# Patient Record
Sex: Female | Born: 1935 | Race: White | Hispanic: No | Smoking: Never smoker
Health system: Southern US, Community
[De-identification: ages and names within clinical notes are randomized; demographics above are authoritative.]

## PROBLEM LIST (undated history)

## (undated) DIAGNOSIS — H409 Unspecified glaucoma: Secondary | ICD-10-CM

## (undated) DIAGNOSIS — E119 Type 2 diabetes mellitus without complications: Secondary | ICD-10-CM

## (undated) DIAGNOSIS — C801 Malignant (primary) neoplasm, unspecified: Secondary | ICD-10-CM

## (undated) DIAGNOSIS — F419 Anxiety disorder, unspecified: Secondary | ICD-10-CM

## (undated) DIAGNOSIS — I10 Essential (primary) hypertension: Secondary | ICD-10-CM

## (undated) DIAGNOSIS — E0590 Thyrotoxicosis, unspecified without thyrotoxic crisis or storm: Secondary | ICD-10-CM

## (undated) DIAGNOSIS — E785 Hyperlipidemia, unspecified: Secondary | ICD-10-CM

## (undated) HISTORY — DX: Unspecified glaucoma: H40.9

## (undated) HISTORY — DX: Type 2 diabetes mellitus without complications: E11.9

## (undated) HISTORY — DX: Anxiety disorder, unspecified: F41.9

## (undated) HISTORY — DX: Thyrotoxicosis, unspecified without thyrotoxic crisis or storm: E05.90

## (undated) HISTORY — DX: Hyperlipidemia, unspecified: E78.5

## (undated) HISTORY — DX: Essential (primary) hypertension: I10

---

## 1974-11-18 HISTORY — PX: CHOLECYSTECTOMY: SHX55

## 1993-11-18 HISTORY — PX: ABDOMINAL HYSTERECTOMY: SHX81

## 1997-11-18 HISTORY — PX: ROTATOR CUFF REPAIR: SHX139

## 1998-03-29 ENCOUNTER — Other Ambulatory Visit: Admission: RE | Admit: 1998-03-29 | Discharge: 1998-03-29 | Payer: Self-pay | Admitting: Radiology

## 1998-04-20 ENCOUNTER — Other Ambulatory Visit: Admission: RE | Admit: 1998-04-20 | Discharge: 1998-04-20 | Payer: Self-pay | Admitting: Cardiology

## 1999-01-15 ENCOUNTER — Other Ambulatory Visit: Admission: RE | Admit: 1999-01-15 | Discharge: 1999-01-15 | Payer: Self-pay | Admitting: Obstetrics and Gynecology

## 1999-01-17 ENCOUNTER — Other Ambulatory Visit: Admission: RE | Admit: 1999-01-17 | Discharge: 1999-01-17 | Payer: Self-pay | Admitting: Obstetrics and Gynecology

## 1999-02-02 ENCOUNTER — Encounter: Payer: Self-pay | Admitting: Obstetrics and Gynecology

## 1999-02-06 ENCOUNTER — Inpatient Hospital Stay (HOSPITAL_COMMUNITY): Admission: RE | Admit: 1999-02-06 | Discharge: 1999-02-08 | Payer: Self-pay | Admitting: Obstetrics and Gynecology

## 2001-03-05 ENCOUNTER — Encounter: Payer: Self-pay | Admitting: Orthopedic Surgery

## 2001-03-12 ENCOUNTER — Inpatient Hospital Stay (HOSPITAL_COMMUNITY): Admission: RE | Admit: 2001-03-12 | Discharge: 2001-03-13 | Payer: Self-pay | Admitting: Orthopedic Surgery

## 2010-09-05 ENCOUNTER — Ambulatory Visit: Payer: Self-pay | Admitting: Cardiology

## 2010-10-18 ENCOUNTER — Encounter
Admission: RE | Admit: 2010-10-18 | Discharge: 2010-12-18 | Payer: Self-pay | Source: Home / Self Care | Attending: Cardiology | Admitting: Cardiology

## 2010-12-06 ENCOUNTER — Ambulatory Visit: Payer: Self-pay | Admitting: Cardiology

## 2010-12-13 ENCOUNTER — Ambulatory Visit
Admission: RE | Admit: 2010-12-13 | Discharge: 2010-12-13 | Payer: Self-pay | Source: Home / Self Care | Attending: Family Medicine | Admitting: Family Medicine

## 2010-12-13 ENCOUNTER — Other Ambulatory Visit: Payer: Self-pay | Admitting: Family Medicine

## 2010-12-13 DIAGNOSIS — I10 Essential (primary) hypertension: Secondary | ICD-10-CM | POA: Insufficient documentation

## 2010-12-13 DIAGNOSIS — F411 Generalized anxiety disorder: Secondary | ICD-10-CM | POA: Insufficient documentation

## 2010-12-13 DIAGNOSIS — M109 Gout, unspecified: Secondary | ICD-10-CM | POA: Insufficient documentation

## 2010-12-13 DIAGNOSIS — E039 Hypothyroidism, unspecified: Secondary | ICD-10-CM | POA: Insufficient documentation

## 2010-12-13 DIAGNOSIS — E119 Type 2 diabetes mellitus without complications: Secondary | ICD-10-CM | POA: Insufficient documentation

## 2010-12-14 LAB — BASIC METABOLIC PANEL
BUN: 28 mg/dL — ABNORMAL HIGH (ref 6–23)
CO2: 28 mEq/L (ref 19–32)
Calcium: 10.2 mg/dL (ref 8.4–10.5)
Chloride: 98 mEq/L (ref 96–112)
Creatinine, Ser: 1.4 mg/dL — ABNORMAL HIGH (ref 0.4–1.2)
GFR: 36.99 mL/min — ABNORMAL LOW (ref >60.00)
Glucose, Bld: 128 mg/dL — ABNORMAL HIGH (ref 70–99)
Potassium: 4.6 mEq/L (ref 3.5–5.1)
Sodium: 136 mEq/L (ref 135–145)

## 2010-12-14 LAB — HEMOGLOBIN A1C: Hgb A1c MFr Bld: 7.2 % — ABNORMAL HIGH (ref 4.6–6.5)

## 2010-12-14 LAB — TSH: TSH: 0.31 u[IU]/mL — ABNORMAL LOW (ref 0.35–5.50)

## 2010-12-17 ENCOUNTER — Telehealth (INDEPENDENT_AMBULATORY_CARE_PROVIDER_SITE_OTHER): Payer: Self-pay | Admitting: *Deleted

## 2010-12-17 LAB — T3, FREE: T3, Free: 2.5 pg/mL (ref 2.3–4.2)

## 2010-12-17 LAB — T4, FREE: Free T4: 1 ng/dL (ref 0.60–1.60)

## 2010-12-26 NOTE — Progress Notes (Signed)
Summary: labs  Phone Note Outgoing Call   Call placed by: Malachi Bonds CMA,  December 17, 2010 2:55 PM Call placed to: Patient Summary of Call: despite abnormal TSH, T3/T4 are normal.  no changes to meds at this time.  A1C is elevated.  should decrease Amaryl (Glimiperide) to 2 mg and add Januvia 50mg  daily to better control sugars (we have a free 30 day voucher)  labs show mild kidney problems- not uncommon w/ age.  will follow.  Follow-up for Phone Call        spoke w/ patient aware of labs but stated that she was taking 2 tablets of glimepiride so informed patient to take 1 tablet and will mail copy of labs also mail discount card for patient to have and prescription sent to pharmacy ...........Marland KitchenMalachi Bonds CMA  December 17, 2010 3:01 PM     New/Updated Medications: JANUVIA 50 MG TABS (SITAGLIPTIN PHOSPHATE) take one tablet daily Prescriptions: JANUVIA 50 MG TABS (SITAGLIPTIN PHOSPHATE) take one tablet daily  #30 x 3   Entered by:   Malachi Bonds CMA   Authorized by:   Annye Asa MD   Signed by:   Malachi Bonds CMA on 12/17/2010   Method used:   Electronically to        [facility] (retail)       [address]       [place], [place]  [address]       Ph: [phone]       Fax: [phone]   RxID:   [id number]

## 2010-12-26 NOTE — Assessment & Plan Note (Signed)
Summary: new to est//medicare//lch   Vital Signs:  Patient profile:   77 year old female Height:      66 inches Weight:      182 pounds BMI:     29.48 Pulse rate:   68 / minute BP sitting:   120 / 72  (left arm)  Vitals Entered By: Malachi Bonds CMA (December 13, 2010 3:04 PM) CC: NEW EST- problems w/ nerves    History of Present Illness: 77 yo woman here today to establish care.  previous MD- Mare Ferrari, still serves as cardiologist.  HTN- well controlled on current meds.  no CP, SOB, HAs, visual changes, edema.  DM- Dr Mare Ferrari told pt to 'watch sugars' and then nurse told pt she might need insulin.  pt has been very worried about this.  has seen a nutritionist, was given a diet.  is on Glimiperide 2 pills daily.  pt reports CBGs fluctuate considerably.  does admit to waking up dizzy and weak.  has been motivated to follow her diet, has lost 16 lbs.  Anxiety- daughter recently died, very anxious.  has been worried about her blood sugar.  not taking Alprazolam three times a day- only taking at night.  reports it makes her 'too drugged'.  would like to know if there is another option.  Hypothyroid- on levothyroxine.  Hyperlipidemia- on Atorvastatin, no abd pain/N/V/D  Preventive Screening-Counseling & Management  Caffeine-Diet-Exercise     Diet Comments: ADA diet  Current Medications (verified): 1)  Allopurinol 100 Mg Tabs (Allopurinol) .... Take One Tablet Daily 2)  Acebutolol Hcl 400 Mg Caps (Acebutolol Hcl) .... Take One Tablet Daily 3)  Amlodipine Besylate 5 Mg Tabs (Amlodipine Besylate) .... Take One Tablet Daily 4)  Atorvastatin Calcium 10 Mg Tabs (Atorvastatin Calcium) .... Take One Tablet Daily 5)  Glimepiride 4 Mg Tabs (Glimepiride) .... Take One Tablet Daily 6)  Levothyroxine Sodium 100 Mcg Tabs (Levothyroxine Sodium) .... Take One Tablet Daily 7)  Metolazone 2.5 Mg Tabs (Metolazone) .... 1/2 Every 3 Days 8)  Micardis Hct 80-12.5 Mg Tabs (Telmisartan-Hctz) .... Take  One Tablet Daily 9)  Alprazolam 0.5 Mg Tabs (Alprazolam) .... Take One Tablet Three Times A Day 10)  Klonopin 0.5 Mg Tabs (Clonazepam) .[name].. 1 Tab By Mouth Three Times A Day For Anxiety  Allergies (verified): No Known Drug Allergies  Past History:  Past Medical History: Diabetes mellitus, type II Gout Hyperlipidemia Hypertension Hyperthyroidism Anxiety  Past Surgical History: Cholecystectomy Hysterectomy R rotator cuff surgery  Family History: CAD-no HTN-no DM-no STROKE-no COLON CA-no BREAST CA-no  Social History: widowed. daughter helps pt considerably  Review of Systems      See HPI  Physical Exam  General:  Well-developed,well-nourished,in no acute distress; alert,appropriate and cooperative throughout examination Head:  Normocephalic and atraumatic without obvious abnormalities. No apparent alopecia or balding. Ears:  hard of hearing Neck:  No deformities, masses, or tenderness noted. Lungs:  Normal respiratory effort, chest expands symmetrically. Lungs are clear to auscultation, no crackles or wheezes. Heart:  distant S1/S2, regular, no murmur appreciated Abdomen:  soft, NT/ND, +BS Pulses:  +2 carotid, radial, DP Extremities:  nonpitting edema of LEs bilaterally Neurologic:  alert & oriented X3 and cranial nerves II-XII intact.   Skin:  turgor normal and color normal.   Cervical Nodes:  No lymphadenopathy noted Psych:  Cognition and judgment appear intact. Alert and cooperative with normal attention span and concentration. No apparent delusions, illusions, hallucinations   Impression & Recommendations:  Problem # 1:  DIABETES MELLITUS, TYPE II (ICD-250.00) Assessment New will need to review records from previous MD.  check labs to assess A1C.  may need to modify meds based on pt's report of fluctuating CBGs and occasional symptomatic lows.  UTD on eye exam.  on ARB.  lipid goal is <70.  pt not fasting today. Her updated medication list for this problem  includes:    Glimepiride 4 Mg Tabs (Glimepiride) .[name]... Take one tablet daily    Micardis Hct 80-12.5 Mg Tabs (Telmisartan-hctz) .[name]... Take one tablet daily  Problem # 2:  HYPERTENSION (ICD-401.9) Assessment: New well controlled on current meds.  asymptomatic at this time. Her updated medication list for this problem includes:    Acebutolol Hcl 400 Mg Caps (Acebutolol hcl) .[name]... Take one tablet daily    Amlodipine Besylate 5 Mg Tabs (Amlodipine besylate) .[name]... Take one tablet daily    Metolazone 2.5 Mg Tabs (Metolazone) .[name]... 1/2 every 3 days    Micardis Hct 80-12.5 Mg Tabs (Telmisartan-hctz) .[name]... Take one tablet daily  Problem # 3:  ANXIETY (ICD-300.00) Assessment: New since xanax makes pt feel 'too drugged' will switch to longer acting klonopin three times a day to control anxiety.  reviewed possible side effects of meds including confusion, falls- pt and daughter aware. Her updated medication list for this problem includes:    Alprazolam 0.5 Mg Tabs (Alprazolam) .[name]... Take one tablet three times a day    Klonopin 0.5 Mg Tabs (Clonazepam) .[name]... 1 tab by mouth three times a day for anxiety  Problem # 4:  HYPOTHYROIDISM (ICD-244.9) Assessment: New due for labs.  adjust meds as needed. Her updated medication list for this problem includes:    Levothyroxine Sodium 100 Mcg Tabs (Levothyroxine sodium) .[name]... Take one tablet daily  Orders: Specimen Handling (99000) TLB-TSH (Thyroid Stimulating Hormone) (84443-TSH)  Problem # 5:  HYPERLIPIDEMIA (ICD-272.4) Assessment: New will need records from previous MD.  goal is < 70 given DM.  not fasting today. Her updated medication list for this problem includes:    Atorvastatin Calcium 10 Mg Tabs (Atorvastatin calcium) .[name]... Take one tablet daily  Complete Medication List: 1)  Allopurinol 100 Mg Tabs (Allopurinol) .... Take one tablet daily 2)  Acebutolol Hcl 400 Mg Caps (Acebutolol hcl) .... Take one tablet daily 3)  Amlodipine Besylate 5 Mg  Tabs (Amlodipine besylate) .... Take one tablet daily 4)  Atorvastatin Calcium 10 Mg Tabs (Atorvastatin calcium) .... Take one tablet daily 5)  Glimepiride 4 Mg Tabs (Glimepiride) .... Take one tablet daily 6)  Levothyroxine Sodium 100 Mcg Tabs (Levothyroxine sodium) .... Take one tablet daily 7)  Metolazone 2.5 Mg Tabs (Metolazone) .... 1/2 every 3 days 8)  Micardis Hct 80-12.5 Mg Tabs (Telmisartan-hctz) .... Take one tablet daily 9)  Alprazolam 0.5 Mg Tabs (Alprazolam) .... Take one tablet three times a day 10)  Klonopin 0.5 Mg Tabs (Clonazepam) .[name].. 1 tab by mouth three times a day for anxiety  Other Orders: Venipuncture [id number]) TLB-A1C / Hgb A1C (Glycohemoglobin) (83036-A1C) TLB-BMP (Basic Metabolic Panel-BMET) (99991111)  Patient Instructions: 1)  Follow up in 3 months to recheck diabetes 2)  We'll notify you of your lab results and make any changes to meds if needed 3)  Call with any questions or concerns 4)  Start the Klonopin three times a day for anxiety 5)  Welcome!  We're glad to have you! Prescriptions: KLONOPIN 0.5 MG TABS (CLONAZEPAM) 1 tab by mouth three times a day for anxiety  #90 x 3  Entered and Authorized by:   Annye Asa MD   Signed by:   Annye Asa MD on 12/13/2010   Method used:   Print then Give to Patient   RxID:   [id number]    Orders Added: 1)  Venipuncture [id number] 2)  Specimen Handling [id number])  TLB-A1C / Hgb A1C (Glycohemoglobin) [83036-A1C] 4)  TLB-BMP (Basic Metabolic Panel-BMET) [id number] 5)  TLB-TSH (Thyroid Stimulating Hormone) [84443-TSH] 6)  New Patient Level III [id number]

## 2011-03-05 ENCOUNTER — Encounter: Payer: Self-pay | Admitting: Family Medicine

## 2011-03-14 ENCOUNTER — Encounter: Payer: Self-pay | Admitting: Family Medicine

## 2011-03-14 ENCOUNTER — Ambulatory Visit (INDEPENDENT_AMBULATORY_CARE_PROVIDER_SITE_OTHER): Payer: Medicare Other | Admitting: Family Medicine

## 2011-03-14 DIAGNOSIS — I10 Essential (primary) hypertension: Secondary | ICD-10-CM

## 2011-03-14 DIAGNOSIS — E785 Hyperlipidemia, unspecified: Secondary | ICD-10-CM

## 2011-03-14 DIAGNOSIS — E039 Hypothyroidism, unspecified: Secondary | ICD-10-CM

## 2011-03-14 DIAGNOSIS — E119 Type 2 diabetes mellitus without complications: Secondary | ICD-10-CM

## 2011-03-14 LAB — BASIC METABOLIC PANEL
BUN: 21 mg/dL (ref 6–23)
Chloride: 98 mEq/L (ref 96–112)
Creatinine, Ser: 1.4 mg/dL — ABNORMAL HIGH (ref 0.4–1.2)
GFR: 37.58 mL/min — ABNORMAL LOW (ref >60.00)
Glucose, Bld: 156 mg/dL — ABNORMAL HIGH (ref 70–99)

## 2011-03-14 LAB — TSH: TSH: 0.22 u[IU]/mL — ABNORMAL LOW (ref 0.35–5.50)

## 2011-03-14 LAB — HEPATIC FUNCTION PANEL
ALT: 17 U/L (ref 0–35)
Bilirubin, Direct: 0.1 mg/dL (ref 0.0–0.3)
Total Bilirubin: 1 mg/dL (ref 0.3–1.2)

## 2011-03-14 LAB — T3, FREE: T3, Free: 2.6 pg/mL (ref 2.3–4.2)

## 2011-03-14 LAB — LIPID PANEL
Total CHOL/HDL Ratio: 3
VLDL: 25.6 mg/dL (ref 0.0–40.0)

## 2011-03-14 LAB — T4, FREE: Free T4: 1.07 ng/dL (ref 0.60–1.60)

## 2011-03-14 MED ORDER — SITAGLIPTIN PHOSPHATE 50 MG PO TABS: 50.0000 mg | ORAL_TABLET | Freq: Every day | ORAL | Status: DC

## 2011-03-14 NOTE — Progress Notes (Signed)
  Subjective:    Patient ID: [name], female    DOB: 1919-02-18, 77 y.o.   MRN: [id number]  HPI DM- reports CBGs have 'come down, that med works good'.  Taking sugars once daily- fasting CBGs ranging 100-110s.  Fasting #s used to be 160-170s.  Following 30 carb diet from nutritionist.  Denies symptomatic lows  HTN- BP well controlled today, no CP, SOB, HAs, visual changes, edema.  Hypothyroid- TSH was abnormal previously but T3 and T4 were good.  Denies sxs.  Due for lab recheck today.   Review of Systems For ROS see HPI     Objective:   Physical Exam  Constitutional: She is oriented to person, place, and time. She appears well-developed and well-nourished. No distress.  HENT:  Head: Normocephalic and atraumatic.  Eyes: Conjunctivae and EOM are normal. Pupils are equal, round, and reactive to light.  Neck: Normal range of motion. Neck supple. No thyromegaly present.  Cardiovascular: Normal rate, regular rhythm, normal heart sounds and intact distal pulses.   No murmur heard. Pulmonary/Chest: Effort normal and breath sounds normal. No respiratory distress.  Abdominal: Soft. She exhibits no distension. There is no tenderness.  Musculoskeletal: She exhibits no edema.  Lymphadenopathy:    She has no cervical adenopathy.  Neurological: She is alert and oriented to person, place, and time.  Skin: Skin is warm and dry.  Psychiatric: She has a normal mood and affect. Her behavior is normal.          Assessment & Plan:

## 2011-03-14 NOTE — Patient Instructions (Signed)
Follow up in 4 months to recheck diabetes and blood pressure We'll notify you of your lab results Keep up the good work- you look fantastic! Call with any questions or concerns Have a great spring!

## 2011-03-18 ENCOUNTER — Other Ambulatory Visit (INDEPENDENT_AMBULATORY_CARE_PROVIDER_SITE_OTHER): Payer: Medicare Other

## 2011-03-18 DIAGNOSIS — E119 Type 2 diabetes mellitus without complications: Secondary | ICD-10-CM

## 2011-03-24 NOTE — Assessment & Plan Note (Signed)
Due for labs today.  Tolerating statin w/out difficulty.  Adjust meds prn.

## 2011-03-24 NOTE — Assessment & Plan Note (Signed)
BP well controlled.  Asymptomatic.  Doing well on current meds.  No changes.

## 2011-03-24 NOTE — Assessment & Plan Note (Signed)
Pt's TSH was abnormal at last check, but T3/T4 normal.  Due for recheck.

## 2011-03-24 NOTE — Assessment & Plan Note (Signed)
Pt is doing well since starting Januvia, reports CBGs have improved.  Due for labs today.  Applauded her attention to diet and exercise.  Will adjust meds prn.

## 2011-04-05 NOTE — H&P (Signed)
[facility]  Patient:    [name], [name]                          MRN: [id number] Adm. Date:  03/12/01 Attending:  Kipp Brood. Gladstone Lighter, M.D. Dictator:   Jenetta Loges, P.A. CC:         Joyice Faster. Mare Ferrari, M.D.   History and Physical  DATE OF HISTORY AND PHYSICAL:  March 05, 2001.  CHIEF COMPLAINT:  Right shoulder pain.  HISTORY OF PRESENT ILLNESS:  [name] is a very pleasant 77 year old female who has had chronic ongoing right shoulder pain.  She has failed conservative measures and was sent for MRI which demonstrated a full-thickness rotator cuff tear.  Patient is having significant disability to her shoulder.  She has noted weakness on exam with inability to completely abduct her arm.  With these findings, it is explained that surgical intervention is indicated for repair of this.  Risks and benefits were discussed with patient and she is in agreement and wishes to proceed.  Patient did see her primary physician, Dr. Joyice Faster. [place], for medical clearance.  PAST MEDICAL HISTORY: 1. Hypertension. 2. Hypothyroidism. 3. Gout. 4. Borderline diabetes. 5. Hypercholesterolemia.  PAST SURGICAL HISTORY: 1. Cholecystectomy. 2. Hysterectomy.  MEDICATIONS: 1. Acebutolol 400 mg q.d. 2. Zaroxolyn 25 mg q.d. 3. Levothyroxine 25 mg q.d. 4. Spironolactone 25 mg q.d. 5. Allopurinol 100 mg q.d. 6. Lipitor 20 mg q.d. 7. Premarin 0.25 mg q.d. 8. Cozaar 50 mg q.d. 9. Glucotrol XL 5 mg one-half q.d.  ALLERGIES:  No known drug allergies.  SOCIAL HISTORY:  She does not drink nor smoke.  She is left-hand dominant. Lives with her husband and her daughter lives next door that will assist her postoperatively.  FAMILY HISTORY:  Family history negative for diabetes, cancers, heart attacks.  REVIEW OF SYSTEMS:  Patient denies any recent fevers, chills, night sweats. No bleeding tendency.  CNS:  No blurred or double vision, seizures, headache or paralysis.   RESPIRATORY:  No shortness of breath, productive cough or hemoptysis.  CARDIOVASCULAR:  No chest pain, angina or orthopnea.  GI:  No nausea, vomiting, constipation, melena or bloody stools.  GU:  No dysuria or hematuria.  MUSCULOSKELETAL:  As pertinent to present illness.  PHYSICAL EXAMINATION:  GENERAL:  Well-developed, well-nourished 77 year old female.  VITAL SIGNS:  Blood pressure is 152/84, pulse 88 and regular, respirations 14.  HEENT:  Normocephalic.  Extraocular motions intact.  NECK:  Neck supple.  No lymphadenopathy.  No carotid bruits.  CHEST:  Chest clear to auscultation bilaterally.  No rales or rhonchi.  HEART:  Heart is regular rate and rhythm.  No murmurs, gallops, rubs, heaves or thrills.  ABDOMEN:  Abdomen soft and nontender.  No hepatosplenomegaly.  EXTREMITIES:  Right shoulder has a decreased active motion.  She is unable to completely abduct the arm.  Neurovascularly, she is intact.  IMPRESSION: 1. Right rotator cuff tear. 2. Hypertension. 3. Hypothyroidism. 4. Gout. 5. Hypercholesterolemia. 6. Borderline diabetes.  PLAN:  Patient will be admitted for repair of right rotator cuff repair.  On preadmission labs, her BUN and creatinine were elevated at 30 and 1.9.  I called Dr. Bobbye Riggs office and back in February, she was 24 and 1.5.  They will follow up with her on this. DD:  03/06/01 TD:  03/09/01 Job: [id number]

## 2011-04-05 NOTE — Op Note (Signed)
. [facility]  Patient:    [name], [name]                   MRN: [id number] Proc. Date: 03/12/01 Adm. Date:  [id number] Attending:  Ferrel Logan                           Operative Report  PREOPERATIVE DIAGNOSIS: 1. Partial rotator cuff tendon tear on the right. 2. Severe impingement syndrome on the right.  POSTOPERATIVE DIAGNOSIS: 1. Partial rotator cuff tendon tear on the right. 2. Severe impingement syndrome on the right.  OPERATION PERFORMED: 1. Partial acromionectomy and acromioplasty on the right. 2. Repair of a small irregularity of the rotator cuff tendon on the right    utilizing transverse sutures. 3. Subdeltoid bursectomy.  SURGEON:  Kipp Brood. Gladstone Lighter, M.D.  ASSISTANT:  Peter Congo, P.A.  ANESTHESIA:  General.  DESCRIPTION OF PROCEDURE:  Under general anesthesia, routine orthopedic prepping and draping of the right shoulder was carried out.  The patient had 1 gm of IV Ancef.  An incision was made over the anterior aspect of the right shoulder.  Bleeders were identified and cauterized.  At this time the self-retaining retractors were inserted.  I then detached the deltoid tendon by sharp dissection from the acromion.  I then split the deltoid muscle proximally, went down, identified the acromion, she had a large overhanging acromion that literally was imbedding into her rotator cuff.  I protected the cuff with a Bennett retractor, utilized the oscillating saw and did a partial acromionectomy.  I then utilized the bur to bur down the undersurface of the acromion.  We now had a nice open space for motion of the shoulder.  I thoroughly irrigated out the area.  I then put the few transverse sutures in her rotator cuff which was markedly thin and irregular at its attachment.  No anchors were necessary.  We thoroughly irrigated out the area.  I bone waxed the raw bone end of the acromion and then I inserted some Gelfoam  for hemostasis purposes up under the acromion.  I then reattached the deltoid tendon and muscle in the usual fashion.  The remaining part of the wound was closed in the usual fashion.  The skin was closed with metal staples and sterile Neosporin dressing was applied.  She was then placed in a shoulder immobilizer. DD:  03/12/01 TD:  03/12/01 Job: [id number]

## 2011-07-02 ENCOUNTER — Ambulatory Visit (INDEPENDENT_AMBULATORY_CARE_PROVIDER_SITE_OTHER): Payer: Medicare Other | Admitting: Family Medicine

## 2011-07-02 ENCOUNTER — Encounter: Payer: Self-pay | Admitting: Family Medicine

## 2011-07-02 DIAGNOSIS — E119 Type 2 diabetes mellitus without complications: Secondary | ICD-10-CM

## 2011-07-02 DIAGNOSIS — I10 Essential (primary) hypertension: Secondary | ICD-10-CM

## 2011-07-02 LAB — HEMOGLOBIN A1C: Hgb A1c MFr Bld: 5.9 % (ref 4.6–6.5)

## 2011-07-02 LAB — BASIC METABOLIC PANEL
BUN: 20 mg/dL (ref 6–23)
Creatinine, Ser: 1.3 mg/dL — ABNORMAL HIGH (ref 0.4–1.2)
GFR: 39.51 mL/min — ABNORMAL LOW (ref >60.00)
Glucose, Bld: 145 mg/dL — ABNORMAL HIGH (ref 70–99)

## 2011-07-02 NOTE — Assessment & Plan Note (Signed)
Per pt report this is excellently controlled.  Check A1C to assess.  Tolerating current meds w/out difficulty- will adjust prn.

## 2011-07-02 NOTE — Patient Instructions (Signed)
Schedule your complete physical in 4 months- do not eat before this appt We'll notify you of your lab results Keep up the good work!  You look great! Call with any questions or concerns Hang in there!!!

## 2011-07-02 NOTE — Progress Notes (Signed)
  Subjective:    Patient ID: [name], female    DOB: 02-Sep-1919, 77 y.o.   MRN: [id number]  HPI DM- chronic problem for pt, CBGs have been good- 'rarely go over 120'.  On Januvia, amaryl.  Denies symptomatic lows.  No numbness or tingling of hands or feet.  No N/V/D.  HTN- chronic problem for pt, excellent control today.  On Norvasc, Micardis.  No CP, SOB, HAs, visual changes, edema.   Review of Systems For ROS see HPI     Objective:   Physical Exam  Vitals reviewed. Constitutional: She is oriented to person, place, and time. She appears well-developed and well-nourished. No distress.  HENT:  Head: Normocephalic and atraumatic.  Eyes: Conjunctivae and EOM are normal. Pupils are equal, round, and reactive to light.  Neck: Normal range of motion. Neck supple.  Cardiovascular: Normal rate, regular rhythm, normal heart sounds and intact distal pulses.   No murmur heard. Pulmonary/Chest: Effort normal and breath sounds normal. No respiratory distress.  Abdominal: Soft. She exhibits no distension. There is no tenderness.  Musculoskeletal: She exhibits no edema.  Lymphadenopathy:    She has no cervical adenopathy.  Neurological: She is alert and oriented to person, place, and time.  Skin: Skin is warm and dry.  Psychiatric: She has a normal mood and affect. Her behavior is normal.          Assessment & Plan:

## 2011-07-02 NOTE — Assessment & Plan Note (Signed)
Excellent control- asymptomatic.  Check labs.  No changes.

## 2011-07-03 ENCOUNTER — Other Ambulatory Visit: Payer: Self-pay | Admitting: Family Medicine

## 2011-07-03 ENCOUNTER — Telehealth: Payer: Self-pay | Admitting: *Deleted

## 2011-07-03 ENCOUNTER — Telehealth: Payer: Self-pay

## 2011-07-03 DIAGNOSIS — E875 Hyperkalemia: Secondary | ICD-10-CM

## 2011-07-03 NOTE — Telephone Encounter (Signed)
Error

## 2011-07-03 NOTE — Telephone Encounter (Signed)
Pt notified and verbalized understanding. Lab order and appointment placed.

## 2011-07-03 NOTE — Telephone Encounter (Signed)
Message copied by Santiago Bumpers on Wed Jul 03, 2011  2:43 PM ------      Message from: Midge Minium      Created: Tue Jul 02, 2011  7:36 PM       A1C is excellent!  K+ is mildly elevated- should increase water intake, limit K+ in diet (bananas, citrus fruits, leafy greens) and recheck on Thurs (dx hyperkalemia).  Remainder of labs are stable

## 2011-07-04 ENCOUNTER — Telehealth: Payer: Self-pay

## 2011-07-04 ENCOUNTER — Other Ambulatory Visit (INDEPENDENT_AMBULATORY_CARE_PROVIDER_SITE_OTHER): Payer: Medicare Other

## 2011-07-04 DIAGNOSIS — E875 Hyperkalemia: Secondary | ICD-10-CM

## 2011-07-04 NOTE — Telephone Encounter (Signed)
Message copied by Santiago Bumpers on Thu Jul 04, 2011  3:53 PM ------      Message from: Midge Minium      Created: Thu Jul 04, 2011  3:10 PM       K+ is now normal

## 2011-07-04 NOTE — Telephone Encounter (Signed)
Lab mailed

## 2011-07-04 NOTE — Progress Notes (Signed)
Labs only

## 2011-07-08 ENCOUNTER — Other Ambulatory Visit: Payer: Self-pay | Admitting: Family Medicine

## 2011-07-09 NOTE — Telephone Encounter (Signed)
Last ov 07/02/11. Last filled 04/08/11

## 2011-07-10 ENCOUNTER — Other Ambulatory Visit: Payer: Self-pay | Admitting: Family Medicine

## 2011-07-10 NOTE — Telephone Encounter (Signed)
Rx verbally given, per pharmacy Rx not received

## 2011-07-10 NOTE — Telephone Encounter (Signed)
Duplicate done [id number]

## 2011-09-24 ENCOUNTER — Telehealth: Payer: Self-pay | Admitting: Family Medicine

## 2011-09-24 MED ORDER — TELMISARTAN-HCTZ 80-12.5 MG PO TABS: 1.0000 | ORAL_TABLET | Freq: Every day | ORAL | Status: DC

## 2011-09-24 NOTE — Telephone Encounter (Signed)
Printed rx for micardis for MD to sign to be mailed to pt per request.

## 2011-09-24 NOTE — Telephone Encounter (Signed)
Patient needs refill for micardis 80/12.5 mg 90 day supply - mail rx to patient she will mail to pharmacy

## 2011-09-25 ENCOUNTER — Other Ambulatory Visit: Payer: Self-pay | Admitting: *Deleted

## 2011-09-25 NOTE — Telephone Encounter (Signed)
MD signed Micardis medication and mailed 09-25-11 to home address

## 2011-09-26 ENCOUNTER — Other Ambulatory Visit: Payer: Self-pay

## 2011-10-01 ENCOUNTER — Other Ambulatory Visit: Payer: Self-pay | Admitting: Radiology

## 2011-10-01 DIAGNOSIS — C50911 Malignant neoplasm of unspecified site of right female breast: Secondary | ICD-10-CM

## 2011-10-02 ENCOUNTER — Encounter: Payer: Self-pay | Admitting: Family Medicine

## 2011-10-03 ENCOUNTER — Other Ambulatory Visit: Payer: Self-pay | Admitting: *Deleted

## 2011-10-03 ENCOUNTER — Telehealth: Payer: Self-pay | Admitting: *Deleted

## 2011-10-03 DIAGNOSIS — C50919 Malignant neoplasm of unspecified site of unspecified female breast: Secondary | ICD-10-CM

## 2011-10-03 NOTE — Telephone Encounter (Signed)
Confirmed [facility] for 10/09/11 at 1215 .  Instructions and contact information given.

## 2011-10-07 ENCOUNTER — Ambulatory Visit
Admission: RE | Admit: 2011-10-07 | Discharge: 2011-10-07 | Disposition: A | Discharge status: Home / Self Care | Payer: Medicare Other | Source: Ambulatory Visit | Attending: Radiology | Admitting: Radiology

## 2011-10-07 DIAGNOSIS — C50911 Malignant neoplasm of unspecified site of right female breast: Secondary | ICD-10-CM

## 2011-10-07 MED ORDER — GADOBENATE DIMEGLUMINE 529 MG/ML IV SOLN
14.0000 mL | Freq: Once | INTRAVENOUS | Status: AC | PRN
  Administered 2011-10-07: 14 mL via INTRAVENOUS

## 2011-10-09 ENCOUNTER — Telehealth: Payer: Self-pay | Admitting: Oncology

## 2011-10-09 ENCOUNTER — Encounter (INDEPENDENT_AMBULATORY_CARE_PROVIDER_SITE_OTHER): Payer: Self-pay | Admitting: Surgery

## 2011-10-09 ENCOUNTER — Ambulatory Visit: Payer: Medicare Other | Admitting: Physical Therapy

## 2011-10-09 ENCOUNTER — Other Ambulatory Visit (HOSPITAL_BASED_OUTPATIENT_CLINIC_OR_DEPARTMENT_OTHER): Payer: Medicare Other | Admitting: Lab

## 2011-10-09 ENCOUNTER — Ambulatory Visit (HOSPITAL_BASED_OUTPATIENT_CLINIC_OR_DEPARTMENT_OTHER): Payer: Medicare Other | Admitting: Surgery

## 2011-10-09 ENCOUNTER — Ambulatory Visit
Admission: RE | Admit: 2011-10-09 | Discharge: 2011-10-09 | Disposition: A | Discharge status: Home / Self Care | Payer: Medicare Other | Source: Ambulatory Visit | Attending: Radiation Oncology | Admitting: Radiation Oncology

## 2011-10-09 ENCOUNTER — Ambulatory Visit (HOSPITAL_BASED_OUTPATIENT_CLINIC_OR_DEPARTMENT_OTHER): Payer: Medicare Other | Admitting: Oncology

## 2011-10-09 ENCOUNTER — Ambulatory Visit: Payer: Medicare Other

## 2011-10-09 VITALS — BP 152/77 | HR 69

## 2011-10-09 VITALS — BP 152/77 | HR 69 | Temp 97.9°F | Ht 65.5 in | Wt 157.1 lb

## 2011-10-09 DIAGNOSIS — C50911 Malignant neoplasm of unspecified site of right female breast: Secondary | ICD-10-CM

## 2011-10-09 DIAGNOSIS — Z170 Estrogen receptor positive status [ER+]: Secondary | ICD-10-CM

## 2011-10-09 DIAGNOSIS — C50919 Malignant neoplasm of unspecified site of unspecified female breast: Secondary | ICD-10-CM

## 2011-10-09 DIAGNOSIS — C50119 Malignant neoplasm of central portion of unspecified female breast: Secondary | ICD-10-CM

## 2011-10-09 DIAGNOSIS — C50811 Malignant neoplasm of overlapping sites of right female breast: Secondary | ICD-10-CM | POA: Insufficient documentation

## 2011-10-09 LAB — COMPREHENSIVE METABOLIC PANEL
ALT: 14 U/L (ref 0–35)
AST: 18 U/L (ref 0–37)
Albumin: 4 g/dL (ref 3.5–5.2)
Alkaline Phosphatase: 77 U/L (ref 39–117)
Calcium: 10.5 mg/dL (ref 8.4–10.5)
Chloride: 101 mEq/L (ref 96–112)
Creatinine, Ser: 1.26 mg/dL — ABNORMAL HIGH (ref 0.50–1.10)
Potassium: 3.7 mEq/L (ref 3.5–5.3)

## 2011-10-09 LAB — CBC WITH DIFFERENTIAL/PLATELET
Eosinophils Absolute: 0.1 10*3/uL (ref 0.0–0.5)
MCV: 95.6 fL (ref 79.5–101.0)
MONO#: 0.5 10*3/uL (ref 0.1–0.9)
MONO%: 6.8 % (ref 0.0–14.0)
NEUT#: 4.7 10*3/uL (ref 1.5–6.5)
RBC: 4.08 10*6/uL (ref 3.70–5.45)
RDW: 12.9 % (ref 11.2–14.5)
WBC: 6.8 10*3/uL (ref 3.9–10.3)

## 2011-10-09 LAB — CANCER ANTIGEN 27.29: CA 27.29: 26 U/mL (ref 0–39)

## 2011-10-09 NOTE — Telephone Encounter (Signed)
Gv pt appt for [id number]

## 2011-10-09 NOTE — Progress Notes (Signed)
Patient ID: [name], female   DOB: 1919/10/22, 77 y.o.   MRN: [id number]  Chief Complaint  Patient presents with  . Other    right breast cancer    HPI [name] is a 77 y.o. female.   HPI She is referred here by Dr. Elige Radon for evaluation of a recent right breast mass which was biopsied and proven to be a malignancy. She has no complaints regarding her breast. She denies nipple discharge. She reports having had a biopsy of the right breast and maybe more than 30 years ago for benign disease. She is otherwise without complaints. Past Medical History  Diagnosis Date  . Diabetes mellitus, type 2   . Gout   . Hyperlipidemia   . Hypertension   . Hyperthyroidism   . Anxiety     Past Surgical History  Procedure Date  . Cholecystectomy   . Rotator cuff repair     right  . Abdominal hysterectomy     History reviewed. No pertinent family history.  Social History History  Substance Use Topics  . Smoking status: Never Smoker   . Smokeless tobacco: Not on file  . Alcohol Use: Not on file    No Known Allergies  Current Outpatient Prescriptions  Medication Sig Dispense Refill  . acebutolol (SECTRAL) 400 MG capsule Take 400 mg by mouth daily.        [name] allopurinol (ZYLOPRIM) 100 MG tablet Take 100 mg by mouth daily.        [name] ALPRAZolam (XANAX) 0.5 MG tablet Take 0.5 mg by mouth at bedtime.       [name] amLODipine (NORVASC) 5 MG tablet Take 5 mg by mouth daily.        [name] atorvastatin (LIPITOR) 10 MG tablet Take 10 mg by mouth daily.        . clonazePAM (KLONOPIN) 0.5 MG tablet TAKE ONE TABLET BY MOUTH THREE TIMES DAILY FOR ANXIETY  90 tablet  3  . glimepiride (AMARYL) 4 MG tablet Take 4 mg by mouth daily.        [name] levothyroxine (SYNTHROID, LEVOTHROID) 100 MCG tablet Take 100 mcg by mouth daily.        . metolazone (ZAROXOLYN) 2.5 MG tablet Take 2.5 mg by mouth as directed. 0.5 tabs po every 3 days       . sitaGLIPtan (JANUVIA) 50 MG tablet Take 1 tablet (50 mg  total) by mouth daily.  90 tablet  3  . telmisartan-hydrochlorothiazide (MICARDIS HCT) 80-12.5 MG per tablet Take 1 tablet by mouth daily.  90 tablet  1    Review of Systems Review of Systems  Constitutional: Negative for fever, chills and unexpected weight change.  HENT: Negative for hearing loss, congestion, sore throat, trouble swallowing and voice change.   Eyes: Negative for visual disturbance.  Respiratory: Negative for cough and wheezing.   Cardiovascular: Negative for chest pain, palpitations and leg swelling.  Gastrointestinal: Negative for nausea, vomiting, abdominal pain, diarrhea, constipation, blood in stool, abdominal distention and anal bleeding.  Genitourinary: Negative for hematuria, vaginal bleeding and difficulty urinating.  Musculoskeletal: Positive for back pain. Negative for arthralgias.  Skin: Negative for rash and wound.  Neurological: Negative for seizures, syncope and headaches.  Hematological: Negative for adenopathy. Does not bruise/bleed easily.  Psychiatric/Behavioral: Negative for confusion.    Blood pressure 152/77, pulse 69.  Physical Exam Physical Exam  Constitutional: She is oriented to person, place, and time. She appears well-developed and well-nourished. No distress.  HENT:  Head: Normocephalic and atraumatic.  Right Ear: External ear normal.  Left Ear: External ear normal.  Nose: Nose normal.  Mouth/Throat: Oropharynx is clear and moist. No oropharyngeal exudate.  Eyes: Conjunctivae are normal. Pupils are equal, round, and reactive to light. No scleral icterus.  Neck: Normal range of motion. Neck supple. No tracheal deviation present. No thyromegaly present.  Cardiovascular: Normal rate, regular rhythm, normal heart sounds and intact distal pulses.   No murmur heard. Pulmonary/Chest: Effort normal and breath sounds normal. No respiratory distress. She has no wheezes. She has no rales.  Lymphadenopathy:    She has no cervical adenopathy.    Neurological: She is alert and oriented to person, place, and time.  Skin: Skin is warm and dry. No erythema. No pallor.  Psychiatric: Her behavior is normal. Judgment normal.  Breasts are examined. There is no axillary adenopathy on either side. There is no supraclavicular adenopathy or cervical adenopathy on either side. She has a 3 cm mass in the lower-outer quadrant of the right breast. There is moderate ecchymosis from her breast biopsy. The mass is deep but mobile.  Data Reviewed I have copies of her MRI and mammogram and ultrasound. There is a decrease in weight or mass in the right breast. Biopsy results showed invasive mammary carcinoma. She is strongly ER and PR positive.  Assessment    This is a patient with right breast cancer. We discussed her in the breast cancer clinic. I discussed a lumpectomy versus mastectomy with her. She reports that she will keep her breast and had just a lumpectomy performed. Other recommendations are for any hormonal therapy postoperatively only. There are no plans for radiation therapy.    Plan    After long discussion, she wishes to proceed with distal lumpectomy of the right breast. I discussed the risk of the procedure which includes but is not limited to bleeding, infection, need for further surgery if margins are positive, etc. She understands and wishes to proceed. Surgery will be scheduled. Likelihood of success is good.       Diron Haddon A 10/09/2011, 1:39 PM

## 2011-10-09 NOTE — Progress Notes (Addendum)
[name]    HPI: [name] is a 77 year old [place] woman presenting to the multidisciplinary breast cancer clinic today for evaluation and treatment in the setting of newly diagnosed breast cancer.   She had routine screening mammography 09/26/2011 at [facility] showing a possible abnormality in the left mid breast. She was recalled for additional views November 12, and underwent bilateral diagnostic mammography and right ultrasonography. This showed an irregular ill-defined low-density mass in the right breast without associated microcalcifications. On ultrasound this was heterogeneous, predominantly hypoechoic, with ill-defined borders, and measured 1.9 cm. Biopsy of the mass was performed the same day, and showed ([id number]) an invasive ductal breast cancer, e-cadherin positive, grade 2, estrogen receptor 83% and progesterone receptor 94% positive, with an MIB-1-1 of 90%, but no HER-2 amplification by [facility].  With this information the patient was set up for bilateral breast MRI, performed November 19. This showed a 3 cm area of irregular confluent non-masslike enhancement in the outer central right breast. There were no other suspicious areas in either breast and no evidence of adenopathy in the axillae or internal mammary areas. The patient's case was discussed this morning at the multidisciplinary breast cancer conference and a preliminary plan was suggested namely starting with surgery, considering anti-estrogens, and likely foregoing radiation.  Past Medical History  Diagnosis Date  . Diabetes mellitus, type 2   . Gout   . Hyperlipidemia   . Hypertension   . Hyperthyroidism   . Anxiety     Past Surgical History  Procedure Date  . Cholecystectomy   . Rotator cuff repair     right  . Abdominal hysterectomy     FAMILY HISTORY: The patient's father died from a stroke at the age of 46 the patient's mother died at age 71 possibly from complications of bladder cancer. The  patient had 2 brothers and 2 sisters. There is no evidence of breast or ovarian cancer in the family.  Gynecologic history: Menarche age 72, menopause age 75. She never took hormone replacement. She is GX P2, first live birth age 74  Social History: The patient is a widow and lives by herself. Her daughter [name] lives across the street with her own family. [name] is a homemaker as is the patient's (Ms. Creger only worked outside of the home when there was a [place] during [place] II). The patient has 3 grandchildren and 3 great-grandchildren she attends [employer].  Health maintenance: She  reports that she has never smoked. She does not have any smokeless tobacco history on file. She does not drink alcoholic beverages.   Cholesterol unknown  Bone density never  Colonoscopy never  (PAP) Not since hysterectomy  Allergies: No Known Allergies      ROS she had no symptoms in particular at the time of her mammogram and was not aware that there was a mass in her breast, which indeed is not palpable. She thinks she needs a hearing aid and she has some arthritis. She has had gout on 2 occasions but very remotely, about 25 years ago she had some glucose intolerance which has been treated with diet only. Otherwise a detailed review of systems was noncontributory. Note that this patient just passed herr driving test last October and still drives herself to church in the grocery store on a regular basis.  Physical Exam:  Blood pressure 152/77, pulse 69, temperature 97.9 F (36.6 C), temperature source Oral, height 5' 5.5" (1.664 m), weight  157 lb 1.6 oz (71.26 kg).  Sclerae unicteric Oropharynx clear No peripheral adenopathy Lungs no rales or rhonchi Heart regular rate and rhythm Abd benign MSK no focal spinal tenderness, no peripheral edema Neuro: nonfocal Breasts: Right breast status post recent biopsy with some ecchymosis, but no palpable mass,  no significant skin change, no nipple retraction. Left breast unremarkable.  LABS   Results for orders placed in visit on 10/09/11 (from the past 48 hour(s))  CBC WITH DIFFERENTIAL     Status: Normal   Collection Time   10/09/11 12:33 PM      Component Value Range Comment   WBC 6.8  3.9 - 10.3 (10e3/uL)    NEUT# 4.7  1.5 - 6.5 (10e3/uL)    HGB 13.3  11.6 - 15.9 (g/dL)    HCT 39.0  34.8 - 46.6 (%)    Platelets 207  145 - 400 (10e3/uL)    MCV 95.6  79.5 - 101.0 (fL)    MCH 32.5  25.1 - 34.0 (pg)    MCHC 34.0  31.5 - 36.0 (g/dL)    RBC 4.08  3.70 - 5.45 (10e6/uL)    RDW 12.9  11.2 - 14.5 (%)    lymph# 1.5  0.9 - 3.3 (10e3/uL)    MONO# 0.5  0.1 - 0.9 (10e3/uL)    Eosinophils Absolute 0.1  0.0 - 0.5 (10e3/uL)    Basophils Absolute 0.1  0.0 - 0.1 (10e3/uL)    NEUT% 68.9  38.4 - 76.8 (%)    LYMPH% 22.3  14.0 - 49.7 (%)    MONO% 6.8  0.0 - 14.0 (%)    EOS% 1.1  0.0 - 7.0 (%)    BASO% 0.9  0.0 - 2.0 (%)   COMPREHENSIVE METABOLIC PANEL     Status: Abnormal   Collection Time   10/09/11 12:33 PM      Component Value Range Comment   Sodium 140  135 - 145 (mEq/L)    Potassium 3.7  3.5 - 5.3 (mEq/L)    Chloride 101  96 - 112 (mEq/L)    CO2 29  19 - 32 (mEq/L)    Glucose, Bld 115 (*) 70 - 99 (mg/dL)    BUN 22  6 - 23 (mg/dL)    Creatinine, Ser 1.26 (*) 0.50 - 1.10 (mg/dL)    Total Bilirubin 0.5  0.3 - 1.2 (mg/dL)    Alkaline Phosphatase 77  39 - 117 (U/L)    AST 18  0 - 37 (U/L)    ALT 14  0 - 35 (U/L)    Total Protein 7.2  6.0 - 8.3 (g/dL)    Albumin 4.0  3.5 - 5.2 (g/dL)    Calcium 10.5  8.4 - 10.5 (mg/dL)        FILMS: as already discussed     Assessment: 77 year old [place] woman status post right breast biopsy November of 2012 for what clinically is a T2 N0 (steg II) invasive ductal carcinoma, strongly estrogen and progesterone receptor positive, with a very elevated MIB-1, but no HER-2 amplification.  Plan:  Her case was discussed this morning at the multidisciplinary  conference and again this afternoon at the multidisciplinary breast cancer clinic. It is our feeling that the patient would get very little benefit from radiation after lumpectomy, and therefore she will not meet with radiation after today. She may benefit from antiestrogen therapy postoperatively, but by the benefit it may be very limited, and we will make that decision once we know exactly  what the margin status was.  Accordingly the plan is for the patient to proceed to a partial mastectomy under Dr. Ninfa Linden, then return to see me in January, at which time we will make a definitive decision regarding systemic treatment. MAGRINAT,GUSTAV C 10/09/2011, 2:58 PM

## 2011-10-09 NOTE — Patient Instructions (Signed)
[facility] °Office Phone Number [phone] ° °BREAST BIOPSY/ PARTIAL MASTECTOMY: POST OP INSTRUCTIONS ° °Always review your discharge instruction sheet given to you by the facility where your surgery was performed. ° °IF YOU HAVE DISABILITY OR FAMILY LEAVE FORMS, YOU MUST BRING THEM TO THE OFFICE FOR PROCESSING.  DO NOT GIVE THEM TO YOUR DOCTOR. ° °1. A prescription for pain medication may be given to you upon discharge.  Take your pain medication as prescribed, if needed.  If narcotic pain medicine is not needed, then you may take acetaminophen (Tylenol) or ibuprofen (Advil) as needed. °2. Take your usually prescribed medications unless otherwise directed °3. If you need a refill on your pain medication, please contact your pharmacy.  They will contact our office to request authorization.  Prescriptions will not be filled after 5pm or on week-ends. °4. You should eat very light the first 24 hours after surgery, such as soup, crackers, pudding, etc.  Resume your normal diet the day after surgery. °5. Most patients will experience some swelling and bruising in the breast.  Ice packs and a good support bra will help.  Swelling and bruising can take several days to resolve.  °6. It is common to experience some constipation if taking pain medication after surgery.  Increasing fluid intake and taking a stool softener will usually help or prevent this problem from occurring.  A mild laxative (Milk of Magnesia or Miralax) should be taken according to package directions if there are no bowel movements after 48 hours. °7. Unless discharge instructions indicate otherwise, you may remove your bandages 24-48 hours after surgery, and you may shower at that time.  You may have steri-strips (small skin tapes) in place directly over the incision.  These strips should be left on the skin for 7-10 days.  If your surgeon used skin glue on the incision, you may shower in 24 hours.  The glue will flake off over the  next 2-3 weeks.  Any sutures or staples will be removed at the office during your follow-up visit. °8. ACTIVITIES:  You may resume regular daily activities (gradually increasing) beginning the next day.  Wearing a good support bra or sports bra minimizes pain and swelling.  You may have sexual intercourse when it is comfortable. °a. You may drive when you no longer are taking prescription pain medication, you can comfortably wear a seatbelt, and you can safely maneuver your car and apply brakes. °b. RETURN TO WORK:  ______________________________________________________________________________________ °9. You should see your doctor in the office for a follow-up appointment approximately two weeks after your surgery.  Your doctor’s nurse will typically make your follow-up appointment when she calls you with your pathology report.  Expect your pathology report 2-3 business days after your surgery.  You may call to check if you do not hear from us after three days. °10. OTHER INSTRUCTIONS: _______________________________________________________________________________________________ _____________________________________________________________________________________________________________________________________ °_____________________________________________________________________________________________________________________________________ °_____________________________________________________________________________________________________________________________________ ° °WHEN TO CALL YOUR DOCTOR: °1. Fever over 101.0 °2. Nausea and/or vomiting. °3. Extreme swelling or bruising. °4. Continued bleeding from incision. °5. Increased pain, redness, or drainage from the incision. ° °The clinic staff is available to answer your questions during regular business hours.  Please don’t hesitate to call and ask to speak to one of the nurses for clinical concerns.  If you have a medical emergency, go to the nearest  emergency room or call 911.  A surgeon from [facility] is always on call at the hospital. ° °For further questions, please visit [email or web address]  °

## 2011-10-14 ENCOUNTER — Encounter: Payer: Self-pay | Admitting: Family Medicine

## 2011-10-15 ENCOUNTER — Telehealth: Payer: Self-pay | Admitting: *Deleted

## 2011-10-15 NOTE — Progress Notes (Signed)
CC:   Chauncey Cruel, M.D. Coralie Keens, M.D.  DIAGNOSIS:  Breast cancer.  NARRATIVE:  Ms. Belmarez was seen briefly in multidisciplinary breast clinic today.  Her case was discussed at breast conference this morning.  The patient is a 77 year old female with a recent diagnosis of breast cancer.  It was discussed that we felt that surgical resection was recommended for her with the consideration of possible hormonal treatment.  In the event of obtaining negative margins, as we expect to be feasible for her case, I do not anticipate the need for radiation treatment.  I briefly discussed this with the patient and all of her questions were answered.  I would be happy to see her at any time in the future if I could be of any further assistance and if there are any unexpected difficulties with her treatment in terms of obtaining local control.    ______________________________ Jodelle Gross, M.D., Ph.D. JSM/MEDQ  D:  10/13/2011  T:  10/15/2011  Job:  [id number]

## 2011-10-15 NOTE — Telephone Encounter (Signed)
Spoke with pt concerning [facility] from 10/09/11.  Pt denies questions or needs regarding dx, prognostic panel, or treatment care plan.  Encourage pt to call with concerns.  Received verbal understanding.  Contact information given.

## 2011-10-23 ENCOUNTER — Encounter: Payer: Self-pay | Admitting: Family Medicine

## 2011-10-29 ENCOUNTER — Encounter: Payer: Self-pay | Admitting: Family Medicine

## 2011-10-30 ENCOUNTER — Ambulatory Visit (INDEPENDENT_AMBULATORY_CARE_PROVIDER_SITE_OTHER): Payer: Medicare Other | Admitting: Family Medicine

## 2011-10-30 ENCOUNTER — Encounter (HOSPITAL_COMMUNITY): Payer: Self-pay | Admitting: Pharmacy Technician

## 2011-10-30 ENCOUNTER — Encounter: Payer: Self-pay | Admitting: Family Medicine

## 2011-10-30 DIAGNOSIS — R6889 Other general symptoms and signs: Secondary | ICD-10-CM

## 2011-10-30 DIAGNOSIS — E119 Type 2 diabetes mellitus without complications: Secondary | ICD-10-CM

## 2011-10-30 DIAGNOSIS — E785 Hyperlipidemia, unspecified: Secondary | ICD-10-CM

## 2011-10-30 DIAGNOSIS — I10 Essential (primary) hypertension: Secondary | ICD-10-CM

## 2011-10-30 DIAGNOSIS — R7989 Other specified abnormal findings of blood chemistry: Secondary | ICD-10-CM

## 2011-10-30 DIAGNOSIS — Z0000 Encounter for general adult medical examination without abnormal findings: Secondary | ICD-10-CM

## 2011-10-30 LAB — HEPATIC FUNCTION PANEL
ALT: 18 U/L (ref 0–35)
AST: 19 U/L (ref 0–37)
Alkaline Phosphatase: 64 U/L (ref 39–117)
Bilirubin, Direct: 0.1 mg/dL (ref 0.0–0.3)
Total Bilirubin: 0.5 mg/dL (ref 0.3–1.2)
Total Protein: 7.1 g/dL (ref 6.0–8.3)

## 2011-10-30 LAB — CBC WITH DIFFERENTIAL/PLATELET
Basophils Absolute: 0 10*3/uL (ref 0.0–0.1)
Basophils Relative: 0.3 % (ref 0.0–3.0)
Eosinophils Absolute: 0.1 10*3/uL (ref 0.0–0.7)
Hemoglobin: 12.9 g/dL (ref 12.0–15.0)
Lymphocytes Relative: 18.4 % (ref 12.0–46.0)
MCHC: 34.2 g/dL (ref 30.0–36.0)
Monocytes Relative: 6.3 % (ref 3.0–12.0)
Neutrophils Relative %: 73.7 % (ref 43.0–77.0)
RBC: 3.88 Mil/uL (ref 3.87–5.11)
RDW: 13 % (ref 11.5–14.6)

## 2011-10-30 LAB — HEMOGLOBIN A1C: Hgb A1c MFr Bld: 5.8 % (ref 4.6–6.5)

## 2011-10-30 LAB — LIPID PANEL
LDL Cholesterol: 58 mg/dL (ref 0–99)
Total CHOL/HDL Ratio: 3
VLDL: 29.2 mg/dL (ref 0.0–40.0)

## 2011-10-30 LAB — BASIC METABOLIC PANEL
CO2: 29 mEq/L (ref 19–32)
Chloride: 103 mEq/L (ref 96–112)
Potassium: 3.9 mEq/L (ref 3.5–5.1)

## 2011-10-30 NOTE — Progress Notes (Signed)
  Subjective:    Patient ID: [name], female    DOB: 1919-10-06, 77 y.o.   MRN: [id number]  HPI Here today for CPE.  Risk Factors: DM- chronic problem, CBGs ranging 80-120.  On Amaryl, Januvia.  No symptomatic lows w/ exception of day she was raking leaves.  UTD on eye exam. Hyperlipidemia- chronic problem.  On Lipitor.  Denies abd pain, N/V, myalgias HTN- chronic problem, excellent control today.  On acebutolol, norvasc, Micardis HCT, Zaroxolyn.  Denies CP, SOB, HAs, visual changes, edema. Physical Activity: walking regularly Fall Risk: low risk Depression: denies current sxs Hearing: decreased to conversational tones ADL's: independent Cognitive: normal linear thought process, memory and attention intact Home Safety: safe at home, daughter provides considerable help Height, Weight, BMI, Visual Acuity: see vitals, vision corrected to 20/20 w/ glasses Counseling: UTD on mammo (has breast cancer), no need for pap or colonoscopy Labs Ordered: See A&P Care Plan: See A&P    Review of Systems Patient reports no adenopathy, fever, weight change, persistant/recurrent hoarseness , swallowing issues, chest pain, palpitations, edema, persistant/recurrent cough, hemoptysis, dyspnea (rest/exertional/paroxysmal nocturnal), gastrointestinal bleeding (melena, rectal bleeding), abdominal pain, significant heartburn, bowel changes, GU symptoms (dysuria, hematuria, incontinence), Gyn symptoms (abnormal  bleeding, pain),  syncope, focal weakness, memory loss, numbness & tingling, skin/hair/nail changes, abnormal bruising or bleeding, anxiety, or depression.     Objective:   Physical Exam General Appearance:    Alert, cooperative, no distress, appears stated age  Head:    Normocephalic, without obvious abnormality, atraumatic  Eyes:    PERRL, conjunctiva/corneas clear, EOM's intact, fundi    benign, both eyes  Ears:    Normal TM's and external ear canals, both ears  Nose:   Nares normal,  septum midline, mucosa normal, no drainage    or sinus tenderness  Throat:   Lips, mucosa, and tongue normal; teeth and gums normal  Neck:   Supple, symmetrical, trachea midline, no adenopathy;    Thyroid: no enlargement/tenderness/nodules  Back:     Symmetric, no curvature, ROM normal, no CVA tenderness  Lungs:     Clear to auscultation bilaterally, respirations unlabored  Chest Wall:    No tenderness or deformity   Heart:    Regular rate and rhythm, S1 and S2 normal, I/VI SEM  Breast Exam:    Deferred  Abdomen:     Soft, non-tender, bowel sounds active all four quadrants,    no masses, no organomegaly  Genitalia:    Deferred  Rectal:    Extremities:   Extremities normal, atraumatic, no cyanosis or edema  Pulses:   2+ and symmetric all extremities  Skin:   Skin color, texture, turgor normal, no rashes or lesions  Lymph nodes:   Cervical, supraclavicular, and axillary nodes normal  Neurologic:   CNII-XII intact, normal strength, sensation and reflexes    throughout          Assessment & Plan:

## 2011-10-30 NOTE — Patient Instructions (Signed)
Follow up in 6 months to recheck cholesterol, blood pressure, diabetes We'll notify you of your lab results Keep up the good work- you look great! Call with any questions or concerns Good luck with surgery! Happy Holidays!

## 2011-10-31 ENCOUNTER — Encounter (HOSPITAL_COMMUNITY): Payer: Self-pay

## 2011-10-31 ENCOUNTER — Encounter (HOSPITAL_COMMUNITY)
Admission: RE | Admit: 2011-10-31 | Discharge: 2011-10-31 | Disposition: A | Discharge status: Home / Self Care | Payer: Medicare Other | Source: Ambulatory Visit | Attending: Surgery | Admitting: Surgery

## 2011-10-31 ENCOUNTER — Ambulatory Visit (HOSPITAL_COMMUNITY)
Admission: RE | Admit: 2011-10-31 | Discharge: 2011-10-31 | Disposition: A | Discharge status: Home / Self Care | Payer: Medicare Other | Source: Ambulatory Visit | Attending: Surgery | Admitting: Surgery

## 2011-10-31 DIAGNOSIS — M5380 Other specified dorsopathies, site unspecified: Secondary | ICD-10-CM | POA: Insufficient documentation

## 2011-10-31 DIAGNOSIS — Z01812 Encounter for preprocedural laboratory examination: Secondary | ICD-10-CM | POA: Insufficient documentation

## 2011-10-31 DIAGNOSIS — Z01818 Encounter for other preprocedural examination: Secondary | ICD-10-CM | POA: Insufficient documentation

## 2011-10-31 HISTORY — DX: Malignant (primary) neoplasm, unspecified: C80.1

## 2011-10-31 LAB — T4, FREE: Free T4: 1.07 ng/dL (ref 0.60–1.60)

## 2011-10-31 LAB — T3, FREE: T3, Free: 2.3 pg/mL (ref 2.3–4.2)

## 2011-10-31 LAB — SURGICAL PCR SCREEN: Staphylococcus aureus: NEGATIVE

## 2011-10-31 NOTE — Pre-Procedure Instructions (Signed)
CBC WITH DIFF< BMET AND EKG FROM 10/30/11 IN EP[facility]

## 2011-10-31 NOTE — Patient Instructions (Addendum)
[place]  10/31/2011   Your procedure is scheduled on:  11/06/11  Surgery [id number]   [facility]  Report to [place] at 1210 PM.  Call this number if you have problems the morning of surgery: [phone]     PST [id number]              NO SUGAR MEDS AM OF SURGERY  Remember:   Do not eat food:After Midnight.  Night  Tuesday NIGHT  May have clear liquids:up to 6 Hours before arrival until 600 am, then none  Clear liquids include soda, tea, black coffee, apple or grape juice, broth.  Take these medicines the morning of surgery with A SIP OF WATER: Norvasc, Synthyroid with sip water              No aspirin or antiinflammatories 5 days pre op  Do not wear jewelry, make-up or nail polish.  Do not wear lotions, powders, or perfumes. You may wear deodorant.  Do not shave 48 hours prior to surgery.  Do not bring valuables to the hospital.  Contacts, dentures or bridgework may not be worn into surgery.  Leave suitcase in the car. After surgery it may be brought to your room.  For patients admitted to the hospital, checkout time is 11:00 AM the day of discharge.   Patients discharged the day of surgery will not be allowed to drive home.  Name and phone number of your driver: daughter  Special Instructions: CHG Shower Use Special Wash: 1/2 bottle night before surgery and 1/2 bottle morning of surgery.  Regular soap face and privates   Please read over the following fact sheets that you were given: MRSA Information

## 2011-11-04 ENCOUNTER — Encounter: Payer: Self-pay | Admitting: *Deleted

## 2011-11-04 NOTE — Progress Notes (Signed)
Mailed after appt letter to pt. 

## 2011-11-05 NOTE — Assessment & Plan Note (Signed)
Chronic problem.  Well controlled on current meds.  Asymptomatic.  No changes. 

## 2011-11-05 NOTE — Assessment & Plan Note (Signed)
PE WNL.  UTD on health maintenance.  Check labs.  Anticipatory guidance provided.

## 2011-11-05 NOTE — Assessment & Plan Note (Signed)
Chronic problem.  Tolerating statin w/out difficulty.  Check labs.  Adjust meds prn  

## 2011-11-05 NOTE — Assessment & Plan Note (Signed)
Chronic problem, typically well controlled.  Asymptomatic.  UTD on eye exam.  Check labs.  Adjust meds prn

## 2011-11-06 ENCOUNTER — Encounter (HOSPITAL_COMMUNITY)
Admission: RE | Disposition: A | Discharge status: Home / Self Care | Payer: Self-pay | Source: Ambulatory Visit | Attending: Surgery

## 2011-11-06 ENCOUNTER — Encounter (HOSPITAL_COMMUNITY): Payer: Self-pay | Admitting: Anesthesiology

## 2011-11-06 ENCOUNTER — Ambulatory Visit (HOSPITAL_COMMUNITY): Payer: Medicare Other | Admitting: Anesthesiology

## 2011-11-06 ENCOUNTER — Other Ambulatory Visit (INDEPENDENT_AMBULATORY_CARE_PROVIDER_SITE_OTHER): Payer: Self-pay | Admitting: Surgery

## 2011-11-06 ENCOUNTER — Ambulatory Visit (HOSPITAL_COMMUNITY)
Admission: RE | Admit: 2011-11-06 | Discharge: 2011-11-06 | Disposition: A | Discharge status: Home / Self Care | Payer: Medicare Other | Source: Ambulatory Visit | Attending: Surgery | Admitting: Surgery

## 2011-11-06 ENCOUNTER — Encounter (HOSPITAL_COMMUNITY): Payer: Self-pay | Admitting: *Deleted

## 2011-11-06 DIAGNOSIS — M109 Gout, unspecified: Secondary | ICD-10-CM | POA: Insufficient documentation

## 2011-11-06 DIAGNOSIS — C50919 Malignant neoplasm of unspecified site of unspecified female breast: Secondary | ICD-10-CM

## 2011-11-06 DIAGNOSIS — Z79899 Other long term (current) drug therapy: Secondary | ICD-10-CM | POA: Insufficient documentation

## 2011-11-06 DIAGNOSIS — E785 Hyperlipidemia, unspecified: Secondary | ICD-10-CM | POA: Insufficient documentation

## 2011-11-06 DIAGNOSIS — C50519 Malignant neoplasm of lower-outer quadrant of unspecified female breast: Secondary | ICD-10-CM | POA: Insufficient documentation

## 2011-11-06 DIAGNOSIS — I10 Essential (primary) hypertension: Secondary | ICD-10-CM | POA: Insufficient documentation

## 2011-11-06 DIAGNOSIS — E119 Type 2 diabetes mellitus without complications: Secondary | ICD-10-CM | POA: Insufficient documentation

## 2011-11-06 HISTORY — PX: BREAST LUMPECTOMY: SHX2

## 2011-11-06 LAB — GLUCOSE, CAPILLARY: Glucose-Capillary: 108 mg/dL — ABNORMAL HIGH (ref 70–99)

## 2011-11-06 SURGERY — BREAST LUMPECTOMY
Anesthesia: General | Site: Breast | Laterality: Right | Wound class: Clean

## 2011-11-06 MED ORDER — HYDROCODONE-ACETAMINOPHEN 5-325 MG PO TABS: 1.0000 | ORAL_TABLET | Freq: Four times a day (QID) | ORAL | Status: AC | PRN

## 2011-11-06 MED ORDER — ACETAMINOPHEN 650 MG RE SUPP
650.0000 mg | RECTAL | Status: DC | PRN
  Filled 2011-11-06: qty 1

## 2011-11-06 MED ORDER — BUPIVACAINE HCL (PF) 0.5 % IJ SOLN
INTRAMUSCULAR | Status: DC | PRN
  Administered 2011-11-06: 20 mL

## 2011-11-06 MED ORDER — PROMETHAZINE HCL 25 MG/ML IJ SOLN: 12.5000 mg | Freq: Four times a day (QID) | INTRAMUSCULAR | Status: DC | PRN

## 2011-11-06 MED ORDER — MIDAZOLAM HCL 5 MG/5ML IJ SOLN
INTRAMUSCULAR | Status: DC | PRN
  Administered 2011-11-06 (×2): 1 mg via INTRAVENOUS

## 2011-11-06 MED ORDER — LIDOCAINE HCL (CARDIAC) 20 MG/ML IV SOLN
INTRAVENOUS | Status: DC | PRN
  Administered 2011-11-06: 80 mg via INTRAVENOUS

## 2011-11-06 MED ORDER — ACETAMINOPHEN 325 MG PO TABS: 650.0000 mg | ORAL_TABLET | ORAL | Status: DC | PRN

## 2011-11-06 MED ORDER — PROPOFOL 10 MG/ML IV BOLUS
INTRAVENOUS | Status: DC | PRN
  Administered 2011-11-06: 150 mg via INTRAVENOUS

## 2011-11-06 MED ORDER — FENTANYL CITRATE 0.05 MG/ML IJ SOLN
INTRAMUSCULAR | Status: DC | PRN
  Administered 2011-11-06 (×2): 50 ug via INTRAVENOUS

## 2011-11-06 MED ORDER — SODIUM CHLORIDE 0.9 % IJ SOLN: 3.0000 mL | Freq: Two times a day (BID) | INTRAMUSCULAR | Status: DC

## 2011-11-06 MED ORDER — SODIUM CHLORIDE 0.9 % IJ SOLN: 3.0000 mL | INTRAMUSCULAR | Status: DC | PRN

## 2011-11-06 MED ORDER — EPHEDRINE SULFATE 50 MG/ML IJ SOLN
INTRAMUSCULAR | Status: DC | PRN
  Administered 2011-11-06: 5 mg via INTRAVENOUS

## 2011-11-06 MED ORDER — CEFAZOLIN SODIUM 1-5 GM-% IV SOLN
1.0000 g | INTRAVENOUS | Status: AC
  Administered 2011-11-06: 1 g via INTRAVENOUS
  Filled 2011-11-06: qty 50

## 2011-11-06 MED ORDER — SODIUM CHLORIDE 0.9 % IV SOLN: 250.0000 mL | INTRAVENOUS | Status: DC | PRN

## 2011-11-06 MED ORDER — BUPIVACAINE HCL (PF) 0.5 % IJ SOLN
INTRAMUSCULAR | Status: AC
  Filled 2011-11-06: qty 30

## 2011-11-06 MED ORDER — ONDANSETRON HCL 4 MG/2ML IJ SOLN: 4.0000 mg | Freq: Four times a day (QID) | INTRAMUSCULAR | Status: DC | PRN

## 2011-11-06 MED ORDER — LACTATED RINGERS IV SOLN
INTRAVENOUS | Status: DC
  Administered 2011-11-06: 1000 mL via INTRAVENOUS

## 2011-11-06 MED ORDER — FENTANYL CITRATE 0.05 MG/ML IJ SOLN: 25.0000 ug | INTRAMUSCULAR | Status: DC | PRN

## 2011-11-06 MED ORDER — OXYCODONE HCL 5 MG PO TABS: 5.0000 mg | ORAL_TABLET | ORAL | Status: DC | PRN

## 2011-11-06 SURGICAL SUPPLY — 35 items
ADH SKN CLS APL DERMABOND .7 (GAUZE/BANDAGES/DRESSINGS) ×1
APL SKNCLS STERI-STRIP NONHPOA (GAUZE/BANDAGES/DRESSINGS) ×1
APPLIER CLIP 9.375 MED OPEN (MISCELLANEOUS)
APR CLP MED 9.3 20 MLT OPN (MISCELLANEOUS)
BENZOIN TINCTURE PRP APPL 2/3 (GAUZE/BANDAGES/DRESSINGS) ×1 IMPLANT
CANISTER SUCTION 2500CC (MISCELLANEOUS) ×2 IMPLANT
CHLORAPREP W/TINT 26ML (MISCELLANEOUS) ×2 IMPLANT
CLIP APPLIE 9.375 MED OPEN (MISCELLANEOUS) IMPLANT
CLOSURE STERI STRIP 1/2 X4 (GAUZE/BANDAGES/DRESSINGS) ×1 IMPLANT
CLOTH BEACON ORANGE TIMEOUT ST (SAFETY) ×2 IMPLANT
COVER PROBE W GEL 5X96 (DRAPES) IMPLANT
COVER SURGICAL LIGHT HANDLE (MISCELLANEOUS) ×2 IMPLANT
DECANTER SPIKE VIAL GLASS SM (MISCELLANEOUS) ×2 IMPLANT
DERMABOND ADVANCED (GAUZE/BANDAGES/DRESSINGS) ×1
DERMABOND ADVANCED .7 DNX12 (GAUZE/BANDAGES/DRESSINGS) ×1 IMPLANT
ELECT CAUTERY BLADE 6.4 (BLADE) ×2 IMPLANT
ELECT REM PT RETURN 9FT ADLT (ELECTROSURGICAL) ×2
ELECTRODE REM PT RTRN 9FT ADLT (ELECTROSURGICAL) ×1 IMPLANT
GLOVE SURG SIGNA 7.5 PF LTX (GLOVE) ×2 IMPLANT
GOWN PREVENTION PLUS XLARGE (GOWN DISPOSABLE) ×2 IMPLANT
GOWN STRL NON-REIN LRG LVL3 (GOWN DISPOSABLE) ×2 IMPLANT
KIT BASIN OR (CUSTOM PROCEDURE TRAY) ×2 IMPLANT
KIT MARKER MARGIN INK (KITS) ×1 IMPLANT
NS IRRIG 1000ML POUR BTL (IV SOLUTION) ×2 IMPLANT
PACK GENERAL/GYN (CUSTOM PROCEDURE TRAY) ×2 IMPLANT
SPONGE GAUZE 4X4 12PLY (GAUZE/BANDAGES/DRESSINGS) ×1 IMPLANT
SPONGE LAP 4X18 X RAY DECT (DISPOSABLE) ×2 IMPLANT
STAPLER VISISTAT 35W (STAPLE) ×2 IMPLANT
SUT MNCRL AB 4-0 PS2 18 (SUTURE) ×2 IMPLANT
SUT SILK 2 0 SH (SUTURE) IMPLANT
SUT VIC AB 3-0 SH 18 (SUTURE) ×2 IMPLANT
SYR CONTROL 10ML LL (SYRINGE) ×2 IMPLANT
TAPE HYPAFIX 4 X10 (GAUZE/BANDAGES/DRESSINGS) ×1 IMPLANT
TOWEL OR 17X26 10 PK STRL BLUE (TOWEL DISPOSABLE) ×2 IMPLANT
WATER STERILE IRR 1000ML POUR (IV SOLUTION) IMPLANT

## 2011-11-06 NOTE — Anesthesia Preprocedure Evaluation (Addendum)
Anesthesia Evaluation    Airway Mallampati: II TM Distance: >3 FB Neck ROM: Full    Dental No notable dental hx.    Pulmonary  clear to auscultation  Pulmonary exam normal       Cardiovascular hypertension, Pt. on medications Regular Normal    Neuro/Psych Anxiety    GI/Hepatic   Endo/Other  Diabetes mellitus-, Type 2, Oral Hypoglycemic AgentsHypothyroidism Hyperthyroidism   Renal/GU      Musculoskeletal   Abdominal   Peds  Hematology   Anesthesia Other Findings   Reproductive/Obstetrics                           Anesthesia Physical Anesthesia Plan  ASA: III  Anesthesia Plan: General   Post-op Pain Management:    Induction: Intravenous  Airway Management Planned: LMA  Additional Equipment:   Intra-op Plan:   Post-operative Plan: Extubation in OR  Informed Consent: I have reviewed the patients History and Physical, chart, labs and discussed the procedure including the risks, benefits and alternatives for the proposed anesthesia with the patient or authorized representative who has indicated his/her understanding and acceptance.   Dental advisory given  Plan Discussed with: CRNA  Anesthesia Plan Comments:         Anesthesia Quick Evaluation

## 2011-11-06 NOTE — H&P (View-Only) (Signed)
Patient ID: [name], female   DOB: [birth year], 77 y.o.   MRN: [id number]  Chief Complaint  Patient presents with  . Other    right breast cancer    HPI [name] is a 77 y.o. female.   HPI She is referred here by Dr. Elige Radon for evaluation of a recent right breast mass which was biopsied and proven to be a malignancy. She has no complaints regarding her breast. She denies nipple discharge. She reports having had a biopsy of the right breast and maybe more than 30 years ago for benign disease. She is otherwise without complaints. Past Medical History  Diagnosis Date  . Diabetes mellitus, type 2   . Gout   . Hyperlipidemia   . Hypertension   . Hyperthyroidism   . Anxiety     Past Surgical History  Procedure Date  . Cholecystectomy   . Rotator cuff repair     right  . Abdominal hysterectomy     History reviewed. No pertinent family history.  Social History History  Substance Use Topics  . Smoking status: Never Smoker   . Smokeless tobacco: Not on file  . Alcohol Use: Not on file    No Known Allergies  Current Outpatient Prescriptions  Medication Sig Dispense Refill  . acebutolol (SECTRAL) 400 MG capsule Take 400 mg by mouth daily.        [name] allopurinol (ZYLOPRIM) 100 MG tablet Take 100 mg by mouth daily.        [name] ALPRAZolam (XANAX) 0.5 MG tablet Take 0.5 mg by mouth at bedtime.       [name] amLODipine (NORVASC) 5 MG tablet Take 5 mg by mouth daily.        [name] atorvastatin (LIPITOR) 10 MG tablet Take 10 mg by mouth daily.        . clonazePAM (KLONOPIN) 0.5 MG tablet TAKE ONE TABLET BY MOUTH THREE TIMES DAILY FOR ANXIETY  90 tablet  3  . glimepiride (AMARYL) 4 MG tablet Take 4 mg by mouth daily.        [name] levothyroxine (SYNTHROID, LEVOTHROID) 100 MCG tablet Take 100 mcg by mouth daily.        . metolazone (ZAROXOLYN) 2.5 MG tablet Take 2.5 mg by mouth as directed. 0.5 tabs po every 3 days       . sitaGLIPtan (JANUVIA) 50 MG tablet Take 1 tablet (50 mg  total) by mouth daily.  90 tablet  3  . telmisartan-hydrochlorothiazide (MICARDIS HCT) 80-12.5 MG per tablet Take 1 tablet by mouth daily.  90 tablet  1    Review of Systems Review of Systems  Constitutional: Negative for fever, chills and unexpected weight change.  HENT: Negative for hearing loss, congestion, sore throat, trouble swallowing and voice change.   Eyes: Negative for visual disturbance.  Respiratory: Negative for cough and wheezing.   Cardiovascular: Negative for chest pain, palpitations and leg swelling.  Gastrointestinal: Negative for nausea, vomiting, abdominal pain, diarrhea, constipation, blood in stool, abdominal distention and anal bleeding.  Genitourinary: Negative for hematuria, vaginal bleeding and difficulty urinating.  Musculoskeletal: Positive for back pain. Negative for arthralgias.  Skin: Negative for rash and wound.  Neurological: Negative for seizures, syncope and headaches.  Hematological: Negative for adenopathy. Does not bruise/bleed easily.  Psychiatric/Behavioral: Negative for confusion.    Blood pressure 152/77, pulse 69.  Physical Exam Physical Exam  Constitutional: She is oriented to person, place, and time. She appears well-developed and well-nourished. No distress.  HENT:  Head: Normocephalic and atraumatic.  Right Ear: External ear normal.  Left Ear: External ear normal.  Nose: Nose normal.  Mouth/Throat: Oropharynx is clear and moist. No oropharyngeal exudate.  Eyes: Conjunctivae are normal. Pupils are equal, round, and reactive to light. No scleral icterus.  Neck: Normal range of motion. Neck supple. No tracheal deviation present. No thyromegaly present.  Cardiovascular: Normal rate, regular rhythm, normal heart sounds and intact distal pulses.   No murmur heard. Pulmonary/Chest: Effort normal and breath sounds normal. No respiratory distress. She has no wheezes. She has no rales.  Lymphadenopathy:    She has no cervical adenopathy.    Neurological: She is alert and oriented to person, place, and time.  Skin: Skin is warm and dry. No erythema. No pallor.  Psychiatric: Her behavior is normal. Judgment normal.  Breasts are examined. There is no axillary adenopathy on either side. There is no supraclavicular adenopathy or cervical adenopathy on either side. She has a 3 cm mass in the lower-outer quadrant of the right breast. There is moderate ecchymosis from her breast biopsy. The mass is deep but mobile.  Data Reviewed I have copies of her MRI and mammogram and ultrasound. There is a decrease in weight or mass in the right breast. Biopsy results showed invasive mammary carcinoma. She is strongly ER and PR positive.  Assessment    This is a patient with right breast cancer. We discussed her in the breast cancer clinic. I discussed a lumpectomy versus mastectomy with her. She reports that she will keep her breast and had just a lumpectomy performed. Other recommendations are for any hormonal therapy postoperatively only. There are no plans for radiation therapy.    Plan    After long discussion, she wishes to proceed with distal lumpectomy of the right breast. I discussed the risk of the procedure which includes but is not limited to bleeding, infection, need for further surgery if margins are positive, etc. She understands and wishes to proceed. Surgery will be scheduled. Likelihood of success is good.       Jordie Schreur A 10/09/2011, 1:39 PM

## 2011-11-06 NOTE — Transfer of Care (Signed)
Immediate Anesthesia Transfer of Care Note  Patient: [name]  Procedure(s) Performed:  LUMPECTOMY  Patient Location: PACU  Anesthesia Type: General  Level of Consciousness: awake and alert   Airway & Oxygen Therapy: Patient Spontanous Breathing and Patient connected to face mask oxygen  Post-op Assessment: Report given to PACU RN and Post -op Vital signs reviewed and stable  Post vital signs: Reviewed and stable  Complications: No apparent anesthesia complications

## 2011-11-06 NOTE — Op Note (Signed)
11/06/2011  1:57 PM  PATIENT:  [name]  77 y.o. female  PRE-OPERATIVE DIAGNOSIS:  Right breast cancer  POST-OPERATIVE DIAGNOSIS:  right breast cancer  PROCEDURE:  Procedure(s): RIGHT BREAST LUMPECTOMY  SURGEON:  Surgeon(s): Harl Bowie, MD  PHYSICIAN ASSISTANT:   ASSISTANTS: none   ANESTHESIA:   local and general  EBL:     BLOOD ADMINISTERED:none  DRAINS: none   LOCAL MEDICATIONS USED:  MARCAINE 20CC  SPECIMEN:  Excision  DISPOSITION OF SPECIMEN:  PATHOLOGY  COUNTS:  YES  TOURNIQUET:  * No tourniquets in log *  DICTATION: .Dragon Dictation Indications: This is a 77 year old female with breast cancer. There is a palpable mass in the lower outer quadrant. After being met at the multi-disciplinary breast clinic, the decision was made to proceed with right breast lumpectomy  Procedure: The patient was brought to the operating room and identified as the correct patient. She was placed supine on the operating room table and general anesthesia was induced. Her right breast was then prepped and draped in the usual sterile fashion. The patient had a large, palpable mass in the lower outer quadrant of the breast. I anesthetized the skin with Marcaine in them a longitudinal incision over the top of the mass. I took this down to the breast tissue with the electrocautery. I then performed a wide lumpectomy going all the way down to the chest wall around the palpable breast cancer. The specimen was then removed in its entirety. I then marked all quadrants of the mass with paint. The specimen was as a pathologic for evaluation. I irrigated the wound with saline. Hemostasis was achieved with the cautery. I then closed the subcutaneous tissue with interrupted 3-0 Vicryl sutures and closed the skin with a running 4-0 Monocryl. Steri-Strips, gauze, and tape were then applied. The patient tolerated the procedure well. All the counts were correct at the end of the procedure. The  patient was then extubated in the operating room and taken in stable condition to the recovery room. PLAN OF CARE: Discharge to home after PACU  PATIENT DISPOSITION:  PACU - hemodynamically stable.   Delay start of Pharmacological VTE agent (>24hrs) due to surgical blood loss or risk of bleeding:  {YES/NO/NOT APPLICABLE:20182

## 2011-11-06 NOTE — Interval H&P Note (Signed)
History and Physical Interval Note: no change in history or physical exam  11/06/2011 6:32 AM  [name]  has presented today for surgery, with the diagnosis of Right breast cancer  The various methods of treatment have been discussed with the patient and family. After consideration of risks, benefits and other options for treatment, the patient has consented to  Procedure(s): LUMPECTOMY as Henson surgical intervention .  The patients' history has been reviewed, patient examined, no change in status, stable for surgery.  I have reviewed the patients' chart and labs.  Questions were answered to the patient's satisfaction.     [name]

## 2011-11-06 NOTE — Anesthesia Postprocedure Evaluation (Signed)
  Anesthesia Post-op Note  Patient: [name]  Procedure(s) Performed:  LUMPECTOMY  Patient Location: PACU  Anesthesia Type: General  Level of Consciousness: awake and alert   Airway and Oxygen Therapy: Patient Spontanous Breathing  Post-op Pain: mild  Post-op Assessment: Post-op Vital signs reviewed, Patient's Cardiovascular Status Stable, Respiratory Function Stable, Patent Airway and No signs of Nausea or vomiting  Post-op Vital Signs: stable  Complications: No apparent anesthesia complications

## 2011-11-07 ENCOUNTER — Other Ambulatory Visit: Payer: Self-pay | Admitting: Family Medicine

## 2011-11-07 NOTE — Telephone Encounter (Signed)
[place] to ask for address to send efile medications, was advised by rep that the medications are not allowed to be called in or efile. Pt aware. Will fax tomorrow

## 2011-11-08 ENCOUNTER — Encounter (HOSPITAL_COMMUNITY): Payer: Self-pay | Admitting: Surgery

## 2011-11-08 MED ORDER — ALLOPURINOL 100 MG PO TABS: 100.0000 mg | ORAL_TABLET | Freq: Every day | ORAL | Status: DC

## 2011-11-08 MED ORDER — ACEBUTOLOL HCL 400 MG PO CAPS: 400.0000 mg | ORAL_CAPSULE | Freq: Every day | ORAL | Status: DC

## 2011-11-08 MED ORDER — SITAGLIPTIN PHOSPHATE 50 MG PO TABS: 50.0000 mg | ORAL_TABLET | ORAL | Status: DC

## 2011-11-08 MED ORDER — LEVOTHYROXINE SODIUM 100 MCG PO TABS: 100.0000 ug | ORAL_TABLET | ORAL | Status: DC

## 2011-11-08 MED ORDER — AMLODIPINE BESYLATE 5 MG PO TABS: 5.0000 mg | ORAL_TABLET | ORAL | Status: DC

## 2011-11-08 MED ORDER — GLIMEPIRIDE 4 MG PO TABS: 4.0000 mg | ORAL_TABLET | ORAL | Status: DC

## 2011-11-08 MED ORDER — METOLAZONE 2.5 MG PO TABS: 2.5000 mg | ORAL_TABLET | ORAL | Status: DC

## 2011-11-08 MED ORDER — ATORVASTATIN CALCIUM 10 MG PO TABS: 10.0000 mg | ORAL_TABLET | Freq: Every day | ORAL | Status: DC

## 2011-11-08 MED ORDER — TELMISARTAN-HCTZ 80-12.5 MG PO TABS: 1.0000 | ORAL_TABLET | ORAL | Status: DC

## 2011-11-08 NOTE — Telephone Encounter (Signed)
Was advised by [facility] that all medications have to be faxed and unable to call in/escribe Printed/signed and manually faxed #90 with 3 refills for following medications: Sectral,zyloprim,norvasc,lipitor,amaryl,synthroid,januiva,zaroxolyn,micardis

## 2011-11-08 NOTE — Telephone Encounter (Signed)
Addended by: Shara Blazing A on: 11/08/2011 10:59 AM   Modules accepted: Orders

## 2011-11-26 ENCOUNTER — Encounter (INDEPENDENT_AMBULATORY_CARE_PROVIDER_SITE_OTHER): Payer: Self-pay | Admitting: Surgery

## 2011-11-26 ENCOUNTER — Ambulatory Visit (INDEPENDENT_AMBULATORY_CARE_PROVIDER_SITE_OTHER): Payer: Medicare Other | Admitting: Surgery

## 2011-11-26 VITALS — BP 144/90 | HR 72 | Temp 97.8°F | Resp 18 | Ht 67.0 in | Wt 159.6 lb

## 2011-11-26 DIAGNOSIS — Z09 Encounter for follow-up examination after completed treatment for conditions other than malignant neoplasm: Secondary | ICD-10-CM

## 2011-11-26 NOTE — Progress Notes (Signed)
Subjective:     Patient ID: [name], female   DOB: 1919-06-13, 78 y.o.   MRN: [id number]  HPI She is here for her first postoperative visit status post right breast lumpectomy for invasive cancer. She is doing well and has no complaints. She is accompanied by her daughter her report she is doing very well  Review of Systems     Objective:   Physical Exam On exam, her incision is well healed. There is no evidence of infection.  The final pathology demonstrated invasive cancer as well as in situ disease. Margins were negative with the closest being anterior at 1 mm. Again, she is strongly ER and PR positive and    Assessment:     Patient status post right breast lumpectomy    Plan:     She will be seeing her medical and radiation oncologists shortly. I discussed this with Dr. Margot Chimes and we do not believe she needs further resection. I will see her back in 3 months

## 2011-12-09 ENCOUNTER — Ambulatory Visit (HOSPITAL_BASED_OUTPATIENT_CLINIC_OR_DEPARTMENT_OTHER): Payer: Medicare Other | Admitting: Oncology

## 2011-12-09 VITALS — BP 147/79 | HR 67 | Temp 97.8°F | Ht 67.0 in | Wt 160.2 lb

## 2011-12-09 DIAGNOSIS — C50119 Malignant neoplasm of central portion of unspecified female breast: Secondary | ICD-10-CM | POA: Diagnosis not present

## 2011-12-09 DIAGNOSIS — C50911 Malignant neoplasm of unspecified site of right female breast: Secondary | ICD-10-CM

## 2011-12-09 NOTE — Progress Notes (Signed)
[name]    HPI: [name] is a 78 year old [place] woman we follow for a history of early breast cancer.   She had routine screening mammography 09/26/2011 at [place] [facility] showing a possible abnormality in the right mid breast. She was recalled for additional views November 12, and underwent bilateral diagnostic mammography and right ultrasonography. This showed an irregular ill-defined low-density mass in the right breast without associated microcalcifications. On ultrasound this was heterogeneous, predominantly hypoechoic, with ill-defined borders, and measured 1.9 cm. Biopsy of the mass was performed the same day, and showed ([id number]) an invasive ductal breast cancer, e-cadherin positive, grade 2, estrogen receptor 83% and progesterone receptor 94% positive, with an MIB-1-1 of 90%, but no HER-2 amplification by [facility].  With this information the patient was set up for bilateral breast MRI, performed November 19. This showed a 3 cm area of irregular confluent non-masslike enhancement in the outer central right breast. There were no other suspicious areas in either breast and no evidence of adenopathy in the axillae or internal mammary areas. The patient's case was discussed at the multidisciplinary breast cancer conference and a preliminary plan was suggested, namely starting with surgery, considering anti-estrogens, and likely foregoing radiation. The patient then proceeded to definitive Left lumpectomy 11/06/2011 with results as discussed below  Past Medical History  Diagnosis Date  . Diabetes mellitus, type 2   . Gout   . Hyperlipidemia   . Hyperthyroidism   . Cancer     right breast  . Anxiety     loss of child 1 year ago  . Hypertension     states eccho Dr Mare Ferrari 1 year ago- no record found in [facility] or office records    Past Surgical History  Procedure Date  . Rotator cuff repair 1999    right  . Abdominal hysterectomy 1995  . Cholecystectomy 1976  . Breast  lumpectomy 11/06/2011    Procedure: LUMPECTOMY;  Surgeon: Harl Bowie, MD;  Location: [facility];  Service: General;  Laterality: Right;    FAMILY HISTORY: The patient's father died from a stroke at the age of 6 the patient's mother died at age 18 possibly from complications of bladder cancer. The patient had 2 brothers and 2 sisters. There is no evidence of breast or ovarian cancer in the family.  Gynecologic history: Menarche age 35, menopause age 43. She never took hormone replacement. She is GX P2, first live birth age 75  Social History: The patient is a widow and lives by herself. Her daughter [name] lives across the street with her own family. 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 Sopko only worked outside of the home when there was a Roy during [place]). The patient has 3 grandchildren and 3 great-grandchildren; she attends [employer].  Health maintenance: She  reports that she has never smoked. She does not have any smokeless tobacco history on file. She does not drink alcoholic beverages.   Cholesterol unknown  Bone density never  Colonoscopy never  (PAP) Not since hysterectomy  Allergies: No Known Allergies    Current outpatient prescriptions:acebutolol (SECTRAL) 400 MG capsule, Take 1 capsule (400 mg total) by mouth at bedtime., Disp: 90 capsule, Rfl: 3;  allopurinol (ZYLOPRIM) 100 MG tablet, Take 1 tablet (100 mg total) by mouth daily., Disp: 90 tablet, Rfl: 3;  amLODipine (NORVASC) 5 MG tablet, Take 1 tablet (5 mg total) by mouth every morning., Disp: 90 tablet, Rfl: 3  atorvastatin (LIPITOR) 10 MG tablet, Take 1 tablet (10 mg total) by mouth at bedtime., Disp: 90 tablet, Rfl: 3;  clonazePAM (KLONOPIN) 0.5 MG tablet, Take 0.5 mg by mouth at bedtime as needed. Anxiety , Disp: , Rfl: ;  glimepiride (AMARYL) 4 MG tablet, Take 1 tablet (4 mg total) by mouth every morning., Disp: 90 tablet, Rfl: 3 levothyroxine  (SYNTHROID, LEVOTHROID) 100 MCG tablet, Take 1 tablet (100 mcg total) by mouth every morning., Disp: 90 tablet, Rfl: 3;  metolazone (ZAROXOLYN) 2.5 MG tablet, Take 1 tablet (2.5 mg total) by mouth every morning. 0.5 TABLET EVERY 3 DAYS , Disp: 90 tablet, Rfl: 3;  sitaGLIPtin (JANUVIA) 50 MG tablet, Take 1 tablet (50 mg total) by mouth every morning., Disp: 90 tablet, Rfl: 3 telmisartan-hydrochlorothiazide (MICARDIS HCT) 80-12.5 MG per tablet, Take 1 tablet by mouth every morning., Disp: 90 tablet, Rfl: 3;  ALPRAZolam (XANAX) 0.5 MG tablet, Take 0.5 mg by mouth at bedtime as needed. Sleep , Disp: , Rfl:   Interval history: Since her last visit here she had her definitive right lumpectomy, with results as noted below. The anterior margin was 1 mm, but again that is "air". Other margins were clearly negative. HER-2 was repeated and was again not amplified. The patient's daughter is with her today.   ROS she tolerated the surgery well, with no unusual side effects and particularly no fever, swelling, dehiscence, unusual pain, or bleeding. There was no erythema. She is doing all her normal activities of daily living, and greatly enjoyed the holidays. A detailed review of systems is otherwise entirely negative.  Physical Exam:  Blood pressure 147/79, pulse 67, temperature 97.8 F (36.6 C), height 5\' 7"  (1.702 m), weight 160 lb 3.2 oz (72.666 kg).  Sclerae unicteric Oropharynx clear No peripheral adenopathy, particularly negative right axilla. Lungs no rales or rhonchi Heart regular rate and rhythm Abd benign MSK no focal spinal tenderness, no peripheral edema Neuro: nonfocal Breasts: Right breast status post lumpectomy. The incision has healed very nicely. There are no suspicious masses or skin changes. Left breast unremarkable.  LABS   No results found for this or any previous visit (from the past 48 hour(s)).  FILMS: No new result     Assessment: 78 year old [place] woman status post  right lumpectomy 11/06/2011 for a T2 NX, Stage II invasive ductal carcinoma, grade 2, strongly estrogen and progesterone receptor positive, with a very elevated MIB-1, but no HER-2 amplification.  Plan:  We discussed anti-estrogens in detail. She understands the possible toxicities side effects and complications of tamoxifen and letrozole. We are concerned about the risk of clots and particularly strokes in a very elderly patients with tamoxifen so we are staying away from that medication. We discussed letrozole in detail as well. We have never had a bone density on her, but she is not overweight and she is likely already to have at least osteopenia if not osteoporosis. At any rate I don't think there would be any significant survival advantage from her going on aromatase inhibitors, given her age. If there were a recurrence presumably we should be able to control it with anti-estrogens at that time  After this long discussion we decided we would do observation only. She is very comfortable with this decision. She will see Dr. Rush Farmer in April and see [place] again in July, and we will continue to "tacking" her in that fashion for the first 2 years, after which we will see her on an every 6 month basis. She  knows to call for any problems that may develop before the next visit.   MAGRINAT,GUSTAV C   12/09/2011, 10:12 AM

## 2012-01-06 ENCOUNTER — Other Ambulatory Visit: Payer: Self-pay | Admitting: *Deleted

## 2012-01-06 MED ORDER — GLUCOSE BLOOD VI STRP: ORAL_STRIP | Status: DC

## 2012-01-06 NOTE — Telephone Encounter (Signed)
Refill request for glucose test strips to be sent to [facility], noted company request the rx has to be printed and faxed manually no matter the rx .rx faxed to pharmacy, manually.

## 2012-01-07 ENCOUNTER — Other Ambulatory Visit: Payer: Self-pay | Admitting: *Deleted

## 2012-01-07 NOTE — Telephone Encounter (Signed)
Called [facility] to clarify the fax number as [phone], manually faxed rx for test strips

## 2012-01-14 ENCOUNTER — Telehealth: Payer: Self-pay | Admitting: Family Medicine

## 2012-01-14 MED ORDER — CLONAZEPAM 0.5 MG PO TABS: 0.5000 mg | ORAL_TABLET | Freq: Every evening | ORAL | Status: DC | PRN

## 2012-01-14 NOTE — Telephone Encounter (Signed)
Refill: Klonopin .5mg  tab. Take 1 tablet by mouth three times daily for anxiety. Qty 90. Last fill 04-08-11

## 2012-01-14 NOTE — Telephone Encounter (Signed)
.  rx faxed to pharmacy, manually.  

## 2012-01-14 NOTE — Telephone Encounter (Signed)
Last OV 10-30-11 however noted discontinued on 10-30-11 per [place]

## 2012-01-14 NOTE — Telephone Encounter (Signed)
Ok for #90, no refills 

## 2012-01-14 NOTE — Telephone Encounter (Signed)
Refill

## 2012-02-17 ENCOUNTER — Ambulatory Visit: Payer: Medicare Other | Admitting: Family Medicine

## 2012-02-25 ENCOUNTER — Encounter (INDEPENDENT_AMBULATORY_CARE_PROVIDER_SITE_OTHER): Payer: Medicare Other | Admitting: Surgery

## 2012-02-26 ENCOUNTER — Ambulatory Visit (INDEPENDENT_AMBULATORY_CARE_PROVIDER_SITE_OTHER): Payer: Medicare Other | Admitting: Surgery

## 2012-02-26 ENCOUNTER — Encounter (INDEPENDENT_AMBULATORY_CARE_PROVIDER_SITE_OTHER): Payer: Self-pay | Admitting: Surgery

## 2012-02-26 VITALS — BP 147/84 | HR 80 | Temp 98.2°F | Resp 18 | Ht 67.0 in | Wt 165.2 lb

## 2012-02-26 DIAGNOSIS — C50919 Malignant neoplasm of unspecified site of unspecified female breast: Secondary | ICD-10-CM | POA: Diagnosis not present

## 2012-02-26 NOTE — Progress Notes (Signed)
Subjective:     Patient ID: [name], female   DOB: April 26, 1919, 78 y.o.   MRN: [id number]  HPI She is here for another followup status post right breast lumpectomy for invasive breast cancer. She is doing well and has no complaints.  Review of Systems     Objective:   Physical Exam    On exam, her incision in the right breast is well-healed. There are no palpable breast masses. There is no right axillary lymphadenopathy Assessment:     Followup right breast lumpectomy for invasive cancer    Plan:     I will see her back in 6 months for recheck.  she will continue self examinations

## 2012-04-30 ENCOUNTER — Ambulatory Visit (INDEPENDENT_AMBULATORY_CARE_PROVIDER_SITE_OTHER): Payer: Medicare Other | Admitting: Family Medicine

## 2012-04-30 ENCOUNTER — Encounter: Payer: Self-pay | Admitting: Family Medicine

## 2012-04-30 VITALS — BP 137/85 | HR 65 | Temp 98.2°F | Ht 65.75 in | Wt 168.8 lb

## 2012-04-30 DIAGNOSIS — E039 Hypothyroidism, unspecified: Secondary | ICD-10-CM

## 2012-04-30 DIAGNOSIS — E119 Type 2 diabetes mellitus without complications: Secondary | ICD-10-CM | POA: Diagnosis not present

## 2012-04-30 DIAGNOSIS — I10 Essential (primary) hypertension: Secondary | ICD-10-CM | POA: Diagnosis not present

## 2012-04-30 DIAGNOSIS — E785 Hyperlipidemia, unspecified: Secondary | ICD-10-CM | POA: Diagnosis not present

## 2012-04-30 LAB — BASIC METABOLIC PANEL
BUN: 28 mg/dL — ABNORMAL HIGH (ref 6–23)
Calcium: 9.6 mg/dL (ref 8.4–10.5)
Chloride: 106 mEq/L (ref 96–112)
Creatinine, Ser: 1.3 mg/dL — ABNORMAL HIGH (ref 0.4–1.2)

## 2012-04-30 LAB — LIPID PANEL
Cholesterol: 136 mg/dL (ref 0–200)
Triglycerides: 142 mg/dL (ref 0.0–149.0)
VLDL: 28.4 mg/dL (ref 0.0–40.0)

## 2012-04-30 LAB — HEPATIC FUNCTION PANEL
ALT: 17 U/L (ref 0–35)
Total Bilirubin: 1.2 mg/dL (ref 0.3–1.2)

## 2012-04-30 LAB — TSH: TSH: 0.28 u[IU]/mL — ABNORMAL LOW (ref 0.35–5.50)

## 2012-04-30 NOTE — Progress Notes (Signed)
  Subjective:    Patient ID: [name], female    DOB: 1919/05/22, 78 y.o.   MRN: [id number]  HPI HTN- chronic problem, well controlled on Norvasc, Micardis HCT.  No CP, SOB, HAs, visual changes, edema.  Stopped the Metolazone due to urinary frequency.  Hyperlipidemia- chronic problem, on Lipitor.  Denies abd pain, N/V, myalgias  DM- chronic problem, doing well on Januvia and Amaryl.  Denies symptomatic lows.  UTD on eye exam.  Denies numbness/tingling of hands/feet.  Hypothyroid- chronic problem, on synthroid.  Last check showed low TSH but normal T3/T4.   Review of Systems For ROS see HPI     Objective:   Physical Exam  Vitals reviewed. Constitutional: She is oriented to person, place, and time. She appears well-developed and well-nourished. No distress.  HENT:  Head: Normocephalic and atraumatic.  Eyes: Conjunctivae and EOM are normal. Pupils are equal, round, and reactive to light.  Neck: Normal range of motion. Neck supple. No thyromegaly present.  Cardiovascular: Normal rate, regular rhythm, normal heart sounds and intact distal pulses.   No murmur heard. Pulmonary/Chest: Effort normal and breath sounds normal. No respiratory distress.  Abdominal: Soft. She exhibits no distension. There is no tenderness.  Musculoskeletal: She exhibits no edema.  Lymphadenopathy:    She has no cervical adenopathy.  Neurological: She is alert and oriented to person, place, and time.  Skin: Skin is warm and dry.  Psychiatric: She has a normal mood and affect. Her behavior is normal.          Assessment & Plan:

## 2012-04-30 NOTE — Patient Instructions (Addendum)
Schedule your complete physical for after 12/18 We'll notify you of your lab results and make any changes if needed You look great!!  Keep up the good work! Call with any questions or concerns Have a great summer (and birthday if i don't see you!!!)

## 2012-04-30 NOTE — Assessment & Plan Note (Signed)
Chronic problem.  Well controlled.  Asymptomatic.  No changes. 

## 2012-04-30 NOTE — Assessment & Plan Note (Signed)
Chronic problem.  Last labs showed abnormal TSH but T3/T4 were normal.  Recheck labs today.

## 2012-04-30 NOTE — Assessment & Plan Note (Signed)
Chronic problem.  Tolerating statin w/out difficulty.  Check labs.  Adjust meds prn  

## 2012-04-30 NOTE — Assessment & Plan Note (Signed)
Chronic problem.  Typically well controlled.  Tolerating meds w/out difficulty.  UTD on eye exam.  Check labs.  Adjust meds prn

## 2012-05-04 ENCOUNTER — Encounter: Payer: Self-pay | Admitting: *Deleted

## 2012-05-28 ENCOUNTER — Other Ambulatory Visit (HOSPITAL_BASED_OUTPATIENT_CLINIC_OR_DEPARTMENT_OTHER): Payer: Medicare Other | Admitting: Lab

## 2012-05-28 DIAGNOSIS — C50119 Malignant neoplasm of central portion of unspecified female breast: Secondary | ICD-10-CM | POA: Diagnosis not present

## 2012-05-28 DIAGNOSIS — C50911 Malignant neoplasm of unspecified site of right female breast: Secondary | ICD-10-CM

## 2012-05-28 DIAGNOSIS — C50919 Malignant neoplasm of unspecified site of unspecified female breast: Secondary | ICD-10-CM | POA: Diagnosis not present

## 2012-05-28 LAB — COMPREHENSIVE METABOLIC PANEL
BUN: 25 mg/dL — ABNORMAL HIGH (ref 6–23)
CO2: 23 mEq/L (ref 19–32)
Creatinine, Ser: 1.46 mg/dL — ABNORMAL HIGH (ref 0.50–1.10)
Glucose, Bld: 185 mg/dL — ABNORMAL HIGH (ref 70–99)
Total Bilirubin: 0.5 mg/dL (ref 0.3–1.2)

## 2012-05-28 LAB — CBC WITH DIFFERENTIAL/PLATELET
Eosinophils Absolute: 0.1 10*3/uL (ref 0.0–0.5)
HCT: 37 % (ref 34.8–46.6)
LYMPH%: 22.1 % (ref 14.0–49.7)
MCV: 96.4 fL (ref 79.5–101.0)
MONO#: 0.4 10*3/uL (ref 0.1–0.9)
NEUT#: 4.7 10*3/uL (ref 1.5–6.5)
NEUT%: 69.9 % (ref 38.4–76.8)
Platelets: 197 10*3/uL (ref 145–400)
WBC: 6.7 10*3/uL (ref 3.9–10.3)

## 2012-06-03 ENCOUNTER — Telehealth: Payer: Self-pay | Admitting: Oncology

## 2012-06-03 NOTE — Telephone Encounter (Signed)
lmonvm adviisng the pt of her r/s 06/04/2012 appt to the following week due to the md's schedule

## 2012-06-04 ENCOUNTER — Ambulatory Visit: Payer: Medicare Other | Admitting: Physician Assistant

## 2012-06-11 ENCOUNTER — Telehealth: Payer: Self-pay | Admitting: Oncology

## 2012-06-11 ENCOUNTER — Encounter: Payer: Self-pay | Admitting: Physician Assistant

## 2012-06-11 ENCOUNTER — Ambulatory Visit (HOSPITAL_BASED_OUTPATIENT_CLINIC_OR_DEPARTMENT_OTHER): Payer: Medicare Other | Admitting: Physician Assistant

## 2012-06-11 VITALS — BP 126/61 | HR 67 | Temp 97.9°F | Ht 65.75 in | Wt 169.8 lb

## 2012-06-11 DIAGNOSIS — C50911 Malignant neoplasm of unspecified site of right female breast: Secondary | ICD-10-CM

## 2012-06-11 DIAGNOSIS — C50919 Malignant neoplasm of unspecified site of unspecified female breast: Secondary | ICD-10-CM

## 2012-06-11 NOTE — Telephone Encounter (Signed)
gve the pt her jan 2014 appt calendar. S/w blanca from [facility] and the pt is aware that she has been put on a recall list for an appt with dr blackmon in oct. Schedules were not out yet for that month.

## 2012-06-11 NOTE — Progress Notes (Signed)
ID: [name]   DOB: Nov 17, 1967  MR#: [id number]  [id number]  HISTORY OF PRESENT ILLNESS: [name] had routine screening mammography 09/26/2011 at [facility] showing a possible abnormality in the right mid breast. She was recalled for additional views November 12, and underwent bilateral diagnostic mammography and right ultrasonography. This showed an irregular ill-defined low-density mass in the right breast without associated microcalcifications. On ultrasound this was heterogeneous, predominantly hypoechoic, with ill-defined borders, and measured 1.9 cm. Biopsy of the mass was performed the same day, and showed (SAA 12-21124.1) an invasive ductal breast cancer, e-cadherin positive, grade 2, estrogen receptor 83% and progesterone receptor 94% positive, with an MIB-1-1 of 90%, but no HER-2 amplification by CISH.   With this information the patient was set up for bilateral breast MRI, performed November 19. This showed a 3 cm area of irregular confluent non-masslike enhancement in the outer central right breast. There were no other suspicious areas in either breast and no evidence of adenopathy in the axillae or internal mammary areas. The patient's case was discussed at the multidisciplinary breast cancer conference and a preliminary plan was suggested, namely starting with surgery, considering anti-estrogens, and likely foregoing radiation. The patient then proceeded to definitive right lumpectomy 11/06/2011 with results as discussed below  INTERVAL HISTORY: [name] returns today accompanied by her daughter, [name], for routine six-month followup of her right breast carcinoma. Interval history is unremarkable and [name] tells me she "feels as good as ever".  She continues to be followed regularly through Dr. Virgil Benedict office for her diabetes and kidney function.  REVIEW OF SYSTEMS: [name] has had no recent illnesses and denies any fevers or chills. No night sweats. She has a little runny nose and  occasionally her throat is sore. She attributes this to "sitting under a fan". No significant cough and no shortness of breath. No chest pain. Nausea or change in bowel habits. No abnormal headaches or dizziness.  She has occasional back pain which is not new and has not worsened.  A detailed review of systems is otherwise noncontributory.   PAST MEDICAL HISTORY: Past Medical History  Diagnosis Date  . Diabetes mellitus, type 2   . Gout   . Hyperlipidemia   . Hyperthyroidism   . Cancer     right breast  . Anxiety     loss of child 1 year ago  . Hypertension     states eccho Dr Mare Ferrari 1 year ago- no record found in EPIC or office records    PAST SURGICAL HISTORY: Past Surgical History  Procedure Date  . Rotator cuff repair 1999    right  . Abdominal hysterectomy 1995  . Cholecystectomy 1976  . Breast lumpectomy 11/06/2011    Procedure: LUMPECTOMY;  Surgeon: Harl Bowie, MD;  Location: [facility];  Service: General;  Laterality: Right;    FAMILY HISTORY Family History  Problem Relation Age of Onset  . Cancer Neg Hx   . Diabetes Neg Hx   . Hypertension Neg Hx   . Stroke Neg Hx   The patient's father died from a stroke at the age of 72 the patient's mother died at age 84 possibly from complications of bladder cancer. The patient had 2 brothers and 2 sisters. There is no evidence of breast or ovarian cancer in the family.      GYNECOLOGIC HISTORY: Menarche age 56, menopause age 7. She never took hormone replacement. She is GX P2, first live birth age 78  SOCIAL HISTORY: The patient is a  widow and lives by herself. Her daughter [name] lives across the street with her own family. 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 The patient has 3 grandchildren and 3 great-grandchildren; she attends [employer].   ADVANCED DIRECTIVES:  HEALTH  MAINTENANCE: History  Substance Use Topics  . Smoking status: Never Smoker   . Smokeless tobacco: Never Used  . Alcohol Use: No     Colonoscopy: Never  PAP: S/P Hysterectomy  Bone density: Never  Lipid panel: Followed by Dr. Birdie Riddle  No Known Allergies  Current Outpatient Prescriptions  Medication Sig Dispense Refill  . acebutolol (SECTRAL) 400 MG capsule Take 1 capsule (400 mg total) by mouth at bedtime.  90 capsule  3  . allopurinol (ZYLOPRIM) 100 MG tablet Take 1 tablet (100 mg total) by mouth daily.  90 tablet  3  . ALPRAZolam (XANAX) 0.5 MG tablet Take 0.5 mg by mouth at bedtime as needed. Sleep       . amLODipine (NORVASC) 5 MG tablet Take 1 tablet (5 mg total) by mouth every morning.  90 tablet  3  . atorvastatin (LIPITOR) 10 MG tablet Take 1 tablet (10 mg total) by mouth at bedtime.  90 tablet  3  . clonazePAM (KLONOPIN) 0.5 MG tablet Take 1 tablet (0.5 mg total) by mouth at bedtime as needed. Anxiety  90 tablet  0  . glimepiride (AMARYL) 4 MG tablet Take 1 tablet (4 mg total) by mouth every morning.  90 tablet  3  . glucose blood test strip Use as instructed  100 each  12  . levothyroxine (SYNTHROID, LEVOTHROID) 100 MCG tablet Take 1 tablet (100 mcg total) by mouth every morning.  90 tablet  3  . metolazone (ZAROXOLYN) 2.5 MG tablet Take 1 tablet (2.5 mg total) by mouth every morning. 0.5 TABLET EVERY 3 DAYS   90 tablet  3  . sitaGLIPtin (JANUVIA) 50 MG tablet Take 1 tablet (50 mg total) by mouth every morning.  90 tablet  3  . telmisartan-hydrochlorothiazide (MICARDIS HCT) 80-12.5 MG per tablet Take 1 tablet by mouth every morning.  90 tablet  3    OBJECTIVE: Elderly white female, appearing younger than her stated age of 78, who appears comfortable and in no acute distress. Filed Vitals:   06/11/12 1143  BP: 126/61  Pulse: 67  Temp: 97.9 F (36.6 C)     Body mass index is 27.62 kg/(m^2).    ECOG FS: 1  Filed Weights   06/11/12 1143  Weight: 169 lb 12.8 oz (77.021  kg)   Physical Exam: HEENT:  Sclerae anicteric.  Oropharynx clear.  No  Nodes:  No cervical, supraclavicular, or axillary lymphadenopathy palpated.  Breast Exam:  Right breast is status post lumpectomy with well-healed incision. No suspicious nodularities or skin changes. No evidence of local recurrence. Left breast is benign no masses, skin changes, or nipple inversion.   Lungs:  Clear to auscultation bilaterally.  No crackles, rhonchi, or wheezes.   Heart:  Regular rate and rhythm.  No gallops or murmurs. Abdomen:  Soft, nontender.  Positive bowel sounds.  No organomegaly or masses palpated.   Musculoskeletal:  No focal spinal tenderness to palpation.  Extremities:  Benign.  No peripheral edema or cyanosis.   Skin:  Benign.   Neuro:  Nonfocal. Alert and oriented x3.    LAB RESULTS:  Lab Results  Component Value Date   WBC 6.7 05/28/2012   NEUTROABS 4.7 05/28/2012   HGB 12.6 05/28/2012   HCT 37.0 05/28/2012   MCV 96.4 05/28/2012   PLT 197 05/28/2012      Chemistry      Component Value Date/Time   NA 139 05/28/2012 0946   K 3.9 05/28/2012 0946   CL 103 05/28/2012 0946   CO2 23 05/28/2012 0946   BUN 25* 05/28/2012 0946   CREATININE 1.46* 05/28/2012 0946      Component Value Date/Time   CALCIUM 9.2 05/28/2012 0946   ALKPHOS 65 05/28/2012 0946   AST 18 05/28/2012 0946   ALT 16 05/28/2012 0946   BILITOT 0.5 05/28/2012 0946       Lab Results  Component Value Date   LABCA2 26 10/09/2011    STUDIES: No results found.  ASSESSMENT: 78 y.o.  [place] woman   (1)  status post right lumpectomy 11/06/2011 for a T2 NX, Stage II invasive ductal carcinoma, grade 2, strongly estrogen and progesterone receptor positive, with a very elevated MIB-1, but no HER-2 amplification.   (2) declined antiestrogen therapy, followed with observation alone.   PLAN: [name] continues to do remarkably well, and we will continue with her current regimen. Specifically, we will follow her with observation  alone. She will see Dr. Rush Farmer in October, and will return to see [place] in 6 months, January of 2014. She knows to call prior to that time, however, with any changes or problems.  Hattie Pine    06/11/2012

## 2012-07-13 DIAGNOSIS — H25099 Other age-related incipient cataract, unspecified eye: Secondary | ICD-10-CM | POA: Diagnosis not present

## 2012-07-24 DIAGNOSIS — H2510 Age-related nuclear cataract, unspecified eye: Secondary | ICD-10-CM | POA: Diagnosis not present

## 2012-08-03 DIAGNOSIS — H2510 Age-related nuclear cataract, unspecified eye: Secondary | ICD-10-CM | POA: Diagnosis not present

## 2012-08-03 DIAGNOSIS — H2589 Other age-related cataract: Secondary | ICD-10-CM | POA: Diagnosis not present

## 2012-08-14 DIAGNOSIS — H2510 Age-related nuclear cataract, unspecified eye: Secondary | ICD-10-CM | POA: Diagnosis not present

## 2012-08-17 DIAGNOSIS — H2510 Age-related nuclear cataract, unspecified eye: Secondary | ICD-10-CM | POA: Diagnosis not present

## 2012-08-18 LAB — HM DIABETES EYE EXAM

## 2012-08-25 ENCOUNTER — Ambulatory Visit (INDEPENDENT_AMBULATORY_CARE_PROVIDER_SITE_OTHER): Payer: Medicare Other | Admitting: Surgery

## 2012-08-25 ENCOUNTER — Encounter (INDEPENDENT_AMBULATORY_CARE_PROVIDER_SITE_OTHER): Payer: Self-pay | Admitting: Surgery

## 2012-08-25 VITALS — BP 141/83 | HR 76 | Temp 97.2°F | Resp 14 | Ht 67.0 in | Wt 166.0 lb

## 2012-08-25 DIAGNOSIS — Z853 Personal history of malignant neoplasm of breast: Secondary | ICD-10-CM

## 2012-08-25 NOTE — Progress Notes (Signed)
Subjective:     Patient ID: [name], female   DOB: 11-24-1918, 78 y.o.   MRN: [id number]  HPI She is here for long-term followup of her right breast cancer. She had a lumpectomy but no other therapy. She is doing well and has no complaints. She denies nipple discharge  Review of Systems     Objective:   Physical Exam On exam, her right breast incision is well healed. There are no palpable masses in the breast and no right axillary lymphadenopathy    Assessment:     Long-term followup right breast cancer    Plan:     I will see her back in a year. She will be following up with the oncologist in 6 months. She will continue her self examinations

## 2012-08-26 ENCOUNTER — Other Ambulatory Visit: Payer: Self-pay | Admitting: Physician Assistant

## 2012-08-26 ENCOUNTER — Other Ambulatory Visit: Payer: Self-pay | Admitting: Emergency Medicine

## 2012-08-26 DIAGNOSIS — Z853 Personal history of malignant neoplasm of breast: Secondary | ICD-10-CM

## 2012-08-27 ENCOUNTER — Telehealth: Payer: Self-pay | Admitting: Oncology

## 2012-08-27 NOTE — Telephone Encounter (Signed)
Mailed the pt her mammogram appt at [facility] for nov

## 2012-09-17 ENCOUNTER — Ambulatory Visit (INDEPENDENT_AMBULATORY_CARE_PROVIDER_SITE_OTHER): Payer: Medicare Other | Admitting: *Deleted

## 2012-09-17 DIAGNOSIS — Z23 Encounter for immunization: Secondary | ICD-10-CM | POA: Diagnosis not present

## 2012-09-28 DIAGNOSIS — Z853 Personal history of malignant neoplasm of breast: Secondary | ICD-10-CM | POA: Diagnosis not present

## 2012-10-02 ENCOUNTER — Encounter: Payer: Self-pay | Admitting: Cardiology

## 2012-10-22 ENCOUNTER — Telehealth: Payer: Self-pay | Admitting: *Deleted

## 2012-10-22 MED ORDER — TELMISARTAN-HCTZ 80-12.5 MG PO TABS: 1.0000 | ORAL_TABLET | ORAL | Status: DC

## 2012-10-22 MED ORDER — ATORVASTATIN CALCIUM 10 MG PO TABS: 10.0000 mg | ORAL_TABLET | Freq: Every day | ORAL | Status: DC

## 2012-10-22 MED ORDER — LEVOTHYROXINE SODIUM 100 MCG PO TABS: 100.0000 ug | ORAL_TABLET | ORAL | Status: DC

## 2012-10-22 MED ORDER — ALLOPURINOL 100 MG PO TABS: 100.0000 mg | ORAL_TABLET | Freq: Every day | ORAL | Status: DC

## 2012-10-22 MED ORDER — SITAGLIPTIN PHOSPHATE 50 MG PO TABS: 50.0000 mg | ORAL_TABLET | ORAL | Status: DC

## 2012-10-22 MED ORDER — ACEBUTOLOL HCL 400 MG PO CAPS: 400.0000 mg | ORAL_CAPSULE | Freq: Every day | ORAL | Status: DC

## 2012-10-22 MED ORDER — AMLODIPINE BESYLATE 5 MG PO TABS: 5.0000 mg | ORAL_TABLET | ORAL | Status: DC

## 2012-10-22 MED ORDER — GLIMEPIRIDE 4 MG PO TABS: 4.0000 mg | ORAL_TABLET | ORAL | Status: DC

## 2012-10-22 NOTE — Telephone Encounter (Signed)
Message copied by Harl Bowie on Thu Oct 22, 2012  2:41 PM ------      Message from: Midge Minium      Created: Wed Oct 21, 2012  3:26 PM      Contact: Daughter [name]       Please send refills for meds, 90 day supply w/ 3 refills      ----- Message -----         From: Chilton Greathouse, RN         Sent: 10/21/2012   3:22 PM           To: Midge Minium, MD                        ----- Message -----         From: [name]         Sent: 10/21/2012   3:16 PM           To: Chilton Greathouse, RN            Cb# [phone]      Will need 1-yrs worth of refills on all meds except for the "Water Pill" as she no longer takes that medication. Gets thru [place] and will be out by CPE schedule 1.29.14            Please review and advise      Thanks

## 2012-10-22 NOTE — Telephone Encounter (Signed)
Spoke with the pt's daughter [name]) and informed her that her mother's meds will be refilled, and will be ready on Mon.  Daughter asked if the Rx's would be mailed to the pt's address and [facility] will [facility] to [facility].//AB/CMA

## 2012-10-26 NOTE — Telephone Encounter (Signed)
rx faxed [phone]

## 2012-12-10 ENCOUNTER — Other Ambulatory Visit (HOSPITAL_BASED_OUTPATIENT_CLINIC_OR_DEPARTMENT_OTHER): Payer: Medicare Other | Admitting: Lab

## 2012-12-10 DIAGNOSIS — C50119 Malignant neoplasm of central portion of unspecified female breast: Secondary | ICD-10-CM | POA: Diagnosis not present

## 2012-12-10 DIAGNOSIS — C50911 Malignant neoplasm of unspecified site of right female breast: Secondary | ICD-10-CM

## 2012-12-10 DIAGNOSIS — C50919 Malignant neoplasm of unspecified site of unspecified female breast: Secondary | ICD-10-CM | POA: Diagnosis not present

## 2012-12-10 LAB — CBC WITH DIFFERENTIAL/PLATELET
BASO%: 0.6 % (ref 0.0–2.0)
LYMPH%: 20.3 % (ref 14.0–49.7)
MCHC: 35.2 g/dL (ref 31.5–36.0)
MONO#: 0.6 10*3/uL (ref 0.1–0.9)
Platelets: 196 10*3/uL (ref 145–400)
RBC: 3.95 10*6/uL (ref 3.70–5.45)
RDW: 12.6 % (ref 11.2–14.5)
WBC: 7.6 10*3/uL (ref 3.9–10.3)

## 2012-12-10 LAB — COMPREHENSIVE METABOLIC PANEL (CC13)
ALT: 13 U/L (ref 0–55)
AST: 15 U/L (ref 5–34)
Calcium: 10.2 mg/dL (ref 8.4–10.4)
Chloride: 104 mEq/L (ref 98–107)
Creatinine: 1.5 mg/dL — ABNORMAL HIGH (ref 0.6–1.1)
Sodium: 141 mEq/L (ref 136–145)
Total Bilirubin: 0.61 mg/dL (ref 0.20–1.20)

## 2012-12-10 LAB — CANCER ANTIGEN 27.29: CA 27.29: 37 U/mL (ref 0–39)

## 2012-12-16 ENCOUNTER — Ambulatory Visit (INDEPENDENT_AMBULATORY_CARE_PROVIDER_SITE_OTHER): Payer: Medicare Other | Admitting: Family Medicine

## 2012-12-16 ENCOUNTER — Encounter: Payer: Self-pay | Admitting: Family Medicine

## 2012-12-16 VITALS — BP 130/60 | HR 67 | Temp 97.7°F | Ht 66.0 in | Wt 173.4 lb

## 2012-12-16 DIAGNOSIS — E785 Hyperlipidemia, unspecified: Secondary | ICD-10-CM

## 2012-12-16 DIAGNOSIS — I10 Essential (primary) hypertension: Secondary | ICD-10-CM

## 2012-12-16 DIAGNOSIS — E039 Hypothyroidism, unspecified: Secondary | ICD-10-CM | POA: Diagnosis not present

## 2012-12-16 DIAGNOSIS — E119 Type 2 diabetes mellitus without complications: Secondary | ICD-10-CM | POA: Diagnosis not present

## 2012-12-16 DIAGNOSIS — Z0000 Encounter for general adult medical examination without abnormal findings: Secondary | ICD-10-CM

## 2012-12-16 LAB — LDL CHOLESTEROL, DIRECT: Direct LDL: 86.6 mg/dL

## 2012-12-16 LAB — BASIC METABOLIC PANEL
Calcium: 9.8 mg/dL (ref 8.4–10.5)
Creatinine, Ser: 1.5 mg/dL — ABNORMAL HIGH (ref 0.4–1.2)
GFR: 33.79 mL/min — ABNORMAL LOW (ref >60.00)

## 2012-12-16 LAB — LIPID PANEL
HDL: 44.6 mg/dL (ref >39.00)
VLDL: 41.8 mg/dL — ABNORMAL HIGH (ref 0.0–40.0)

## 2012-12-16 LAB — CBC WITH DIFFERENTIAL/PLATELET
Basophils Absolute: 0 10*3/uL (ref 0.0–0.1)
Eosinophils Absolute: 0.2 10*3/uL (ref 0.0–0.7)
HCT: 39.4 % (ref 36.0–46.0)
Hemoglobin: 13.4 g/dL (ref 12.0–15.0)
Lymphs Abs: 1.6 10*3/uL (ref 0.7–4.0)
MCHC: 33.9 g/dL (ref 30.0–36.0)
Neutro Abs: 5.1 10*3/uL (ref 1.4–7.7)
RDW: 12.8 % (ref 11.5–14.6)

## 2012-12-16 LAB — HEPATIC FUNCTION PANEL
AST: 18 U/L (ref 0–37)
Alkaline Phosphatase: 61 U/L (ref 39–117)
Total Bilirubin: 0.8 mg/dL (ref 0.3–1.2)

## 2012-12-16 MED ORDER — LEVOTHYROXINE SODIUM 88 MCG PO TABS: 88.0000 ug | ORAL_TABLET | Freq: Every day | ORAL | Status: DC

## 2012-12-16 NOTE — Assessment & Plan Note (Signed)
Chronic problem.  Asymptomatic.  UTD on eye exam.  Foot exam done today.  Check labs.  Adjust meds prn

## 2012-12-16 NOTE — Assessment & Plan Note (Signed)
Chronic problem.  Well controlled.  Asymptomatic.  No anticipated changes.

## 2012-12-16 NOTE — Assessment & Plan Note (Signed)
Chronic problem.  Asymptomatic.  Check labs.  Adjust meds prn  

## 2012-12-16 NOTE — Patient Instructions (Addendum)
Follow up in 6 months to recheck diabetes, blood pressure and cholesterol Keep up the good work!  You look great! We'll notify you of your lab results Call with any questions or concerns Be Safe!!

## 2012-12-16 NOTE — Assessment & Plan Note (Signed)
Pt's PE WNL.  UTD on mammo, declines DEXA.  No need for pap or colonoscopy.  Check labs.  Anticipatory guidance provided.

## 2012-12-16 NOTE — Progress Notes (Signed)
  Subjective:    Patient ID: [name], female    DOB: 24-Dec-1918, 79 y.o.   MRN: [id number]  HPI Here today for CPE.  Risk Factors: HTN- chronic problem, well controlled, on acebutolol, Micardis HCT, Norvasc.  No CP, SOB, HAs, visual changes, edema DM- chronic problem, on Januvia.  No symptomatic lows, no weakness/numbness. Hyperlipidemia- chronic problem, on Lipitor.  Denies abd pain, N/V, myalgias Physical Activity: walking regularly Fall Risk: low risk, steady on feet Depression: no current sxs Hearing: normal to conversational tones, mildly decreased to whispered voice despite use of hearing aides ADL's: independent Cognitive: normal linear thought process, memory and attention intact Home Safety: safe at home, lives alone, daughter lives across the street Height, Weight, BMI, Visual Acuity: see vitals, vision corrected to 20/20 w/ glasses and cataract surgery Counseling: no need for colonoscopy, pap.  UTD on mammo.  Not interested in DEXA Labs Ordered: See A&P Care Plan: See A&P    Review of Systems Patient reports no vision/ hearing changes, adenopathy,fever, weight change,  persistant/recurrent hoarseness , swallowing issues, chest pain, palpitations, edema, persistant/recurrent cough, hemoptysis, dyspnea (rest/exertional/paroxysmal nocturnal), gastrointestinal bleeding (melena, rectal bleeding), abdominal pain, significant heartburn, bowel changes, GU symptoms (dysuria, hematuria, incontinence), Gyn symptoms (abnormal  bleeding, pain),  syncope, focal weakness, memory loss, numbness & tingling, skin/hair/nail changes, abnormal bruising or bleeding, anxiety, or depression.     Objective:   Physical Exam General Appearance:    Alert, cooperative, no distress, appears younger than stated age  Head:    Normocephalic, without obvious abnormality, atraumatic  Eyes:    PERRL, conjunctiva/corneas clear, EOM's intact, fundi    benign, both eyes  Ears:    Normal TM's and external  ear canals, both ears  Nose:   Nares normal, septum midline, mucosa normal, no drainage    or sinus tenderness  Throat:   Lips, mucosa, and tongue normal; teeth and gums normal  Neck:   Supple, symmetrical, trachea midline, no adenopathy;    Thyroid: no enlargement/tenderness/nodules  Back:     Symmetric, no curvature, ROM normal, no CVA tenderness  Lungs:     Clear to auscultation bilaterally, respirations unlabored  Chest Wall:    No tenderness or deformity   Heart:    Regular rate and rhythm, S1 and S2 normal, no murmur, rub   or gallop  Breast Exam:    Deferred   Abdomen:     Soft, non-tender, bowel sounds active all four quadrants,    no masses, no organomegaly  Genitalia:    Deferred  Rectal:    Extremities:   Extremities normal, atraumatic, no cyanosis or edema  Pulses:   2+ and symmetric all extremities  Skin:   Skin color, texture, turgor normal, no rashes or lesions  Lymph nodes:   Cervical, supraclavicular, and axillary nodes normal  Neurologic:   CNII-XII intact, normal strength, sensation and reflexes    throughout          Assessment & Plan:

## 2012-12-16 NOTE — Assessment & Plan Note (Signed)
Chronic problem, tolerating meds w/out difficulty.  Check labs.  Adjust meds prn

## 2012-12-17 ENCOUNTER — Ambulatory Visit (HOSPITAL_BASED_OUTPATIENT_CLINIC_OR_DEPARTMENT_OTHER): Payer: Medicare Other | Admitting: Oncology

## 2012-12-17 ENCOUNTER — Telehealth: Payer: Self-pay | Admitting: Oncology

## 2012-12-17 VITALS — BP 135/74 | HR 70 | Temp 97.5°F | Resp 20 | Ht 66.0 in | Wt 172.8 lb

## 2012-12-17 DIAGNOSIS — C50119 Malignant neoplasm of central portion of unspecified female breast: Secondary | ICD-10-CM

## 2012-12-17 DIAGNOSIS — Z170 Estrogen receptor positive status [ER+]: Secondary | ICD-10-CM

## 2012-12-17 DIAGNOSIS — C50911 Malignant neoplasm of unspecified site of right female breast: Secondary | ICD-10-CM

## 2012-12-17 NOTE — Telephone Encounter (Signed)
appts made and printed for pt aom °

## 2012-12-17 NOTE — Progress Notes (Signed)
ID: [name]   DOB: Nov 03, 1919  MR#: [id number]  [id number]  PCP: Annye Asa, MD GYN: SUCoralie Keens OTHER MD: Darlin Coco, Ron Gioffrey  HISTORY OF PRESENT ILLNESS: [name] had routine screening mammography 09/26/2011 at [facility] showing a possible abnormality in the right mid breast. She was recalled for additional views November 12, and underwent bilateral diagnostic mammography and right ultrasonography. This showed an irregular ill-defined low-density mass in the right breast without associated microcalcifications. On ultrasound this was heterogeneous, predominantly hypoechoic, with ill-defined borders, and measured 1.9 cm. Biopsy of the mass was performed the same day, and showed (SAA 12-21124.1) an invasive ductal breast cancer, e-cadherin positive, grade 2, estrogen receptor 83% and progesterone receptor 94% positive, with an MIB-1-1 of 90%, but no HER-2 amplification by CISH.   With this information the patient was set up for bilateral breast MRI, performed November 19. This showed a 3 cm area of irregular confluent non-masslike enhancement in the outer central right breast. There were no other suspicious areas in either breast and no evidence of adenopathy in the axillae or internal mammary areas. The patient's case was discussed at the multidisciplinary breast cancer conference and a preliminary plan was suggested, namely starting with surgery, considering anti-estrogens, and likely foregoing radiation. The patient then proceeded to definitive right lumpectomy 11/06/2011 with results as discussed below  INTERVAL HISTORY: [name] returns today accompanied by her daughter, [name], for followup of her right breast carcinoma. Interval history is unremarkable and [name] enjoyed the holidays. Since her last visit here she had bilateral cataract surgery, and now she can see a bit better when she drives to the church or grocery store.  REVIEW OF SYSTEMS: [name] she has a little  bit of back pain when she is standing for a long time or doing a lot of kitchen activities. She sits down and that goes away and then she goes on. She's had no unusual headaches, nausea, vomiting, cough, phlegm production, or change in bowel or bladder habits. She has a good sense of balance, she tells me and there have been no falls. A detailed review of systems today was otherwise noncontributory.  PAST MEDICAL HISTORY: Past Medical History  Diagnosis Date  . Diabetes mellitus, type 2   . Gout   . Hyperlipidemia   . Hyperthyroidism   . Cancer     right breast  . Anxiety     loss of child 1 year ago  . Hypertension     states eccho Dr Mare Ferrari 1 year ago- no record found in EPIC or office records    PAST SURGICAL HISTORY: Past Surgical History  Procedure Date  . Rotator cuff repair 1999    right  . Abdominal hysterectomy 1995  . Cholecystectomy 1976  . Breast lumpectomy 11/06/2011    Procedure: LUMPECTOMY;  Surgeon: Harl Bowie, MD;  Location: [facility];  Service: General;  Laterality: Right;    FAMILY HISTORY Family History  Problem Relation Age of Onset  . Cancer Neg Hx   . Diabetes Neg Hx   . Hypertension Neg Hx   . Stroke Neg Hx   The patient's father died from a stroke at the age of 41 the patient's mother died at age 82 possibly from complications of bladder cancer. The patient had 2 brothers and 2 sisters. There is no evidence of breast or ovarian cancer in the family.   GYNECOLOGIC HISTORY: Menarche age 68, menopause age 62. She never took hormone replacement. She is GX P2,  first live birth age 48  SOCIAL HISTORY: The patient is a widow and lives by herself. Her daughter [name] lives across the street with her own family. 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 The patient has 3 grandchildren and 3 great-grandchildren; she attends [employer].   ADVANCED DIRECTIVES:  HEALTH MAINTENANCE: History  Substance Use Topics  . Smoking status: Never Smoker   . Smokeless tobacco: Never Used  . Alcohol Use: No     Colonoscopy: Never  PAP: S/P Hysterectomy  Bone density: Never  Lipid panel: Followed by Dr. Birdie Riddle  No Known Allergies  Current Outpatient Prescriptions  Medication Sig Dispense Refill  . acebutolol (SECTRAL) 400 MG capsule Take 1 capsule (400 mg total) by mouth at bedtime.  90 capsule  3  . allopurinol (ZYLOPRIM) 100 MG tablet Take 1 tablet (100 mg total) by mouth daily.  90 tablet  3  . amLODipine (NORVASC) 5 MG tablet Take 1 tablet (5 mg total) by mouth every morning.  90 tablet  3  . atorvastatin (LIPITOR) 10 MG tablet Take 1 tablet (10 mg total) by mouth at bedtime.  90 tablet  3  . glimepiride (AMARYL) 4 MG tablet Take 1 tablet (4 mg total) by mouth every morning.  90 tablet  3  . glucose blood test strip Use as instructed  100 each  12  . levothyroxine (SYNTHROID, LEVOTHROID) 88 MCG tablet Take 1 tablet (88 mcg total) by mouth daily.  90 tablet  3  . sitaGLIPtin (JANUVIA) 50 MG tablet Take 1 tablet (50 mg total) by mouth every morning.  90 tablet  3  . telmisartan-hydrochlorothiazide (MICARDIS HCT) 80-12.5 MG per tablet Take 1 tablet by mouth every morning.  90 tablet  3    OBJECTIVE: Elderly white woman in no acute distress Filed Vitals:   12/17/12 1122  BP: 135/74  Pulse: 70  Temp: 97.5 F (36.4 C)  Resp: 20     Body mass index is 27.89 kg/(m^2).    ECOG FS: 1  Filed Weights   12/17/12 1122  Weight: 172 lb 12.8 oz (78.382 kg)   Sclerae unicteric Oropharynx clear No cervical or supraclavicular adenopathy Lungs no rales or rhonchi Heart regular rate and rhythm Abd benign MSK no focal spinal tenderness, no peripheral edema Neuro: nonfocal Breasts: The right breast is status post lumpectomy. There is no evidence of local recurrence. The right axilla is clear. The left breast is  unremarkable.   LAB RESULTS: Lab Results  Component Value Date   WBC 7.4 12/16/2012   NEUTROABS 5.1 12/16/2012   HGB 13.4 12/16/2012   HCT 39.4 12/16/2012   MCV 97.1 12/16/2012   PLT 236.0 12/16/2012      Chemistry      Component Value Date/Time   NA 141 12/16/2012 1108   NA 141 12/10/2012 1145   K 5.1 12/16/2012 1108   K 4.3 12/10/2012 1145   CL 106 12/16/2012 1108   CL 104 12/10/2012 1145   CO2 26 12/16/2012 1108   CO2 28 12/10/2012 1145   BUN 30* 12/16/2012 1108   BUN 27.0* 12/10/2012 1145   CREATININE 1.5* 12/16/2012 1108   CREATININE 1.5* 12/10/2012 1145      Component Value Date/Time   CALCIUM 9.8 12/16/2012 1108   CALCIUM 10.2 12/10/2012 1145   ALKPHOS 61 12/16/2012 1108  ALKPHOS 73 12/10/2012 1145   AST 18 12/16/2012 1108   AST 15 12/10/2012 1145   ALT 16 12/16/2012 1108   ALT 13 12/10/2012 1145   BILITOT 0.8 12/16/2012 1108   BILITOT 0.61 12/10/2012 1145       Lab Results  Component Value Date   LABCA2 37 12/10/2012    STUDIES: No results found.   ASSESSMENT: 79 y.o.  [place] woman   (1)  status post right lumpectomy 11/06/2011 for a T2 NX, Stage II invasive ductal carcinoma, grade 2, strongly estrogen and progesterone receptor positive, with a very elevated MIB-1, but no HER-2 amplification.   (2) declined antiestrogen therapy, followed with observation alone.  PLAN: [name] shows no signs or symptoms of recurrent or metastatic breast cancer. The plan is for continued observation. She will see her primary care doctor and Dr. Rush Farmer in 4-8 months. She'll see [place] again in one year. We will continue to follow her yearly until she completes her 5 years from her original surgery.   Today we did discuss of some of the things that should make her 1 to call the doctor, including problems with diarrhea, cough, phlegm production, or fever, of or balance issues or falls. She knows to call for any problems that may develop before next visit.  Ibraheem Voris C    12/17/2012

## 2012-12-25 ENCOUNTER — Telehealth: Payer: Self-pay | Admitting: Family Medicine

## 2012-12-25 NOTE — Telephone Encounter (Signed)
Patient needs new rx for tru track test strips sent to [facility].

## 2012-12-28 ENCOUNTER — Telehealth: Payer: Self-pay | Admitting: *Deleted

## 2012-12-28 NOTE — Telephone Encounter (Signed)
Patients needs script for Tru Track Test Strips to be faxed to [facility] mail order pharmacy. Last script sent 01/06/12 #100 with 12 RF. Fax number [phone]

## 2012-12-28 NOTE — Telephone Encounter (Signed)
Tyronza for refill x1 yr

## 2012-12-29 ENCOUNTER — Other Ambulatory Visit: Payer: Self-pay | Admitting: *Deleted

## 2012-12-29 DIAGNOSIS — E119 Type 2 diabetes mellitus without complications: Secondary | ICD-10-CM

## 2012-12-29 MED ORDER — GLUCOSE BLOOD VI STRP: ORAL_STRIP | Status: DC

## 2012-12-29 NOTE — Telephone Encounter (Signed)
Rx for test strips faxed to [place]

## 2013-03-17 ENCOUNTER — Other Ambulatory Visit (INDEPENDENT_AMBULATORY_CARE_PROVIDER_SITE_OTHER): Payer: Medicare Other

## 2013-03-17 DIAGNOSIS — R946 Abnormal results of thyroid function studies: Secondary | ICD-10-CM

## 2013-03-17 DIAGNOSIS — R7989 Other specified abnormal findings of blood chemistry: Secondary | ICD-10-CM

## 2013-03-17 LAB — TSH: TSH: 0.2 u[IU]/mL — ABNORMAL LOW (ref 0.35–5.50)

## 2013-03-19 ENCOUNTER — Telehealth: Payer: Self-pay

## 2013-03-19 NOTE — Telephone Encounter (Signed)
See lab encounter,patient with 100 mcg tab from a previous rx. Patient will take 1/2 by mouth daily to equal out to 50 mcg

## 2013-03-19 NOTE — Telephone Encounter (Signed)
Message copied by Logan Bores on Fri Mar 19, 2013 11:03 AM ------      Message from: Midge Minium      Created: Wed Mar 17, 2013  4:24 PM       TSH is again low- needs to decrease Synthroid to 13mcg daily.  Will repeat at [facility] in 3 months ------

## 2013-06-10 ENCOUNTER — Ambulatory Visit (INDEPENDENT_AMBULATORY_CARE_PROVIDER_SITE_OTHER): Payer: Medicare Other | Admitting: Family Medicine

## 2013-06-10 ENCOUNTER — Encounter: Payer: Self-pay | Admitting: Family Medicine

## 2013-06-10 VITALS — BP 140/70 | HR 67 | Temp 98.1°F | Ht 66.0 in | Wt 182.4 lb

## 2013-06-10 DIAGNOSIS — L989 Disorder of the skin and subcutaneous tissue, unspecified: Secondary | ICD-10-CM | POA: Diagnosis not present

## 2013-06-10 DIAGNOSIS — I10 Essential (primary) hypertension: Secondary | ICD-10-CM

## 2013-06-10 DIAGNOSIS — E785 Hyperlipidemia, unspecified: Secondary | ICD-10-CM

## 2013-06-10 DIAGNOSIS — E119 Type 2 diabetes mellitus without complications: Secondary | ICD-10-CM | POA: Diagnosis not present

## 2013-06-10 LAB — LIPID PANEL
HDL: 46.1 mg/dL (ref >39.00)
Triglycerides: 226 mg/dL — ABNORMAL HIGH (ref 0.0–149.0)
VLDL: 45.2 mg/dL — ABNORMAL HIGH (ref 0.0–40.0)

## 2013-06-10 LAB — BASIC METABOLIC PANEL
CO2: 27 mEq/L (ref 19–32)
Calcium: 9.8 mg/dL (ref 8.4–10.5)
Creatinine, Ser: 1.5 mg/dL — ABNORMAL HIGH (ref 0.4–1.2)

## 2013-06-10 LAB — HEPATIC FUNCTION PANEL
Alkaline Phosphatase: 56 U/L (ref 39–117)
Bilirubin, Direct: 0.1 mg/dL (ref 0.0–0.3)
Total Bilirubin: 0.7 mg/dL (ref 0.3–1.2)

## 2013-06-10 LAB — HM DIABETES FOOT EXAM

## 2013-06-10 LAB — HEMOGLOBIN A1C: Hgb A1c MFr Bld: 6.5 % (ref 4.6–6.5)

## 2013-06-10 NOTE — Assessment & Plan Note (Signed)
Chronic problem, typically well controlled.  UTD on eye exam.  Foot exam performed today.  Asymptomatic.  Check labs.  Adjust meds prn

## 2013-06-10 NOTE — Progress Notes (Signed)
  Subjective:    Patient ID: [name], female    DOB: 1919/03/05, 79 y.o.   MRN: [id number]  HPI HTN- chronic problem, well controlled on norvasc, Micardis HCT, acebutolol.  No CP, SOB, HAs, visual changes, edema.  Hyperlipidemia- chronic problem, on Lipitor.  Denies abd pain, N/V, myalgias  DM- chronic problem, on Januvia, Amaryl.  CBG 110 this AM.  UTD on eye exam (had cataract surgery Oct 2013).  No weakness/numbness hands/feet.  Skin lesion- R forehead, pt reports this used to have a crust/scab on it but this has come off and now the area is raw, red, and will occasionally ooze  Review of Systems For ROS see HPI     Objective:   Physical Exam  Vitals reviewed. Constitutional: She is oriented to person, place, and time. She appears well-developed and well-nourished. No distress.  HENT:  Head: Normocephalic and atraumatic.  Eyes: Conjunctivae and EOM are normal. Pupils are equal, round, and reactive to light.  Neck: Normal range of motion. Neck supple. No thyromegaly present.  Cardiovascular: Normal rate, regular rhythm, normal heart sounds and intact distal pulses.   No murmur heard. Pulmonary/Chest: Effort normal and breath sounds normal. No respiratory distress.  Abdominal: Soft. She exhibits no distension. There is no tenderness.  Musculoskeletal: She exhibits no edema.  Lymphadenopathy:    She has no cervical adenopathy.  Neurological: She is alert and oriented to person, place, and time.  Skin: Skin is warm and dry.  2 cm circular lesion on R forehead  Psychiatric: She has a normal mood and affect. Her behavior is normal.          Assessment & Plan:

## 2013-06-10 NOTE — Assessment & Plan Note (Signed)
New.  Refer to derm as this is concerning for non-melanoma skin cancer

## 2013-06-10 NOTE — Patient Instructions (Addendum)
Schedule your complete physical in 6 months We'll notify you of your lab results and make any changes if needed We'll call you with Derm appt Keep up the good work!  You look great!! Happy Early Rudene Anda!!!

## 2013-06-10 NOTE — Assessment & Plan Note (Signed)
Chronic problem.  Tolerating statin w/out difficulty.  Check labs.  Adjust meds prn  

## 2013-06-10 NOTE — Assessment & Plan Note (Signed)
Chronic problem, well controlled.  Asymptomatic.  Check labs.  No anticipated med changes. °

## 2013-06-24 ENCOUNTER — Telehealth: Payer: Self-pay | Admitting: *Deleted

## 2013-06-24 MED ORDER — TELMISARTAN 80 MG PO TABS: 80.0000 mg | ORAL_TABLET | Freq: Every day | ORAL | Status: DC

## 2013-06-24 NOTE — Telephone Encounter (Signed)
Spoke with the pt's daughter(Susan) and informed her of the pt's recent lab results and note.  Daughter understood and agreed.  New rx sent to the pharmacy by e-script.//AB/CMA

## 2013-06-24 NOTE — Telephone Encounter (Signed)
Message copied by Harl Bowie on Thu Jun 24, 2013  4:38 PM ------      Message from: Midge Minium      Created: Fri Jun 11, 2013  8:08 AM       Labs look good w/ exception of kidney function.  Please make sure she is drinking plenty of water.  Also should switch from Micardis HCT to plain Micardis to improve kidney fxn ------

## 2013-06-28 ENCOUNTER — Telehealth: Payer: Self-pay | Admitting: Family Medicine

## 2013-06-28 NOTE — Telephone Encounter (Signed)
[name] called stating they need Micardis sent to [facility].

## 2013-07-08 ENCOUNTER — Other Ambulatory Visit: Payer: Self-pay | Admitting: Dermatology

## 2013-07-08 DIAGNOSIS — C44319 Basal cell carcinoma of skin of other parts of face: Secondary | ICD-10-CM | POA: Diagnosis not present

## 2013-07-20 ENCOUNTER — Other Ambulatory Visit: Payer: Self-pay

## 2013-07-20 DIAGNOSIS — E119 Type 2 diabetes mellitus without complications: Secondary | ICD-10-CM

## 2013-07-21 ENCOUNTER — Other Ambulatory Visit: Payer: Self-pay

## 2013-07-21 ENCOUNTER — Telehealth: Payer: Self-pay

## 2013-07-21 DIAGNOSIS — I10 Essential (primary) hypertension: Secondary | ICD-10-CM

## 2013-07-21 DIAGNOSIS — E119 Type 2 diabetes mellitus without complications: Secondary | ICD-10-CM

## 2013-07-21 MED ORDER — TELMISARTAN 80 MG PO TABS: 80.0000 mg | ORAL_TABLET | Freq: Every day | ORAL | Status: DC

## 2013-07-21 MED ORDER — GLUCOSE BLOOD VI STRP: ORAL_STRIP | Status: DC

## 2013-07-21 NOTE — Telephone Encounter (Signed)
[name] presented to the lobby for medications to be sent to [facility]. Needed new Micardis and Test Strips sent in. Escribed both to pharmacy.  Advised daughter and to let us know if she did not get a timely response.

## 2013-08-17 DIAGNOSIS — Z23 Encounter for immunization: Secondary | ICD-10-CM | POA: Diagnosis not present

## 2013-08-18 LAB — HM DIABETES EYE EXAM

## 2013-08-19 ENCOUNTER — Other Ambulatory Visit: Payer: Self-pay | Admitting: Dermatology

## 2013-08-19 DIAGNOSIS — C44319 Basal cell carcinoma of skin of other parts of face: Secondary | ICD-10-CM | POA: Diagnosis not present

## 2013-08-19 DIAGNOSIS — Z85828 Personal history of other malignant neoplasm of skin: Secondary | ICD-10-CM | POA: Diagnosis not present

## 2013-08-19 DIAGNOSIS — L570 Actinic keratosis: Secondary | ICD-10-CM | POA: Diagnosis not present

## 2013-08-25 ENCOUNTER — Encounter (INDEPENDENT_AMBULATORY_CARE_PROVIDER_SITE_OTHER): Payer: Self-pay | Admitting: Surgery

## 2013-08-25 ENCOUNTER — Ambulatory Visit (INDEPENDENT_AMBULATORY_CARE_PROVIDER_SITE_OTHER): Payer: Medicare Other | Admitting: Surgery

## 2013-08-25 VITALS — BP 128/74 | HR 72 | Temp 97.2°F | Resp 14 | Ht 66.0 in | Wt 184.4 lb

## 2013-08-25 DIAGNOSIS — Z853 Personal history of malignant neoplasm of breast: Secondary | ICD-10-CM

## 2013-08-25 NOTE — Progress Notes (Signed)
Subjective:     Patient ID: [name], female   DOB: [birth year], 79 y.o.   MRN: [id number]  HPI She is here for a long-term history follow up of right breast cancer. She is approximately 2 years since surgery. She was a stage II cancer and is on no antiestrogen therapy. She has no complaints and is doing well  Review of Systems     Objective:   Physical Exam On exam, her breast is soft and there are no palpable masses. The incision is well-healed the right. There is no axillary adenopathy    Assessment:     Stable with a long-term history of right breast cancer     Plan:     She is due for mammogram 6 months. I will see her back in a year unless there are problems on the mammogram. She will continue to be followed by the cancer center as well

## 2013-09-29 ENCOUNTER — Other Ambulatory Visit: Payer: Self-pay | Admitting: *Deleted

## 2013-09-29 DIAGNOSIS — E119 Type 2 diabetes mellitus without complications: Secondary | ICD-10-CM

## 2013-09-29 MED ORDER — GLUCOSE BLOOD VI STRP: ORAL_STRIP | Status: DC

## 2013-09-29 MED ORDER — ATORVASTATIN CALCIUM 10 MG PO TABS: 10.0000 mg | ORAL_TABLET | Freq: Every day | ORAL | Status: DC

## 2013-09-29 MED ORDER — ALLOPURINOL 100 MG PO TABS: 100.0000 mg | ORAL_TABLET | Freq: Every day | ORAL | Status: DC

## 2013-09-29 MED ORDER — AMLODIPINE BESYLATE 5 MG PO TABS: 5.0000 mg | ORAL_TABLET | ORAL | Status: DC

## 2013-09-29 MED ORDER — LEVOTHYROXINE SODIUM 100 MCG PO TABS: 100.0000 ug | ORAL_TABLET | Freq: Every day | ORAL | Status: DC

## 2013-09-29 MED ORDER — GLIMEPIRIDE 4 MG PO TABS: 4.0000 mg | ORAL_TABLET | ORAL | Status: DC

## 2013-09-29 MED ORDER — ACEBUTOLOL HCL 400 MG PO CAPS: 400.0000 mg | ORAL_CAPSULE | Freq: Every day | ORAL | Status: DC

## 2013-09-29 MED ORDER — SITAGLIPTIN PHOSPHATE 50 MG PO TABS: 50.0000 mg | ORAL_TABLET | ORAL | Status: DC

## 2013-09-30 DIAGNOSIS — L259 Unspecified contact dermatitis, unspecified cause: Secondary | ICD-10-CM | POA: Diagnosis not present

## 2013-09-30 DIAGNOSIS — Z85828 Personal history of other malignant neoplasm of skin: Secondary | ICD-10-CM | POA: Diagnosis not present

## 2013-10-01 DIAGNOSIS — Z1231 Encounter for screening mammogram for malignant neoplasm of breast: Secondary | ICD-10-CM | POA: Diagnosis not present

## 2013-10-07 ENCOUNTER — Encounter: Payer: Self-pay | Admitting: General Practice

## 2013-11-09 ENCOUNTER — Telehealth: Payer: Self-pay | Admitting: Oncology

## 2013-11-09 ENCOUNTER — Other Ambulatory Visit: Payer: Self-pay | Admitting: Physician Assistant

## 2013-11-09 NOTE — Telephone Encounter (Signed)
S/w the pt and she is aware of her r/s appts from 12/20/2013 to 12/22/2013 due to ab on pal.

## 2013-12-02 DIAGNOSIS — L570 Actinic keratosis: Secondary | ICD-10-CM | POA: Diagnosis not present

## 2013-12-14 ENCOUNTER — Telehealth: Payer: Self-pay

## 2013-12-14 NOTE — Telephone Encounter (Addendum)
Medication and allergies:  Reviewed and updated  90 day supply/mail order: Meds by [facility] pharmacy:  [facility], [place]   Immunizations due:  Tdap and Pneumonia upon appt.     A/P: No changes to personal, family history or past surgical hx PAP-Hysterectomy [facility]-No need for colonoscopy per records [facility]- 10/01/13- [facility]-normal BD-Pt. not interested  Flu- 07/2013 Tdap-Due PNA-Due Shingles- 11/2011 per pt.   To Discuss with Provider: Received documents from [facility] requesting signatures from a doctor stating patient is still ok to drive.

## 2013-12-16 ENCOUNTER — Encounter: Payer: Self-pay | Admitting: Family Medicine

## 2013-12-16 ENCOUNTER — Ambulatory Visit (INDEPENDENT_AMBULATORY_CARE_PROVIDER_SITE_OTHER): Payer: Medicare Other | Admitting: Family Medicine

## 2013-12-16 VITALS — BP 128/72 | HR 68 | Temp 98.0°F | Resp 16 | Ht 66.0 in | Wt 184.0 lb

## 2013-12-16 DIAGNOSIS — I10 Essential (primary) hypertension: Secondary | ICD-10-CM

## 2013-12-16 DIAGNOSIS — Z23 Encounter for immunization: Secondary | ICD-10-CM

## 2013-12-16 DIAGNOSIS — E039 Hypothyroidism, unspecified: Secondary | ICD-10-CM | POA: Diagnosis not present

## 2013-12-16 DIAGNOSIS — Z0000 Encounter for general adult medical examination without abnormal findings: Secondary | ICD-10-CM

## 2013-12-16 DIAGNOSIS — E785 Hyperlipidemia, unspecified: Secondary | ICD-10-CM | POA: Diagnosis not present

## 2013-12-16 DIAGNOSIS — E119 Type 2 diabetes mellitus without complications: Secondary | ICD-10-CM | POA: Diagnosis not present

## 2013-12-16 LAB — HEPATIC FUNCTION PANEL
ALK PHOS: 68 U/L (ref 39–117)
ALT: 16 U/L (ref 0–35)
AST: 18 U/L (ref 0–37)
Albumin: 4.1 g/dL (ref 3.5–5.2)
Bilirubin, Direct: 0.1 mg/dL (ref 0.0–0.3)
TOTAL PROTEIN: 7.5 g/dL (ref 6.0–8.3)
Total Bilirubin: 1.1 mg/dL (ref 0.3–1.2)

## 2013-12-16 LAB — BASIC METABOLIC PANEL
BUN: 17 mg/dL (ref 6–23)
CO2: 26 meq/L (ref 19–32)
CREATININE: 1.3 mg/dL — AB (ref 0.4–1.2)
Calcium: 9.7 mg/dL (ref 8.4–10.5)
Chloride: 103 mEq/L (ref 96–112)
GFR: 39.63 mL/min — ABNORMAL LOW (ref >60.00)
Glucose, Bld: 122 mg/dL — ABNORMAL HIGH (ref 70–99)
Potassium: 3.9 mEq/L (ref 3.5–5.1)
Sodium: 138 mEq/L (ref 135–145)

## 2013-12-16 LAB — CBC WITH DIFFERENTIAL/PLATELET
BASOS PCT: 0.4 % (ref 0.0–3.0)
Basophils Absolute: 0 10*3/uL (ref 0.0–0.1)
Eosinophils Absolute: 0.1 10*3/uL (ref 0.0–0.7)
Eosinophils Relative: 1.2 % (ref 0.0–5.0)
HCT: 42.3 % (ref 36.0–46.0)
HEMOGLOBIN: 14 g/dL (ref 12.0–15.0)
LYMPHS PCT: 21.3 % (ref 12.0–46.0)
Lymphs Abs: 2 10*3/uL (ref 0.7–4.0)
MCHC: 33.2 g/dL (ref 30.0–36.0)
MCV: 99.8 fl (ref 78.0–100.0)
MONOS PCT: 6 % (ref 3.0–12.0)
Monocytes Absolute: 0.6 10*3/uL (ref 0.1–1.0)
NEUTROS ABS: 6.6 10*3/uL (ref 1.4–7.7)
Neutrophils Relative %: 71.1 % (ref 43.0–77.0)
Platelets: 232 10*3/uL (ref 150.0–400.0)
RBC: 4.24 Mil/uL (ref 3.87–5.11)
RDW: 12.9 % (ref 11.5–14.6)
WBC: 9.3 10*3/uL (ref 4.5–10.5)

## 2013-12-16 LAB — LIPID PANEL
CHOL/HDL RATIO: 4
Cholesterol: 173 mg/dL (ref 0–200)
HDL: 45.4 mg/dL (ref >39.00)
LDL Cholesterol: 92 mg/dL (ref 0–99)
Triglycerides: 177 mg/dL — ABNORMAL HIGH (ref 0.0–149.0)
VLDL: 35.4 mg/dL (ref 0.0–40.0)

## 2013-12-16 LAB — TSH: TSH: 2.32 u[IU]/mL (ref 0.35–5.50)

## 2013-12-16 LAB — HEMOGLOBIN A1C: Hgb A1c MFr Bld: 6.5 % (ref 4.6–6.5)

## 2013-12-16 NOTE — Patient Instructions (Signed)
Follow up in 6 months to recheck diabetes and cholesterol We'll notify you of your lab results and make any changes if needed Keep up the good work!  You look great! Call with any questions or concerns Happy New Year!!!

## 2013-12-16 NOTE — Progress Notes (Signed)
   Subjective:    Patient ID: [name], female    DOB: Jul 16, 1919, 80 y.o.   MRN: [id number]  HPI Here today for CPE.  Risk Factors: DM- chronic problem, on Januvia, Amaryl.  On ARB.  UTD on eye exam.  CBG this AM 117.  No weakness/numbness hands/feet.  No symptomatic lows HTN- chronic problem, on acebutolol, amlodipine, Micardis HCT.  Well controlled.  No CP, SOB, HAs, visual changes, edema. Hyperlipidemia- chronic problem, on Lipitor Physical Activity: walking regularly Fall Risk: low risk Depression: no current sxs Hearing: wearing hearing aides bilaterally ADL's: independent Cognitive: normal linear thought process, memory and attention intact Home Safety: lives alone, across the street from daughter Height, Weight, BMI, Visual Acuity: see vitals, vision corrected to 20/20 w/ glasses Counseling: no need for colonoscopy, pap.  UTD on mammo.  Declines DEXA Labs Ordered: See A&P Care Plan: See A&P   Review of Systems Patient reports no vision/ hearing changes, adenopathy,fever, weight change,  persistant/recurrent hoarseness , swallowing issues, chest pain, palpitations, edema, persistant/recurrent cough, hemoptysis, dyspnea (rest/exertional/paroxysmal nocturnal), gastrointestinal bleeding (melena, rectal bleeding), abdominal pain, significant heartburn, bowel changes, GU symptoms (dysuria, hematuria, incontinence), Gyn symptoms (abnormal  bleeding, pain),  syncope, focal weakness, memory loss, numbness & tingling, skin/hair/nail changes, abnormal bruising or bleeding, anxiety, or depression.     Objective:   Physical Exam General Appearance:    Alert, cooperative, no distress, appears stated age  Head:    Normocephalic, without obvious abnormality, atraumatic  Eyes:    PERRL, conjunctiva/corneas clear, EOM's intact, fundi    benign, both eyes  Ears:    Normal TM's and external ear canals, both ears  Nose:   Nares normal, septum midline, mucosa normal, no drainage    or sinus  tenderness  Throat:   Lips, mucosa, and tongue normal; teeth and gums normal  Neck:   Supple, symmetrical, trachea midline, no adenopathy;    Thyroid: no enlargement/tenderness/nodules  Back:     Symmetric, no curvature, ROM normal, no CVA tenderness  Lungs:     Clear to auscultation bilaterally, respirations unlabored  Chest Wall:    No tenderness or deformity   Heart:    Regular rate and rhythm, S1 and S2 normal, no murmur, rub   or gallop  Breast Exam:    Deferred to mammo  Abdomen:     Soft, non-tender, bowel sounds active all four quadrants,    no masses, no organomegaly  Genitalia:    Deferred  Rectal:    Extremities:   Extremities normal, atraumatic, no cyanosis or edema  Pulses:   2+ and symmetric all extremities  Skin:   Skin color, texture, turgor normal, no rashes or lesions  Lymph nodes:   Cervical, supraclavicular, and axillary nodes normal  Neurologic:   CNII-XII intact, normal strength, sensation and reflexes    throughout          Assessment & Plan:

## 2013-12-16 NOTE — Progress Notes (Signed)
Pre-visit discussion using our clinic review tool, as applicable. No additional management support is needed unless otherwise documented below in the visit note.  

## 2013-12-17 ENCOUNTER — Encounter: Payer: Self-pay | Admitting: General Practice

## 2013-12-17 ENCOUNTER — Telehealth: Payer: Self-pay | Admitting: Family Medicine

## 2013-12-17 NOTE — Telephone Encounter (Signed)
Relevant patient education mailed to patient.  

## 2013-12-19 NOTE — Assessment & Plan Note (Signed)
Chronic problem.  Adequate control.  Asymptomatic.  Check labs.  No anticipated med changes 

## 2013-12-19 NOTE — Assessment & Plan Note (Signed)
Pt's PE WNL.  UTD on health maintenance.  Check labs.  Anticipatory guidance provided.  

## 2013-12-19 NOTE — Assessment & Plan Note (Signed)
Chronic problem.  Typically well controlled.  Currently asymptomatic.  Check labs.  Adjust meds prn

## 2013-12-19 NOTE — Assessment & Plan Note (Signed)
Chronic problem.  Currently asymptomatic.  Check labs.  Adjust med prn.

## 2013-12-19 NOTE — Assessment & Plan Note (Signed)
Chronic problem.  Tolerating statin w/o difficulty.  Check labs.  Adjust meds prn  

## 2013-12-20 ENCOUNTER — Ambulatory Visit: Payer: Medicare Other | Admitting: Physician Assistant

## 2013-12-20 ENCOUNTER — Other Ambulatory Visit: Payer: Medicare Other

## 2013-12-21 ENCOUNTER — Other Ambulatory Visit: Payer: Self-pay | Admitting: Physician Assistant

## 2013-12-21 DIAGNOSIS — C50911 Malignant neoplasm of unspecified site of right female breast: Secondary | ICD-10-CM

## 2013-12-22 ENCOUNTER — Encounter: Payer: Self-pay | Admitting: Physician Assistant

## 2013-12-22 ENCOUNTER — Other Ambulatory Visit (HOSPITAL_BASED_OUTPATIENT_CLINIC_OR_DEPARTMENT_OTHER): Payer: Medicare Other

## 2013-12-22 ENCOUNTER — Telehealth: Payer: Self-pay | Admitting: Oncology

## 2013-12-22 ENCOUNTER — Ambulatory Visit (HOSPITAL_BASED_OUTPATIENT_CLINIC_OR_DEPARTMENT_OTHER): Payer: Medicare Other | Admitting: Physician Assistant

## 2013-12-22 VITALS — BP 158/72 | HR 69 | Temp 98.3°F | Resp 18 | Ht 66.0 in | Wt 184.5 lb

## 2013-12-22 DIAGNOSIS — Z170 Estrogen receptor positive status [ER+]: Secondary | ICD-10-CM

## 2013-12-22 DIAGNOSIS — C50911 Malignant neoplasm of unspecified site of right female breast: Secondary | ICD-10-CM

## 2013-12-22 DIAGNOSIS — E119 Type 2 diabetes mellitus without complications: Secondary | ICD-10-CM

## 2013-12-22 DIAGNOSIS — C50119 Malignant neoplasm of central portion of unspecified female breast: Secondary | ICD-10-CM

## 2013-12-22 DIAGNOSIS — Z853 Personal history of malignant neoplasm of breast: Secondary | ICD-10-CM

## 2013-12-22 DIAGNOSIS — L989 Disorder of the skin and subcutaneous tissue, unspecified: Secondary | ICD-10-CM

## 2013-12-22 LAB — COMPREHENSIVE METABOLIC PANEL (CC13)
ALBUMIN: 3.8 g/dL (ref 3.5–5.0)
ALT: 15 U/L (ref 0–55)
AST: 15 U/L (ref 5–34)
Alkaline Phosphatase: 78 U/L (ref 40–150)
Anion Gap: 9 mEq/L (ref 3–11)
BUN: 19.5 mg/dL (ref 7.0–26.0)
CALCIUM: 9.9 mg/dL (ref 8.4–10.4)
CHLORIDE: 103 meq/L (ref 98–109)
CO2: 26 meq/L (ref 22–29)
Creatinine: 1.4 mg/dL — ABNORMAL HIGH (ref 0.6–1.1)
Glucose: 172 mg/dl — ABNORMAL HIGH (ref 70–140)
POTASSIUM: 4.2 meq/L (ref 3.5–5.1)
SODIUM: 138 meq/L (ref 136–145)
TOTAL PROTEIN: 7 g/dL (ref 6.4–8.3)
Total Bilirubin: 0.64 mg/dL (ref 0.20–1.20)

## 2013-12-22 LAB — CBC WITH DIFFERENTIAL/PLATELET
BASO%: 0.8 % (ref 0.0–2.0)
BASOS ABS: 0.1 10*3/uL (ref 0.0–0.1)
EOS ABS: 0.1 10*3/uL (ref 0.0–0.5)
EOS%: 1.6 % (ref 0.0–7.0)
HCT: 39.8 % (ref 34.8–46.6)
HGB: 13.5 g/dL (ref 11.6–15.9)
LYMPH%: 20.9 % (ref 14.0–49.7)
MCH: 33 pg (ref 25.1–34.0)
MCHC: 33.8 g/dL (ref 31.5–36.0)
MCV: 97.4 fL (ref 79.5–101.0)
MONO#: 0.5 10*3/uL (ref 0.1–0.9)
MONO%: 6.8 % (ref 0.0–14.0)
NEUT%: 69.9 % (ref 38.4–76.8)
NEUTROS ABS: 5.4 10*3/uL (ref 1.5–6.5)
Platelets: 215 10*3/uL (ref 145–400)
RBC: 4.09 10*6/uL (ref 3.70–5.45)
RDW: 12.8 % (ref 11.2–14.5)
WBC: 7.8 10*3/uL (ref 3.9–10.3)
lymph#: 1.6 10*3/uL (ref 0.9–3.3)

## 2013-12-22 NOTE — Progress Notes (Signed)
ID: [name]   DOB: 1919-01-08  MR#: [id number]  KDX#:[id number]  PCP: Annye Asa, MD GYN: SU: Coralie Keens OTHER MD: Darlin Coco, Ron Branson West, Nena Polio  HISTORY OF PRESENT ILLNESS: [name] had routine screening mammography 09/26/2011 at [facility] showing a possible abnormality in the right mid breast. She was recalled for additional views November 12, and underwent bilateral diagnostic mammography and right ultrasonography. This showed an irregular ill-defined low-density mass in the right breast without associated microcalcifications. On ultrasound this was heterogeneous, predominantly hypoechoic, with ill-defined borders, and measured 1.9 cm. Biopsy of the mass was performed the same day, and showed ([id number]) an invasive ductal breast cancer, e-cadherin positive, grade 2, estrogen receptor 83% and progesterone receptor 94% positive, with an MIB-1-1 of 90%, but no HER-2 amplification by CISH.   With this information the patient was set up for bilateral breast MRI, performed November 19. This showed a 3 cm area of irregular confluent non-masslike enhancement in the outer central right breast. There were no other suspicious areas in either breast and no evidence of adenopathy in the axillae or internal mammary areas. The patient's case was discussed at the multidisciplinary breast cancer conference and a preliminary plan was suggested, namely starting with surgery, considering anti-estrogens, and likely foregoing radiation. The patient then proceeded to definitive right lumpectomy 11/06/2011 with results as discussed below  INTERVAL HISTORY: [name] returns today accompanied by her daughter, [name], for followup of her right breast carcinoma. [name] is doing very well, and had no new complaints today. She did have some cancerous lesions on her face which are being treated by Dr. Allyson Sabal.  Otherwise, interval history is unremarkable.  [name] still lives alone, although her daughter  [name] lives across the Street. [name] is able to take care of all of her day-to-day needs and still does her own housework.  She is followed closely by Dr. Birdie Riddle for comorbidities including diabetes, hyperlipidemia, and hypertension.    REVIEW OF SYSTEMS: [name] denies any recent illnesses and has had no fevers, chills, or night sweats. Her energy level is fair, and she denies any problems sleeping. She's had no abnormal bruising or bleeding. Her appetite is good and she denies any nausea or recent change in bowel or bladder habits. She's had no increased cough, shortness of breath, chest pain, or palpitations. She denies any unusual myalgias, arthralgias, bony pain, or peripheral swelling. She also denies any abnormal headaches or dizziness. She is very careful when she walks to avoid falling.  A detailed review of systems is otherwise stable and noncontributory.   PAST MEDICAL HISTORY: Past Medical History  Diagnosis Date  . Diabetes mellitus, type 2   . Gout   . Hyperlipidemia   . Hyperthyroidism   . Cancer     right breast  . Anxiety     loss of child 1 year ago  . Hypertension     states eccho Dr Mare Ferrari 1 year ago- no record found in EPIC or office records    PAST SURGICAL HISTORY: Past Surgical History  Procedure Laterality Date  . Rotator cuff repair  1999    right  . Abdominal hysterectomy  1995  . Cholecystectomy  1976  . Breast lumpectomy  11/06/2011    Procedure: LUMPECTOMY;  Surgeon: Harl Bowie, MD;  Location: [facility];  Service: General;  Laterality: Right;    FAMILY HISTORY Family History  Problem Relation Age of Onset  . Cancer Neg Hx   . Diabetes Neg Hx   .  Hypertension Neg Hx   . Stroke Neg Hx   The patient's father died from a stroke at the age of 92 the patient's mother died at age 56 possibly from complications of bladder cancer. The patient had 2 brothers and 2 sisters. There is no evidence of breast or ovarian cancer in the family.    GYNECOLOGIC HISTORY: Menarche age 63, menopause age 56. She never took hormone replacement. She is GX P2, first live birth age 45  SOCIAL HISTORY:  (Updated February 2015) The patient is a widow and lives by herself. Her daughter [name] lives across the street with her own family. 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 Luepke only worked outside of the home when there was a [employer] here in [place] during [place]). The patient has 3 grandchildren and 3 great-grandchildren; she attends [employer].   ADVANCED DIRECTIVES:  HEALTH MAINTENANCE: (Updated February 2015) History  Substance Use Topics  . Smoking status: Never Smoker   . Smokeless tobacco: Never Used  . Alcohol Use: No     Colonoscopy: Never  PAP: S/P Hysterectomy  Bone density: Never  Lipid panel: January 2015, Dr. Birdie Riddle   No Known Allergies  Current Outpatient Prescriptions  Medication Sig Dispense Refill  . acebutolol (SECTRAL) 400 MG capsule Take 1 capsule (400 mg total) by mouth at bedtime.  90 capsule  3  . allopurinol (ZYLOPRIM) 100 MG tablet Take 1 tablet (100 mg total) by mouth daily.  90 tablet  3  . amLODipine (NORVASC) 5 MG tablet Take 1 tablet (5 mg total) by mouth every morning.  90 tablet  3  . atorvastatin (LIPITOR) 10 MG tablet Take 1 tablet (10 mg total) by mouth at bedtime.  90 tablet  3  . glimepiride (AMARYL) 4 MG tablet Take 1 tablet (4 mg total) by mouth every morning.  90 tablet  3  . glucose blood test strip Use as instructed  100 each  12  . levothyroxine (SYNTHROID, LEVOTHROID) 100 MCG tablet Take 1 tablet (100 mcg total) by mouth daily before breakfast.  90 tablet  3  . sitaGLIPtin (JANUVIA) 50 MG tablet Take 1 tablet (50 mg total) by mouth every morning.  90 tablet  3  . telmisartan-hydrochlorothiazide (MICARDIS HCT) 80-12.5 MG per tablet Take 1 tablet by mouth every morning.  90 tablet  3   No current facility-administered medications for this visit.     OBJECTIVE: Elderly white woman who appears comfortable and as in no acute distress Filed Vitals:   12/22/13 1208  BP: 158/72  Pulse: 69  Temp: 98.3 F (36.8 C)  Resp: 18     Body mass index is 29.79 kg/(m^2).    ECOG FS: 1 Filed Weights   12/22/13 1208  Weight: 184 lb 8 oz (83.689 kg)   Physical Exam: HEENT:  Sclerae anicteric.  Oropharynx clear and moist. Trachea midline, neck supple.  NODES:  No cervical or supraclavicular lymphadenopathy palpated.  BREAST EXAM:  Right breast is status post lumpectomy. No suspicious nodularity or skin changes, and no evidence of local recurrence. Left breast is unremarkable. Axillae are benign, no palpable lymphadenopathy. LUNGS:  Clear to auscultation bilaterally with good excursion.  No wheezes or rhonchi HEART:  Regular rate and rhythm. No murmur or rubs. ABDOMEN:  Soft, nontender. No organomegaly palpated. Positive bowel sounds.  MSK:  No focal spinal tenderness to palpation. Good range of motion bilaterally in the upper extremities. EXTREMITIES:  No  peripheral edema.   SKIN:  Multiple dry and hyperpigmented lesions on the face, undergoing treatment by Dr. Allyson Sabal. Otherwise skin is unremarkable. No pallor. NEURO:  Nonfocal. Well oriented.  Appropriate affect.    LAB RESULTS: Lab Results  Component Value Date   WBC 7.8 12/22/2013   NEUTROABS 5.4 12/22/2013   HGB 13.5 12/22/2013   HCT 39.8 12/22/2013   MCV 97.4 12/22/2013   PLT 215 12/22/2013      Chemistry      Component Value Date/Time   NA 138 12/22/2013 1111   NA 138 12/16/2013 1043   K 4.2 12/22/2013 1111   K 3.9 12/16/2013 1043   CL 103 12/16/2013 1043   CL 104 12/10/2012 1145   CO2 26 12/22/2013 1111   CO2 26 12/16/2013 1043   BUN 19.5 12/22/2013 1111   BUN 17 12/16/2013 1043   CREATININE 1.4* 12/22/2013 1111   CREATININE 1.3* 12/16/2013 1043      Component Value Date/Time   CALCIUM 9.9 12/22/2013 1111   CALCIUM 9.7 12/16/2013 1043   ALKPHOS 78 12/22/2013 1111   ALKPHOS 68 12/16/2013 1043    AST 15 12/22/2013 1111   AST 18 12/16/2013 1043   ALT 15 12/22/2013 1111   ALT 16 12/16/2013 1043   BILITOT 0.64 12/22/2013 1111   BILITOT 1.1 12/16/2013 1043       Lab Results  Component Value Date   LABCA2 37 12/10/2012    STUDIES:  Most recent bilateral mammogram at [facility] on 10/01/2013 was unremarkable.   ASSESSMENT: 80 y.o.  [place] [facility] woman   (1)  status post right lumpectomy 11/06/2011 for a T2 NX, Stage II invasive ductal carcinoma, grade 2, strongly estrogen and progesterone receptor positive, with a very elevated MIB-1, but no HER-2 amplification.   (2) declined antiestrogen therapy, followed with observation alone.  PLAN: [name] seems to be doing quite well with regards to her breast cancer history, with no clinical evidence of disease recurrence. Our plan is to continue seeing her on an annual basis until she completes 5 years of followup.   [name] will have her annual mammogram at [facility] in November, and will see [place] again next January. She'll continue be followed very closely by her primary care physician, Dr.  Birdie Riddle, in the meanwhile, but knows to call us with any changes or problems. [name] and [name] both voice their understanding and agreement with the above.   Shakyla Nolley PA-C    12/22/2013

## 2013-12-22 NOTE — Telephone Encounter (Signed)
, °

## 2013-12-24 DIAGNOSIS — Z0279 Encounter for issue of other medical certificate: Secondary | ICD-10-CM

## 2013-12-28 ENCOUNTER — Telehealth: Payer: Self-pay

## 2013-12-28 NOTE — Telephone Encounter (Signed)
Make copies for my folder, billing, batch and patient. Called patient and she requested that forms be faxed to [facility] and a copy mailed to her. This was completed. Billing copy given to [facility]. JG//CMA

## 2013-12-28 NOTE — Telephone Encounter (Signed)
Forms completed, sent for review and signature by PCP

## 2013-12-28 NOTE — Telephone Encounter (Signed)
Forms signed and placed in folder for [name] Maria

## 2014-01-12 ENCOUNTER — Other Ambulatory Visit: Payer: Self-pay | Admitting: *Deleted

## 2014-01-12 MED ORDER — TELMISARTAN-HCTZ 80-12.5 MG PO TABS: 1.0000 | ORAL_TABLET | ORAL | Status: DC

## 2014-01-27 ENCOUNTER — Telehealth: Payer: Self-pay | Admitting: Family Medicine

## 2014-01-27 DIAGNOSIS — L570 Actinic keratosis: Secondary | ICD-10-CM | POA: Diagnosis not present

## 2014-01-27 NOTE — Telephone Encounter (Signed)
Spike with pt daughter and advised that we could either change back to straight micardis or remain on micardis-HCT. Since lab levels are still good, she would prefer to stay on micardis-hct.

## 2014-01-27 NOTE — Telephone Encounter (Signed)
Pt was changed from Micardis HCT to Micardis in August due to elevated Cr of 1.5.  She was mistakenly switched back to the Micardis HCT in December when meds were reordered.  We can certainly switch back to the plain medication but please let them know that her labs have remained stable despite the change.  Please send new script to [facility] for Micardis 80mg , #90, 3 refills

## 2014-01-27 NOTE — Telephone Encounter (Signed)
[name] called stating the patient has been switched to Micardis 80 mg without the hctz. The last rx we sent in was for Micardis- hctz 80-12.5. She states she thought she was supposed to take the one without the hctz, so is asking for [place] to change the rx and re-send it. If we changed her back to micardis-hctz on purpose then she would like to know why. [place] mail order

## 2014-01-27 NOTE — Telephone Encounter (Signed)
I do not see a note of this from last OV. Please advise.

## 2014-06-14 ENCOUNTER — Ambulatory Visit (INDEPENDENT_AMBULATORY_CARE_PROVIDER_SITE_OTHER): Payer: Medicare Other | Admitting: Family Medicine

## 2014-06-14 ENCOUNTER — Encounter: Payer: Self-pay | Admitting: Family Medicine

## 2014-06-14 VITALS — BP 140/78 | HR 64 | Temp 97.9°F | Resp 17 | Wt 179.0 lb

## 2014-06-14 DIAGNOSIS — E1149 Type 2 diabetes mellitus with other diabetic neurological complication: Secondary | ICD-10-CM

## 2014-06-14 DIAGNOSIS — E785 Hyperlipidemia, unspecified: Secondary | ICD-10-CM

## 2014-06-14 DIAGNOSIS — E119 Type 2 diabetes mellitus without complications: Secondary | ICD-10-CM

## 2014-06-14 DIAGNOSIS — I10 Essential (primary) hypertension: Secondary | ICD-10-CM | POA: Diagnosis not present

## 2014-06-14 DIAGNOSIS — E1142 Type 2 diabetes mellitus with diabetic polyneuropathy: Secondary | ICD-10-CM

## 2014-06-14 DIAGNOSIS — E1140 Type 2 diabetes mellitus with diabetic neuropathy, unspecified: Secondary | ICD-10-CM

## 2014-06-14 LAB — CBC WITH DIFFERENTIAL/PLATELET
BASOS PCT: 0.3 % (ref 0.0–3.0)
Basophils Absolute: 0 10*3/uL (ref 0.0–0.1)
EOS PCT: 1.2 % (ref 0.0–5.0)
Eosinophils Absolute: 0.1 10*3/uL (ref 0.0–0.7)
HCT: 39.5 % (ref 36.0–46.0)
HEMOGLOBIN: 13.3 g/dL (ref 12.0–15.0)
LYMPHS PCT: 20.3 % (ref 12.0–46.0)
Lymphs Abs: 2 10*3/uL (ref 0.7–4.0)
MCHC: 33.6 g/dL (ref 30.0–36.0)
MCV: 97.5 fl (ref 78.0–100.0)
Monocytes Absolute: 0.6 10*3/uL (ref 0.1–1.0)
Monocytes Relative: 6.5 % (ref 3.0–12.0)
NEUTROS ABS: 7.2 10*3/uL (ref 1.4–7.7)
NEUTROS PCT: 71.7 % (ref 43.0–77.0)
Platelets: 265 10*3/uL (ref 150.0–400.0)
RBC: 4.05 Mil/uL (ref 3.87–5.11)
RDW: 12.5 % (ref 11.5–15.5)
WBC: 10 10*3/uL (ref 4.0–10.5)

## 2014-06-14 LAB — HEPATIC FUNCTION PANEL
ALT: 20 U/L (ref 0–35)
AST: 20 U/L (ref 0–37)
Albumin: 4 g/dL (ref 3.5–5.2)
Alkaline Phosphatase: 61 U/L (ref 39–117)
Bilirubin, Direct: 0.1 mg/dL (ref 0.0–0.3)
TOTAL PROTEIN: 7.3 g/dL (ref 6.0–8.3)
Total Bilirubin: 0.9 mg/dL (ref 0.2–1.2)

## 2014-06-14 LAB — BASIC METABOLIC PANEL
BUN: 20 mg/dL (ref 6–23)
CO2: 29 meq/L (ref 19–32)
CREATININE: 1.3 mg/dL — AB (ref 0.4–1.2)
Calcium: 9.6 mg/dL (ref 8.4–10.5)
Chloride: 97 mEq/L (ref 96–112)
GFR: 39.59 mL/min — AB (ref >60.00)
GLUCOSE: 123 mg/dL — AB (ref 70–99)
Potassium: 4.3 mEq/L (ref 3.5–5.1)
Sodium: 132 mEq/L — ABNORMAL LOW (ref 135–145)

## 2014-06-14 LAB — LDL CHOLESTEROL, DIRECT: LDL DIRECT: 78 mg/dL

## 2014-06-14 LAB — HEMOGLOBIN A1C: Hgb A1c MFr Bld: 6.5 % (ref 4.6–6.5)

## 2014-06-14 LAB — LIPID PANEL
Cholesterol: 155 mg/dL (ref 0–200)
HDL: 40.5 mg/dL (ref >39.00)
NONHDL: 114.5
TRIGLYCERIDES: 279 mg/dL — AB (ref 0.0–149.0)
Total CHOL/HDL Ratio: 4
VLDL: 55.8 mg/dL — AB (ref 0.0–40.0)

## 2014-06-14 MED ORDER — ATORVASTATIN CALCIUM 10 MG PO TABS: 10.0000 mg | ORAL_TABLET | Freq: Every day | ORAL | Status: DC

## 2014-06-14 MED ORDER — GLIMEPIRIDE 4 MG PO TABS: 4.0000 mg | ORAL_TABLET | ORAL | Status: DC

## 2014-06-14 MED ORDER — LEVOTHYROXINE SODIUM 100 MCG PO TABS: 100.0000 ug | ORAL_TABLET | Freq: Every day | ORAL | Status: DC

## 2014-06-14 MED ORDER — SITAGLIPTIN PHOSPHATE 50 MG PO TABS: 50.0000 mg | ORAL_TABLET | ORAL | Status: DC

## 2014-06-14 MED ORDER — ACEBUTOLOL HCL 400 MG PO CAPS: 400.0000 mg | ORAL_CAPSULE | Freq: Every day | ORAL | Status: DC

## 2014-06-14 MED ORDER — ALLOPURINOL 100 MG PO TABS: 100.0000 mg | ORAL_TABLET | Freq: Every day | ORAL | Status: DC

## 2014-06-14 MED ORDER — AMLODIPINE BESYLATE 5 MG PO TABS: 5.0000 mg | ORAL_TABLET | ORAL | Status: DC

## 2014-06-14 MED ORDER — TELMISARTAN-HCTZ 80-12.5 MG PO TABS: 1.0000 | ORAL_TABLET | ORAL | Status: DC

## 2014-06-14 MED ORDER — GLUCOSE BLOOD VI STRP: ORAL_STRIP | Status: DC

## 2014-06-14 NOTE — Assessment & Plan Note (Addendum)
Chronic problem.  Tolerating meds w/o difficulty.  On ARB for renal protection.  Asymptomatic.  Due for eye exam- encouraged her to schedule.  Check labs.  Adjust meds prn

## 2014-06-14 NOTE — Progress Notes (Signed)
Pre visit review using our clinic review tool, if applicable. No additional management support is needed unless otherwise documented below in the visit note. 

## 2014-06-14 NOTE — Progress Notes (Signed)
   Subjective:    Patient ID: [name], female    DOB: 1919/06/24, 80 y.o.   MRN: [id number]  HPI HTN- chronic problem, on Micardis HCT, amlodipine, acebutolol.  Adequate control.  No CP, SOB, HAs, visual changes, edema.  DM- chronic problem, on Januvia, Amaryl.  Checking CBGs regularly, this AM 118, yesterday 112.  Due for eye exam- plans to schedule at Dr Digby's office (daughter goes there).  Some pain in bottom of feet but no numbness.  Hyperlipidemia- chronic problem, on Atorvastatin.  No abd pain, N/V, myalgias.   Review of Systems For ROS see HPI     Objective:   Physical Exam  Vitals reviewed. Constitutional: She is oriented to person, place, and time. She appears well-developed and well-nourished. No distress.  HENT:  Head: Normocephalic and atraumatic.  Eyes: Conjunctivae and EOM are normal. Pupils are equal, round, and reactive to light.  Neck: Normal range of motion. Neck supple. No thyromegaly present.  Cardiovascular: Normal rate, regular rhythm, normal heart sounds and intact distal pulses.   No murmur heard. Pulmonary/Chest: Effort normal and breath sounds normal. No respiratory distress.  Abdominal: Soft. She exhibits no distension. There is no tenderness.  Musculoskeletal: She exhibits no edema.  Lymphadenopathy:    She has no cervical adenopathy.  Neurological: She is alert and oriented to person, place, and time.  Skin: Skin is warm and dry.  Psychiatric: She has a normal mood and affect. Her behavior is normal.          Assessment & Plan:

## 2014-06-14 NOTE — Assessment & Plan Note (Signed)
Chronic problem.  Adequate control.  Asymptomatic.  Check labs.  No anticipated med changes 

## 2014-06-14 NOTE — Patient Instructions (Signed)
Schedule your complete physical in 6 months We'll notify you of your lab results and make any changes if needed All meds have been sent to [facility] Call with any questions or concerns Enjoy the rest of your summer!

## 2014-06-14 NOTE — Assessment & Plan Note (Signed)
Chronic problem.  Tolerating statin w/o difficulty.  Check labs.  Adjust meds prn  

## 2014-06-15 ENCOUNTER — Encounter: Payer: Self-pay | Admitting: General Practice

## 2014-08-12 LAB — HM DIABETES EYE EXAM

## 2014-08-24 DIAGNOSIS — Z23 Encounter for immunization: Secondary | ICD-10-CM | POA: Diagnosis not present

## 2014-09-01 DIAGNOSIS — C50919 Malignant neoplasm of unspecified site of unspecified female breast: Secondary | ICD-10-CM | POA: Diagnosis not present

## 2014-10-05 DIAGNOSIS — Z853 Personal history of malignant neoplasm of breast: Secondary | ICD-10-CM | POA: Diagnosis not present

## 2014-10-05 DIAGNOSIS — N6489 Other specified disorders of breast: Secondary | ICD-10-CM | POA: Diagnosis not present

## 2014-10-05 DIAGNOSIS — N63 Unspecified lump in breast: Secondary | ICD-10-CM | POA: Diagnosis not present

## 2014-10-05 LAB — HM MAMMOGRAPHY

## 2014-10-10 ENCOUNTER — Telehealth: Payer: Self-pay | Admitting: Family Medicine

## 2014-10-10 ENCOUNTER — Other Ambulatory Visit: Payer: Self-pay | Admitting: Radiology

## 2014-10-10 DIAGNOSIS — D242 Benign neoplasm of left breast: Secondary | ICD-10-CM | POA: Diagnosis not present

## 2014-10-10 DIAGNOSIS — Z853 Personal history of malignant neoplasm of breast: Secondary | ICD-10-CM | POA: Diagnosis not present

## 2014-10-10 MED ORDER — ACEBUTOLOL HCL 400 MG PO CAPS: 400.0000 mg | ORAL_CAPSULE | Freq: Every day | ORAL | Status: DC

## 2014-10-10 NOTE — Telephone Encounter (Signed)
Medication had been filled for 1 year in July. Pt advised to contact pharmacy. Refilled again today due to daughter stating that no refills were available.

## 2014-10-10 NOTE — Telephone Encounter (Signed)
Caller name:  susan Relation to pt: daughter Call back number: [phone] Pharmacy: [facility]   Reason for call:   Patient daughter needs refill of ace buterol

## 2014-10-11 ENCOUNTER — Encounter: Payer: Self-pay | Admitting: General Practice

## 2014-10-31 ENCOUNTER — Encounter: Payer: Self-pay | Admitting: Oncology

## 2014-11-14 ENCOUNTER — Telehealth: Payer: Self-pay | Admitting: Oncology

## 2014-11-14 NOTE — Telephone Encounter (Signed)
S/w pt's daughter advised appt chg from 1/25 to 2/11 @ 10.15am with HF. Pt's daughter verbalized understanding.

## 2014-12-12 ENCOUNTER — Other Ambulatory Visit: Payer: Medicare Other

## 2014-12-12 ENCOUNTER — Ambulatory Visit: Payer: Medicare Other | Admitting: Oncology

## 2014-12-19 ENCOUNTER — Encounter: Payer: Self-pay | Admitting: *Deleted

## 2014-12-19 ENCOUNTER — Telehealth: Payer: Self-pay | Admitting: *Deleted

## 2014-12-19 NOTE — Telephone Encounter (Signed)
Pre-Visit Call:   Reviewed allergies, medications, health history, and health maintenance with patient's daughter (will be with her at appointment). 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  She would like to know if it might be residual congestion from the cold.   She also states that she has pain at the bottoms of her feet during ambulation.  She has a walker available if it get bad enough, and uses insoles in her shoes.

## 2014-12-20 ENCOUNTER — Encounter: Payer: Self-pay | Admitting: Family Medicine

## 2014-12-20 ENCOUNTER — Ambulatory Visit (INDEPENDENT_AMBULATORY_CARE_PROVIDER_SITE_OTHER): Payer: Medicare Other | Admitting: Family Medicine

## 2014-12-20 VITALS — BP 136/64 | HR 65 | Temp 97.9°F | Resp 16 | Ht 66.0 in | Wt 175.4 lb

## 2014-12-20 DIAGNOSIS — C50911 Malignant neoplasm of unspecified site of right female breast: Secondary | ICD-10-CM

## 2014-12-20 DIAGNOSIS — E119 Type 2 diabetes mellitus without complications: Secondary | ICD-10-CM

## 2014-12-20 DIAGNOSIS — Z0000 Encounter for general adult medical examination without abnormal findings: Secondary | ICD-10-CM | POA: Diagnosis not present

## 2014-12-20 DIAGNOSIS — I10 Essential (primary) hypertension: Secondary | ICD-10-CM

## 2014-12-20 DIAGNOSIS — Z23 Encounter for immunization: Secondary | ICD-10-CM | POA: Diagnosis not present

## 2014-12-20 DIAGNOSIS — E038 Other specified hypothyroidism: Secondary | ICD-10-CM | POA: Diagnosis not present

## 2014-12-20 DIAGNOSIS — E1169 Type 2 diabetes mellitus with other specified complication: Secondary | ICD-10-CM | POA: Diagnosis not present

## 2014-12-20 DIAGNOSIS — E785 Hyperlipidemia, unspecified: Secondary | ICD-10-CM | POA: Diagnosis not present

## 2014-12-20 LAB — CBC WITH DIFFERENTIAL/PLATELET
Basophils Absolute: 0 10*3/uL (ref 0.0–0.1)
Basophils Relative: 0.5 % (ref 0.0–3.0)
EOS ABS: 0.2 10*3/uL (ref 0.0–0.7)
Eosinophils Relative: 2 % (ref 0.0–5.0)
HEMATOCRIT: 38.9 % (ref 36.0–46.0)
Hemoglobin: 13.3 g/dL (ref 12.0–15.0)
LYMPHS ABS: 1.8 10*3/uL (ref 0.7–4.0)
Lymphocytes Relative: 20.7 % (ref 12.0–46.0)
MCHC: 34.2 g/dL (ref 30.0–36.0)
MCV: 95.8 fl (ref 78.0–100.0)
Monocytes Absolute: 0.5 10*3/uL (ref 0.1–1.0)
Monocytes Relative: 5.7 % (ref 3.0–12.0)
NEUTROS ABS: 6.3 10*3/uL (ref 1.4–7.7)
NEUTROS PCT: 71.1 % (ref 43.0–77.0)
Platelets: 215 10*3/uL (ref 150.0–400.0)
RBC: 4.06 Mil/uL (ref 3.87–5.11)
RDW: 12.8 % (ref 11.5–15.5)
WBC: 8.8 10*3/uL (ref 4.0–10.5)

## 2014-12-20 LAB — TSH: TSH: 0.4 u[IU]/mL (ref 0.35–4.50)

## 2014-12-20 LAB — LIPID PANEL
Cholesterol: 167 mg/dL (ref 0–200)
HDL: 45.2 mg/dL (ref >39.00)
NonHDL: 121.8
Total CHOL/HDL Ratio: 4
Triglycerides: 233 mg/dL — ABNORMAL HIGH (ref 0.0–149.0)
VLDL: 46.6 mg/dL — ABNORMAL HIGH (ref 0.0–40.0)

## 2014-12-20 LAB — BASIC METABOLIC PANEL
BUN: 20 mg/dL (ref 6–23)
CO2: 29 mEq/L (ref 19–32)
CREATININE: 1.2 mg/dL (ref 0.40–1.20)
Calcium: 9.9 mg/dL (ref 8.4–10.5)
Chloride: 100 mEq/L (ref 96–112)
GFR: 44.53 mL/min — AB (ref >60.00)
Glucose, Bld: 133 mg/dL — ABNORMAL HIGH (ref 70–99)
Potassium: 4 mEq/L (ref 3.5–5.1)
Sodium: 135 mEq/L (ref 135–145)

## 2014-12-20 LAB — HEPATIC FUNCTION PANEL
ALK PHOS: 59 U/L (ref 39–117)
ALT: 15 U/L (ref 0–35)
AST: 16 U/L (ref 0–37)
Albumin: 4.2 g/dL (ref 3.5–5.2)
BILIRUBIN DIRECT: 0.1 mg/dL (ref 0.0–0.3)
Total Bilirubin: 0.8 mg/dL (ref 0.2–1.2)
Total Protein: 7.1 g/dL (ref 6.0–8.3)

## 2014-12-20 LAB — LDL CHOLESTEROL, DIRECT: Direct LDL: 85 mg/dL

## 2014-12-20 LAB — HEMOGLOBIN A1C: Hgb A1c MFr Bld: 6.6 % — ABNORMAL HIGH (ref 4.6–6.5)

## 2014-12-20 NOTE — Progress Notes (Signed)
   Subjective:    Patient ID: [name], female    DOB: 07-Sep-1919, 81 y.o.   MRN: [id number]  HPI Here today for CPE.  Risk Factors: HTN- chronic problem, on Telmisartan HCTZ, Amlodipine, Acebutolol.  Well controlled.  Good med compliance.  No CP, SOB, HAs, visual changes, edema. Hyperlipidemia- chronic problem, on Lipitor.  Good med compliance.  Denies abd pain, N/V, myalgias. DM- chronic problem, on Januvia, Amaryl, ARB for renal protection.  Due for eye exam but pt refuses.  CBG this AM 116, yesterday 130.  Denies numbness/tingling Hypothyroid- chronic problem, on Levothyroxine.  Currently asymptomatic. Physical Activity: walking regularly Fall Risk: low Depression: denies current sxs Hearing: normal to conversational tones w/ hearing aides, decreased to whispered voice ADL's: independent Cognitive: normal linear thought process, memory and attention intact Home Safety: safe at home, lives w/ daughter Height, Weight, BMI, Visual Acuity: see vitals, vision corrected to 20/20 w/ glasses Counseling: UTD on mammo, due for repeat in 6 months.  No need for colonoscopy, pt declines DEXA. Labs Ordered: See A&P Care Plan: See A&P    Review of Systems Patient reports no vision/ hearing changes, adenopathy,fever, weight change,  persistant/recurrent hoarseness , swallowing issues, chest pain, palpitations, edema, persistant/recurrent cough, hemoptysis, dyspnea (rest/exertional/paroxysmal nocturnal), gastrointestinal bleeding (melena, rectal bleeding), abdominal pain, significant heartburn, bowel changes, GU symptoms (dysuria, hematuria, incontinence), Gyn symptoms (abnormal  bleeding, pain),  syncope, focal weakness, memory loss, numbness & tingling, skin/hair/nail changes, abnormal bruising or bleeding, anxiety, or depression.   Reviewed meds, allergies, problem list, and PMH in chart     Objective:   Physical Exam General Appearance:    Alert, cooperative, no distress, appears stated  age  Head:    Normocephalic, without obvious abnormality, atraumatic  Eyes:    PERRL, conjunctiva/corneas clear, EOM's intact, fundi    benign, both eyes  Ears:    Hearing aides in place bilaterally  Nose:   Nares normal, septum midline, mucosa normal, no drainage    or sinus tenderness  Throat:   Lips, mucosa, and tongue normal; teeth and gums normal  Neck:   Supple, symmetrical, trachea midline, no adenopathy;    Thyroid: no enlargement/tenderness/nodules  Back:     Symmetric, no curvature, ROM normal, no CVA tenderness  Lungs:     Clear to auscultation bilaterally, respirations unlabored  Chest Wall:    No tenderness or deformity   Heart:    Regular rate and rhythm, S1 and S2 normal, no murmur, rub   or gallop  Breast Exam:    Deferred to mammo  Abdomen:     Soft, non-tender, bowel sounds active all four quadrants,    no masses, no organomegaly  Genitalia:    Deferred  Rectal:    Extremities:   Extremities normal, atraumatic, no cyanosis or edema  Pulses:   2+ and symmetric all extremities  Skin:   Skin color, texture, turgor normal, no rashes or lesions  Lymph nodes:   Cervical, supraclavicular, and axillary nodes normal  Neurologic:   CNII-XII intact, normal strength, sensation and reflexes    throughout          Assessment & Plan:

## 2014-12-20 NOTE — Patient Instructions (Signed)
Follow up in 6 months to recheck BP, cholesterol, diabetes and thyroid We'll notify you of your lab results and make any changes if needed Keep up the good work!  You look great! Call with any questions or concerns Happy New Year!!!

## 2014-12-20 NOTE — Assessment & Plan Note (Signed)
Pt due for repeat Mammo in 6 months.  Would like to have this done at [facility] rather than [place].  Order entered.

## 2014-12-20 NOTE — Progress Notes (Signed)
Pre visit review using our clinic review tool, if applicable. No additional management support is needed unless otherwise documented below in the visit note. 

## 2014-12-20 NOTE — Assessment & Plan Note (Signed)
Chronic problem.  Well controlled.  Asymptomatic.  Check labs.  No anticipated med changes. 

## 2014-12-20 NOTE — Assessment & Plan Note (Signed)
Chronic problem.  Asymptomatic.  Check labs.  Adjust meds prn  

## 2014-12-20 NOTE — Assessment & Plan Note (Signed)
Chronic problem.  Tolerating statin w/o difficulty.  Check labs.  Adjust meds prn  

## 2014-12-20 NOTE — Assessment & Plan Note (Signed)
Pt's PE WNL for age.  She is doing remarkably well.  UTD on mammo.  No need for pap or colonoscopy due to age.  Pt not interested in DEXA or eye exam.  Will give prevnar today.  Written screening schedule updated and given to pt.  Check labs.  Anticipatory guidance provided.

## 2014-12-20 NOTE — Assessment & Plan Note (Signed)
Chronic problem.  Tolerating meds w/o difficulty.  No symptomatic lows.  On ARB for renal protection.  Due for eye exam but pt refusing, 'i'm too old'.  Check labs.  Adjust meds prn.

## 2014-12-21 ENCOUNTER — Encounter: Payer: Self-pay | Admitting: General Practice

## 2014-12-22 ENCOUNTER — Telehealth: Payer: Self-pay | Admitting: Family Medicine

## 2014-12-22 DIAGNOSIS — E1140 Type 2 diabetes mellitus with diabetic neuropathy, unspecified: Secondary | ICD-10-CM

## 2014-12-22 MED ORDER — ALLOPURINOL 100 MG PO TABS: 100.0000 mg | ORAL_TABLET | Freq: Every day | ORAL | Status: DC

## 2014-12-22 MED ORDER — ATORVASTATIN CALCIUM 10 MG PO TABS: 10.0000 mg | ORAL_TABLET | Freq: Every day | ORAL | Status: DC

## 2014-12-22 MED ORDER — AMLODIPINE BESYLATE 5 MG PO TABS: 5.0000 mg | ORAL_TABLET | ORAL | Status: DC

## 2014-12-22 MED ORDER — GLIMEPIRIDE 4 MG PO TABS: 4.0000 mg | ORAL_TABLET | ORAL | Status: DC

## 2014-12-22 MED ORDER — ACEBUTOLOL HCL 400 MG PO CAPS: 400.0000 mg | ORAL_CAPSULE | Freq: Every day | ORAL | Status: DC

## 2014-12-22 MED ORDER — SITAGLIPTIN PHOSPHATE 50 MG PO TABS: 50.0000 mg | ORAL_TABLET | ORAL | Status: DC

## 2014-12-22 MED ORDER — LEVOTHYROXINE SODIUM 100 MCG PO TABS: 100.0000 ug | ORAL_TABLET | Freq: Every day | ORAL | Status: DC

## 2014-12-22 MED ORDER — GLUCOSE BLOOD VI STRP: ORAL_STRIP | Status: DC

## 2014-12-22 MED ORDER — TELMISARTAN-HCTZ 80-12.5 MG PO TABS: 1.0000 | ORAL_TABLET | ORAL | Status: DC

## 2014-12-22 NOTE — Telephone Encounter (Signed)
Caller name: [name] Relation to pt: daughter Call back number: [phone] Pharmacy: [facility]?  Reason for call:   Patient daughter requesting all medications to be refilled. Requesting year refill.

## 2014-12-22 NOTE — Telephone Encounter (Signed)
meds filled to [facility] for 1 year.

## 2014-12-28 ENCOUNTER — Other Ambulatory Visit: Payer: Self-pay | Admitting: *Deleted

## 2014-12-28 DIAGNOSIS — C50911 Malignant neoplasm of unspecified site of right female breast: Secondary | ICD-10-CM

## 2014-12-29 ENCOUNTER — Encounter: Payer: Self-pay | Admitting: Nurse Practitioner

## 2014-12-29 ENCOUNTER — Other Ambulatory Visit (HOSPITAL_BASED_OUTPATIENT_CLINIC_OR_DEPARTMENT_OTHER): Payer: Medicare Other

## 2014-12-29 ENCOUNTER — Ambulatory Visit (HOSPITAL_BASED_OUTPATIENT_CLINIC_OR_DEPARTMENT_OTHER): Payer: Medicare Other | Admitting: Nurse Practitioner

## 2014-12-29 ENCOUNTER — Telehealth: Payer: Self-pay | Admitting: Nurse Practitioner

## 2014-12-29 VITALS — BP 142/91 | HR 66 | Temp 97.6°F | Resp 18 | Ht 66.0 in | Wt 179.3 lb

## 2014-12-29 DIAGNOSIS — Z853 Personal history of malignant neoplasm of breast: Secondary | ICD-10-CM | POA: Diagnosis not present

## 2014-12-29 DIAGNOSIS — C50911 Malignant neoplasm of unspecified site of right female breast: Secondary | ICD-10-CM

## 2014-12-29 LAB — COMPREHENSIVE METABOLIC PANEL (CC13)
ALT: 13 U/L (ref 0–55)
AST: 17 U/L (ref 5–34)
Albumin: 3.9 g/dL (ref 3.5–5.0)
Alkaline Phosphatase: 70 U/L (ref 40–150)
Anion Gap: 12 mEq/L — ABNORMAL HIGH (ref 3–11)
BUN: 22.7 mg/dL (ref 7.0–26.0)
CALCIUM: 10.2 mg/dL (ref 8.4–10.4)
CO2: 25 meq/L (ref 22–29)
Chloride: 99 mEq/L (ref 98–109)
Creatinine: 1.4 mg/dL — ABNORMAL HIGH (ref 0.6–1.1)
EGFR: 34 mL/min/{1.73_m2} — ABNORMAL LOW (ref >90)
Glucose: 211 mg/dl — ABNORMAL HIGH (ref 70–140)
POTASSIUM: 4.5 meq/L (ref 3.5–5.1)
SODIUM: 137 meq/L (ref 136–145)
TOTAL PROTEIN: 7.1 g/dL (ref 6.4–8.3)
Total Bilirubin: 0.7 mg/dL (ref 0.20–1.20)

## 2014-12-29 LAB — CBC WITH DIFFERENTIAL/PLATELET
BASO%: 0.6 % (ref 0.0–2.0)
Basophils Absolute: 0.1 10*3/uL (ref 0.0–0.1)
EOS%: 1.3 % (ref 0.0–7.0)
Eosinophils Absolute: 0.1 10*3/uL (ref 0.0–0.5)
HEMATOCRIT: 40 % (ref 34.8–46.6)
HGB: 13.2 g/dL (ref 11.6–15.9)
LYMPH%: 19.9 % (ref 14.0–49.7)
MCH: 32 pg (ref 25.1–34.0)
MCHC: 32.9 g/dL (ref 31.5–36.0)
MCV: 97.1 fL (ref 79.5–101.0)
MONO#: 0.5 10*3/uL (ref 0.1–0.9)
MONO%: 6.5 % (ref 0.0–14.0)
NEUT#: 5.9 10*3/uL (ref 1.5–6.5)
NEUT%: 71.7 % (ref 38.4–76.8)
PLATELETS: 242 10*3/uL (ref 145–400)
RBC: 4.12 10*6/uL (ref 3.70–5.45)
RDW: 13 % (ref 11.2–14.5)
WBC: 8.2 10*3/uL (ref 3.9–10.3)
lymph#: 1.6 10*3/uL (ref 0.9–3.3)

## 2014-12-29 NOTE — Telephone Encounter (Signed)
per pof to sch pt appt-cld & left message & mailed copy of sch

## 2014-12-29 NOTE — Addendum Note (Signed)
Addended by: Marcelino Duster on: 12/29/2014 05:01 PM   Modules accepted: Orders

## 2014-12-29 NOTE — Progress Notes (Signed)
ID: [name]   DOB: 02/20/1919  MR#: [id number]  RTM#:[id number]  PCP: Annye Asa, MD GYN: SU: Coralie Keens OTHER MD: Darlin Coco, Ron Matilde Haymaker, Nena Polio  CHIEF COMPLAINT: right breast cancer CURRENT TREATMENT: Observation  BREAST CANCER HISTORY: [name] had routine screening mammography 09/26/2011 at [facility] showing a possible abnormality in the right mid breast. She was recalled for additional views November 12, and underwent bilateral diagnostic mammography and right ultrasonography. This showed an irregular ill-defined low-density mass in the right breast without associated microcalcifications. On ultrasound this was heterogeneous, predominantly hypoechoic, with ill-defined borders, and measured 1.9 cm. Biopsy of the mass was performed the same day, and showed ([id number]) an invasive ductal breast cancer, e-cadherin positive, grade 2, estrogen receptor 83% and progesterone receptor 94% positive, with an MIB-1-1 of 90%, but no HER-2 amplification by C[facility].   With this information the patient was set up for bilateral breast MRI, performed November 19. This showed a 3 cm area of irregular confluent non-masslike enhancement in the outer central right breast. There were no other suspicious areas in either breast and no evidence of adenopathy in the axillae or internal mammary areas. The patient's case was discussed at the multidisciplinary breast cancer conference and a preliminary plan was suggested, namely starting with surgery, considering anti-estrogens, and likely foregoing radiation. The patient then proceeded to definitive right lumpectomy 11/06/2011 with results as discussed below  INTERVAL HISTORY: [name] returns today accompanied by her daughter, [name], for follow up of her right breast carcinoma. [name] continues to live alone, but [name] lives across the street and checks on her mother regularly. She just visited her PCP last week and no changes have been made to any of  her medications. Her blood pressure, cholesterol, and diabetes are under control. In November a small oval mass appeared on her mammogram and ultrasound. The biopsy performed was negative for malignancy. She is to follow up with a repeat mammogram in 6 months. The interval history is otherwise unremarkable.   REVIEW OF SYSTEMS: [name] denies fevers, chills, nausea, vomiting, or changes in bowel or bladder habits. She has some pain to bilateral feet when she wears shoes, but otherwise has none there or anywhere else. She denies shortness of breath, chest pain, cough, palpitations, or fatigue. She is eating, drinking, and sleeping well. She denies unexplained weight loss, night sweats, bruising, bleeding, headaches, or dizziness. A detailed review of systems is otherwise negative.   PAST MEDICAL HISTORY: Past Medical History  Diagnosis Date  . Diabetes mellitus, type 2   . Gout   . Hyperlipidemia   . Hyperthyroidism   . Cancer     right breast  . Anxiety     loss of child 1 year ago  . Hypertension     states eccho Dr Mare Ferrari 1 year ago- no record found in EPIC or office records    PAST SURGICAL HISTORY: Past Surgical History  Procedure Laterality Date  . Rotator cuff repair  1999    right  . Abdominal hysterectomy  1995  . Cholecystectomy  1976  . Breast lumpectomy  11/06/2011    Procedure: LUMPECTOMY;  Surgeon: Harl Bowie, MD;  Location: [facility];  Service: General;  Laterality: Right;    FAMILY HISTORY Family History  Problem Relation Age of Onset  . Diabetes Neg Hx   . Hypertension Neg Hx   . Cancer Mother     bladder  . Stroke Brother   The patient's father died from a stroke  at the age of 53 the patient's mother died at age 59 possibly from complications of bladder cancer. The patient had 2 brothers and 2 sisters. There is no evidence of breast or ovarian cancer in the family.   GYNECOLOGIC HISTORY: Menarche age 51, menopause age 89. She never took hormone  replacement. She is GX P2, first live birth age 67  SOCIAL HISTORY:  (Updated February 2015) The patient is a widow and lives by herself. Her daughter [name] lives across the street with her own family. 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 Brickey only worked outside of the home when there was a [employer] here in [place] during [place]). The patient has 3 grandchildren and 3 great-grandchildren; she attends [employer].   ADVANCED DIRECTIVES:  HEALTH MAINTENANCE: (Updated February 2015) History  Substance Use Topics  . Smoking status: Never Smoker   . Smokeless tobacco: Never Used  . Alcohol Use: No     Colonoscopy: Never  PAP: S/P Hysterectomy  Bone density: Never  Lipid panel: January 2015, Dr. Birdie Riddle   No Known Allergies  Current Outpatient Prescriptions  Medication Sig Dispense Refill  . acebutolol (SECTRAL) 400 MG capsule Take 1 capsule (400 mg total) by mouth at bedtime. 90 capsule 3  . allopurinol (ZYLOPRIM) 100 MG tablet Take 1 tablet (100 mg total) by mouth daily. 90 tablet 3  . amLODipine (NORVASC) 5 MG tablet Take 1 tablet (5 mg total) by mouth every morning. 90 tablet 3  . atorvastatin (LIPITOR) 10 MG tablet Take 1 tablet (10 mg total) by mouth at bedtime. 90 tablet 3  . glimepiride (AMARYL) 4 MG tablet Take 1 tablet (4 mg total) by mouth every morning. 90 tablet 3  . glucose blood test strip Use as instructed to test glucose levels once daily. Dx. E11.9 100 each 12  . levothyroxine (SYNTHROID, LEVOTHROID) 100 MCG tablet Take 1 tablet (100 mcg total) by mouth daily before breakfast. 90 tablet 3  . sitaGLIPtin (JANUVIA) 50 MG tablet Take 1 tablet (50 mg total) by mouth every morning. 90 tablet 3  . telmisartan-hydrochlorothiazide (MICARDIS HCT) 80-12.5 MG per tablet Take 1 tablet by mouth every morning. 90 tablet 1   No current facility-administered medications for this visit.    OBJECTIVE: Elderly white woman who appears  comfortable and as in no acute distress Filed Vitals:   12/29/14 1037  BP: 142/91  Pulse: 66  Temp: 97.6 F (36.4 C)  Resp: 18     Body mass index is 28.95 kg/(m^2).    ECOG FS: 1 Filed Weights   12/29/14 1037  Weight: 179 lb 4.8 oz (81.33 kg)   Physical Exam:  Skin: warm, dry  HEENT: sclerae anicteric, conjunctivae pink, oropharynx clear. No thrush or mucositis.  Lymph Nodes: No cervical or supraclavicular lymphadenopathy  Lungs: clear to auscultation bilaterally, no rales, wheezes, or rhonci  Heart: regular rate and rhythm  Abdomen: round, soft, non tender, positive bowel sounds  Musculoskeletal: No focal spinal tenderness, no peripheral edema  Neuro: non focal, well oriented, positive affect  Breasts: right breast status post lumpectomy. No evidence of recurrent disease. Right axilla benign. Left breast unremarkable.  LAB RESULTS: Lab Results  Component Value Date   WBC 8.2 12/29/2014   NEUTROABS 5.9 12/29/2014   HGB 13.2 12/29/2014   HCT 40.0 12/29/2014   MCV 97.1 12/29/2014   PLT 242 12/29/2014      Chemistry      Component Value  Date/Time   NA 137 12/29/2014 1009   NA 135 12/20/2014 1051   K 4.5 12/29/2014 1009   K 4.0 12/20/2014 1051   CL 100 12/20/2014 1051   CL 104 12/10/2012 1145   CO2 25 12/29/2014 1009   CO2 29 12/20/2014 1051   BUN 22.7 12/29/2014 1009   BUN 20 12/20/2014 1051   CREATININE 1.4* 12/29/2014 1009   CREATININE 1.20 12/20/2014 1051      Component Value Date/Time   CALCIUM 10.2 12/29/2014 1009   CALCIUM 9.9 12/20/2014 1051   ALKPHOS 70 12/29/2014 1009   ALKPHOS 59 12/20/2014 1051   AST 17 12/29/2014 1009   AST 16 12/20/2014 1051   ALT 13 12/29/2014 1009   ALT 15 12/20/2014 1051   BILITOT 0.70 12/29/2014 1009   BILITOT 0.8 12/20/2014 1051       Lab Results  Component Value Date   LABCA2 37 12/10/2012    STUDIES:  Most recent bilateral mammogram at [facility] on 10/05/2014 showed 46m oval mass. Biopsy was negative for  malignancy. Mammogram to be repeated in 6 months.  ASSESSMENT: 81y.o.  [place] woman   (1)  status post right lumpectomy 11/06/2011 for a T2 NX, Stage II invasive ductal carcinoma, grade 2, strongly estrogen and progesterone receptor positive, with a very elevated MIB-1, but no HER-2 amplification.   (2) declined antiestrogen therapy, followed with observation alone.  PLAN: EMargaretha Sheffieldlooks and feels well today. The labs were reviewed in detail and were stable. She is doing well for her breast cancer, now 3 years out from her definitive surgery. [facility]   EMargaretha Sheffieldwill return for labs and a follow up visit in 1 year. She understands and agrees with this plan. She has been encouraged to call with any issues that might arise before her next visit here.  FMarcelino Duster NP  12/29/2014

## 2015-04-14 DIAGNOSIS — Z853 Personal history of malignant neoplasm of breast: Secondary | ICD-10-CM | POA: Diagnosis not present

## 2015-04-14 DIAGNOSIS — N63 Unspecified lump in breast: Secondary | ICD-10-CM | POA: Diagnosis not present

## 2015-04-14 LAB — HM MAMMOGRAPHY

## 2015-04-19 ENCOUNTER — Encounter: Payer: Self-pay | Admitting: General Practice

## 2015-06-22 ENCOUNTER — Encounter: Payer: Self-pay | Admitting: General Practice

## 2015-06-22 ENCOUNTER — Ambulatory Visit (INDEPENDENT_AMBULATORY_CARE_PROVIDER_SITE_OTHER): Payer: Medicare Other | Admitting: Family Medicine

## 2015-06-22 ENCOUNTER — Encounter: Payer: Self-pay | Admitting: Family Medicine

## 2015-06-22 VITALS — BP 136/84 | HR 90 | Temp 97.9°F | Resp 16 | Wt 176.5 lb

## 2015-06-22 DIAGNOSIS — E038 Other specified hypothyroidism: Secondary | ICD-10-CM | POA: Diagnosis not present

## 2015-06-22 DIAGNOSIS — E1169 Type 2 diabetes mellitus with other specified complication: Secondary | ICD-10-CM | POA: Diagnosis not present

## 2015-06-22 DIAGNOSIS — E785 Hyperlipidemia, unspecified: Secondary | ICD-10-CM

## 2015-06-22 DIAGNOSIS — I10 Essential (primary) hypertension: Secondary | ICD-10-CM

## 2015-06-22 DIAGNOSIS — M1000 Idiopathic gout, unspecified site: Secondary | ICD-10-CM

## 2015-06-22 DIAGNOSIS — M25551 Pain in right hip: Secondary | ICD-10-CM

## 2015-06-22 DIAGNOSIS — M25559 Pain in unspecified hip: Secondary | ICD-10-CM | POA: Insufficient documentation

## 2015-06-22 DIAGNOSIS — E119 Type 2 diabetes mellitus without complications: Secondary | ICD-10-CM

## 2015-06-22 LAB — LIPID PANEL
CHOL/HDL RATIO: 5
CHOLESTEROL: 233 mg/dL — AB (ref 0–200)
HDL: 45.9 mg/dL (ref >39.00)
NONHDL: 187.23
Triglycerides: 297 mg/dL — ABNORMAL HIGH (ref 0.0–149.0)
VLDL: 59.4 mg/dL — AB (ref 0.0–40.0)

## 2015-06-22 LAB — BASIC METABOLIC PANEL
BUN: 25 mg/dL — AB (ref 6–23)
CHLORIDE: 99 meq/L (ref 96–112)
CO2: 28 mEq/L (ref 19–32)
Calcium: 10 mg/dL (ref 8.4–10.5)
Creatinine, Ser: 1.31 mg/dL — ABNORMAL HIGH (ref 0.40–1.20)
GFR: 40.2 mL/min — AB (ref >60.00)
GLUCOSE: 120 mg/dL — AB (ref 70–99)
POTASSIUM: 3.9 meq/L (ref 3.5–5.1)
Sodium: 133 mEq/L — ABNORMAL LOW (ref 135–145)

## 2015-06-22 LAB — CBC WITH DIFFERENTIAL/PLATELET
Basophils Absolute: 0 10*3/uL (ref 0.0–0.1)
Basophils Relative: 0.4 % (ref 0.0–3.0)
Eosinophils Absolute: 0.1 10*3/uL (ref 0.0–0.7)
Eosinophils Relative: 1.4 % (ref 0.0–5.0)
HCT: 38.1 % (ref 36.0–46.0)
Hemoglobin: 13 g/dL (ref 12.0–15.0)
Lymphocytes Relative: 19.8 % (ref 12.0–46.0)
Lymphs Abs: 1.8 10*3/uL (ref 0.7–4.0)
MCHC: 34 g/dL (ref 30.0–36.0)
MCV: 97.2 fl (ref 78.0–100.0)
MONO ABS: 0.6 10*3/uL (ref 0.1–1.0)
Monocytes Relative: 6.4 % (ref 3.0–12.0)
NEUTROS PCT: 72 % (ref 43.0–77.0)
Neutro Abs: 6.7 10*3/uL (ref 1.4–7.7)
Platelets: 292 10*3/uL (ref 150.0–400.0)
RBC: 3.92 Mil/uL (ref 3.87–5.11)
RDW: 12.8 % (ref 11.5–15.5)
WBC: 9.3 10*3/uL (ref 4.0–10.5)

## 2015-06-22 LAB — HEPATIC FUNCTION PANEL
ALT: 14 U/L (ref 0–35)
AST: 16 U/L (ref 0–37)
Albumin: 4.1 g/dL (ref 3.5–5.2)
Alkaline Phosphatase: 66 U/L (ref 39–117)
BILIRUBIN TOTAL: 0.6 mg/dL (ref 0.2–1.2)
Bilirubin, Direct: 0.1 mg/dL (ref 0.0–0.3)
Total Protein: 7.4 g/dL (ref 6.0–8.3)

## 2015-06-22 LAB — HEMOGLOBIN A1C: Hgb A1c MFr Bld: 6.2 % (ref 4.6–6.5)

## 2015-06-22 LAB — TSH: TSH: 0.92 u[IU]/mL (ref 0.35–4.50)

## 2015-06-22 LAB — LDL CHOLESTEROL, DIRECT: Direct LDL: 140 mg/dL

## 2015-06-22 MED ORDER — TELMISARTAN-HCTZ 80-12.5 MG PO TABS: 1.0000 | ORAL_TABLET | ORAL | Status: DC

## 2015-06-22 MED ORDER — DICLOFENAC SODIUM 1 % TD GEL: 4.0000 g | Freq: Four times a day (QID) | TRANSDERMAL | Status: DC

## 2015-06-22 NOTE — Assessment & Plan Note (Signed)
Chronic problem.  Pt has hx of this '30 yrs ago'.  Wants to stop Allopurinol.  Cautioned her that this could precipitate gout.  Will attempt to stop meds.

## 2015-06-22 NOTE — Progress Notes (Signed)
Pre visit review using our clinic review tool, if applicable. No additional management support is needed unless otherwise documented below in the visit note. 

## 2015-06-22 NOTE — Patient Instructions (Signed)
Follow up in 3-4 months to recheck diabetes We'll notify you of your lab results and make any changes if needed STOP the allopurinol Use the Voltaren gel on the achy joints Keep up the good work!  You look great! Call with any questions or concerns Enjoy the rest of your summer!!

## 2015-06-22 NOTE — Assessment & Plan Note (Signed)
Chronic problem.  Adequate control.  Asymptomatic.  Check labs.  No anticipated med changes 

## 2015-06-22 NOTE — Assessment & Plan Note (Signed)
Chronic problem.  Reported CBGs are excellent.  UTD on eye exam, foot exam.  On ARB for renal protection.  Currently asymptomatic.  Check labs.  Adjust meds prn

## 2015-06-22 NOTE — Assessment & Plan Note (Signed)
Chronic problem, tolerating statin w/o difficulty.  Check labs.  Adjust meds prn  

## 2015-06-22 NOTE — Assessment & Plan Note (Signed)
Chronic problem.  Adjust meds prn.

## 2015-06-22 NOTE — Progress Notes (Signed)
   Subjective:    Patient ID: [name], female    DOB: 02/20/1919, 81 y.o.   MRN: [id number]  HPI DM- chronic problem, on Amaryl, Januvia.  UTD on foot exam, on ARB for renal protection, UTD on eye exam (had cataract surgery last year 07/2014).  Denies symptomatic lows.  CBGs ranging 110-130s.  HTN- chronic problem, on Micardis HCT, amlodipine, acebutolol.  Denies CP, SOB, HAs, visual changes, edema.  Hyperlipidemia- chronic problem, on Lipitor daily.  No abd pain, N/V, myalgias.  Hypothyroid- chronic problem, on levothyroxine daily.  Denies fatigue or changes to skin/hair/nails.  Gout- pt wants to stop Allopurinol, 'that was 30 yrs ago'.  Hip pain- pt reports pain in R hip and lower back when walking and improves w/ rest.  'give me some muscle relaxers'.  No weakness/numbness, no bowel or bladder incontinence.   Review of Systems For ROS see HPI     Objective:   Physical Exam  Constitutional: She is oriented to person, place, and time. She appears well-developed and well-nourished. No distress.  HENT:  Head: Normocephalic and atraumatic.  Eyes: Conjunctivae and EOM are normal. Pupils are equal, round, and reactive to light.  Neck: Normal range of motion. Neck supple. No thyromegaly present.  Cardiovascular: Normal rate, regular rhythm, normal heart sounds and intact distal pulses.   No murmur heard. Pulmonary/Chest: Effort normal and breath sounds normal. No respiratory distress.  Abdominal: Soft. She exhibits no distension. There is no tenderness.  Musculoskeletal: She exhibits no edema.  Lymphadenopathy:    She has no cervical adenopathy.  Neurological: She is alert and oriented to person, place, and time.  Skin: Skin is warm and dry.  Psychiatric: She has a normal mood and affect. Her behavior is normal.  Vitals reviewed.         Assessment & Plan:

## 2015-06-22 NOTE — Assessment & Plan Note (Signed)
New.  Suspect arthritis based on pt's report that sxs are worse w/ movement and improvement w/ rest.  Reviewed risk of muscle relaxers and pain meds in the elderly w/ pt and family.  Will start topical NSAIDs to avoid GI side effects.  Reviewed supportive care and red flags that should prompt return.  Pt expressed understanding and is in agreement w/ plan.

## 2015-06-23 ENCOUNTER — Telehealth: Payer: Self-pay | Admitting: Family Medicine

## 2015-06-23 NOTE — Telephone Encounter (Signed)
Spoke with pt daughter who advised that when she told her mom her cholesterol was elevated her mom advised she had not been taking her medications. Pt had complained previously that it caused leg cramps. I advised pt daughter that if cramping started again to call the office we may need to change her medications.

## 2015-06-23 NOTE — Telephone Encounter (Signed)
Please call pt and see what daughter needs

## 2015-06-23 NOTE — Telephone Encounter (Signed)
Caller name: [name]  Relation to pt: daughter  Call back number: [phone]    Reason for call:  Daughter stated patient will start taking Lipitor, patient stopped taking lipitor and that's why her cholesterol level was off. Daughter would like to speak with you

## 2015-06-23 NOTE — Telephone Encounter (Signed)
FYI Dr. Birdie Riddle (follow-up from lab results).

## 2015-08-30 DIAGNOSIS — Z23 Encounter for immunization: Secondary | ICD-10-CM | POA: Diagnosis not present

## 2015-09-18 DIAGNOSIS — C50919 Malignant neoplasm of unspecified site of unspecified female breast: Secondary | ICD-10-CM | POA: Diagnosis not present

## 2015-10-11 DIAGNOSIS — R928 Other abnormal and inconclusive findings on diagnostic imaging of breast: Secondary | ICD-10-CM | POA: Diagnosis not present

## 2015-10-11 LAB — HM MAMMOGRAPHY

## 2015-10-18 ENCOUNTER — Encounter: Payer: Self-pay | Admitting: General Practice

## 2015-10-23 ENCOUNTER — Ambulatory Visit: Payer: Medicare Other | Admitting: Family Medicine

## 2015-10-23 ENCOUNTER — Ambulatory Visit (INDEPENDENT_AMBULATORY_CARE_PROVIDER_SITE_OTHER): Payer: Medicare Other | Admitting: Family

## 2015-10-23 ENCOUNTER — Encounter: Payer: Self-pay | Admitting: Family

## 2015-10-23 VITALS — BP 151/59 | HR 62 | Temp 98.2°F | Resp 18 | Ht 66.0 in | Wt 177.4 lb

## 2015-10-23 DIAGNOSIS — E1122 Type 2 diabetes mellitus with diabetic chronic kidney disease: Secondary | ICD-10-CM | POA: Diagnosis not present

## 2015-10-23 DIAGNOSIS — I10 Essential (primary) hypertension: Secondary | ICD-10-CM | POA: Diagnosis not present

## 2015-10-23 DIAGNOSIS — E119 Type 2 diabetes mellitus without complications: Secondary | ICD-10-CM

## 2015-10-23 DIAGNOSIS — E039 Hypothyroidism, unspecified: Secondary | ICD-10-CM | POA: Diagnosis not present

## 2015-10-23 LAB — HEMOGLOBIN A1C: HEMOGLOBIN A1C: 6.5 % (ref 4.6–6.5)

## 2015-10-23 LAB — BASIC METABOLIC PANEL
BUN: 24 mg/dL — ABNORMAL HIGH (ref 6–23)
CALCIUM: 10 mg/dL (ref 8.4–10.5)
CO2: 28 mEq/L (ref 19–32)
CREATININE: 1.43 mg/dL — AB (ref 0.40–1.20)
Chloride: 100 mEq/L (ref 96–112)
GFR: 36.3 mL/min — AB (ref >60.00)
Glucose, Bld: 141 mg/dL — ABNORMAL HIGH (ref 70–99)
Potassium: 4.1 mEq/L (ref 3.5–5.1)
SODIUM: 137 meq/L (ref 135–145)

## 2015-10-23 LAB — TSH: TSH: 0.5 u[IU]/mL (ref 0.35–4.50)

## 2015-10-23 NOTE — Progress Notes (Signed)
Pre visit review using our clinic review tool, if applicable. No additional management support is needed unless otherwise documented below in the visit note. 

## 2015-10-23 NOTE — Patient Instructions (Signed)
Please complete lab work prior to leaving.   

## 2015-10-23 NOTE — Progress Notes (Signed)
Subjective:    Patient ID: [name], female    DOB: Aug 02, 1919, 81 y.o.   MRN: [id number]  HPI  [name] is a 81 yr old female who presents today to establish care.  1) HTN- currently maintained on amlodipine, and micardis hct.   BP Readings from Last 3 Encounters:  10/23/15 151/59  06/22/15 136/84  12/29/14 142/91   2) DM2-  Continues januvia and amaryl.  Reports sugars never below 100. Lab Results  Component Value Date   HGBA1C 6.2 06/22/2015   HGBA1C 6.6* 12/20/2014   HGBA1C 6.5 06/14/2014   Lab Results  Component Value Date   LDLCALC 92 12/16/2013   CREATININE 1.31* 06/22/2015   3) Hypothyroid- continues synthroid, feels good on her current dose.   Lab Results  Component Value Date   TSH 0.92 06/22/2015    Review of Systems  Respiratory: Negative for shortness of breath.   Cardiovascular: Negative for chest pain and leg swelling.   Past Medical History  Diagnosis Date  . Diabetes mellitus, type 2 ([place])   . Gout   . Hyperlipidemia   . Hyperthyroidism   . Cancer [facility])     right breast  . Anxiety     loss of child 1 year ago  . Hypertension     states eccho Dr Mare Ferrari 1 year ago- no record found in EPIC or office records    Social History   Social History  . Marital Status: Widowed    Spouse Name: N/A  . Number of Children: N/A  . Years of Education: N/A   Occupational History  . Not on file.   Social History Main Topics  . Smoking status: Never Smoker   . Smokeless tobacco: Never Used  . Alcohol Use: No  . Drug Use: No  . Sexual Activity: Not on file   Other Topics Concern  . Not on file   Social History Narrative   Daughter helps with pt considerably- she lives across the street from her daughter   Homemaker   Completed high school   2 children, both daughters, one died at age 71   Enjoys spending time outside, yard work.   No pets       Past Surgical History  Procedure Laterality Date  . Rotator cuff repair  1999   right  . Abdominal hysterectomy  1995  . Cholecystectomy  1976  . Breast lumpectomy  11/06/2011    Procedure: LUMPECTOMY;  Surgeon: Harl Bowie, MD;  Location: [facility];  Service: General;  Laterality: Right;    Family History  Problem Relation Age of Onset  . Diabetes Neg Hx   . Hypertension Neg Hx   . Cancer Mother     bladder  . Stroke Brother     No Known Allergies  Current Outpatient Prescriptions on File Prior to Visit  Medication Sig Dispense Refill  . acebutolol (SECTRAL) 400 MG capsule Take 1 capsule (400 mg total) by mouth at bedtime. 90 capsule 3  . amLODipine (NORVASC) 5 MG tablet Take 1 tablet (5 mg total) by mouth every morning. 90 tablet 3  . atorvastatin (LIPITOR) 10 MG tablet Take 1 tablet (10 mg total) by mouth at bedtime. 90 tablet 3  . diclofenac sodium (VOLTAREN) 1 % GEL Apply 4 g topically 4 (four) times daily. 100 g 6  . glucose blood test strip Use as instructed to test glucose levels once daily. Dx. E11.9 100 each 12  . levothyroxine (SYNTHROID,  LEVOTHROID) 100 MCG tablet Take 1 tablet (100 mcg total) by mouth daily before breakfast. 90 tablet 3  . sitaGLIPtin (JANUVIA) 50 MG tablet Take 1 tablet (50 mg total) by mouth every morning. 90 tablet 3  . telmisartan-hydrochlorothiazide (MICARDIS HCT) 80-12.5 MG per tablet Take 1 tablet by mouth every morning. 90 tablet 1   No current facility-administered medications on file prior to visit.    BP 151/59 mmHg  Pulse 62  Temp(Src) 98.2 F (36.8 C) (Oral)  Resp 18  Ht 5\' 6"  (1.676 m)  Wt 177 lb 6.4 oz (80.468 kg)  BMI 28.65 kg/m2  SpO2 99%       Objective:   Physical Exam  Constitutional: She is oriented to person, place, and time. She appears well-developed and well-nourished.  HENT:  Head: Normocephalic and atraumatic.  Eyes: No scleral icterus.  Cardiovascular: Normal rate, regular rhythm and normal heart sounds.   No murmur heard. Pulmonary/Chest: Effort normal and breath sounds normal. No  respiratory distress. She has no wheezes.  Musculoskeletal: She exhibits no edema.  Neurological: She is alert and oriented to person, place, and time.  Skin: Skin is warm and dry.  Psychiatric: She has a normal mood and affect. Her behavior is normal. Judgment and thought content normal.          Assessment & Plan:

## 2015-10-24 ENCOUNTER — Telehealth: Payer: Self-pay | Admitting: Family

## 2015-10-24 ENCOUNTER — Encounter: Payer: Self-pay | Admitting: Family

## 2015-10-24 MED ORDER — GLIMEPIRIDE 4 MG PO TABS: 2.0000 mg | ORAL_TABLET | ORAL | Status: DC

## 2015-10-24 NOTE — Telephone Encounter (Signed)
Please let pt know diabetes looks very well controlled. I would like her to cut down her amaryl 4mg  to 1/2 tab (2mg ) once daily. I don't want her to go too low.  Kidney function is stable. Thyroid looks ok.

## 2015-10-24 NOTE — Telephone Encounter (Signed)
Notified pt's daughter, [name].

## 2015-10-26 NOTE — Assessment & Plan Note (Addendum)
To avoid hypoglycemia, I advised pt to cut amaryl dose in half.  Given advanced age ok to run A1C a bit higher. Obtain follow up A1C

## 2015-10-26 NOTE — Assessment & Plan Note (Signed)
BP acceptable for her age, continue current meds.

## 2015-10-26 NOTE — Assessment & Plan Note (Signed)
Obtain A1C. 

## 2015-12-19 ENCOUNTER — Telehealth: Payer: Self-pay | Admitting: Family

## 2015-12-19 MED ORDER — ATORVASTATIN CALCIUM 10 MG PO TABS: 10.0000 mg | ORAL_TABLET | Freq: Every day | ORAL | Status: DC

## 2015-12-19 MED ORDER — GLUCOSE BLOOD VI STRP: ORAL_STRIP | Status: DC

## 2015-12-19 MED ORDER — TELMISARTAN-HCTZ 80-12.5 MG PO TABS: 1.0000 | ORAL_TABLET | ORAL | Status: DC

## 2015-12-19 MED ORDER — SITAGLIPTIN PHOSPHATE 50 MG PO TABS: 50.0000 mg | ORAL_TABLET | ORAL | Status: DC

## 2015-12-19 MED ORDER — DICLOFENAC SODIUM 1 % TD GEL: 4.0000 g | Freq: Four times a day (QID) | TRANSDERMAL | Status: DC

## 2015-12-19 MED ORDER — LEVOTHYROXINE SODIUM 100 MCG PO TABS: 100.0000 ug | ORAL_TABLET | Freq: Every day | ORAL | Status: DC

## 2015-12-19 MED ORDER — AMLODIPINE BESYLATE 5 MG PO TABS: 5.0000 mg | ORAL_TABLET | ORAL | Status: DC

## 2015-12-19 MED ORDER — ACEBUTOLOL HCL 400 MG PO CAPS: 400.0000 mg | ORAL_CAPSULE | Freq: Every day | ORAL | Status: DC

## 2015-12-19 NOTE — Telephone Encounter (Signed)
Refills sent to mail order 

## 2015-12-19 NOTE — Telephone Encounter (Signed)
Caller name:Hill,Susan Relation to [id number]  Call back number:[phone] Pharmacy: [facility], [place] [phone] (Phone) [phone] (Fax)         Reason for call:  Daughter requesting a refill of all patient meds daughter did state if any changes in prescription please notify her before.

## 2015-12-19 NOTE — Telephone Encounter (Signed)
Pt saw PCP 10/23/15 and has follow up 01/2016. I have refilled all meds below except Amaryl as it was sent in December.  Also pended Sectral and Voltaren gel as I was unsure about these? Please advise.

## 2015-12-20 NOTE — Telephone Encounter (Signed)
Left message to return my call.  

## 2015-12-25 NOTE — Telephone Encounter (Signed)
Spoke with pt's daughter. She states that pt increased amaryl to 1 tablet daily because blood sugars were "running higher" per pt on the 1/2 tablet daily. She states they will discuss with PCP at f/u on 01/23/16.

## 2015-12-25 NOTE — Telephone Encounter (Signed)
Daughter returning your call best # [phone] home

## 2016-01-04 ENCOUNTER — Other Ambulatory Visit (HOSPITAL_BASED_OUTPATIENT_CLINIC_OR_DEPARTMENT_OTHER): Payer: Medicare Other

## 2016-01-04 ENCOUNTER — Ambulatory Visit (HOSPITAL_BASED_OUTPATIENT_CLINIC_OR_DEPARTMENT_OTHER): Payer: Medicare Other | Admitting: Oncology

## 2016-01-04 VITALS — BP 150/57 | HR 66 | Temp 97.5°F | Resp 18 | Ht 66.0 in | Wt 175.7 lb

## 2016-01-04 DIAGNOSIS — C50811 Malignant neoplasm of overlapping sites of right female breast: Secondary | ICD-10-CM

## 2016-01-04 DIAGNOSIS — C50911 Malignant neoplasm of unspecified site of right female breast: Secondary | ICD-10-CM

## 2016-01-04 DIAGNOSIS — E119 Type 2 diabetes mellitus without complications: Secondary | ICD-10-CM

## 2016-01-04 LAB — CBC WITH DIFFERENTIAL/PLATELET
BASO%: 0.9 % (ref 0.0–2.0)
Basophils Absolute: 0.1 10*3/uL (ref 0.0–0.1)
EOS%: 1.9 % (ref 0.0–7.0)
Eosinophils Absolute: 0.2 10*3/uL (ref 0.0–0.5)
HCT: 39.5 % (ref 34.8–46.6)
HGB: 13.3 g/dL (ref 11.6–15.9)
LYMPH%: 23.3 % (ref 14.0–49.7)
MCH: 32.2 pg (ref 25.1–34.0)
MCHC: 33.8 g/dL (ref 31.5–36.0)
MCV: 95.3 fL (ref 79.5–101.0)
MONO#: 0.7 10*3/uL (ref 0.1–0.9)
MONO%: 8 % (ref 0.0–14.0)
NEUT%: 65.9 % (ref 38.4–76.8)
NEUTROS ABS: 5.8 10*3/uL (ref 1.5–6.5)
PLATELETS: 238 10*3/uL (ref 145–400)
RBC: 4.14 10*6/uL (ref 3.70–5.45)
RDW: 12.2 % (ref 11.2–14.5)
WBC: 8.8 10*3/uL (ref 3.9–10.3)
lymph#: 2 10*3/uL (ref 0.9–3.3)

## 2016-01-04 LAB — COMPREHENSIVE METABOLIC PANEL
ALT: 16 U/L (ref 0–55)
ANION GAP: 9 meq/L (ref 3–11)
AST: 17 U/L (ref 5–34)
Albumin: 3.8 g/dL (ref 3.5–5.0)
Alkaline Phosphatase: 67 U/L (ref 40–150)
BILIRUBIN TOTAL: 0.69 mg/dL (ref 0.20–1.20)
BUN: 30.5 mg/dL — ABNORMAL HIGH (ref 7.0–26.0)
CO2: 28 meq/L (ref 22–29)
CREATININE: 1.5 mg/dL — AB (ref 0.6–1.1)
Calcium: 9.8 mg/dL (ref 8.4–10.4)
Chloride: 102 mEq/L (ref 98–109)
EGFR: 29 mL/min/{1.73_m2} — ABNORMAL LOW (ref >90)
GLUCOSE: 148 mg/dL — AB (ref 70–140)
Potassium: 4 mEq/L (ref 3.5–5.1)
SODIUM: 139 meq/L (ref 136–145)
TOTAL PROTEIN: 7.4 g/dL (ref 6.4–8.3)

## 2016-01-04 NOTE — Progress Notes (Signed)
ID: AMANY RANDO   DOB: 11-04-1919  MR#: [id number]  BHA#:[id number]  PCP: [name]., NP GYN: SUCoralie Henson OTHER MD: [name], [name], [name]  CHIEF COMPLAINT: right breast cancer  CURRENT TREATMENT: Observation  BREAST CANCER HISTORY: From the original intake note:  [name] had routine screening mammography 09/26/2011 at [facility] showing a possible abnormality in the right mid breast. She was recalled for additional views November 12, and underwent bilateral diagnostic mammography and right ultrasonography. This showed an irregular ill-defined low-density mass in the right breast without associated microcalcifications. On ultrasound this was heterogeneous, predominantly hypoechoic, with ill-defined borders, and measured 1.9 cm. Biopsy of the mass was performed the same day, and showed ([id number]) an invasive ductal breast cancer, e-cadherin positive, grade 2, estrogen receptor 83% and progesterone receptor 94% positive, with an MIB-1-1 of 90%, but no HER-2 amplification by C[facility].   With this information the patient was set up for bilateral breast MRI, performed November 19. This showed a 3 cm area of irregular confluent non-masslike enhancement in the outer central right breast. There were no other suspicious areas in either breast and no evidence of adenopathy in the axillae or internal mammary areas. The patient's case was discussed at the multidisciplinary breast cancer conference and a preliminary plan was suggested, namely starting with surgery, considering anti-estrogens, and likely foregoing radiation. The patient then proceeded to definitive right lumpectomy 11/06/2011.  Her subsequent history is as detailed below  INTERVAL HISTORY: [name] returns today for follow-up of her estrogen receptor positive breast cancer accompanied by her daughter, [name]. [name] tells me she is doing "fine", but she is a little bit [place]. She states almost all the  time. "Nothing is going on".   REVIEW OF SYSTEMS: When asked what are worse problem is [name] says it is her feet. They burn at night. They make her walking unsteady. She doesn't want to use a cane but she does hold on to things that she walks. There have been no falls, she tells me. She has significant hearing loss and does not get that much help from her hearing aids. He says her diabetes is well-controlled. A detailed review of systems today was otherwise stable   PAST MEDICAL HISTORY: Past Medical History  Diagnosis Date  . Diabetes mellitus, type 2 ([place])   . Gout   . Hyperlipidemia   . Hyperthyroidism   . Cancer [facility])     right breast  . Anxiety     loss of child 1 year ago  . Hypertension     states eccho Dr Mare Ferrari 1 year ago- no record found in EPIC or office records    PAST SURGICAL HISTORY: Past Surgical History  Procedure Laterality Date  . Rotator cuff repair  1999    right  . Abdominal hysterectomy  1995  . Cholecystectomy  1976  . Breast lumpectomy  11/06/2011    Procedure: LUMPECTOMY;  Surgeon: Harl Bowie, MD;  Location: [facility];  Service: General;  Laterality: Right;    FAMILY HISTORY Family History  Problem Relation Age of Onset  . Diabetes Neg Hx   . Hypertension Neg Hx   . Cancer Mother     bladder  . Stroke Brother   The patient's father died from a stroke at the age of 56 the patient's mother died at age 5 possibly from complications of bladder cancer. The patient had 2 brothers and 2 sisters. There is no evidence of breast or ovarian cancer in  the family.   GYNECOLOGIC HISTORY: Menarche age 17, menopause age 39. She never took hormone replacement. She is GX P2, first live birth age 82  SOCIAL HISTORY:  (Updated February 2015) The patient is a widow and lives by herself. Her daughter [name] lives across the street with her own family. 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 Bultman only worked outside of the home when there was a  [employer] here in [place] during [place]). The patient has 3 grandchildren and 3 great-grandchildren; she attends [employer].   HEALTH MAINTENANCE: (Updated February 2015) Social History  Substance Use Topics  . Smoking status: Never Smoker   . Smokeless tobacco: Never Used  . Alcohol Use: No     Colonoscopy: Never  PAP: S/P Hysterectomy  Bone density: Never  Lipid panel: January 2015, Dr. Birdie Riddle   No Known Allergies  Current Outpatient Prescriptions  Medication Sig Dispense Refill  . acebutolol (SECTRAL) 400 MG capsule Take 1 capsule (400 mg total) by mouth at bedtime. 90 capsule 1  . amLODipine (NORVASC) 5 MG tablet Take 1 tablet (5 mg total) by mouth every morning. 90 tablet 1  . atorvastatin (LIPITOR) 10 MG tablet Take 1 tablet (10 mg total) by mouth at bedtime. 90 tablet 1  . diclofenac sodium (VOLTAREN) 1 % GEL Apply 4 g topically 4 (four) times daily. 100 g 1  . glimepiride (AMARYL) 4 MG tablet Take 0.5 tablets (2 mg total) by mouth every morning. 90 tablet 3  . glucose blood test strip Use as instructed to test glucose levels once daily. Dx. E11.9 100 each 1  . levothyroxine (SYNTHROID, LEVOTHROID) 100 MCG tablet Take 1 tablet (100 mcg total) by mouth daily before breakfast. 90 tablet 1  . sitaGLIPtin (JANUVIA) 50 MG tablet Take 1 tablet (50 mg total) by mouth every morning. 90 tablet 1  . telmisartan-hydrochlorothiazide (MICARDIS HCT) 80-12.5 MG tablet Take 1 tablet by mouth every morning. 90 tablet 1   No current facility-administered medications for this visit.    OBJECTIVE: Older white woman in no acute distress Filed Vitals:   01/04/16 1231  BP: 150/57  Pulse: 66  Temp: 97.5 F (36.4 C)  Resp: 18     Body mass index is 28.37 kg/(m^2).    ECOG FS: 1 Filed Weights   01/04/16 1231  Weight: 175 lb 11.2 oz (79.697 kg)   Sclerae unicteric, EOMs intact Oropharynx clear, slightly dry No cervical or supraclavicular adenopathy Lungs  no rales or rhonchi Heart regular rate and rhythm Abd soft, nontender, positive bowel sounds MSK no focal spinal tenderness, no upper extremity lymphedema Neuro: nonfocal, well oriented, appropriate affect Breasts: The right breast is status post lumpectomy. There is no evidence of local recurrence. The right axilla is benign. The left breast is unremarkable   LAB RESULTS: Lab Results  Component Value Date   WBC 8.8 01/04/2016   NEUTROABS 5.8 01/04/2016   HGB 13.3 01/04/2016   HCT 39.5 01/04/2016   MCV 95.3 01/04/2016   PLT 238 01/04/2016      Chemistry      Component Value Date/Time   NA 137 10/23/2015 1210   NA 137 12/29/2014 1009   K 4.1 10/23/2015 1210   K 4.5 12/29/2014 1009   CL 100 10/23/2015 1210   CL 104 12/10/2012 1145   CO2 28 10/23/2015 1210   CO2 25 12/29/2014 1009   BUN 24* 10/23/2015 1210   BUN 22.7 12/29/2014 1009  CREATININE 1.43* 10/23/2015 1210   CREATININE 1.4* 12/29/2014 1009      Component Value Date/Time   CALCIUM 10.0 10/23/2015 1210   CALCIUM 10.2 12/29/2014 1009   ALKPHOS 66 06/22/2015 1031   ALKPHOS 70 12/29/2014 1009   AST 16 06/22/2015 1031   AST 17 12/29/2014 1009   ALT 14 06/22/2015 1031   ALT 13 12/29/2014 1009   BILITOT 0.6 06/22/2015 1031   BILITOT 0.70 12/29/2014 1009       Lab Results  Component Value Date   LABCA2 37 12/10/2012    STUDIES: Repeat mammography will be due in November 2017  ASSESSMENT: 82 y.o.  [place] woman   (1)  status post right lumpectomy 11/06/2011 for a T2 NX, Stage II invasive ductal carcinoma, grade 2, strongly estrogen and progesterone receptor positive, with a very elevated MIB-1, but no HER-2 amplification.   (2) declined antiestrogen therapy, followed with observation alone.  PLAN: [name] is now a little more than 4 years out from her definitive surgery, with no evidence of disease recurrence. This is very favorable.  At this point I am very comfortable releasing her to her primary  care physician. All she needs from breast cancer point of view is her yearly mammogram and a yearly physician breast exam.  Of course I will be glad to see [name] again at any point if the need arises, but as of now we're making no further routine appointments for her here. Chauncey Cruel, MD  01/04/2016

## 2016-01-23 ENCOUNTER — Ambulatory Visit (INDEPENDENT_AMBULATORY_CARE_PROVIDER_SITE_OTHER): Payer: Medicare Other | Admitting: Family

## 2016-01-23 ENCOUNTER — Encounter: Payer: Self-pay | Admitting: Family

## 2016-01-23 VITALS — BP 132/78 | HR 69 | Temp 97.8°F | Ht 66.0 in | Wt 176.2 lb

## 2016-01-23 DIAGNOSIS — I10 Essential (primary) hypertension: Secondary | ICD-10-CM | POA: Diagnosis not present

## 2016-01-23 DIAGNOSIS — E039 Hypothyroidism, unspecified: Secondary | ICD-10-CM | POA: Diagnosis not present

## 2016-01-23 DIAGNOSIS — E118 Type 2 diabetes mellitus with unspecified complications: Secondary | ICD-10-CM

## 2016-01-23 LAB — TSH: TSH: 0.56 u[IU]/mL (ref 0.35–4.50)

## 2016-01-23 LAB — HEMOGLOBIN A1C: Hgb A1c MFr Bld: 6.6 % — ABNORMAL HIGH (ref 4.6–6.5)

## 2016-01-23 MED ORDER — GABAPENTIN 100 MG PO CAPS: 100.0000 mg | ORAL_CAPSULE | Freq: Three times a day (TID) | ORAL | Status: DC

## 2016-01-23 NOTE — Progress Notes (Signed)
Subjective:    Patient ID: [name], female    DOB: 01-07-1919, 82 y.o.   MRN: [id number]  HPI  [name] is a 82 yr old female who presents today for follow up.  1) DM2- Reports sugars 111-141.  Did not cut her amaryl in half as previously recommended.  Continues [place].Denies hx of hypoglycemia. Does have some peripheral neuropathy. Uses camomile cream on her legs/feet and notes that this really helps her neuropathy pain.   Lab Results  Component Value Date   HGBA1C 6.6* 01/23/2016   HGBA1C 6.5 10/23/2015   HGBA1C 6.2 06/22/2015   Lab Results  Component Value Date   LDLCALC 92 12/16/2013   CREATININE 1.5* 01/04/2016    2) HTN-  Reports bp's have been stable. She is maintained on amlodipine and micardis HCT.  BP Readings from Last 3 Encounters:  01/23/16 132/78  01/04/16 150/57  10/23/15 151/59   3) Hypothyroid- maintained on Synthroid.   Review of Systems  Respiratory: Negative for shortness of breath.   Cardiovascular: Negative for chest pain and palpitations.   Past Medical History  Diagnosis Date  . Diabetes mellitus, type 2 ([place])   . Gout   . Hyperlipidemia   . Hyperthyroidism   . Cancer [facility])     right breast  . Anxiety     loss of child 1 year ago  . Hypertension     states eccho Dr Mare Ferrari 1 year ago- no record found in EPIC or office records    Social History   Social History  . Marital Status: Widowed    Spouse Name: N/A  . Number of Children: N/A  . Years of Education: N/A   Occupational History  . Not on file.   Social History Main Topics  . Smoking status: Never Smoker   . Smokeless tobacco: Never Used  . Alcohol Use: No  . Drug Use: No  . Sexual Activity: Not on file   Other Topics Concern  . Not on file   Social History Narrative   Daughter helps with pt considerably- she lives across the street from her daughter   Homemaker   Completed high school   2 children, both daughters, one died at age 21   Enjoys spending time  outside, yard work.   No pets       Past Surgical History  Procedure Laterality Date  . Rotator cuff repair  1999    right  . Abdominal hysterectomy  1995  . Cholecystectomy  1976  . Breast lumpectomy  11/06/2011    Procedure: LUMPECTOMY;  Surgeon: Harl Bowie, MD;  Location[facility];  Service: General;  Laterality: Right;    Family History  Problem Relation Age of Onset  . Diabetes Neg Hx   . Hypertension Neg Hx   . Cancer Mother     bladder  . Stroke Brother     No Known Allergies  Current Outpatient Prescriptions on File Prior to Visit  Medication Sig Dispense Refill  . acebutolol (SECTRAL) 400 MG capsule Take 1 capsule (400 mg total) by mouth at bedtime. 90 capsule 1  . amLODipine (NORVASC) 5 MG tablet Take 1 tablet (5 mg total) by mouth every morning. 90 tablet 1  . atorvastatin (LIPITOR) 10 MG tablet Take 1 tablet (10 mg total) by mouth at bedtime. 90 tablet 1  . diclofenac sodium (VOLTAREN) 1 % GEL Apply 4 g topically 4 (four) times daily. 100 g 1  . glimepiride (AMARYL)  4 MG tablet Take 0.5 tablets (2 mg total) by mouth every morning. 90 tablet 3  . levothyroxine (SYNTHROID, LEVOTHROID) 100 MCG tablet Take 1 tablet (100 mcg total) by mouth daily before breakfast. 90 tablet 1  . sitaGLIPtin (JANUVIA) 50 MG tablet Take 1 tablet (50 mg total) by mouth every morning. 90 tablet 1  . telmisartan-hydrochlorothiazide (MICARDIS HCT) 80-12.5 MG tablet Take 1 tablet by mouth every morning. 90 tablet 1  . glucose blood test strip Use as instructed to test glucose levels once daily. Dx. E11.9 (Patient not taking: Reported on 01/23/2016) 100 each 1   No current facility-administered medications on file prior to visit.    BP 132/78 mmHg  Pulse 69  Temp(Src) 97.8 F (36.6 C) (Oral)  Ht 5\' 6"  (1.676 m)  Wt 176 lb 4 oz (79.946 kg)  BMI 28.46 kg/m2  SpO2 96%       Objective:   Physical Exam  Constitutional: She is oriented to person, place, and time. She appears  well-developed and well-nourished.  Cardiovascular: Normal rate, regular rhythm and normal heart sounds.   No murmur heard. Pulmonary/Chest: Effort normal and breath sounds normal. No respiratory distress. She has no wheezes.  Musculoskeletal: She exhibits no edema.  Neurological: She is alert and oriented to person, place, and time.  Psychiatric: She has a normal mood and affect. Her behavior is normal. Judgment and thought content normal.          Assessment & Plan:

## 2016-01-23 NOTE — Patient Instructions (Signed)
Please cut amaryl in half.  Complete lab work prior to leaving.   Start gabapentin for your neuropathy.

## 2016-01-23 NOTE — Progress Notes (Signed)
Pre visit review using our clinic review tool, if applicable. No additional management support is needed unless otherwise documented below in the visit note. 

## 2016-01-26 ENCOUNTER — Telehealth: Payer: Self-pay | Admitting: Family

## 2016-01-26 DIAGNOSIS — E039 Hypothyroidism, unspecified: Secondary | ICD-10-CM

## 2016-01-26 DIAGNOSIS — E785 Hyperlipidemia, unspecified: Secondary | ICD-10-CM

## 2016-01-26 DIAGNOSIS — E1169 Type 2 diabetes mellitus with other specified complication: Secondary | ICD-10-CM

## 2016-01-26 MED ORDER — LEVOTHYROXINE SODIUM 88 MCG PO TABS: 88.0000 ug | ORAL_TABLET | Freq: Every day | ORAL | Status: DC

## 2016-01-26 NOTE — Assessment & Plan Note (Signed)
Slightly overtreated, decrease synthroid from 160mcg to 27mcg- see phone noted. Lab Results  Component Value Date   TSH 0.56 01/23/2016

## 2016-01-26 NOTE — Telephone Encounter (Signed)
A1C looks good. TSH also looks ok, but I think we should decrease her synthroid slightly from 149mcg to 88 mcg.  I sent rx to mail order. She should repeat TSH in [redacted] weeks along with a lipid panel hyperlipidemia please.

## 2016-01-26 NOTE — Assessment & Plan Note (Signed)
I remain concerned about possibility of hypoglycemia and advised pt to cut amaryl dose in half.  Give her age, we certainly have room to let her sugars rise a touch given her tight glycemic control.  Lab Results  Component Value Date   HGBA1C 6.6* 01/23/2016

## 2016-01-26 NOTE — Assessment & Plan Note (Signed)
BP stable on current meds. Continue same.  

## 2016-01-30 NOTE — Telephone Encounter (Signed)
Left message at home # for pt to return my call.

## 2016-01-30 NOTE — Telephone Encounter (Signed)
Notified pt's daughter and she voices understanding. Lab appt scheduled for 03/12/16 at 9:30am. Future orders entered.

## 2016-03-12 ENCOUNTER — Other Ambulatory Visit (INDEPENDENT_AMBULATORY_CARE_PROVIDER_SITE_OTHER): Payer: Medicare Other

## 2016-03-12 DIAGNOSIS — E039 Hypothyroidism, unspecified: Secondary | ICD-10-CM | POA: Diagnosis not present

## 2016-03-12 DIAGNOSIS — E785 Hyperlipidemia, unspecified: Secondary | ICD-10-CM | POA: Diagnosis not present

## 2016-03-12 DIAGNOSIS — E1169 Type 2 diabetes mellitus with other specified complication: Secondary | ICD-10-CM

## 2016-03-12 LAB — LIPID PANEL
CHOL/HDL RATIO: 4
CHOLESTEROL: 181 mg/dL (ref 0–200)
HDL: 48.7 mg/dL (ref >39.00)
LDL CALC: 93 mg/dL (ref 0–99)
NonHDL: 132.26
TRIGLYCERIDES: 196 mg/dL — AB (ref 0.0–149.0)
VLDL: 39.2 mg/dL (ref 0.0–40.0)

## 2016-03-12 LAB — TSH: TSH: 2.52 u[IU]/mL (ref 0.35–4.50)

## 2016-03-13 ENCOUNTER — Encounter: Payer: Self-pay | Admitting: Family

## 2016-03-25 ENCOUNTER — Telehealth: Payer: Self-pay | Admitting: Family

## 2016-03-25 NOTE — Telephone Encounter (Signed)
Pt's daughter [name]) dropped off paper work to be filled out ([place] [employer] (physical sciences))

## 2016-03-25 NOTE — Telephone Encounter (Signed)
Forwarded to [employer]. JG//CMA

## 2016-03-28 NOTE — Telephone Encounter (Signed)
Called and informed pt's daughter that forms are ready for pick up at our front desk. Copy sent for scanning. JG//CMA

## 2016-04-23 ENCOUNTER — Ambulatory Visit (INDEPENDENT_AMBULATORY_CARE_PROVIDER_SITE_OTHER): Payer: Medicare Other | Admitting: Family

## 2016-04-23 ENCOUNTER — Encounter: Payer: Self-pay | Admitting: Family

## 2016-04-23 VITALS — BP 162/80 | HR 60 | Temp 98.0°F | Resp 20 | Ht 66.0 in | Wt 184.6 lb

## 2016-04-23 DIAGNOSIS — I10 Essential (primary) hypertension: Secondary | ICD-10-CM | POA: Diagnosis not present

## 2016-04-23 DIAGNOSIS — E039 Hypothyroidism, unspecified: Secondary | ICD-10-CM

## 2016-04-23 DIAGNOSIS — G6289 Other specified polyneuropathies: Secondary | ICD-10-CM

## 2016-04-23 DIAGNOSIS — G629 Polyneuropathy, unspecified: Secondary | ICD-10-CM | POA: Insufficient documentation

## 2016-04-23 DIAGNOSIS — E1142 Type 2 diabetes mellitus with diabetic polyneuropathy: Secondary | ICD-10-CM

## 2016-04-23 DIAGNOSIS — M545 Low back pain: Secondary | ICD-10-CM

## 2016-04-23 DIAGNOSIS — E1122 Type 2 diabetes mellitus with diabetic chronic kidney disease: Secondary | ICD-10-CM

## 2016-04-23 DIAGNOSIS — M1990 Unspecified osteoarthritis, unspecified site: Secondary | ICD-10-CM

## 2016-04-23 LAB — BASIC METABOLIC PANEL
BUN: 32 mg/dL — ABNORMAL HIGH (ref 6–23)
CALCIUM: 9.9 mg/dL (ref 8.4–10.5)
CO2: 26 mEq/L (ref 19–32)
Chloride: 102 mEq/L (ref 96–112)
Creatinine, Ser: 1.33 mg/dL — ABNORMAL HIGH (ref 0.40–1.20)
GFR: 39.43 mL/min — AB (ref >60.00)
GLUCOSE: 118 mg/dL — AB (ref 70–99)
POTASSIUM: 4 meq/L (ref 3.5–5.1)
SODIUM: 137 meq/L (ref 135–145)

## 2016-04-23 LAB — MICROALBUMIN / CREATININE URINE RATIO
Creatinine,U: 51.3 mg/dL
MICROALB UR: 9.9 mg/dL — AB (ref 0.0–1.9)
MICROALB/CREAT RATIO: 19.3 mg/g (ref 0.0–30.0)

## 2016-04-23 LAB — HEMOGLOBIN A1C: HEMOGLOBIN A1C: 6.6 % — AB (ref 4.6–6.5)

## 2016-04-23 MED ORDER — LEVOTHYROXINE SODIUM 88 MCG PO TABS: 88.0000 ug | ORAL_TABLET | Freq: Every day | ORAL | Status: DC

## 2016-04-23 MED ORDER — DICLOFENAC SODIUM 1 % TD GEL: 4.0000 g | Freq: Four times a day (QID) | TRANSDERMAL | Status: DC

## 2016-04-23 NOTE — Progress Notes (Signed)
Subjective:    Patient ID: [name], female    DOB: [birth year], 82 y.o.   MRN: [id number]  HPI  [name] is a 82 yr old female who presents today for follow up.  1) DM2- maintained on amaryl, januvia. Last visit her amaryl dose was cut in half to decrease risk of hypoglycemia.  Reports sugars remain in the 130-140's fasting.    Lab Results  Component Value Date   HGBA1C 6.6* 01/23/2016   HGBA1C 6.5 10/23/2015   HGBA1C 6.2 06/22/2015   Lab Results  Component Value Date   LDLCALC 93 03/12/2016   CREATININE 1.5* 01/04/2016   2) HTN- maintained on amlodipine 5mg , micardis HCT. Pt took meds this AM.   BP Readings from Last 3 Encounters:  04/23/16 170/70  01/23/16 132/78  01/04/16 150/57   3) Hypothyroid- maintained on levothyroxine 62mcg.  Lab Results  Component Value Date   TSH 2.52 03/12/2016   Has some low back stiffness in the AM.  Better after  She has been up.  Gabapentin seems to have helped her foot pain.    Review of Systems  Cardiovascular: Negative for chest pain and leg swelling.   Past Medical History  Diagnosis Date  . Diabetes mellitus, type 2 ([place])   . Gout   . Hyperlipidemia   . Hyperthyroidism   . Cancer [facility])     right breast  . Anxiety     loss of child 1 year ago  . Hypertension     states eccho Dr Mare Ferrari 1 year ago- no record found in EPIC or office records     Social History   Social History  . Marital Status: Widowed    Spouse Name: N/A  . Number of Children: N/A  . Years of Education: N/A   Occupational History  . Not on file.   Social History Main Topics  . Smoking status: Never Smoker   . Smokeless tobacco: Never Used  . Alcohol Use: No  . Drug Use: No  . Sexual Activity: Not on file   Other Topics Concern  . Not on file   Social History Narrative   Daughter helps with pt considerably- she lives across the street from her daughter   Homemaker   Completed high school   2 children, both daughters, one died at  age 27   Enjoys spending time outside, yard work.   No pets       Past Surgical History  Procedure Laterality Date  . Rotator cuff repair  1999    right  . Abdominal hysterectomy  1995  . Cholecystectomy  1976  . Breast lumpectomy  11/06/2011    Procedure: LUMPECTOMY;  Surgeon: Harl Bowie, MD;  Location: [facility];  Service: General;  Laterality: Right;    Family History  Problem Relation Age of Onset  . Diabetes Neg Hx   . Hypertension Neg Hx   . Cancer Mother     bladder  . Stroke Brother     No Known Allergies  Current Outpatient Prescriptions on File Prior to Visit  Medication Sig Dispense Refill  . acebutolol (SECTRAL) 400 MG capsule Take 1 capsule (400 mg total) by mouth at bedtime. 90 capsule 1  . amLODipine (NORVASC) 5 MG tablet Take 1 tablet (5 mg total) by mouth every morning. 90 tablet 1  . atorvastatin (LIPITOR) 10 MG tablet Take 1 tablet (10 mg total) by mouth at bedtime. 90 tablet 1  . diclofenac sodium (  VOLTAREN) 1 % GEL Apply 4 g topically 4 (four) times daily. 100 g 1  . gabapentin (NEURONTIN) 100 MG capsule Take 1 capsule (100 mg total) by mouth 3 (three) times daily. 270 capsule 1  . glimepiride (AMARYL) 4 MG tablet Take 0.5 tablets (2 mg total) by mouth every morning. 90 tablet 3  . glucose blood test strip Use as instructed to test glucose levels once daily. Dx. E11.9 100 each 1  . levothyroxine (SYNTHROID, LEVOTHROID) 88 MCG tablet Take 1 tablet (88 mcg total) by mouth daily. 90 tablet 0  . sitaGLIPtin (JANUVIA) 50 MG tablet Take 1 tablet (50 mg total) by mouth every morning. 90 tablet 1  . telmisartan-hydrochlorothiazide (MICARDIS HCT) 80-12.5 MG tablet Take 1 tablet by mouth every morning. 90 tablet 1   No current facility-administered medications on file prior to visit.    BP 170/70 mmHg  Pulse 60  Temp(Src) 98 F (36.7 C) (Oral)  Resp 20  Ht 5\' 6"  (1.676 m)  Wt 184 lb 9.6 oz (83.734 kg)  BMI 29.81 kg/m2  SpO2 99%       Objective:     Physical Exam  Constitutional: She is oriented to person, place, and time. She appears well-developed and well-nourished.  HENT:  Head: Normocephalic and atraumatic.  Cardiovascular: Normal rate, regular rhythm and normal heart sounds.   No murmur heard. Pulmonary/Chest: Effort normal and breath sounds normal. No respiratory distress. She has no wheezes.  Musculoskeletal: She exhibits no edema.  Neurological: She is alert and oriented to person, place, and time.  Psychiatric: She has a normal mood and affect. Her behavior is normal. Judgment and thought content normal.          Assessment & Plan:

## 2016-04-23 NOTE — Assessment & Plan Note (Signed)
BP is up today. She has a home meter. Advised pt and daughter to check bp once daily and send me readings via mychart in 1 week.  Continue current medications for now.

## 2016-04-23 NOTE — Patient Instructions (Addendum)
Complete lab work prior to leaving.  Please check blood pressure once daily at home for 1 week, then send me your readings in mychart. You will be contacted about your referral to home health for physical therapy. Please try taking tylenol 500mg  tabs- two tabs at bedtime and 2 tabs in AM.

## 2016-04-23 NOTE — Assessment & Plan Note (Signed)
Stable. Continue current meds. Obtain A1c.

## 2016-04-23 NOTE — Assessment & Plan Note (Signed)
Improved on gabapentin, continue same.

## 2016-04-23 NOTE — Assessment & Plan Note (Signed)
Advised pt to try adding tylenol 1000mg  bid.  For back pain- refer for PT. This will also help with her gait intability.

## 2016-04-23 NOTE — Progress Notes (Signed)
Pre visit review using our clinic review tool, if applicable. No additional management support is needed unless otherwise documented below in the visit note. 

## 2016-04-23 NOTE — Assessment & Plan Note (Signed)
Stable on synthroid, continue same.   ?

## 2016-04-26 NOTE — Telephone Encounter (Signed)
Pt dropped off page 2 (pt medical history) of [place] forms below with letter from [facility] that page 2 was not previously completed. Page completed and mailed to [facility] in preaddressed envelope (Medical Review Unit. [address] [place], [place] [address]) via direct mail.

## 2016-05-01 ENCOUNTER — Telehealth: Payer: Self-pay | Admitting: Family

## 2016-05-01 NOTE — Telephone Encounter (Signed)
Patient is 89, BPs range from 117 to 159. Recommend no change for now, continue checking BPs 3 or 4 times a week and call  PCP in 2 weeks. Call if BPs are consistently more than 160.

## 2016-05-01 NOTE — Telephone Encounter (Signed)
Pt's daughter [name] called in to provide the pt's BP readings for a week to PCP:   Starting on 04/23/16 151/76 , 159/75 , 158/79 , 132/66 ,158/76 ,140/78 ,143/72 , 114/53 ,114/53, 117/62   CB: [id number]

## 2016-05-01 NOTE — Telephone Encounter (Signed)
Please advise in Melissa's absence?

## 2016-05-02 NOTE — Telephone Encounter (Signed)
Notified pt's daughter and she voices understanding. 

## 2016-05-10 NOTE — Telephone Encounter (Signed)
Fax twice confirmation received

## 2016-05-10 NOTE — Telephone Encounter (Addendum)
[name] (daughter) stating DMV never received page 2 faxed to [facility] Medical Review [phone]

## 2016-05-15 ENCOUNTER — Telehealth: Payer: Self-pay | Admitting: *Deleted

## 2016-05-15 NOTE — Telephone Encounter (Signed)
Home Health Referral and face to face form was brought in to the office by Babs Bertin stating form needs MD signature. Form forwarded to Dr Charlett Blake and will need to be faxed back to [phone] attn: Raquel Sarna once it has been signed.

## 2016-05-16 ENCOUNTER — Telehealth: Payer: Self-pay | Admitting: Family

## 2016-05-16 ENCOUNTER — Ambulatory Visit (INDEPENDENT_AMBULATORY_CARE_PROVIDER_SITE_OTHER): Payer: Medicare Other | Admitting: *Deleted

## 2016-05-16 DIAGNOSIS — I10 Essential (primary) hypertension: Secondary | ICD-10-CM

## 2016-05-16 NOTE — Telephone Encounter (Signed)
Form signed by MD and faxed to below #.

## 2016-05-16 NOTE — Telephone Encounter (Signed)
BP looks good. Please continue current medications.

## 2016-05-16 NOTE — Telephone Encounter (Signed)
Caller name: [name] Relationship to patient: Daughter Can be reached: [phone]   Reason for call: Daughter called with BP readings   6/15: 149/67 6/17: 130/69 6/18: 133/62 6/19: 133/55 6/21: 125/63 6/23: 148/63 6/24: 132/62 6/26: 149/60

## 2016-05-17 NOTE — Telephone Encounter (Signed)
Pt's daughter notified of below and verbalized understanding. No further questions or concerns at time of call.

## 2016-05-17 NOTE — Progress Notes (Signed)
Enrollment verified w/ pt's daughter. Pt's daughter notified of BP recommendations (see phone note).  Attempted to call pt at number on file. No answer and was unable to leave voicemail.  Time spent: 2 minutes

## 2016-06-07 ENCOUNTER — Ambulatory Visit (INDEPENDENT_AMBULATORY_CARE_PROVIDER_SITE_OTHER): Payer: Medicare Other

## 2016-06-07 ENCOUNTER — Ambulatory Visit (INDEPENDENT_AMBULATORY_CARE_PROVIDER_SITE_OTHER): Payer: Medicare Other | Admitting: Podiatry

## 2016-06-07 DIAGNOSIS — M79671 Pain in right foot: Secondary | ICD-10-CM | POA: Diagnosis not present

## 2016-06-07 DIAGNOSIS — B351 Tinea unguium: Secondary | ICD-10-CM

## 2016-06-07 DIAGNOSIS — M79672 Pain in left foot: Secondary | ICD-10-CM

## 2016-06-07 DIAGNOSIS — E1151 Type 2 diabetes mellitus with diabetic peripheral angiopathy without gangrene: Secondary | ICD-10-CM | POA: Diagnosis not present

## 2016-06-07 NOTE — Progress Notes (Signed)
   Subjective:    Patient ID: [name], female    DOB: 09-03-1919, 82 y.o.   MRN: [id number]  HPI  I need him to trim my big toe nails.  The bottom of my feet hurt only when I stand up I think my arches have fallen or maybe I have neuropathy    Review of Systems     Objective:   Physical Exam        Assessment & Plan:

## 2016-06-12 NOTE — Progress Notes (Signed)
Subjective:     Patient ID: [name], female   DOB: 1919/07/25, 82 y.o.   MRN: [id number]  HPI patient presents stating I have nail disease 1-5 both feet that I cannot cut and also I'm getting pain in my feet and I'm concerned might be neuropathy or another condition   Review of Systems  All other systems reviewed and are negative.      Objective:   Physical Exam  Constitutional: She is oriented to person, place, and time.  Cardiovascular: Intact distal pulses.   Musculoskeletal: Normal range of motion.  Neurological: She is oriented to person, place, and time.  Skin: Skin is warm and dry.  Nursing note and vitals reviewed.  neurovascular status found to be diminished but intact with diminished range of motion subtalar midtarsal joint and diminished range of motion of the first MPJ bilateral. Patient's found to have severe thickness of nailbeds 1-5 both feet that are painful and incurvated in the corners with brittle debris and is also noted to have mild forefoot pain bilateral that's localized with no diminishment currently of sharp dull and mild diminishment of vibratory and DTR reflexes. Patient has good digital perfusion and is well oriented 3     Assessment:     Mild to moderate inflammatory condition with mycotic nail infection 1-5 both feet with pain    Plan:     H&P and conditions all reviewed with patient along with x-rays. At this point I recommended thicker-type shoes and not going barefoot and today I debrided painful nailbeds 1-5 both feet with no iatrogenic bleeding and recommended routine care due to patient's inability to take care of herself. She'll be seen back 3 months or earlier if needed  Lytle Michaels report was negative for signs of stress fracture arthritis with mild osteoporosis

## 2016-06-18 ENCOUNTER — Telehealth: Payer: Self-pay | Admitting: Family

## 2016-06-18 NOTE — Telephone Encounter (Signed)
Pt's daughter called in to request a refill request for all of pt medications.    [name] - [phone]   Pharmacy: [facility], [place]

## 2016-07-05 ENCOUNTER — Ambulatory Visit (HOSPITAL_BASED_OUTPATIENT_CLINIC_OR_DEPARTMENT_OTHER)
Admission: RE | Admit: 2016-07-05 | Discharge: 2016-07-05 | Disposition: A | Discharge status: Home / Self Care | Payer: Medicare Other | Source: Ambulatory Visit | Attending: Family | Admitting: Family

## 2016-07-05 ENCOUNTER — Ambulatory Visit (INDEPENDENT_AMBULATORY_CARE_PROVIDER_SITE_OTHER): Payer: Medicare Other | Admitting: Family

## 2016-07-05 ENCOUNTER — Encounter: Payer: Self-pay | Admitting: Family

## 2016-07-05 VITALS — BP 142/70 | HR 64 | Temp 97.8°F | Resp 18 | Ht 66.0 in | Wt 184.0 lb

## 2016-07-05 DIAGNOSIS — M25551 Pain in right hip: Secondary | ICD-10-CM | POA: Insufficient documentation

## 2016-07-05 DIAGNOSIS — M5136 Other intervertebral disc degeneration, lumbar region: Secondary | ICD-10-CM | POA: Diagnosis not present

## 2016-07-05 DIAGNOSIS — I700 Atherosclerosis of aorta: Secondary | ICD-10-CM | POA: Insufficient documentation

## 2016-07-05 MED ORDER — ATORVASTATIN CALCIUM 10 MG PO TABS: 10.0000 mg | ORAL_TABLET | Freq: Every day | ORAL | 1 refills | Status: DC

## 2016-07-05 MED ORDER — LEVOTHYROXINE SODIUM 88 MCG PO TABS: 88.0000 ug | ORAL_TABLET | Freq: Every day | ORAL | 1 refills | Status: DC

## 2016-07-05 MED ORDER — AMLODIPINE BESYLATE 5 MG PO TABS: 5.0000 mg | ORAL_TABLET | ORAL | 1 refills | Status: DC

## 2016-07-05 MED ORDER — GABAPENTIN 100 MG PO CAPS: 100.0000 mg | ORAL_CAPSULE | Freq: Three times a day (TID) | ORAL | 1 refills | Status: DC

## 2016-07-05 MED ORDER — GLIMEPIRIDE 4 MG PO TABS: 2.0000 mg | ORAL_TABLET | ORAL | 3 refills | Status: DC

## 2016-07-05 MED ORDER — ACEBUTOLOL HCL 400 MG PO CAPS: 400.0000 mg | ORAL_CAPSULE | Freq: Every day | ORAL | 1 refills | Status: DC

## 2016-07-05 MED ORDER — SITAGLIPTIN PHOSPHATE 50 MG PO TABS
50.0000 mg | ORAL_TABLET | ORAL | 1 refills | Status: DC
Start: 2016-07-05 — End: 2017-02-05

## 2016-07-05 MED ORDER — TELMISARTAN-HCTZ 80-12.5 MG PO TABS: 1.0000 | ORAL_TABLET | ORAL | 1 refills | Status: DC

## 2016-07-05 MED ORDER — DICLOFENAC SODIUM 1 % TD GEL
4.0000 g | Freq: Four times a day (QID) | TRANSDERMAL | 1 refills | Status: DC
Start: 2016-07-05 — End: 2018-04-14

## 2016-07-05 MED ORDER — TRAMADOL HCL 50 MG PO TABS: 50.0000 mg | ORAL_TABLET | Freq: Two times a day (BID) | ORAL | 0 refills | Status: DC | PRN

## 2016-07-05 NOTE — Progress Notes (Signed)
Subjective:    Patient ID: [name], female    DOB: 1919/05/05, 82 y.o.   MRN: [id number]  HPI   [name] is a 82 yr old female who presents today to discuss R Hip pain- right hip. Describes pain as a grabbing pain. Pain is worst in the AM.  She continues tylenol bid.  Pain improves as the day wears on.     Review of Systems See HPI  Past Medical History:  Diagnosis Date  . Anxiety    loss of child 1 year ago  . Cancer ([place])    right breast  . Diabetes mellitus, type 2 ([place])   . Gout   . Hyperlipidemia   . Hypertension    states eccho Dr Mare Ferrari 1 year ago- no record found in [facility] or office records  . Hyperthyroidism      Social History   Social History  . Marital status: Widowed    Spouse name: N/A  . Number of children: N/A  . Years of education: N/A   Occupational History  . Not on file.   Social History Main Topics  . Smoking status: Never Smoker  . Smokeless tobacco: Never Used  . Alcohol use No  . Drug use: No  . Sexual activity: Not on file   Other Topics Concern  . Not on file   Social History Narrative   Daughter helps with pt considerably- she lives across the street from her daughter   Homemaker   Completed high school   2 children, both daughters, one died at age 6   Enjoys spending time outside, yard work.   No pets       Past Surgical History:  Procedure Laterality Date  . ABDOMINAL HYSTERECTOMY  1995  . BREAST LUMPECTOMY  11/06/2011   Procedure: LUMPECTOMY;  Surgeon: Harl Bowie, MD;  Location: [facility];  Service: General;  Laterality: Right;  . CHOLECYSTECTOMY  1976  . ROTATOR CUFF REPAIR  1999   right    Family History  Problem Relation Age of Onset  . Diabetes Neg Hx   . Hypertension Neg Hx   . Cancer Mother     bladder  . Stroke Brother     No Known Allergies  Current Outpatient Prescriptions on File Prior to Visit  Medication Sig Dispense Refill  . acebutolol (SECTRAL) 400 MG capsule Take 1 capsule (400  mg total) by mouth at bedtime. 90 capsule 1  . amLODipine (NORVASC) 5 MG tablet Take 1 tablet (5 mg total) by mouth every morning. 90 tablet 1  . atorvastatin (LIPITOR) 10 MG tablet Take 1 tablet (10 mg total) by mouth at bedtime. 90 tablet 1  . diclofenac sodium (VOLTAREN) 1 % GEL Apply 4 g topically 4 (four) times daily. 100 g 1  . gabapentin (NEURONTIN) 100 MG capsule Take 1 capsule (100 mg total) by mouth 3 (three) times daily. 270 capsule 1  . glimepiride (AMARYL) 4 MG tablet Take 0.5 tablets (2 mg total) by mouth every morning. 90 tablet 3  . glucose blood test strip Use as instructed to test glucose levels once daily. Dx. E11.9 100 each 1  . levothyroxine (SYNTHROID, LEVOTHROID) 88 MCG tablet Take 1 tablet (88 mcg total) by mouth daily. 90 tablet 1  . sitaGLIPtin (JANUVIA) 50 MG tablet Take 1 tablet (50 mg total) by mouth every morning. 90 tablet 1  . telmisartan-hydrochlorothiazide (MICARDIS HCT) 80-12.5 MG tablet Take 1 tablet by mouth every morning. [place]  tablet 1   No current facility-administered medications on file prior to visit.     BP (!) 142/70   Pulse 64   Temp 97.8 F (36.6 C) (Oral)   Resp 18   Ht 5\' 6"  (1.676 m)   Wt 184 lb (83.5 kg)   SpO2 98% Comment: room air  BMI 29.70 kg/m       Objective:   Physical Exam  Constitutional: She is oriented to person, place, and time. She appears well-developed and well-nourished.  Cardiovascular: Normal rate, regular rhythm and normal heart sounds.   No murmur heard. Pulmonary/Chest: Effort normal and breath sounds normal. No respiratory distress. She has no wheezes.  Musculoskeletal: She exhibits no edema.       Thoracic back: She exhibits no tenderness.       Lumbar back: She exhibits no tenderness.  Neurological: She is alert and oriented to person, place, and time.  Psychiatric: She has a normal mood and affect. Her behavior is normal. Judgment and thought content normal.          Assessment & Plan:  R hip pain-  will obtain x ray of the right hip and lumbar spine, suspect that pain is referred from the lumbar spine. Trial of tramadol prn. Advised her to start with a 1/2 tab as needed. If she decides to continue long term will need to have her sign a controlled substance contract at the front desk.

## 2016-07-05 NOTE — Progress Notes (Signed)
Pre visit review using our clinic review tool, if applicable. No additional management support is needed unless otherwise documented below in the visit note. 

## 2016-07-05 NOTE — Patient Instructions (Signed)
Continue tylenol for back pain. If pain is severe, you may try tramadol. Please complete x rays on the first floor.

## 2016-07-08 ENCOUNTER — Encounter: Payer: Self-pay | Admitting: Family

## 2016-07-08 ENCOUNTER — Telehealth: Payer: Self-pay | Admitting: Family

## 2016-07-08 NOTE — Telephone Encounter (Signed)
Pt's daughter [name] is returning CMA's call.   CB: [phone]

## 2016-07-09 NOTE — Telephone Encounter (Signed)
Notes Recorded by Debbrah Alar, NP on 07/05/2016 at 12:45 PM EDT Please let pt know that she has some degenerative disc changes in her lumbar spine. Hip look good. OK to try tramadol as needed (rx given today).  Pt's daughter notified of results and verbalized understanding. She also asked if pt's med refills could be sent to mail order. Informed her that refills were sent 07/05/16.

## 2016-07-09 NOTE — Telephone Encounter (Signed)
Pt's daughter returned call to speak with CMA.   Cheryl SchwartzNQ:[id number]

## 2016-07-09 NOTE — Telephone Encounter (Signed)
see Carolyn's note .      KP

## 2016-07-23 ENCOUNTER — Ambulatory Visit: Payer: Self-pay | Admitting: Family

## 2016-08-28 DIAGNOSIS — Z23 Encounter for immunization: Secondary | ICD-10-CM | POA: Diagnosis not present

## 2016-09-04 ENCOUNTER — Ambulatory Visit (INDEPENDENT_AMBULATORY_CARE_PROVIDER_SITE_OTHER): Payer: Medicare Other | Admitting: Family

## 2016-09-04 ENCOUNTER — Encounter: Payer: Self-pay | Admitting: Family

## 2016-09-04 VITALS — BP 147/66 | HR 62 | Temp 97.7°F | Resp 16 | Ht 66.0 in | Wt 182.6 lb

## 2016-09-04 DIAGNOSIS — E119 Type 2 diabetes mellitus without complications: Secondary | ICD-10-CM

## 2016-09-04 DIAGNOSIS — E118 Type 2 diabetes mellitus with unspecified complications: Secondary | ICD-10-CM

## 2016-09-04 DIAGNOSIS — E785 Hyperlipidemia, unspecified: Secondary | ICD-10-CM

## 2016-09-04 DIAGNOSIS — E039 Hypothyroidism, unspecified: Secondary | ICD-10-CM | POA: Diagnosis not present

## 2016-09-04 DIAGNOSIS — E1142 Type 2 diabetes mellitus with diabetic polyneuropathy: Secondary | ICD-10-CM | POA: Diagnosis not present

## 2016-09-04 DIAGNOSIS — E1169 Type 2 diabetes mellitus with other specified complication: Secondary | ICD-10-CM | POA: Diagnosis not present

## 2016-09-04 LAB — TSH: TSH: 1.21 u[IU]/mL (ref 0.35–4.50)

## 2016-09-04 LAB — BASIC METABOLIC PANEL
BUN: 28 mg/dL — ABNORMAL HIGH (ref 6–23)
CALCIUM: 9.9 mg/dL (ref 8.4–10.5)
CHLORIDE: 103 meq/L (ref 96–112)
CO2: 28 meq/L (ref 19–32)
Creatinine, Ser: 1.41 mg/dL — ABNORMAL HIGH (ref 0.40–1.20)
GFR: 36.83 mL/min — ABNORMAL LOW (ref >60.00)
GLUCOSE: 133 mg/dL — AB (ref 70–99)
Potassium: 4.3 mEq/L (ref 3.5–5.1)
SODIUM: 139 meq/L (ref 135–145)

## 2016-09-04 LAB — HEMOGLOBIN A1C: Hgb A1c MFr Bld: 6.4 % (ref 4.6–6.5)

## 2016-09-04 MED ORDER — GLUCOSE BLOOD VI STRP
ORAL_STRIP | 1 refills | Status: DC
Start: 2016-09-04 — End: 2017-02-04

## 2016-09-04 NOTE — Assessment & Plan Note (Signed)
Clinically stable on synthroid, continue same. Obtain follow up tsh.  

## 2016-09-04 NOTE — Progress Notes (Signed)
Pre visit review using our clinic review tool, if applicable. No additional management support is needed unless otherwise documented below in the visit note. 

## 2016-09-04 NOTE — Patient Instructions (Addendum)
Please complete lab work prior to leaving.   

## 2016-09-04 NOTE — Progress Notes (Signed)
Subjective:    Patient ID: [name], female    DOB: Jul 24, 1919, 82 y.o.   MRN: [id number]  HPI  [name] is a 82 yr old female who presents today for follow up.  1) HTN- patient is maintained on amlodipine. And micardis hct. BP Readings from Last 3 Encounters:  09/04/16 (!) 147/66  07/05/16 (!) 142/70  04/23/16 (!) 162/80   2) DM2- maintained on [place] and amaryl.  Denies hypoglycemia. Usual sugars 130-140.  She does report some burning in her feet. She continues gabapentin and reports that she rubs a cosmetic cream on her feet and it really helps.   Lab Results  Component Value Date   HGBA1C 6.6 (H) 04/23/2016   HGBA1C 6.6 (H) 01/23/2016   HGBA1C 6.5 10/23/2015   Lab Results  Component Value Date   MICROALBUR 9.9 (H) 04/23/2016   LDLCALC 93 03/12/2016   CREATININE 1.33 (H) 04/23/2016   3) Hypothyroid- continues synthroid. Feels good on current dose.  Lab Results  Component Value Date   TSH 2.52 03/12/2016        Review of Systems  Respiratory: Negative for shortness of breath.   Cardiovascular: Negative for chest pain and leg swelling.   Past Medical History:  Diagnosis Date  . Anxiety    loss of child 1 year ago  . Cancer ([place])    right breast  . Diabetes mellitus, type 2 ([place])   . Gout   . Hyperlipidemia   . Hypertension    states eccho Dr Mare Ferrari 1 year ago- no record found in [facility] or office records  . Hyperthyroidism      Social History   Social History  . Marital status: Widowed    Spouse name: N/A  . Number of children: N/A  . Years of education: N/A   Occupational History  . Not on file.   Social History Main Topics  . Smoking status: Never Smoker  . Smokeless tobacco: Never Used  . Alcohol use No  . Drug use: No  . Sexual activity: Not on file   Other Topics Concern  . Not on file   Social History Narrative   Daughter helps with pt considerably- she lives across the street from her daughter   Homemaker   Completed high  school   2 children, both daughters, one died at age 23   Enjoys spending time outside, yard work.   No pets       Past Surgical History:  Procedure Laterality Date  . ABDOMINAL HYSTERECTOMY  1995  . BREAST LUMPECTOMY  11/06/2011   Procedure: LUMPECTOMY;  Surgeon: Harl Bowie, MD;  [facility]: [facility];  Service: General;  Laterality: Right;  . CHOLECYSTECTOMY  1976  . ROTATOR CUFF REPAIR  1999   right    Family History  Problem Relation Age of Onset  . Diabetes Neg Hx   . Hypertension Neg Hx   . Cancer Mother     bladder  . Stroke Brother     No Known Allergies  Current Outpatient Prescriptions on File Prior to Visit  Medication Sig Dispense Refill  . acebutolol (SECTRAL) 400 MG capsule Take 1 capsule (400 mg total) by mouth at bedtime. 90 capsule 1  . amLODipine (NORVASC) 5 MG tablet Take 1 tablet (5 mg total) by mouth every morning. 90 tablet 1  . atorvastatin (LIPITOR) 10 MG tablet Take 1 tablet (10 mg total) by mouth at bedtime. 90 tablet 1  . diclofenac sodium (  VOLTAREN) 1 % GEL Apply 4 g topically 4 (four) times daily. 100 g 1  . gabapentin (NEURONTIN) 100 MG capsule Take 1 capsule (100 mg total) by mouth 3 (three) times daily. 270 capsule 1  . glimepiride (AMARYL) 4 MG tablet Take 0.5 tablets (2 mg total) by mouth every morning. 90 tablet 3  . levothyroxine (SYNTHROID, LEVOTHROID) 88 MCG tablet Take 1 tablet (88 mcg total) by mouth daily. 90 tablet 1  . sitaGLIPtin (JANUVIA) 50 MG tablet Take 1 tablet (50 mg total) by mouth every morning. 90 tablet 1  . telmisartan-hydrochlorothiazide (MICARDIS HCT) 80-12.5 MG tablet Take 1 tablet by mouth every morning. 90 tablet 1  . traMADol (ULTRAM) 50 MG tablet Take 1 tablet (50 mg total) by mouth 2 (two) times daily as needed. (Patient not taking: Reported on 09/04/2016) 30 tablet 0   No current facility-administered medications on file prior to visit.     BP (!) 147/66 (BP Location: Right Arm, Patient Position: Sitting,  Cuff Size: Normal)   Pulse 62   Temp 97.7 F (36.5 C) (Oral)   Resp 16   Ht 5\' 6"  (1.676 m)   Wt 182 lb 9.6 oz (82.8 kg)   SpO2 98% Comment: room air  BMI 29.47 kg/m       Objective:   Physical Exam  Constitutional: She is oriented to person, place, and time. She appears well-developed and well-nourished.  Cardiovascular: Normal rate, regular rhythm and normal heart sounds.   No murmur heard. Pulmonary/Chest: Effort normal and breath sounds normal. No respiratory distress. She has no wheezes.  Musculoskeletal: She exhibits no edema.  Neurological: She is alert and oriented to person, place, and time.  Skin: Skin is warm and dry.  Psychiatric: She has a normal mood and affect. Her behavior is normal. Judgment and thought content normal.          Assessment & Plan:

## 2016-09-04 NOTE — Assessment & Plan Note (Signed)
Stable, continue current meds. Declines eye exam. Had flu shot.

## 2016-09-04 NOTE — Assessment & Plan Note (Signed)
LDL at goal 

## 2016-09-04 NOTE — Assessment & Plan Note (Signed)
Fair control, continue gabapentin.

## 2016-10-15 ENCOUNTER — Encounter: Payer: Self-pay | Admitting: Family

## 2016-10-15 DIAGNOSIS — Z1231 Encounter for screening mammogram for malignant neoplasm of breast: Secondary | ICD-10-CM | POA: Diagnosis not present

## 2016-10-22 ENCOUNTER — Telehealth: Payer: Self-pay | Admitting: *Deleted

## 2016-10-22 NOTE — Telephone Encounter (Signed)
Daughter declined for pt.

## 2016-12-03 ENCOUNTER — Ambulatory Visit (INDEPENDENT_AMBULATORY_CARE_PROVIDER_SITE_OTHER): Payer: Medicare Other | Admitting: Family

## 2016-12-03 ENCOUNTER — Encounter: Payer: Self-pay | Admitting: Family

## 2016-12-03 VITALS — BP 151/78 | HR 66 | Temp 97.6°F | Resp 18 | Ht 66.0 in | Wt 181.0 lb

## 2016-12-03 DIAGNOSIS — E785 Hyperlipidemia, unspecified: Secondary | ICD-10-CM | POA: Diagnosis not present

## 2016-12-03 DIAGNOSIS — E039 Hypothyroidism, unspecified: Secondary | ICD-10-CM | POA: Diagnosis not present

## 2016-12-03 DIAGNOSIS — E1140 Type 2 diabetes mellitus with diabetic neuropathy, unspecified: Secondary | ICD-10-CM | POA: Diagnosis not present

## 2016-12-03 DIAGNOSIS — M25561 Pain in right knee: Secondary | ICD-10-CM | POA: Diagnosis not present

## 2016-12-03 MED ORDER — GLIMEPIRIDE 4 MG PO TABS: 2.0000 mg | ORAL_TABLET | ORAL | 1 refills | Status: DC

## 2016-12-03 NOTE — Progress Notes (Signed)
Subjective:    Patient ID: [name], female    DOB: March 03, 1919, 83 y.o.   MRN: [id number]  HPI   [name] is a 83 yr old female who presents today for follow up.  1) DM2- She is maintained on januvia.  Reports sugars at home are 110-140.   Lab Results  Component Value Date   HGBA1C 6.4 09/04/2016   HGBA1C 6.6 (H) 04/23/2016   HGBA1C 6.6 (H) 01/23/2016   Lab Results  Component Value Date   MICROALBUR 9.9 (H) 04/23/2016   LDLCALC 93 03/12/2016   CREATININE 1.41 (H) 09/04/2016   2) HTN- continues medications. BP Readings from Last 3 Encounters:  12/03/16 (!) 151/78  09/04/16 (!) 147/66  07/05/16 (!) 142/70   3) Hypothyroid- continues synthroid.   Lab Results  Component Value Date   TSH 1.21 09/04/2016   4) Knee pain- notes pain behind her right knee x 2 months with walking.  No improvement with bid tylenol.     Review of Systems See HPI  Past Medical History:  Diagnosis Date  . Anxiety    loss of child 1 year ago  . Cancer ([place])    right breast  . Diabetes mellitus, type 2 ([place])   . Gout   . Hyperlipidemia   . Hypertension    states eccho Dr Mare Ferrari 1 year ago- no record found in [facility] or office records  . Hyperthyroidism      Social History   Social History  . Marital status: Widowed    Spouse name: N/A  . Number of children: N/A  . Years of education: N/A   Occupational History  . Not on file.   Social History Main Topics  . Smoking status: Never Smoker  . Smokeless tobacco: Never Used  . Alcohol use No  . Drug use: No  . Sexual activity: Not on file   Other Topics Concern  . Not on file   Social History Narrative   Daughter helps with pt considerably- she lives across the street from her daughter   Homemaker   Completed high school   2 children, both daughters, one died at age 33   Enjoys spending time outside, yard work.   No pets       Past Surgical History:  Procedure Laterality Date  . ABDOMINAL HYSTERECTOMY  1995  .  BREAST LUMPECTOMY  11/06/2011   Procedure: LUMPECTOMY;  Surgeon: Harl Bowie, MD;  Location: [facility];  Service: General;  Laterality: Right;  . CHOLECYSTECTOMY  1976  . ROTATOR CUFF REPAIR  1999   right    Family History  Problem Relation Age of Onset  . Diabetes Neg Hx   . Hypertension Neg Hx   . Cancer Mother     bladder  . Stroke Brother     No Known Allergies  Current Outpatient Prescriptions on File Prior to Visit  Medication Sig Dispense Refill  . acebutolol (SECTRAL) 400 MG capsule Take 1 capsule (400 mg total) by mouth at bedtime. 90 capsule 1  . amLODipine (NORVASC) 5 MG tablet Take 1 tablet (5 mg total) by mouth every morning. 90 tablet 1  . atorvastatin (LIPITOR) 10 MG tablet Take 1 tablet (10 mg total) by mouth at bedtime. 90 tablet 1  . diclofenac sodium (VOLTAREN) 1 % GEL Apply 4 g topically 4 (four) times daily. 100 g 1  . gabapentin (NEURONTIN) 100 MG capsule Take 1 capsule (100 mg total) by mouth 3 (three)  times daily. 270 capsule 1  . glucose blood test strip Use as instructed to test glucose levels once daily. Dx. E11.9 100 each 1  . levothyroxine (SYNTHROID, LEVOTHROID) 88 MCG tablet Take 1 tablet (88 mcg total) by mouth daily. 90 tablet 1  . sitaGLIPtin (JANUVIA) 50 MG tablet Take 1 tablet (50 mg total) by mouth every morning. 90 tablet 1  . telmisartan-hydrochlorothiazide (MICARDIS HCT) 80-12.5 MG tablet Take 1 tablet by mouth every morning. 90 tablet 1  . traMADol (ULTRAM) 50 MG tablet Take 1 tablet (50 mg total) by mouth 2 (two) times daily as needed. 30 tablet 0   No current facility-administered medications on file prior to visit.     BP (!) 151/78 (BP Location: Right Arm, Cuff Size: Normal)   Pulse 66   Temp 97.6 F (36.4 C) (Oral)   Resp 18   Ht 5\' 6"  (1.676 m)   Wt 181 lb (82.1 kg)   SpO2 99% Comment: room air  BMI 29.21 kg/m       Objective:   Physical Exam  Constitutional: She is oriented to person, place, and time. She appears  well-developed and well-nourished.  Cardiovascular: Normal rate, regular rhythm and normal heart sounds.   No murmur heard. Pulmonary/Chest: Effort normal and breath sounds normal. No respiratory distress. She has no wheezes.  Musculoskeletal:  Mild tenderness to palpation behind the right knee.  No swelling  Neurological: She is alert and oriented to person, place, and time.  Psychiatric: She has a normal mood and affect. Her behavior is normal. Judgment and thought content normal.          Assessment & Plan:  Right knee pain- suspect baker's cyst. Will refer to sports med for further evaluation

## 2016-12-03 NOTE — Assessment & Plan Note (Signed)
Clinically stable on current meds, continue same. She will return after 11/25/16 for lab work.

## 2016-12-03 NOTE — Assessment & Plan Note (Signed)
Clinically stable, continue synthroid.

## 2016-12-03 NOTE — Patient Instructions (Signed)
You will be contacted about your referral to sports medicine.  Please schedule a lab visit at your convenience after 12/06/15.

## 2016-12-03 NOTE — Progress Notes (Signed)
Pre visit review using our clinic review tool, if applicable. No additional management support is needed unless otherwise documented below in the visit note. 

## 2016-12-09 ENCOUNTER — Other Ambulatory Visit (INDEPENDENT_AMBULATORY_CARE_PROVIDER_SITE_OTHER): Payer: Medicare Other

## 2016-12-09 ENCOUNTER — Ambulatory Visit (INDEPENDENT_AMBULATORY_CARE_PROVIDER_SITE_OTHER): Payer: Medicare Other | Admitting: Family Medicine

## 2016-12-09 ENCOUNTER — Encounter: Payer: Self-pay | Admitting: Family Medicine

## 2016-12-09 DIAGNOSIS — E785 Hyperlipidemia, unspecified: Secondary | ICD-10-CM | POA: Diagnosis not present

## 2016-12-09 DIAGNOSIS — E1140 Type 2 diabetes mellitus with diabetic neuropathy, unspecified: Secondary | ICD-10-CM

## 2016-12-09 DIAGNOSIS — M25561 Pain in right knee: Secondary | ICD-10-CM | POA: Diagnosis not present

## 2016-12-09 LAB — LDL CHOLESTEROL, DIRECT: Direct LDL: 72 mg/dL

## 2016-12-09 LAB — LIPID PANEL
CHOL/HDL RATIO: 4
CHOLESTEROL: 154 mg/dL (ref 0–200)
HDL: 42.1 mg/dL (ref >39.00)
NONHDL: 111.52
Triglycerides: 215 mg/dL — ABNORMAL HIGH (ref 0.0–149.0)
VLDL: 43 mg/dL — AB (ref 0.0–40.0)

## 2016-12-09 LAB — BASIC METABOLIC PANEL
BUN: 27 mg/dL — AB (ref 6–23)
CHLORIDE: 102 meq/L (ref 96–112)
CO2: 27 mEq/L (ref 19–32)
Calcium: 9.4 mg/dL (ref 8.4–10.5)
Creatinine, Ser: 1.45 mg/dL — ABNORMAL HIGH (ref 0.40–1.20)
GFR: 35.64 mL/min — AB (ref >60.00)
Glucose, Bld: 141 mg/dL — ABNORMAL HIGH (ref 70–99)
POTASSIUM: 4.2 meq/L (ref 3.5–5.1)
SODIUM: 137 meq/L (ref 135–145)

## 2016-12-09 LAB — HEMOGLOBIN A1C: HEMOGLOBIN A1C: 6.7 % — AB (ref 4.6–6.5)

## 2016-12-09 NOTE — Patient Instructions (Signed)
You have a distal hamstring strain. Do knee curls, knee swings 3 sets of 10 once a day (start with just 1 set of 10 once a day and work your way up). Add very light ankle weight if these become too easy. Tylenol 500mg  1-2 tabs up to three times a day for pain. biofreeze up to 4 times a day topically also. Knee sleeve during the day when up and walking around. Follow up with me in 1 month. As we discussed you do have evidence of fluid in the joint from arthritis - this can cause discomfort behind the knee as well - draining and injecting the knee, physical therapy are options if you're not improving as expected.

## 2016-12-10 DIAGNOSIS — M25561 Pain in right knee: Secondary | ICD-10-CM | POA: Insufficient documentation

## 2016-12-10 NOTE — Progress Notes (Signed)
PCP: Nance Pear., NP  Subjective:   HPI: Patient is a 83 y.o. female here for right knee pain.  Patient reports about 2 months ago she started to get pain in back of right knee after raking leaves. No acute injury or trauma. Not noticed any swelling or bruising. Sometimes bothers her when lying down but mostly a problem with walking. Pain currently 0/10 - typically dull, nagging when up and around. Using biofreeze. No skin changes, numbness.  Past Medical History:  Diagnosis Date  . Anxiety    loss of child 1 year ago  . Cancer ([place])    right breast  . Diabetes mellitus, type 2 ([place])   . Gout   . Hyperlipidemia   . Hypertension    states eccho Dr Mare Ferrari 1 year ago- no record found in [facility] or office records  . Hyperthyroidism     Current Outpatient Prescriptions on File Prior to Visit  Medication Sig Dispense Refill  . acebutolol (SECTRAL) 400 MG capsule Take 1 capsule (400 mg total) by mouth at bedtime. 90 capsule 1  . amLODipine (NORVASC) 5 MG tablet Take 1 tablet (5 mg total) by mouth every morning. 90 tablet 1  . atorvastatin (LIPITOR) 10 MG tablet Take 1 tablet (10 mg total) by mouth at bedtime. 90 tablet 1  . diclofenac sodium (VOLTAREN) 1 % GEL Apply 4 g topically 4 (four) times daily. 100 g 1  . gabapentin (NEURONTIN) 100 MG capsule Take 1 capsule (100 mg total) by mouth 3 (three) times daily. 270 capsule 1  . glimepiride (AMARYL) 4 MG tablet Take 0.5 tablets (2 mg total) by mouth every morning. 90 tablet 1  . glucose blood test strip Use as instructed to test glucose levels once daily. Dx. E11.9 100 each 1  . levothyroxine (SYNTHROID, LEVOTHROID) 88 MCG tablet Take 1 tablet (88 mcg total) by mouth daily. 90 tablet 1  . sitaGLIPtin (JANUVIA) 50 MG tablet Take 1 tablet (50 mg total) by mouth every morning. 90 tablet 1  . telmisartan-hydrochlorothiazide (MICARDIS HCT) 80-12.5 MG tablet Take 1 tablet by mouth every morning. 90 tablet 1  . traMADol (ULTRAM) 50  MG tablet Take 1 tablet (50 mg total) by mouth 2 (two) times daily as needed. 30 tablet 0   No current facility-administered medications on file prior to visit.     Past Surgical History:  Procedure Laterality Date  . ABDOMINAL HYSTERECTOMY  1995  . BREAST LUMPECTOMY  11/06/2011   Procedure: LUMPECTOMY;  Surgeon: Harl Bowie, MD;  Location: [facility];  Service: General;  Laterality: Right;  . CHOLECYSTECTOMY  1976  . ROTATOR CUFF REPAIR  1999   right    No Known Allergies  Social History   Social History  . Marital status: Widowed    Spouse name: N/A  . Number of children: N/A  . Years of education: N/A   Occupational History  . Not on file.   Social History Main Topics  . Smoking status: Never Smoker  . Smokeless tobacco: Never Used  . Alcohol use No  . Drug use: No  . Sexual activity: Not on file   Other Topics Concern  . Not on file   Social History Narrative   Daughter helps with pt considerably- she lives across the street from her daughter   Homemaker   Completed high school   2 children, both daughters, one died at age 15   Enjoys spending time outside, yard work.   No pets  Family History  Problem Relation Age of Onset  . Diabetes Neg Hx   . Hypertension Neg Hx   . Cancer Mother     bladder  . Stroke Brother     BP 135/74   Pulse 64   Ht 5\' 7"  (1.702 m)   Wt 180 lb (81.6 kg)   BMI 28.19 kg/m   Review of Systems: See HPI above.     Objective:  Physical Exam:  Gen: NAD, comfortable in exam room  Right knee: Mod effusion.  No other gross deformity, ecchymoses. TTP medial hamstring, less medial joint line.  No other tenderness. FROM with 5/5 strength. Negative ant/post drawers. Negative valgus/varus testing. Negative lachmanns. Negative mcmurrays, apleys, patellar apprehension. NV intact distally.  Left knee: FROM without pain.   Assessment & Plan:  1. Right knee pain - current history, exam suggests overuse hamstring  strain as primary issue.  She does have evidence of an effusion, medial joint line tenderness to suggest arthritis as well.  Shown home exercises to do daily.  Tylenol, biofreeze.  Knee sleeve provided for support and compression also.  F/u in 1 month.  Can consider aspiration/injection, PT in future depending on how she's doing.

## 2016-12-10 NOTE — Assessment & Plan Note (Signed)
current history, exam suggests overuse hamstring strain as primary issue.  She does have evidence of an effusion, medial joint line tenderness to suggest arthritis as well.  Shown home exercises to do daily.  Tylenol, biofreeze.  Knee sleeve provided for support and compression also.  F/u in 1 month.  Can consider aspiration/injection, PT in future depending on how she's doing.

## 2017-01-09 ENCOUNTER — Ambulatory Visit: Payer: Self-pay | Admitting: Family Medicine

## 2017-01-13 ENCOUNTER — Encounter: Payer: Self-pay | Admitting: Family

## 2017-01-14 MED ORDER — GLIMEPIRIDE 2 MG PO TABS
2.0000 mg | ORAL_TABLET | Freq: Every day | ORAL | 1 refills | Status: DC
Start: 2017-01-14 — End: 2017-03-19

## 2017-01-15 ENCOUNTER — Telehealth: Payer: Self-pay | Admitting: Family

## 2017-01-15 NOTE — Telephone Encounter (Addendum)
12/20/14 PR PPPS, SUBSEQ VISIT [[id number]] Patient scheduled AWV with Angel for 04/08/17 at 10am and follow up with PCP at 10:30am

## 2017-02-04 ENCOUNTER — Encounter: Payer: Self-pay | Admitting: Family

## 2017-02-04 MED ORDER — GLUCOSE BLOOD VI STRP: ORAL_STRIP | 1 refills | Status: DC

## 2017-02-04 MED ORDER — AMLODIPINE BESYLATE 5 MG PO TABS: 5.0000 mg | ORAL_TABLET | ORAL | 1 refills | Status: DC

## 2017-02-04 MED ORDER — ACEBUTOLOL HCL 400 MG PO CAPS: 400.0000 mg | ORAL_CAPSULE | Freq: Every day | ORAL | 1 refills | Status: DC

## 2017-02-04 MED ORDER — TELMISARTAN-HCTZ 80-12.5 MG PO TABS: 1.0000 | ORAL_TABLET | ORAL | 1 refills | Status: DC

## 2017-02-05 ENCOUNTER — Encounter: Payer: Self-pay | Admitting: Family

## 2017-02-05 MED ORDER — LEVOTHYROXINE SODIUM 88 MCG PO TABS: 88.0000 ug | ORAL_TABLET | Freq: Every day | ORAL | 1 refills | Status: DC

## 2017-02-05 MED ORDER — SITAGLIPTIN PHOSPHATE 50 MG PO TABS: 50.0000 mg | ORAL_TABLET | ORAL | 1 refills | Status: DC

## 2017-02-05 MED ORDER — TRUEPLUS LANCETS 33G MISC: 1 refills | Status: DC

## 2017-02-05 MED ORDER — ATORVASTATIN CALCIUM 10 MG PO TABS: 10.0000 mg | ORAL_TABLET | Freq: Every day | ORAL | 1 refills | Status: DC

## 2017-02-14 ENCOUNTER — Encounter: Payer: Self-pay | Admitting: Family

## 2017-02-17 ENCOUNTER — Other Ambulatory Visit: Payer: Self-pay

## 2017-02-17 MED ORDER — ACEBUTOLOL HCL 400 MG PO CAPS
400.0000 mg | ORAL_CAPSULE | Freq: Every day | ORAL | 0 refills | Status: DC
Start: 2017-02-17 — End: 2017-07-08

## 2017-02-17 MED ORDER — AMLODIPINE BESYLATE 5 MG PO TABS: 5.0000 mg | ORAL_TABLET | ORAL | 0 refills | Status: DC

## 2017-02-17 MED ORDER — ACEBUTOLOL HCL 400 MG PO CAPS: 400.0000 mg | ORAL_CAPSULE | Freq: Every day | ORAL | 0 refills | Status: DC

## 2017-02-17 NOTE — Telephone Encounter (Signed)
Pt out of meds,  Per [facility] the orders were called in and should have shipped but they have not received via mail order. They are wondering if they need to get a few days from retail pharmacy or if pt can go without for a few days. Please call.  [name], daughter Ph# [phone]

## 2017-03-18 ENCOUNTER — Encounter: Payer: Self-pay | Admitting: Family

## 2017-03-19 MED ORDER — GLIMEPIRIDE 2 MG PO TABS: 2.0000 mg | ORAL_TABLET | Freq: Every day | ORAL | 1 refills | Status: DC

## 2017-03-19 NOTE — Telephone Encounter (Signed)
Please contact patient and let her know that she should only take amaryl 2mg  (one tab) daily. I am afraid that 2 tabs will be too high for her.  As long as her sugars are <250, I am not concerned. If her sugars start running higher than that please let me know. thanks

## 2017-03-21 ENCOUNTER — Encounter: Payer: Self-pay | Admitting: Family

## 2017-03-25 MED ORDER — GLIMEPIRIDE 2 MG PO TABS: 2.0000 mg | ORAL_TABLET | Freq: Every day | ORAL | 0 refills | Status: DC

## 2017-03-25 NOTE — Telephone Encounter (Signed)
Spoke with pt's daughter. States pt is hard of hearing over the phone and she will relay below information to her. Also requests 7 day supply of glimepiride go to [facility] to last pt until she receives mail order.

## 2017-04-07 NOTE — Progress Notes (Deleted)
Subjective:   [name] is a 83 y.o. female who presents for Medicare Annual (Subsequent) preventive examination.  Review of Systems:  ROS-No ROS.  Medicare Wellness Visit.    Sleep patterns:    Home Safety/Smoke Alarms:   Living environment; residence and Firearm Safety:  Seat Belt Safety/Bike Helmet: Wears seat belt.   Counseling:   Eye Exam-  Dental-  Female:   Pap-  No longer due to age 26-  Last 10/15/16: BI-RADS Category 2:Benign    Dexa scan- declines       CCS-n/a due to age     Objective:     Vitals: There were no vitals taken for this visit.  There is no height or weight on file to calculate BMI.   Tobacco History  Smoking Status  . Never Smoker  Smokeless Tobacco  . Never Used     Counseling given: Not Answered   Past Medical History:  Diagnosis Date  . Anxiety    loss of child 1 year ago  . Cancer ([place])    right breast  . Diabetes mellitus, type 2 ([place])   . Gout   . Hyperlipidemia   . Hypertension    states eccho Dr Mare Ferrari 1 year ago- no record found in [facility] or office records  . Hyperthyroidism    Past Surgical History:  Procedure Laterality Date  . ABDOMINAL HYSTERECTOMY  1995  . BREAST LUMPECTOMY  11/06/2011   Procedure: LUMPECTOMY;  Surgeon: Harl Bowie, MD;  Location: [facility];  Service: General;  Laterality: Right;  . CHOLECYSTECTOMY  1976  . ROTATOR CUFF REPAIR  1999   right   Family History  Problem Relation Age of Onset  . Diabetes Neg Hx   . Hypertension Neg Hx   . Cancer Mother        bladder  . Stroke Brother    History  Sexual Activity  . Sexual activity: Not on file    Outpatient Encounter Prescriptions as of 04/08/2017  Medication Sig  . acebutolol (SECTRAL) 400 MG capsule Take 1 capsule (400 mg total) by mouth at bedtime.  [name] amLODipine (NORVASC) 5 MG tablet Take 1 tablet (5 mg total) by mouth every morning.  [name] atorvastatin (LIPITOR) 10 MG tablet Take 1 tablet (10 mg total) by mouth at bedtime.    . diclofenac sodium (VOLTAREN) 1 % GEL Apply 4 g topically 4 (four) times daily.  [name] gabapentin (NEURONTIN) 100 MG capsule Take 1 capsule (100 mg total) by mouth 3 (three) times daily.  [name] glimepiride (AMARYL) 2 MG tablet Take 1 tablet (2 mg total) by mouth daily before breakfast.  . glucose blood (TRUETRACK TEST) test strip Use as instructed to check blood sugar once a day.  Dx E11.9  . levothyroxine (SYNTHROID, LEVOTHROID) 88 MCG tablet Take 1 tablet (88 mcg total) by mouth daily.  . sitaGLIPtin (JANUVIA) 50 MG tablet Take 1 tablet (50 mg total) by mouth every morning.  [name] telmisartan-hydrochlorothiazide (MICARDIS HCT) 80-12.5 MG tablet Take 1 tablet by mouth every morning.  . traMADol (ULTRAM) 50 MG tablet Take 1 tablet (50 mg total) by mouth 2 (two) times daily as needed.  . TRUEPLUS LANCETS 33G MISC Use to check blood sugar once a day. Dx E11.9   No facility-administered encounter medications on file as of 04/08/2017.     Activities of Daily Living No flowsheet data found.  Patient Care Team: Debbrah Alar, NP as PCP - General (Internal Medicine)    Assessment:  Physical assessment deferred to PCP.  Exercise Activities and Dietary recommendations   Diet (meal preparation, eat out, water intake, caffeinated beverages, dairy products, fruits and vegetables): {Desc; diets:16563} Breakfast: Lunch:  Dinner:      Goals    None     Fall Risk Fall Risk  01/23/2016 12/20/2014 12/16/2013 12/16/2012  Falls in the past year? No No No No   Depression Screen PHQ 2/9 Scores 01/23/2016 12/20/2014 12/16/2013 12/16/2012  PHQ - 2 Score 0 0 0 0     Cognitive Function        Immunization History  Administered Date(s) Administered  . Influenza Split 09/17/2012  . Influenza Whole 07/19/2013  . Influenza, High Dose Seasonal PF 08/30/2015  . Influenza,inj,Quad PF,36+ Mos 08/24/2014  . Influenza-Unspecified 08/28/2016  . Pneumococcal Conjugate-13 12/20/2014  . Pneumococcal  Polysaccharide-23 12/16/2013  . Zoster 11/19/2011   Screening Tests Health Maintenance  Topic Date Due  . TETANUS/TDAP  08/21/1940  . OPHTHALMOLOGY EXAM  08/13/2015  . DEXA SCAN  09/04/2017 (Originally 08/21/1986)  . HEMOGLOBIN A1C  06/08/2017  . INFLUENZA VACCINE  06/18/2017  . FOOT EXAM  09/04/2017  . MAMMOGRAM  10/15/2017  . PNA vac Low Risk Adult  Completed      Plan:   ***   I have personally reviewed and noted the following in the patient's chart:   . Medical and social history . Use of alcohol, tobacco or illicit drugs  . Current medications and supplements . Functional ability and status . Nutritional status . Physical activity . Advanced directives . List of other physicians . Hospitalizations, surgeries, and ER visits in previous 12 months . Vitals . Screenings to include cognitive, depression, and falls . Referrals and appointments  In addition, I have reviewed and discussed with patient certain preventive protocols, quality metrics, and best practice recommendations. A written personalized care plan for preventive services as well as general preventive health recommendations were provided to patient.     Shela Nevin, [place]  04/07/2017

## 2017-04-08 ENCOUNTER — Ambulatory Visit (INDEPENDENT_AMBULATORY_CARE_PROVIDER_SITE_OTHER): Payer: Medicare Other | Admitting: Family

## 2017-04-08 ENCOUNTER — Encounter: Payer: Self-pay | Admitting: Family

## 2017-04-08 VITALS — BP 144/65 | HR 60 | Temp 98.0°F | Resp 16 | Ht 67.0 in | Wt 185.6 lb

## 2017-04-08 DIAGNOSIS — E039 Hypothyroidism, unspecified: Secondary | ICD-10-CM | POA: Diagnosis not present

## 2017-04-08 DIAGNOSIS — I10 Essential (primary) hypertension: Secondary | ICD-10-CM | POA: Diagnosis not present

## 2017-04-08 DIAGNOSIS — E1140 Type 2 diabetes mellitus with diabetic neuropathy, unspecified: Secondary | ICD-10-CM

## 2017-04-08 DIAGNOSIS — G6289 Other specified polyneuropathies: Secondary | ICD-10-CM

## 2017-04-08 DIAGNOSIS — E785 Hyperlipidemia, unspecified: Secondary | ICD-10-CM | POA: Diagnosis not present

## 2017-04-08 LAB — BASIC METABOLIC PANEL
BUN: 36 mg/dL — ABNORMAL HIGH (ref 6–23)
CHLORIDE: 105 meq/L (ref 96–112)
CO2: 26 mEq/L (ref 19–32)
CREATININE: 1.4 mg/dL — AB (ref 0.40–1.20)
Calcium: 9.7 mg/dL (ref 8.4–10.5)
GFR: 37.09 mL/min — ABNORMAL LOW (ref >60.00)
Glucose, Bld: 134 mg/dL — ABNORMAL HIGH (ref 70–99)
Potassium: 4.9 mEq/L (ref 3.5–5.1)
Sodium: 140 mEq/L (ref 135–145)

## 2017-04-08 LAB — LIPID PANEL
Cholesterol: 168 mg/dL (ref 0–200)
HDL: 39.7 mg/dL (ref >39.00)
NonHDL: 128.62
Total CHOL/HDL Ratio: 4
Triglycerides: 328 mg/dL — ABNORMAL HIGH (ref 0.0–149.0)
VLDL: 65.6 mg/dL — AB (ref 0.0–40.0)

## 2017-04-08 LAB — HEMOGLOBIN A1C: HEMOGLOBIN A1C: 6.9 % — AB (ref 4.6–6.5)

## 2017-04-08 LAB — LDL CHOLESTEROL, DIRECT: LDL DIRECT: 76 mg/dL

## 2017-04-08 LAB — TSH: TSH: 1.41 u[IU]/mL (ref 0.35–4.50)

## 2017-04-08 MED ORDER — GABAPENTIN 100 MG PO CAPS: 200.0000 mg | ORAL_CAPSULE | Freq: Three times a day (TID) | ORAL | 5 refills | Status: DC

## 2017-04-08 NOTE — Progress Notes (Signed)
Subjective:    Patient ID: [name], female    DOB: 1919/08/06, 83 y.o.   MRN: [id number]  HPI  [name] is a 83 yr old female who presents today for follow up.  1) DM2- maintained on januvia 50mg  and amaryl 2mg  once daily.  Denies hypoglycemia. Notes fasting sugars 150-170. Lab Results  Component Value Date   HGBA1C 6.7 (H) 12/09/2016   HGBA1C 6.4 09/04/2016   HGBA1C 6.6 (H) 04/23/2016   Lab Results  Component Value Date   MICROALBUR 9.9 (H) 04/23/2016   LDLCALC 93 03/12/2016   CREATININE 1.45 (H) 12/09/2016   2) HTN-  Maintained on amlodipine and micardis hct.   BP Readings from Last 3 Encounters:  04/08/17 (!) 144/65  12/09/16 135/74  12/03/16 (!) 151/78   3) Hypothyroid- continues synthroid.  Feels well on this dose.  Lab Results  Component Value Date   TSH 1.21 09/04/2016   4) peripheral neuropathy- notes bilateral LE pain with walking.  She takes tylenol bid without.    5) hyperlipidemia- maintained on atorvastatin.  Lab Results  Component Value Date   CHOL 154 12/09/2016   HDL 42.10 12/09/2016   LDLCALC 93 03/12/2016   LDLDIRECT 72.0 12/09/2016   TRIG 215.0 (H) 12/09/2016   CHOLHDL 4 12/09/2016   Review of Systems    see HPI  Past Medical History:  Diagnosis Date  . Anxiety    loss of child 1 year ago  . Cancer ([place])    right breast  . Diabetes mellitus, type 2 ([place])   . Gout   . Hyperlipidemia   . Hypertension    states eccho Dr Mare Ferrari 1 year ago- no record found in [facility] or office records  . Hyperthyroidism      Social History   Social History  . Marital status: Widowed    Spouse name: N/A  . Number of children: N/A  . Years of education: N/A   Occupational History  . Not on file.   Social History Main Topics  . Smoking status: Never Smoker  . Smokeless tobacco: Never Used  . Alcohol use No  . Drug use: No  . Sexual activity: Not on file   Other Topics Concern  . Not on file   Social History Narrative   Daughter  helps with pt considerably- she lives across the street from her daughter   Homemaker   Completed high school   2 children, both daughters, one died at age 57   Enjoys spending time outside, yard work.   No pets       Past Surgical History:  Procedure Laterality Date  . ABDOMINAL HYSTERECTOMY  1995  . BREAST LUMPECTOMY  11/06/2011   Procedure: LUMPECTOMY;  Surgeon: Harl Bowie, MD;  Location: [facility];  Service: General;  Laterality: Right;  . CHOLECYSTECTOMY  1976  . ROTATOR CUFF REPAIR  1999   right    Family History  Problem Relation Age of Onset  . Diabetes Neg Hx   . Hypertension Neg Hx   . Cancer Mother        bladder  . Stroke Brother     No Known Allergies  Current Outpatient Prescriptions on File Prior to Visit  Medication Sig Dispense Refill  . acebutolol (SECTRAL) 400 MG capsule Take 1 capsule (400 mg total) by mouth at bedtime. 15 capsule 0  . amLODipine (NORVASC) 5 MG tablet Take 1 tablet (5 mg total) by mouth every morning. 15 tablet  0  . atorvastatin (LIPITOR) 10 MG tablet Take 1 tablet (10 mg total) by mouth at bedtime. 90 tablet 1  . diclofenac sodium (VOLTAREN) 1 % GEL Apply 4 g topically 4 (four) times daily. 100 g 1  . glimepiride (AMARYL) 2 MG tablet Take 1 tablet (2 mg total) by mouth daily before breakfast. 7 tablet 0  . glucose blood (TRUETRACK TEST) test strip Use as instructed to check blood sugar once a day.  Dx E11.9 100 each 1  . levothyroxine (SYNTHROID, LEVOTHROID) 88 MCG tablet Take 1 tablet (88 mcg total) by mouth daily. 90 tablet 1  . sitaGLIPtin (JANUVIA) 50 MG tablet Take 1 tablet (50 mg total) by mouth every morning. 90 tablet 1  . telmisartan-hydrochlorothiazide (MICARDIS HCT) 80-12.5 MG tablet Take 1 tablet by mouth every morning. 90 tablet 1  . TRUEPLUS LANCETS 33G MISC Use to check blood sugar once a day. Dx E11.9 100 each 1   No current facility-administered medications on file prior to visit.     BP (!) 144/65 (BP Location:  Left Arm, Cuff Size: Normal)   Pulse 60   Temp 98 F (36.7 C) (Oral)   Resp 16   Ht 5\' 7"  (1.702 m)   Wt 185 lb 9.6 oz (84.2 kg)   SpO2 99%   BMI 29.07 kg/m    Objective:   Physical Exam  Constitutional: She is oriented to person, place, and time. She appears well-developed and well-nourished.  Cardiovascular: Normal rate, regular rhythm and normal heart sounds.   No murmur heard. Pulmonary/Chest: Effort normal and breath sounds normal. No respiratory distress. She has no wheezes.  Musculoskeletal:  1+ bilateral LE edema  Neurological: She is alert and oriented to person, place, and time.  Psychiatric: She has a normal mood and affect. Her behavior is normal. Judgment and thought content normal.          Assessment & Plan:

## 2017-04-08 NOTE — Assessment & Plan Note (Signed)
Stable on statin.  Continue same, obtain follow up lipid panel.

## 2017-04-08 NOTE — Assessment & Plan Note (Signed)
Clinically table. Obtain TSH. Continue synthroid.

## 2017-04-08 NOTE — Assessment & Plan Note (Signed)
Stable on current meds.  Continue same. 

## 2017-04-08 NOTE — Assessment & Plan Note (Signed)
Last A1C at goal. Continue current meds. Obtain follow up A1C.

## 2017-04-08 NOTE — Patient Instructions (Addendum)
Please complete lab work prior to leaving. Increase gabapentin to 200mg  (2 caps) three times daily for your neuropathy.

## 2017-04-08 NOTE — Assessment & Plan Note (Signed)
Uncontrolled. Will try increasing gabapentin from 100mg  tid to 200mg  tid.

## 2017-04-10 ENCOUNTER — Encounter: Payer: Self-pay | Admitting: Family

## 2017-07-08 ENCOUNTER — Ambulatory Visit (INDEPENDENT_AMBULATORY_CARE_PROVIDER_SITE_OTHER): Payer: Medicare Other | Admitting: Family

## 2017-07-08 ENCOUNTER — Encounter: Payer: Self-pay | Admitting: Family

## 2017-07-08 VITALS — BP 143/64 | HR 64 | Temp 98.1°F | Ht 67.0 in | Wt 190.0 lb

## 2017-07-08 DIAGNOSIS — E039 Hypothyroidism, unspecified: Secondary | ICD-10-CM | POA: Diagnosis not present

## 2017-07-08 DIAGNOSIS — E785 Hyperlipidemia, unspecified: Secondary | ICD-10-CM | POA: Diagnosis not present

## 2017-07-08 DIAGNOSIS — I10 Essential (primary) hypertension: Secondary | ICD-10-CM

## 2017-07-08 DIAGNOSIS — E118 Type 2 diabetes mellitus with unspecified complications: Secondary | ICD-10-CM | POA: Diagnosis not present

## 2017-07-08 MED ORDER — ATORVASTATIN CALCIUM 10 MG PO TABS: 10.0000 mg | ORAL_TABLET | Freq: Every day | ORAL | 1 refills | Status: DC

## 2017-07-08 MED ORDER — TELMISARTAN-HCTZ 80-12.5 MG PO TABS: 1.0000 | ORAL_TABLET | ORAL | 1 refills | Status: DC

## 2017-07-08 MED ORDER — LEVOTHYROXINE SODIUM 88 MCG PO TABS: 88.0000 ug | ORAL_TABLET | Freq: Every day | ORAL | 1 refills | Status: DC

## 2017-07-08 MED ORDER — GLIMEPIRIDE 2 MG PO TABS: 2.0000 mg | ORAL_TABLET | Freq: Every day | ORAL | 1 refills | Status: DC

## 2017-07-08 MED ORDER — AMLODIPINE BESYLATE 5 MG PO TABS: 5.0000 mg | ORAL_TABLET | ORAL | 1 refills | Status: DC

## 2017-07-08 MED ORDER — SITAGLIPTIN PHOSPHATE 50 MG PO TABS
50.0000 mg | ORAL_TABLET | ORAL | 1 refills | Status: DC
Start: 2017-07-08 — End: 2018-01-07

## 2017-07-08 MED ORDER — ACEBUTOLOL HCL 400 MG PO CAPS: 400.0000 mg | ORAL_CAPSULE | Freq: Every day | ORAL | 1 refills | Status: DC

## 2017-07-08 NOTE — Patient Instructions (Signed)
Please complete lab work prior to leaving.   

## 2017-07-08 NOTE — Progress Notes (Signed)
Subjective:    Patient ID: [name], female    DOB: 1919-05-26, 83 y.o.   MRN: [id number]  HPI  [name] is a 83 yr old female who presents today for follow up.  1) Hyperlipidemia- maintained on lipitor 10mg   Once daily. Denies difficulty with lipitor. Denies myalgia.   Lab Results  Component Value Date   CHOL 168 04/08/2017   HDL 39.70 04/08/2017   LDLCALC 93 03/12/2016   LDLDIRECT 76.0 04/08/2017   TRIG 328.0 (H) 04/08/2017   CHOLHDL 4 04/08/2017   2) DM2- reports sugar today was 150.  Usually runs 140-150.  On amaryl and [place].  She denies any hypoglycemia.   Lab Results  Component Value Date   HGBA1C 6.9 (H) 04/08/2017   HGBA1C 6.7 (H) 12/09/2016   HGBA1C 6.4 09/04/2016   Lab Results  Component Value Date   MICROALBUR 9.9 (H) 04/23/2016   LDLCALC 93 03/12/2016   CREATININE 1.40 (H) 04/08/2017   3) Hypothyroid- maintained on synthroid.   Lab Results  Component Value Date   TSH 1.41 04/08/2017      Review of Systems See HPI  Past Medical History:  Diagnosis Date  . Anxiety    loss of child 1 year ago  . Cancer ([place])    right breast  . Diabetes mellitus, type 2 ([place])   . Gout   . Hyperlipidemia   . Hypertension    states eccho Dr Mare Ferrari 1 year ago- no record found in [facility] or office records  . Hyperthyroidism      Social History   Social History  . Marital status: Widowed    Spouse name: N/A  . Number of children: N/A  . Years of education: N/A   Occupational History  . Not on file.   Social History Main Topics  . Smoking status: Never Smoker  . Smokeless tobacco: Never Used  . Alcohol use No  . Drug use: No  . Sexual activity: Not on file   Other Topics Concern  . Not on file   Social History Narrative   Daughter helps with pt considerably- she lives across the street from her daughter   Homemaker   Completed high school   2 children, both daughters, one died at age 64   Enjoys spending time outside, yard work.   No pets         Past Surgical History:  Procedure Laterality Date  . ABDOMINAL HYSTERECTOMY  1995  . BREAST LUMPECTOMY  11/06/2011   Procedure: LUMPECTOMY;  Surgeon: Harl Bowie, MD;  Location: [facility];  Service: General;  Laterality: Right;  . CHOLECYSTECTOMY  1976  . ROTATOR CUFF REPAIR  1999   right    Family History  Problem Relation Age of Onset  . Diabetes Neg Hx   . Hypertension Neg Hx   . Cancer Mother        bladder  . Stroke Brother     No Known Allergies  Current Outpatient Prescriptions on File Prior to Visit  Medication Sig Dispense Refill  . acebutolol (SECTRAL) 400 MG capsule Take 1 capsule (400 mg total) by mouth at bedtime. 15 capsule 0  . amLODipine (NORVASC) 5 MG tablet Take 1 tablet (5 mg total) by mouth every morning. 15 tablet 0  . atorvastatin (LIPITOR) 10 MG tablet Take 1 tablet (10 mg total) by mouth at bedtime. 90 tablet 1  . diclofenac sodium (VOLTAREN) 1 % GEL Apply 4 g topically 4 (four)  times daily. 100 g 1  . gabapentin (NEURONTIN) 100 MG capsule Take 2 capsules (200 mg total) by mouth 3 (three) times daily. 180 capsule 5  . glimepiride (AMARYL) 2 MG tablet Take 1 tablet (2 mg total) by mouth daily before breakfast. 7 tablet 0  . glucose blood (TRUETRACK TEST) test strip Use as instructed to check blood sugar once a day.  Dx E11.9 100 each 1  . levothyroxine (SYNTHROID, LEVOTHROID) 88 MCG tablet Take 1 tablet (88 mcg total) by mouth daily. 90 tablet 1  . sitaGLIPtin (JANUVIA) 50 MG tablet Take 1 tablet (50 mg total) by mouth every morning. 90 tablet 1  . telmisartan-hydrochlorothiazide (MICARDIS HCT) 80-12.5 MG tablet Take 1 tablet by mouth every morning. 90 tablet 1  . TRUEPLUS LANCETS 33G MISC Use to check blood sugar once a day. Dx E11.9 100 each 1   No current facility-administered medications on file prior to visit.     BP (!) 143/64   Pulse 64   Temp 98.1 F (36.7 C) (Oral)   Ht 5\' 7"  (1.702 m)   Wt 190 lb (86.2 kg)   BMI 29.76 kg/m        Objective:   Physical Exam  Constitutional: She appears well-developed and well-nourished.  Cardiovascular: Normal rate, regular rhythm and normal heart sounds.   No murmur heard. Pulmonary/Chest: Effort normal and breath sounds normal. No respiratory distress. She has no wheezes.  Musculoskeletal:  1+ bilateral LE edema  Psychiatric: She has a normal mood and affect. Her behavior is normal. Judgment and thought content normal.          Assessment & Plan:  Hyperlipidemia-tolerating statin. Lipid stable continue same.  Hypertension-blood pressure is stable continue current meds. Obtain follow-up basic metabolic panel  Diabetes type 2-blood sugars are stable. Will obtain follow-up A1c.  Hypothyroid-clinically stable on current dose of Synthroid. Obtain follow-up TSH continue current dose.

## 2017-07-09 LAB — BASIC METABOLIC PANEL
BUN: 32 mg/dL — AB (ref 6–23)
CHLORIDE: 102 meq/L (ref 96–112)
CO2: 29 meq/L (ref 19–32)
Calcium: 10 mg/dL (ref 8.4–10.5)
Creatinine, Ser: 1.64 mg/dL — ABNORMAL HIGH (ref 0.40–1.20)
GFR: 30.88 mL/min — ABNORMAL LOW (ref >60.00)
Glucose, Bld: 119 mg/dL — ABNORMAL HIGH (ref 70–99)
POTASSIUM: 4.9 meq/L (ref 3.5–5.1)
Sodium: 139 mEq/L (ref 135–145)

## 2017-07-09 LAB — TSH: TSH: 1.34 u[IU]/mL (ref 0.35–4.50)

## 2017-07-09 LAB — HEMOGLOBIN A1C
HEMOGLOBIN A1C: 6.5 % — AB (ref <5.7)
Mean Plasma Glucose: 140 mg/dL

## 2017-07-10 ENCOUNTER — Encounter: Payer: Self-pay | Admitting: Family

## 2017-07-19 ENCOUNTER — Encounter: Payer: Self-pay | Admitting: Family

## 2017-07-22 MED ORDER — GLUCOSE BLOOD VI STRP: ORAL_STRIP | 1 refills | Status: DC

## 2017-07-22 MED ORDER — TRUEPLUS LANCETS 33G MISC: 1 refills | Status: DC

## 2017-08-20 DIAGNOSIS — Z23 Encounter for immunization: Secondary | ICD-10-CM | POA: Diagnosis not present

## 2017-09-11 ENCOUNTER — Ambulatory Visit (INDEPENDENT_AMBULATORY_CARE_PROVIDER_SITE_OTHER): Payer: Medicare Other | Admitting: Podiatry

## 2017-09-11 ENCOUNTER — Encounter: Payer: Self-pay | Admitting: Podiatry

## 2017-09-11 DIAGNOSIS — B351 Tinea unguium: Secondary | ICD-10-CM | POA: Diagnosis not present

## 2017-09-11 DIAGNOSIS — M79675 Pain in left toe(s): Secondary | ICD-10-CM

## 2017-09-11 DIAGNOSIS — M79674 Pain in right toe(s): Secondary | ICD-10-CM | POA: Diagnosis not present

## 2017-09-11 NOTE — Progress Notes (Signed)
Subjective:    Patient ID: [name], female   DOB: 83 y.o.   MRN: [id number]   HPI patient presents with elongated nailbeds 1-5 both feet that are bothersome    ROS      Objective:  Physical Exam neurovascular status intact with thick yellow brittle nailbeds 1-5 both feet that are painful and hard for her to cut     Assessment:    Mycotic nail infection 1-5 both feet     Plan:    Debride painful nailbeds 1-5 both feet with no iatrogenic bleeding noted

## 2017-10-07 ENCOUNTER — Encounter: Payer: Self-pay | Admitting: Family

## 2017-10-07 ENCOUNTER — Ambulatory Visit (INDEPENDENT_AMBULATORY_CARE_PROVIDER_SITE_OTHER): Payer: Medicare Other | Admitting: Family

## 2017-10-07 VITALS — BP 142/50 | HR 64 | Temp 98.0°F | Resp 16 | Ht 67.0 in | Wt 186.0 lb

## 2017-10-07 DIAGNOSIS — E785 Hyperlipidemia, unspecified: Secondary | ICD-10-CM

## 2017-10-07 DIAGNOSIS — E1142 Type 2 diabetes mellitus with diabetic polyneuropathy: Secondary | ICD-10-CM

## 2017-10-07 DIAGNOSIS — E039 Hypothyroidism, unspecified: Secondary | ICD-10-CM

## 2017-10-07 DIAGNOSIS — I10 Essential (primary) hypertension: Secondary | ICD-10-CM | POA: Diagnosis not present

## 2017-10-07 LAB — LIPID PANEL
CHOLESTEROL: 158 mg/dL (ref 0–200)
HDL: 38.5 mg/dL — AB (ref >39.00)
NONHDL: 119.14
TRIGLYCERIDES: 283 mg/dL — AB (ref 0.0–149.0)
Total CHOL/HDL Ratio: 4
VLDL: 56.6 mg/dL — ABNORMAL HIGH (ref 0.0–40.0)

## 2017-10-07 LAB — BASIC METABOLIC PANEL
BUN: 34 mg/dL — AB (ref 6–23)
CO2: 28 mEq/L (ref 19–32)
CREATININE: 1.46 mg/dL — AB (ref 0.40–1.20)
Calcium: 10.2 mg/dL (ref 8.4–10.5)
Chloride: 102 mEq/L (ref 96–112)
GFR: 35.3 mL/min — AB (ref >60.00)
Glucose, Bld: 147 mg/dL — ABNORMAL HIGH (ref 70–99)
Potassium: 4.9 mEq/L (ref 3.5–5.1)
Sodium: 137 mEq/L (ref 135–145)

## 2017-10-07 LAB — HEMOGLOBIN A1C: Hgb A1c MFr Bld: 7 % — ABNORMAL HIGH (ref 4.6–6.5)

## 2017-10-07 LAB — LDL CHOLESTEROL, DIRECT: Direct LDL: 75 mg/dL

## 2017-10-07 MED ORDER — TRUEPLUS LANCETS 33G MISC: 1 refills | Status: DC

## 2017-10-07 NOTE — Patient Instructions (Signed)
Please complete lab work prior to leaving.   

## 2017-10-07 NOTE — Progress Notes (Signed)
Subjective:    Patient ID: [name], female    DOB: 1919/01/13, 83 y.o.   MRN: [id number]  HPI   DM2- sugar 170 this AM. Denies hypoglycemia. She is maintained on [place].   Lab Results  Component Value Date   HGBA1C 6.5 (H) 07/08/2017   HGBA1C 6.9 (H) 04/08/2017   HGBA1C 6.7 (H) 12/09/2016   Lab Results  Component Value Date   MICROALBUR 9.9 (H) 04/23/2016   LDLCALC 93 03/12/2016   CREATININE 1.64 (H) 07/08/2017   HTN- BP meds include amlodipine, micardis hct BP Readings from Last 3 Encounters:  10/07/17 (!) 142/50  07/08/17 (!) 143/64  04/08/17 (!) 144/65   Hypothyroid- mainatined on synthroid 88 mcg.  Lab Results  Component Value Date   TSH 1.34 07/08/2017   Hyperlipidemia- maintained on lipitor,  Lab Results  Component Value Date   CHOL 168 04/08/2017   HDL 39.70 04/08/2017   LDLCALC 93 03/12/2016   LDLDIRECT 76.0 04/08/2017   TRIG 328.0 (H) 04/08/2017   CHOLHDL 4 04/08/2017        Review of Systems  Respiratory: Negative for shortness of breath.   Cardiovascular: Negative for chest pain and leg swelling.   Past Medical History:  Diagnosis Date  . Anxiety    loss of child 1 year ago  . Cancer ([place])    right breast  . Diabetes mellitus, type 2 ([place])   . Gout   . Hyperlipidemia   . Hypertension    states eccho Dr Mare Ferrari 1 year ago- no record found in [facility] or office records  . Hyperthyroidism      Social History   Socioeconomic History  . Marital status: Widowed    Spouse name: Not on file  . Number of children: Not on file  . Years of education: Not on file  . Highest education level: Not on file  Social Needs  . Financial resource strain: Not on file  . Food insecurity - worry: Not on file  . Food insecurity - inability: Not on file  . Transportation needs - medical: Not on file  . Transportation needs - non-medical: Not on file  Occupational History  . Not on file  Tobacco Use  . Smoking status: Never Smoker  .  Smokeless tobacco: Never Used  Substance and Sexual Activity  . Alcohol use: No  . Drug use: No  . Sexual activity: Not on file  Other Topics Concern  . Not on file  Social History Narrative   Daughter helps with pt considerably- she lives across the street from her daughter   Homemaker   Completed high school   2 children, both daughters, one died at age 38   Enjoys spending time outside, yard work.   No pets    Past Surgical History:  Procedure Laterality Date  . ABDOMINAL HYSTERECTOMY  1995  . BREAST LUMPECTOMY Right 11/06/2011   Performed by Harl Bowie, MD at [facility] ORS  . CHOLECYSTECTOMY  1976  . ROTATOR CUFF REPAIR  1999   right    Family History  Problem Relation Age of Onset  . Cancer Mother        bladder  . Stroke Brother   . Diabetes Neg Hx   . Hypertension Neg Hx     No Known Allergies  Current Outpatient Medications on File Prior to Visit  Medication Sig Dispense Refill  . acebutolol (SECTRAL) 400 MG capsule Take 1 capsule (400 mg total) by  mouth at bedtime. 90 capsule 1  . amLODipine (NORVASC) 5 MG tablet Take 1 tablet (5 mg total) by mouth every morning. 90 tablet 1  . atorvastatin (LIPITOR) 10 MG tablet Take 1 tablet (10 mg total) by mouth at bedtime. 90 tablet 1  . diclofenac sodium (VOLTAREN) 1 % GEL Apply 4 g topically 4 (four) times daily. 100 g 1  . gabapentin (NEURONTIN) 100 MG capsule Take 2 capsules (200 mg total) by mouth 3 (three) times daily. 180 capsule 5  . glimepiride (AMARYL) 2 MG tablet Take 1 tablet (2 mg total) by mouth daily before breakfast. 90 tablet 1  . glucose blood (TRUETRACK TEST) test strip Use as instructed to check blood sugar once a day.  Dx E11.9 100 each 1  . levothyroxine (SYNTHROID, LEVOTHROID) 88 MCG tablet Take 1 tablet (88 mcg total) by mouth daily. 90 tablet 1  . sitaGLIPtin (JANUVIA) 50 MG tablet Take 1 tablet (50 mg total) by mouth every morning. 90 tablet 1  . telmisartan-hydrochlorothiazide (MICARDIS HCT)  80-12.5 MG tablet Take 1 tablet by mouth every morning. 90 tablet 1  . TRUEPLUS LANCETS 33G MISC Use to check blood sugar once a day. Dx E11.9 100 each 1   No current facility-administered medications on file prior to visit.     BP (!) 142/50 (BP Location: Right Arm, Patient Position: Sitting, Cuff Size: Small)   Pulse 64   Temp 98 F (36.7 C) (Oral)   Resp 16   Ht 5\' 7"  (1.702 m)   Wt 186 lb (84.4 kg)   SpO2 97%   BMI 29.13 kg/m       Objective:   Physical Exam  Constitutional: She is oriented to person, place, and time. She appears well-developed and well-nourished.  Cardiovascular: Normal rate, regular rhythm and normal heart sounds.  No murmur heard. Pulmonary/Chest: Effort normal and breath sounds normal. No respiratory distress. She has no wheezes.  Musculoskeletal:  1+ bilateral LE edema.  Neurological: She is alert and oriented to person, place, and time.  Psychiatric: She has a normal mood and affect. Her behavior is normal. Judgment and thought content normal.          Assessment & Plan:

## 2017-10-08 NOTE — Assessment & Plan Note (Signed)
Lab Results  Component Value Date   CHOL 158 10/07/2017   HDL 38.50 (L) 10/07/2017   LDLCALC 93 03/12/2016   LDLDIRECT 75.0 10/07/2017   TRIG 283.0 (H) 10/07/2017   CHOLHDL 4 10/07/2017   Trigs improved on follow up. Continue lipitor.

## 2017-10-08 NOTE — Assessment & Plan Note (Signed)
BP stable, continue current meds. 

## 2017-10-08 NOTE — Assessment & Plan Note (Signed)
Clinically stable on synthroid, continue same.  

## 2017-10-08 NOTE — Assessment & Plan Note (Signed)
Clinically stable on current meds. Continue same.

## 2017-12-18 DIAGNOSIS — H353111 Nonexudative age-related macular degeneration, right eye, early dry stage: Secondary | ICD-10-CM | POA: Diagnosis not present

## 2017-12-18 DIAGNOSIS — H353122 Nonexudative age-related macular degeneration, left eye, intermediate dry stage: Secondary | ICD-10-CM | POA: Diagnosis not present

## 2017-12-18 DIAGNOSIS — H1851 Endothelial corneal dystrophy: Secondary | ICD-10-CM | POA: Diagnosis not present

## 2017-12-18 DIAGNOSIS — Z9849 Cataract extraction status, unspecified eye: Secondary | ICD-10-CM | POA: Diagnosis not present

## 2017-12-18 DIAGNOSIS — Z961 Presence of intraocular lens: Secondary | ICD-10-CM | POA: Diagnosis not present

## 2017-12-18 DIAGNOSIS — H35433 Paving stone degeneration of retina, bilateral: Secondary | ICD-10-CM | POA: Diagnosis not present

## 2017-12-18 DIAGNOSIS — E119 Type 2 diabetes mellitus without complications: Secondary | ICD-10-CM | POA: Diagnosis not present

## 2018-01-07 ENCOUNTER — Ambulatory Visit (INDEPENDENT_AMBULATORY_CARE_PROVIDER_SITE_OTHER): Payer: Medicare Other | Admitting: Family

## 2018-01-07 ENCOUNTER — Encounter: Payer: Self-pay | Admitting: Family

## 2018-01-07 VITALS — BP 151/60 | HR 65 | Temp 97.7°F | Resp 18 | Ht 67.0 in | Wt 189.8 lb

## 2018-01-07 DIAGNOSIS — E039 Hypothyroidism, unspecified: Secondary | ICD-10-CM

## 2018-01-07 DIAGNOSIS — M1990 Unspecified osteoarthritis, unspecified site: Secondary | ICD-10-CM | POA: Diagnosis not present

## 2018-01-07 DIAGNOSIS — E785 Hyperlipidemia, unspecified: Secondary | ICD-10-CM

## 2018-01-07 DIAGNOSIS — I10 Essential (primary) hypertension: Secondary | ICD-10-CM

## 2018-01-07 DIAGNOSIS — E118 Type 2 diabetes mellitus with unspecified complications: Secondary | ICD-10-CM | POA: Diagnosis not present

## 2018-01-07 DIAGNOSIS — R739 Hyperglycemia, unspecified: Secondary | ICD-10-CM | POA: Diagnosis not present

## 2018-01-07 LAB — BASIC METABOLIC PANEL
BUN: 33 mg/dL — ABNORMAL HIGH (ref 6–23)
CALCIUM: 9.9 mg/dL (ref 8.4–10.5)
CO2: 30 mEq/L (ref 19–32)
Chloride: 102 mEq/L (ref 96–112)
Creatinine, Ser: 1.52 mg/dL — ABNORMAL HIGH (ref 0.40–1.20)
GFR: 33.68 mL/min — AB (ref >60.00)
Glucose, Bld: 111 mg/dL — ABNORMAL HIGH (ref 70–99)
Potassium: 5 mEq/L (ref 3.5–5.1)
SODIUM: 138 meq/L (ref 135–145)

## 2018-01-07 LAB — HEMOGLOBIN A1C: HEMOGLOBIN A1C: 6.9 % — AB (ref 4.6–6.5)

## 2018-01-07 LAB — TSH: TSH: 1.57 u[IU]/mL (ref 0.35–4.50)

## 2018-01-07 MED ORDER — GLIMEPIRIDE 2 MG PO TABS: 2.0000 mg | ORAL_TABLET | Freq: Every day | ORAL | 1 refills | Status: DC

## 2018-01-07 MED ORDER — ATORVASTATIN CALCIUM 10 MG PO TABS: 10.0000 mg | ORAL_TABLET | Freq: Every day | ORAL | 1 refills | Status: DC

## 2018-01-07 MED ORDER — ACEBUTOLOL HCL 400 MG PO CAPS: 400.0000 mg | ORAL_CAPSULE | Freq: Every day | ORAL | 1 refills | Status: DC

## 2018-01-07 MED ORDER — AMLODIPINE BESYLATE 5 MG PO TABS: 5.0000 mg | ORAL_TABLET | ORAL | 1 refills | Status: DC

## 2018-01-07 MED ORDER — TELMISARTAN-HCTZ 80-12.5 MG PO TABS: 1.0000 | ORAL_TABLET | ORAL | 1 refills | Status: DC

## 2018-01-07 MED ORDER — GABAPENTIN 100 MG PO CAPS: 200.0000 mg | ORAL_CAPSULE | Freq: Three times a day (TID) | ORAL | 5 refills | Status: DC

## 2018-01-07 MED ORDER — SITAGLIPTIN PHOSPHATE 50 MG PO TABS: 50.0000 mg | ORAL_TABLET | ORAL | 1 refills | Status: DC

## 2018-01-07 MED ORDER — LEVOTHYROXINE SODIUM 88 MCG PO TABS: 88.0000 ug | ORAL_TABLET | Freq: Every day | ORAL | 1 refills | Status: DC

## 2018-01-07 NOTE — Patient Instructions (Signed)
Please complete lab work prior to leaving. Take tylenol 1000mg  twice daily for arthritis pain.

## 2018-01-07 NOTE — Progress Notes (Signed)
Subjective:    Patient ID: [name], female    DOB: 09-May-1919, 84 y.o.   MRN: [id number]  HPI  Patient is a 84 yr old female who presents today for follow up.  DM2- reports sugar 147 this AM.  She is maintained on Amaryl 2 mg daily, Januvia 50 mg daily. Lab Results  Component Value Date   HGBA1C 7.0 (H) 10/07/2017   HGBA1C 6.5 (H) 07/08/2017   HGBA1C 6.9 (H) 04/08/2017   Lab Results  Component Value Date   MICROALBUR 9.9 (H) 04/23/2016   LDLCALC 93 03/12/2016   CREATININE 1.46 (H) 10/07/2017   Lab Results  Component Value Date   TSH 1.34 07/08/2017    HTN-current blood pressure medications and treated include Sectral, and Micardis HCT. BP Readings from Last 3 Encounters:  01/07/18 (!) 151/60  10/07/17 (!) 142/50  07/08/17 (!) 143/64   Hyperlipidemia-she is maintained on atorvastatin 10 mg once daily. Lab Results  Component Value Date   CHOL 158 10/07/2017   HDL 38.50 (L) 10/07/2017   LDLCALC 93 03/12/2016   LDLDIRECT 75.0 10/07/2017   TRIG 283.0 (H) 10/07/2017   CHOLHDL 4 10/07/2017    Hypothyroid-continue Synthroid. Lab Results  Component Value Date   TSH 1.57 01/07/2018   Her only complaint today is some lower extremity joint pain worse in the mornings.  She uses Tylenol for this.  She is requesting something stronger.  Review of Systems See HPI  Past Medical History:  Diagnosis Date  . Anxiety    loss of child 1 year ago  . Cancer ([place])    right breast  . Diabetes mellitus, type 2 ([place])   . Gout   . Hyperlipidemia   . Hypertension    states eccho Dr Mare Ferrari 1 year ago- no record found in [facility] or office records  . Hyperthyroidism      Social History   Socioeconomic History  . Marital status: Widowed    Spouse name: Not on file  . Number of children: Not on file  . Years of education: Not on file  . Highest education level: Not on file  Social Needs  . Financial resource strain: Not on file  . Food insecurity - worry: Not on file    . Food insecurity - inability: Not on file  . Transportation needs - medical: Not on file  . Transportation needs - non-medical: Not on file  Occupational History  . Not on file  Tobacco Use  . Smoking status: Never Smoker  . Smokeless tobacco: Never Used  Substance and Sexual Activity  . Alcohol use: No  . Drug use: No  . Sexual activity: Not on file  Other Topics Concern  . Not on file  Social History Narrative   Daughter helps with pt considerably- she lives across the street from her daughter   Homemaker   Completed high school   2 children, both daughters, one died at age 41   Enjoys spending time outside, yard work.   No pets    Past Surgical History:  Procedure Laterality Date  . ABDOMINAL HYSTERECTOMY  1995  . BREAST LUMPECTOMY  11/06/2011   Procedure: LUMPECTOMY;  Surgeon: Harl Bowie, MD;  Location: [facility];  Service: General;  Laterality: Right;  . CHOLECYSTECTOMY  1976  . ROTATOR CUFF REPAIR  1999   right    Family History  Problem Relation Age of Onset  . Cancer Mother        bladder  .  Stroke Brother   . Diabetes Neg Hx   . Hypertension Neg Hx     No Known Allergies  Current Outpatient Medications on File Prior to Visit  Medication Sig Dispense Refill  . acebutolol (SECTRAL) 400 MG capsule Take 1 capsule (400 mg total) by mouth at bedtime. 90 capsule 1  . amLODipine (NORVASC) 5 MG tablet Take 1 tablet (5 mg total) by mouth every morning. 90 tablet 1  . atorvastatin (LIPITOR) 10 MG tablet Take 1 tablet (10 mg total) by mouth at bedtime. 90 tablet 1  . diclofenac sodium (VOLTAREN) 1 % GEL Apply 4 g topically 4 (four) times daily. 100 g 1  . gabapentin (NEURONTIN) 100 MG capsule Take 2 capsules (200 mg total) by mouth 3 (three) times daily. 180 capsule 5  . glimepiride (AMARYL) 2 MG tablet Take 1 tablet (2 mg total) by mouth daily before breakfast. 90 tablet 1  . glucose blood (TRUETRACK TEST) test strip Use as instructed to check blood sugar  once a day.  Dx E11.9 100 each 1  . levothyroxine (SYNTHROID, LEVOTHROID) 88 MCG tablet Take 1 tablet (88 mcg total) by mouth daily. 90 tablet 1  . sitaGLIPtin (JANUVIA) 50 MG tablet Take 1 tablet (50 mg total) by mouth every morning. 90 tablet 1  . telmisartan-hydrochlorothiazide (MICARDIS HCT) 80-12.5 MG tablet Take 1 tablet by mouth every morning. 90 tablet 1  . TRUEPLUS LANCETS 33G MISC Use to check blood sugar once a day. Dx E11.9 100 each 1   No current facility-administered medications on file prior to visit.     BP (!) 151/60 (BP Location: Right Arm, Patient Position: Sitting, Cuff Size: Large)   Pulse 65   Temp 97.7 F (36.5 C) (Oral)   Resp 18   Ht 5\' 7"  (1.702 m)   Wt 189 lb 12.8 oz (86.1 kg)   SpO2 97%   BMI 29.73 kg/m       Objective:   Physical Exam  Constitutional: She is oriented to person, place, and time. She appears well-developed and well-nourished.  HENT:  Head: Normocephalic and atraumatic.  Cardiovascular: Normal rate and regular rhythm.  Murmur heard. Pulmonary/Chest: Effort normal and breath sounds normal. No respiratory distress. She has no wheezes.  Musculoskeletal:  1+ bilateral lower extremity edema  Neurological: She is alert and oriented to person, place, and time.  Psychiatric: She has a normal mood and affect. Her behavior is normal. Judgment and thought content normal.          Assessment & Plan:  Osteoarthritis-advised patient that she would not be a good candidate for NSAIDs due to her renal insufficiency.  Due to her advanced age narcotic type medication would increase her risk of falls.  I advised her to continue Tylenol 1000 mg twice daily.  Diabetes type 2-obtain follow-up A1c.  Clinically stable per home report.  A1c at goal for her age.  Hyperlipidemia-she has some mild hypertriglyceridemia which I am not overly worried about given her age.  Continue statin therapy.  Hypothyroid- clinically stable on Synthroid obtain follow-up  TSH.  Hypertension-blood pressure is acceptable for her age.  Tolerating current medications.  Continue same.

## 2018-01-22 ENCOUNTER — Encounter: Payer: Self-pay | Admitting: Family

## 2018-01-23 MED ORDER — GLUCOSE BLOOD VI STRP: ORAL_STRIP | 1 refills | Status: DC

## 2018-02-05 ENCOUNTER — Telehealth: Payer: Self-pay | Admitting: Family

## 2018-02-05 NOTE — Telephone Encounter (Signed)
Copied from [place] [id number]. Topic: Quick Communication - See Telephone Encounter >> Feb 05, 2018  4:08 PM Genella Rife H wrote: CRM for notification. See Telephone encounter for: 02/05/18.  Left message that we need to change her 04/07/18 appt per pcp. Pt has appt with wellness coach and might want to change both appts. Pt's daughter will call us back to reschedule.

## 2018-03-04 ENCOUNTER — Ambulatory Visit (INDEPENDENT_AMBULATORY_CARE_PROVIDER_SITE_OTHER): Payer: Medicare Other | Admitting: Podiatry

## 2018-03-04 DIAGNOSIS — M79675 Pain in left toe(s): Secondary | ICD-10-CM | POA: Diagnosis not present

## 2018-03-04 DIAGNOSIS — M79674 Pain in right toe(s): Secondary | ICD-10-CM

## 2018-03-04 DIAGNOSIS — B351 Tinea unguium: Secondary | ICD-10-CM | POA: Diagnosis not present

## 2018-03-04 DIAGNOSIS — E0843 Diabetes mellitus due to underlying condition with diabetic autonomic (poly)neuropathy: Secondary | ICD-10-CM

## 2018-03-10 NOTE — Progress Notes (Signed)
   SUBJECTIVE Patient with a history of diabetes mellitus presents to office today complaining of elongated, thickened nails that cause pain while ambulating in shoes. She is unable to trim her own nails. Patient is here for further evaluation and treatment.   Past Medical History:  Diagnosis Date  . Anxiety    loss of child 1 year ago  . Cancer ([place])    right breast  . Diabetes mellitus, type 2 ([place])   . Gout   . Hyperlipidemia   . Hypertension    states eccho Dr Mare Ferrari 1 year ago- no record found in [facility] or office records  . Hyperthyroidism     OBJECTIVE General Patient is awake, alert, and oriented x 3 and in no acute distress. Derm Skin is dry and supple bilateral. Negative open lesions or macerations. Remaining integument unremarkable. Nails are tender, long, thickened and dystrophic with subungual debris, consistent with onychomycosis, 1-5 bilateral. No signs of infection noted. Vasc  DP and PT pedal pulses palpable bilaterally. Temperature gradient within normal limits.  Neuro Epicritic and protective threshold sensation diminished bilaterally.  Musculoskeletal Exam No symptomatic pedal deformities noted bilateral. Muscular strength within normal limits.  ASSESSMENT 1. Diabetes Mellitus w/ peripheral neuropathy 2. Onychomycosis of nail due to dermatophyte bilateral 3. Pain in foot bilateral  PLAN OF CARE 1. Patient evaluated today. 2. Instructed to maintain good pedal hygiene and foot care. Stressed importance of controlling blood sugar.  3. Mechanical debridement of nails 1-5 bilaterally performed using a nail nipper. Filed with dremel without incident.  4. Return to clinic in 3 mos.     Edrick Kins, DPM [facility]  Dr. Edrick Kins, [address]                                        [place], [place] [address]                Office [phone]  Fax [phone]

## 2018-03-27 ENCOUNTER — Telehealth: Payer: Self-pay | Admitting: Family

## 2018-03-27 ENCOUNTER — Encounter: Payer: Self-pay | Admitting: Family Medicine

## 2018-03-27 ENCOUNTER — Ambulatory Visit (INDEPENDENT_AMBULATORY_CARE_PROVIDER_SITE_OTHER): Payer: Medicare Other | Admitting: Family Medicine

## 2018-03-27 VITALS — BP 132/62 | HR 70 | Temp 98.0°F | Ht 67.0 in | Wt 186.5 lb

## 2018-03-27 DIAGNOSIS — N3001 Acute cystitis with hematuria: Secondary | ICD-10-CM

## 2018-03-27 LAB — POC URINALSYSI DIPSTICK (AUTOMATED)
Bilirubin, UA: NEGATIVE
Glucose, UA: NEGATIVE
Ketones, UA: NEGATIVE
NITRITE UA: NEGATIVE
SPEC GRAV UA: 1.015 (ref 1.010–1.025)
UROBILINOGEN UA: 0.2 U/dL
pH, UA: 6 (ref 5.0–8.0)

## 2018-03-27 MED ORDER — CEPHALEXIN 500 MG PO CAPS: 500.0000 mg | ORAL_CAPSULE | Freq: Two times a day (BID) | ORAL | 0 refills | Status: AC

## 2018-03-27 NOTE — Progress Notes (Signed)
Pre visit review using our clinic review tool, if applicable. No additional management support is needed unless otherwise documented below in the visit note. 

## 2018-03-27 NOTE — Telephone Encounter (Signed)
Notified pt's daughter and scheduled appt for today at 3:15pm with Dr Nani Ravens.

## 2018-03-27 NOTE — Patient Instructions (Addendum)
Stay hydrated.  If you start having nausea/vomiting, fevers, or new/worsening symptoms, seek care.  Since we are unable to get a urine culture, if we are not improved by next week, I would like you to return to the clinic.   Let us know if you need anything.

## 2018-03-27 NOTE — Telephone Encounter (Signed)
Given her age we really should schedule her for a full visit please.

## 2018-03-27 NOTE — Telephone Encounter (Signed)
Copied from [place] [id number]. Topic: Quick Communication - See Telephone Encounter >> Mar 27, 2018  9:13 AM Percell Belt A wrote: CRM for notification. See Telephone encounter for: 03/27/18.  Pt daughter called in and would like to know if they could bring pt by the office to give a urine sample.  They think she has a uti?    Pharmacy - [facility] in [place] number [phone]

## 2018-03-27 NOTE — Progress Notes (Signed)
Chief Complaint  Patient presents with  . Dysuria    [name] is a 84 y.o. female here for possible UTI.  Duration: 3 days. Symptoms: urinary frequency and dysuria Denies: hematuria, urinary retention, N/V, fever, vaginal discharge Hx of recurrent UTI? No  ROS:  Constitutional: denies fever GU: As noted in HPI  Past Medical History:  Diagnosis Date  . Anxiety    loss of child 1 year ago  . Cancer ([place])    right breast  . Diabetes mellitus, type 2 ([place])   . Gout   . Hyperlipidemia   . Hypertension    states eccho Dr Mare Ferrari 1 year ago- no record found in [facility] or office records  . Hyperthyroidism     BP 132/62 (BP Location: Left Arm, Patient Position: Sitting, Cuff Size: Normal)   Pulse 70   Temp 98 F (36.7 C) (Oral)   Ht 5\' 7"  (1.702 m)   Wt 186 lb 8 oz (84.6 kg)   SpO2 96%   BMI 29.21 kg/m  General: Awake, alert, appears stated age HEENT: MMM Heart: RRR, no murmurs Lungs: CTAB, normal respiratory effort, no accessory muscle usage Abd: BS+, soft, NT, ND, no masses or organomegaly MSK: No CVA tenderness, neg Lloyd's sign Psych: Age appropriate judgment and insight  Acute cystitis with hematuria - Plan: POCT Urinalysis Dipstick (Automated), cephALEXin (KEFLEX) 500 MG capsule  Orders as above. UA suggestive of infection. Stay hydrated. Seek immediate care if pt starts to develop fevers, new/worsening symptoms, uncontrollable N/V. F/u prn. The patient voiced understanding and agreement to the plan.  [place], DO 03/27/18 4:11 PM

## 2018-04-01 NOTE — Progress Notes (Signed)
Subjective:   [name] is a 84 y.o. female who presents for Medicare Annual (Subsequent) preventive examination.  Review of Systems: No ROS.  Medicare Wellness Visit. Additional risk factors are reflected in the social history.    Sleep patterns: sleeps well per pt about 10 hrs Home Safety/Smoke Alarms: Feels safe in home. Smoke alarms in place.  Living environment; residence and Firearm Safety: 1 story house. Lives alone. Dtr lives across street.   Female:     Mammo- No longer doing routine screening due to age.      Dexa scan-   No longer doing routine screening due to age.     Objective:     Vitals: BP (!) 147/65 (BP Location: Right Arm, Patient Position: Sitting, Cuff Size: Normal) Comment: All vitals taken by R.Sierra CMA @1036   Pulse 62   Ht 5\' 7"  (1.702 m)   Wt 186 lb (84.4 kg)   SpO2 97%   BMI 29.13 kg/m   Body mass index is 29.13 kg/m.  Advanced Directives 04/07/2018 12/29/2014 10/31/2011  Does Patient Have a Medical Advance Directive? Yes Yes Patient has advance directive, copy not in chart  Type of Advance Directive [place];Living will [place];Living will Living will  Does patient want to make changes to medical advance directive? No - Patient declined No - Patient declined -  Copy of [place] in Chart? No - copy requested No - copy requested Copy requested from family    Tobacco Social History   Tobacco Use  Smoking Status Never Smoker  Smokeless Tobacco Never Used     Counseling given: Not Answered   Clinical Intake:     Pain : No/denies pain                 Past Medical History:  Diagnosis Date  . Anxiety    loss of child 1 year ago  . Cancer ([place])    right breast  . Diabetes mellitus, type 2 ([place])   . Gout   . Hyperlipidemia   . Hypertension    states eccho Dr Mare Ferrari 1 year ago- no record found in [facility] or office records  . Hyperthyroidism    Past Surgical  History:  Procedure Laterality Date  . ABDOMINAL HYSTERECTOMY  1995  . BREAST LUMPECTOMY  11/06/2011   Procedure: LUMPECTOMY;  Surgeon: Harl Bowie, MD;  Location: [facility];  Service: General;  Laterality: Right;  . CHOLECYSTECTOMY  1976  . ROTATOR CUFF REPAIR  1999   right   Family History  Problem Relation Age of Onset  . Cancer Mother        bladder  . Stroke Brother   . Diabetes Neg Hx   . Hypertension Neg Hx    Social History   Socioeconomic History  . Marital status: Widowed    Spouse name: Not on file  . Number of children: Not on file  . Years of education: Not on file  . Highest education level: Not on file  Occupational History  . Not on file  Social Needs  . Financial resource strain: Not on file  . Food insecurity:    Worry: Not on file    Inability: Not on file  . Transportation needs:    Medical: Not on file    Non-medical: Not on file  Tobacco Use  . Smoking status: Never Smoker  . Smokeless tobacco: Never Used  Substance and Sexual Activity  .  Alcohol use: No  . Drug use: No  . Sexual activity: Not on file  Lifestyle  . Physical activity:    Days per week: Not on file    Minutes per session: Not on file  . Stress: Not on file  Relationships  . Social connections:    Talks on phone: Not on file    Gets together: Not on file    Attends religious service: Not on file    Active member of club or organization: Not on file    Attends meetings of clubs or organizations: Not on file    Relationship status: Not on file  Other Topics Concern  . Not on file  Social History Narrative   Daughter helps with pt considerably- she lives across the street from her daughter   Homemaker   Completed high school   2 children, both daughters, one died at age 53   Enjoys spending time outside, yard work.   No pets    Outpatient Encounter Medications as of 04/07/2018  Medication Sig  . acebutolol (SECTRAL) 400 MG capsule Take 1 capsule (400 mg total) by  mouth at bedtime.  [name] amLODipine (NORVASC) 5 MG tablet Take 1 tablet (5 mg total) by mouth every morning.  [name] atorvastatin (LIPITOR) 10 MG tablet Take 1 tablet (10 mg total) by mouth at bedtime.  . [EXPIRED] cephALEXin (KEFLEX) 500 MG capsule Take 1 capsule (500 mg total) by mouth 2 (two) times daily for 7 days.  . diclofenac sodium (VOLTAREN) 1 % GEL Apply 4 g topically 4 (four) times daily.  [name] gabapentin (NEURONTIN) 100 MG capsule Take 2 capsules (200 mg total) by mouth 3 (three) times daily.  [name] glimepiride (AMARYL) 2 MG tablet Take 1 tablet (2 mg total) by mouth daily before breakfast.  . glucose blood (TRUETRACK TEST) test strip Use as instructed to check blood sugar once a day.  Dx E11.9  . levothyroxine (SYNTHROID, LEVOTHROID) 88 MCG tablet Take 1 tablet (88 mcg total) by mouth daily.  . sitaGLIPtin (JANUVIA) 50 MG tablet Take 1 tablet (50 mg total) by mouth every morning.  [name] telmisartan-hydrochlorothiazide (MICARDIS HCT) 80-12.5 MG tablet Take 1 tablet by mouth every morning.  . TRUEPLUS LANCETS 33G MISC Use to check blood sugar once a day. Dx E11.9   No facility-administered encounter medications on file as of 04/07/2018.     Activities of Daily Living In your present state of health, do you have any difficulty performing the following activities: 04/07/2018 04/08/2017  Hearing? Tempie Donning  Comment wears hearing aids wears hearing aids in both ears and still has some difficulty hearing  Vision? N N  Comment wears reading glasses. Uses reading glasses  Difficulty concentrating or making decisions? N N  Walking or climbing stairs? Y N  Comment uses cane. Currently having pain in both legs. Uses rails for stairs and takes her time  Dressing or bathing? N N  Doing errands, shopping? N Y  Comment - drives to [place] on Sunday in very light traffic. Has family for other transportation.  Preparing Food and eating ? N -  Using the Toilet? N -  In the past six months, have you accidently leaked  urine? N -  Do you have problems with loss of bowel control? N -  Managing your Medications? N -  Managing your Finances? N -  Housekeeping or managing your Housekeeping? N -  Some recent data might be hidden    Patient Care Team: Debbrah Alar,  NP as PCP - General (Internal Medicine)    Assessment:   This is a routine wellness examination for [name]. Physical assessment deferred to PCP.  Exercise Activities and Dietary recommendations Current Exercise Habits: The patient does not participate in regular exercise at present Diet (meal preparation, eat out, water intake, caffeinated beverages, dairy products, fruits and vegetables): in general, a "healthy" diet  , well balanced Breakfast: 1 cup coffee. Special k  Lunch: pimento chs sandwich. lemonade  Dinner:   Burger, potatoes, fruit, beans. Sugar free lemonade   Goals    . Patient Stated     Maintain independence       Fall Risk Fall Risk  04/07/2018 04/08/2017 01/23/2016 12/20/2014 12/16/2013  Falls in the past year? No No No No No   Depression Screen PHQ 2/9 Scores 04/07/2018 04/08/2017 01/23/2016 12/20/2014  PHQ - 2 Score 0 0 0 0  PHQ- 9 Score - 0 - -     Cognitive Function MMSE - Mini Mental State Exam 04/07/2018  Not completed: Refused        Immunization History  Administered Date(s) Administered  . Influenza Split 09/17/2012  . Influenza Whole 07/19/2013  . Influenza, High Dose Seasonal PF 08/30/2015  . Influenza,inj,Quad PF,6+ Mos 08/24/2014  . Influenza-Unspecified 08/28/2016, 08/20/2017  . Pneumococcal Conjugate-13 12/20/2014  . Pneumococcal Polysaccharide-23 12/16/2013  . Zoster 11/19/2011    Screening Tests Health Maintenance  Topic Date Due  . TETANUS/TDAP  08/21/1940  . DEXA SCAN  08/21/1986  . FOOT EXAM  09/04/2017  . MAMMOGRAM  10/15/2017  . OPHTHALMOLOGY EXAM  04/08/2018 (Originally 08/13/2015)  . INFLUENZA VACCINE  06/18/2018  . HEMOGLOBIN A1C  07/07/2018  . PNA vac Low Risk Adult  Completed         Plan:   Follow up with PCP as directed  Please schedule your next medicare wellness visit with me in 1 yr.  Continue to eat heart healthy diet (full of fruits, vegetables, whole grains, lean protein, water--limit salt, fat, and sugar intake) and increase physical activity as tolerated.  Continue doing brain stimulating activities (puzzles, reading, adult coloring books, staying active) to keep memory sharp.     I have personally reviewed and noted the following in the patient's chart:   . Medical and social history . Use of alcohol, tobacco or illicit drugs  . Current medications and supplements . Functional ability and status . Nutritional status . Physical activity . Advanced directives . List of other physicians . Hospitalizations, surgeries, and ER visits in previous 12 months . Vitals . Screenings to include cognitive, depression, and falls . Referrals and appointments  In addition, I have reviewed and discussed with patient certain preventive protocols, quality metrics, and best practice recommendations. A written personalized care plan for preventive services as well as general preventive health recommendations were provided to patient.     Shela Nevin, [place]  04/07/2018

## 2018-04-06 ENCOUNTER — Telehealth: Payer: Self-pay | Admitting: *Deleted

## 2018-04-06 NOTE — Telephone Encounter (Signed)
OK with me.

## 2018-04-06 NOTE — Telephone Encounter (Signed)
Notified pt's daughter and told her when they are ready to schedule an appointment to establish care with Dr Alfonso Ramus they may call their office to set that up.  Copied from [place] [id number]. Topic: Quick Communication - See Telephone Encounter >> Apr 06, 2018 11:15 AM Antonieta Iba C wrote: CRM for notification. See Telephone encounter for: 04/06/18.  Pt's daughter would like to know if pt is able to switch providers. She said that pt is 31 and it is more convenient for her to be seen at [facility] location to see Dr. Alfonso Ramus, please advise is this switch okay.   ALSO, Lenna Sciara pt will be at her apt tomorrow with you.

## 2018-04-07 ENCOUNTER — Ambulatory Visit (INDEPENDENT_AMBULATORY_CARE_PROVIDER_SITE_OTHER): Payer: Medicare Other | Admitting: Family

## 2018-04-07 ENCOUNTER — Encounter: Payer: Self-pay | Admitting: *Deleted

## 2018-04-07 ENCOUNTER — Ambulatory Visit: Payer: Self-pay | Admitting: Family

## 2018-04-07 ENCOUNTER — Encounter: Payer: Self-pay | Admitting: Family

## 2018-04-07 ENCOUNTER — Ambulatory Visit (INDEPENDENT_AMBULATORY_CARE_PROVIDER_SITE_OTHER): Payer: Medicare Other | Admitting: *Deleted

## 2018-04-07 VITALS — BP 147/65 | HR 62 | Ht 67.0 in | Wt 186.0 lb

## 2018-04-07 VITALS — BP 147/65 | HR 62 | Temp 98.0°F | Resp 16 | Ht 67.0 in | Wt 186.6 lb

## 2018-04-07 DIAGNOSIS — E118 Type 2 diabetes mellitus with unspecified complications: Secondary | ICD-10-CM | POA: Diagnosis not present

## 2018-04-07 DIAGNOSIS — L989 Disorder of the skin and subcutaneous tissue, unspecified: Secondary | ICD-10-CM | POA: Diagnosis not present

## 2018-04-07 DIAGNOSIS — E785 Hyperlipidemia, unspecified: Secondary | ICD-10-CM | POA: Diagnosis not present

## 2018-04-07 DIAGNOSIS — I10 Essential (primary) hypertension: Secondary | ICD-10-CM

## 2018-04-07 DIAGNOSIS — E039 Hypothyroidism, unspecified: Secondary | ICD-10-CM | POA: Diagnosis not present

## 2018-04-07 DIAGNOSIS — Z0000 Encounter for general adult medical examination without abnormal findings: Secondary | ICD-10-CM | POA: Diagnosis not present

## 2018-04-07 LAB — BASIC METABOLIC PANEL
BUN: 36 mg/dL — ABNORMAL HIGH (ref 6–23)
CHLORIDE: 101 meq/L (ref 96–112)
CO2: 28 meq/L (ref 19–32)
Calcium: 10 mg/dL (ref 8.4–10.5)
Creatinine, Ser: 1.47 mg/dL — ABNORMAL HIGH (ref 0.40–1.20)
GFR: 34.98 mL/min — ABNORMAL LOW (ref >60.00)
Glucose, Bld: 113 mg/dL — ABNORMAL HIGH (ref 70–99)
Potassium: 5 mEq/L (ref 3.5–5.1)
SODIUM: 138 meq/L (ref 135–145)

## 2018-04-07 LAB — LIPID PANEL
Cholesterol: 166 mg/dL (ref 0–200)
HDL: 42.5 mg/dL (ref >39.00)
NONHDL: 123.43
TRIGLYCERIDES: 368 mg/dL — AB (ref 0.0–149.0)
Total CHOL/HDL Ratio: 4
VLDL: 73.6 mg/dL — ABNORMAL HIGH (ref 0.0–40.0)

## 2018-04-07 LAB — LDL CHOLESTEROL, DIRECT: LDL DIRECT: 76 mg/dL

## 2018-04-07 LAB — HEMOGLOBIN A1C: Hgb A1c MFr Bld: 7.1 % — ABNORMAL HIGH (ref 4.6–6.5)

## 2018-04-07 NOTE — Progress Notes (Signed)
Subjective:    Patient ID: [name], female    DOB: 1919/01/19, 84 y.o.   MRN: [id number]  HPI  Pt is a 83 yr old female who presents today for follow up.  DM2- she is currently maintained on amaryl 2mg , januvia. Reports sugars generally in the 150-160's. Denies hypoglycemia.  Lab Results  Component Value Date   HGBA1C 6.9 (H) 01/07/2018   HGBA1C 7.0 (H) 10/07/2017   HGBA1C 6.5 (H) 07/08/2017   Lab Results  Component Value Date   MICROALBUR 9.9 (H) 04/23/2016   LDLCALC 93 03/12/2016   CREATININE 1.52 (H) 01/07/2018   HTN- maintained on sectral, amlodipine, micardis HCT BP Readings from Last 3 Encounters:  04/07/18 (!) 147/65  03/27/18 132/62  01/07/18 (!) 151/60   Hyperlipidemia- maintained on atorvastatin.   Lab Results  Component Value Date   CHOL 158 10/07/2017   HDL 38.50 (L) 10/07/2017   LDLCALC 93 03/12/2016   LDLDIRECT 75.0 10/07/2017   TRIG 283.0 (H) 10/07/2017   CHOLHDL 4 10/07/2017   Hypothyroid- maintained on synthroid. Reports feeling well on current dose of synthroid.  Lab Results  Component Value Date   TSH 1.57 01/07/2018      Review of Systems    see HPI  Past Medical History:  Diagnosis Date  . Anxiety    loss of child 1 year ago  . Cancer ([place])    right breast  . Diabetes mellitus, type 2 ([place])   . Gout   . Hyperlipidemia   . Hypertension    states eccho Dr Mare Ferrari 1 year ago- no record found in [facility] or office records  . Hyperthyroidism      Social History   Socioeconomic History  . Marital status: Widowed    Spouse name: Not on file  . Number of children: Not on file  . Years of education: Not on file  . Highest education level: Not on file  Occupational History  . Not on file  Social Needs  . Financial resource strain: Not on file  . Food insecurity:    Worry: Not on file    Inability: Not on file  . Transportation needs:    Medical: Not on file    Non-medical: Not on file  Tobacco Use  . Smoking status:  Never Smoker  . Smokeless tobacco: Never Used  Substance and Sexual Activity  . Alcohol use: No  . Drug use: No  . Sexual activity: Not on file  Lifestyle  . Physical activity:    Days per week: Not on file    Minutes per session: Not on file  . Stress: Not on file  Relationships  . Social connections:    Talks on phone: Not on file    Gets together: Not on file    Attends religious service: Not on file    Active member of club or organization: Not on file    Attends meetings of clubs or organizations: Not on file    Relationship status: Not on file  . Intimate partner violence:    Fear of current or ex partner: Not on file    Emotionally abused: Not on file    Physically abused: Not on file    Forced sexual activity: Not on file  Other Topics Concern  . Not on file  Social History Narrative   Daughter helps with pt considerably- she lives across the street from her daughter   Homemaker   Completed high school  2 children, both daughters, one died at age 59   Enjoys spending time outside, yard work.   No pets    Past Surgical History:  Procedure Laterality Date  . ABDOMINAL HYSTERECTOMY  1995  . BREAST LUMPECTOMY  11/06/2011   Procedure: LUMPECTOMY;  Surgeon: Harl Bowie, MD;  Location: [facility];  Service: General;  Laterality: Right;  . CHOLECYSTECTOMY  1976  . ROTATOR CUFF REPAIR  1999   right    Family History  Problem Relation Age of Onset  . Cancer Mother        bladder  . Stroke Brother   . Diabetes Neg Hx   . Hypertension Neg Hx     No Known Allergies  Current Outpatient Medications on File Prior to Visit  Medication Sig Dispense Refill  . acebutolol (SECTRAL) 400 MG capsule Take 1 capsule (400 mg total) by mouth at bedtime. 90 capsule 1  . amLODipine (NORVASC) 5 MG tablet Take 1 tablet (5 mg total) by mouth every morning. 90 tablet 1  . atorvastatin (LIPITOR) 10 MG tablet Take 1 tablet (10 mg total) by mouth at bedtime. 90 tablet 1  .  diclofenac sodium (VOLTAREN) 1 % GEL Apply 4 g topically 4 (four) times daily. 100 g 1  . gabapentin (NEURONTIN) 100 MG capsule Take 2 capsules (200 mg total) by mouth 3 (three) times daily. 180 capsule 5  . glimepiride (AMARYL) 2 MG tablet Take 1 tablet (2 mg total) by mouth daily before breakfast. 90 tablet 1  . glucose blood (TRUETRACK TEST) test strip Use as instructed to check blood sugar once a day.  Dx E11.9 100 each 1  . levothyroxine (SYNTHROID, LEVOTHROID) 88 MCG tablet Take 1 tablet (88 mcg total) by mouth daily. 90 tablet 1  . sitaGLIPtin (JANUVIA) 50 MG tablet Take 1 tablet (50 mg total) by mouth every morning. 90 tablet 1  . telmisartan-hydrochlorothiazide (MICARDIS HCT) 80-12.5 MG tablet Take 1 tablet by mouth every morning. 90 tablet 1  . TRUEPLUS LANCETS 33G MISC Use to check blood sugar once a day. Dx E11.9 100 each 1   No current facility-administered medications on file prior to visit.     BP (!) 147/65 (BP Location: Right Arm, Patient Position: Sitting, Cuff Size: Small)   Pulse 62   Temp 98 F (36.7 C) (Oral)   Resp 16   Ht 5\' 7"  (1.702 m)   Wt 186 lb 9.6 oz (84.6 kg)   SpO2 97%   BMI 29.23 kg/m    Objective:   Physical Exam  Constitutional: She is oriented to person, place, and time. She appears well-developed and well-nourished.  Cardiovascular: Normal rate, regular rhythm and normal heart sounds.  No murmur heard. Pulmonary/Chest: Effort normal and breath sounds normal. No respiratory distress. She has no wheezes.  Musculoskeletal: She exhibits no edema.  Neurological: She is alert and oriented to person, place, and time.  Skin: Skin is warm and dry.  Pink flat tender skin lesion on left cheek.   Psychiatric: She has a normal mood and affect. Her behavior is normal. Judgment and thought content normal.          Assessment & Plan:  Skin lesion- offered to arrange apt with dermatology, she declines states she has a dermatologist and will  schedule.  HTN- bp is acceptable for her age.  Continue current meds.  DM2- clinically stable, obtain a1c.  Continue current meds.   Hyperlipidemia- repeat lipid panel, continue statin.   Hypothyroid-  clinically stable, continue current dose of synthroid.

## 2018-04-07 NOTE — Patient Instructions (Signed)
Follow up with PCP as directed  Please schedule your next medicare wellness visit with me in 1 yr.  Continue to eat heart healthy diet (full of fruits, vegetables, whole grains, lean protein, water--limit salt, fat, and sugar intake) and increase physical activity as tolerated.  Continue doing brain stimulating activities (puzzles, reading, adult coloring books, staying active) to keep memory sharp.    [name] , Thank you for taking time to come for your Medicare Wellness Visit. I appreciate your ongoing commitment to your health goals. Please review the following plan we discussed and let me know if I can assist you in the future.   These are the goals we discussed: Goals    . Patient Stated     Maintain independence       This is a list of the screening recommended for you and due dates:  Health Maintenance  Topic Date Due  . Tetanus Vaccine  08/21/1940  . DEXA scan (bone density measurement)  08/21/1986  . Complete foot exam   09/04/2017  . Mammogram  10/15/2017  . Eye exam for diabetics  04/08/2018*  . Flu Shot  06/18/2018  . Hemoglobin A1C  07/07/2018  . Pneumonia vaccines  Completed  *Topic was postponed. The date shown is not the original due date.    Health Maintenance for Postmenopausal Women Menopause is a normal process in which your reproductive ability comes to an end. This process happens gradually over a span of months to years, usually between the ages of 60 and 64. Menopause is complete when you have missed 12 consecutive menstrual periods. It is important to talk with your health care provider about some of the most common conditions that affect postmenopausal women, such as heart disease, cancer, and bone loss (osteoporosis). Adopting a healthy lifestyle and getting preventive care can help to promote your health and wellness. Those actions can also lower your chances of developing some of these common conditions. What should I know about menopause? During  menopause, you may experience a number of symptoms, such as:  Moderate-to-severe hot flashes.  Night sweats.  Decrease in sex drive.  Mood swings.  Headaches.  Tiredness.  Irritability.  Memory problems.  Insomnia.  Choosing to treat or not to treat menopausal changes is an individual decision that you make with your health care provider. What should I know about hormone replacement therapy and supplements? Hormone therapy products are effective for treating symptoms that are associated with menopause, such as hot flashes and night sweats. Hormone replacement carries certain risks, especially as you become older. If you are thinking about using estrogen or estrogen with progestin treatments, discuss the benefits and risks with your health care provider. What should I know about heart disease and stroke? Heart disease, heart attack, and stroke become more likely as you age. This may be due, in part, to the hormonal changes that your body experiences during menopause. These can affect how your body processes dietary fats, triglycerides, and cholesterol. Heart attack and stroke are both medical emergencies. There are many things that you can do to help prevent heart disease and stroke:  Have your blood pressure checked at least every 1-2 years. High blood pressure causes heart disease and increases the risk of stroke.  If you are 63-59 years old, ask your health care provider if you should take aspirin to prevent a heart attack or a stroke.  Do not use any tobacco products, including cigarettes, chewing tobacco, or electronic cigarettes. If you need  help quitting, ask your health care provider.  It is important to eat a healthy diet and maintain a healthy weight. ? Be sure to include plenty of vegetables, fruits, low-fat dairy products, and lean protein. ? Avoid eating foods that are high in solid fats, added sugars, or salt (sodium).  Get regular exercise. This is one of the most  important things that you can do for your health. ? Try to exercise for at least 150 minutes each week. The type of exercise that you do should increase your heart rate and make you sweat. This is known as moderate-intensity exercise. ? Try to do strengthening exercises at least twice each week. Do these in addition to the moderate-intensity exercise.  Know your numbers.Ask your health care provider to check your cholesterol and your blood glucose. Continue to have your blood tested as directed by your health care provider.  What should I know about cancer screening? There are several types of cancer. Take the following steps to reduce your risk and to catch any cancer development as early as possible. Breast Cancer  Practice breast self-awareness. ? This means understanding how your breasts normally appear and feel. ? It also means doing regular breast self-exams. Let your health care provider know about any changes, Henson matter how small.  If you are 68 or older, have a clinician do a breast exam (clinical breast exam or CBE) every year. Depending on your age, family history, and medical history, it may be recommended that you also have a yearly breast X-ray (mammogram).  If you have a family history of breast cancer, talk with your health care provider about genetic screening.  If you are at high risk for breast cancer, talk with your health care provider about having an MRI and a mammogram every year.  Breast cancer (BRCA) gene test is recommended for women who have family members with BRCA-related cancers. Results of the assessment will determine the need for genetic counseling and BRCA1 and for BRCA2 testing. BRCA-related cancers include these types: ? Breast. This occurs in males or females. ? Ovarian. ? Tubal. This may also be called fallopian tube cancer. ? Cancer of the abdominal or pelvic lining (peritoneal cancer). ? Prostate. ? Pancreatic.  Cervical, Uterine, and Ovarian  Cancer Your health care provider may recommend that you be screened regularly for cancer of the pelvic organs. These include your ovaries, uterus, and vagina. This screening involves a pelvic exam, which includes checking for microscopic changes to the surface of your cervix (Pap test).  For women ages 21-65, health care providers may recommend a pelvic exam and a Pap test every three years. For women ages 8-65, they may recommend the Pap test and pelvic exam, combined with testing for human papilloma virus (HPV), every five years. Some types of HPV increase your risk of cervical cancer. Testing for HPV may also be done on women of any age who have unclear Pap test results.  Other health care providers may not recommend any screening for nonpregnant women who are considered low risk for pelvic cancer and have Henson symptoms. Ask your health care provider if a screening pelvic exam is right for you.  If you have had past treatment for cervical cancer or a condition that could lead to cancer, you need Pap tests and screening for cancer for at least 20 years after your treatment. If Pap tests have been discontinued for you, your risk factors (such as having a new sexual partner) need to be reassessed  to determine if you should start having screenings again. Some women have medical problems that increase the chance of getting cervical cancer. In these cases, your health care provider may recommend that you have screening and Pap tests more often.  If you have a family history of uterine cancer or ovarian cancer, talk with your health care provider about genetic screening.  If you have vaginal bleeding after reaching menopause, tell your health care provider.  There are currently Henson reliable tests available to screen for ovarian cancer.  Lung Cancer Lung cancer screening is recommended for adults 35-85 years old who are at high risk for lung cancer because of a history of smoking. A yearly low-dose CT scan  of the lungs is recommended if you:  Currently smoke.  Have a history of at least 30 pack-years of smoking and you currently smoke or have quit within the past 15 years. A pack-year is smoking an average of one pack of cigarettes per day for one year.  Yearly screening should:  Continue until it has been 15 years since you quit.  Stop if you develop a health problem that would prevent you from having lung cancer treatment.  Colorectal Cancer  This type of cancer can be detected and can often be prevented.  Routine colorectal cancer screening usually begins at age 18 and continues through age 10.  If you have risk factors for colon cancer, your health care provider may recommend that you be screened at an earlier age.  If you have a family history of colorectal cancer, talk with your health care provider about genetic screening.  Your health care provider may also recommend using home test kits to check for hidden blood in your stool.  A small camera at the end of a tube can be used to examine your colon directly (sigmoidoscopy or colonoscopy). This is done to check for the earliest forms of colorectal cancer.  Direct examination of the colon should be repeated every 5-10 years until age 64. However, if early forms of precancerous polyps or small growths are found or if you have a family history or genetic risk for colorectal cancer, you may need to be screened more often.  Skin Cancer  Check your skin from head to toe regularly.  Monitor any moles. Be sure to tell your health care provider: ? About any new moles or changes in moles, especially if there is a change in a mole's shape or color. ? If you have a mole that is larger than the size of a pencil eraser.  If any of your family members has a history of skin cancer, especially at a young age, talk with your health care provider about genetic screening.  Always use sunscreen. Apply sunscreen liberally and repeatedly  throughout the day.  Whenever you are outside, protect yourself by wearing long sleeves, pants, a wide-brimmed hat, and sunglasses.  What should I know about osteoporosis? Osteoporosis is a condition in which bone destruction happens more quickly than new bone creation. After menopause, you may be at an increased risk for osteoporosis. To help prevent osteoporosis or the bone fractures that can happen because of osteoporosis, the following is recommended:  If you are 84-10 years old, get at least 1,000 mg of calcium and at least 600 mg of vitamin D per day.  If you are older than age 11 but younger than age 10, get at least 1,200 mg of calcium and at least 600 mg of vitamin D per day.  If you are older than age 76, get at least 1,200 mg of calcium and at least 800 mg of vitamin D per day.  Smoking and excessive alcohol intake increase the risk of osteoporosis. Eat foods that are rich in calcium and vitamin D, and do weight-bearing exercises several times each week as directed by your health care provider. What should I know about how menopause affects my mental health? Depression may occur at any age, but it is more common as you become older. Common symptoms of depression include:  Low or sad mood.  Changes in sleep patterns.  Changes in appetite or eating patterns.  Feeling an overall lack of motivation or enjoyment of activities that you previously enjoyed.  Frequent crying spells.  Talk with your health care provider if you think that you are experiencing depression. What should I know about immunizations? It is important that you get and maintain your immunizations. These include:  Tetanus, diphtheria, and pertussis (Tdap) booster vaccine.  Influenza every year before the flu season begins.  Pneumonia vaccine.  Shingles vaccine.  Your health care provider may also recommend other immunizations. This information is not intended to replace advice given to you by your health  care provider. Make sure you discuss any questions you have with your health care provider. Document Released: 12/27/2005 Document Revised: 05/24/2016 Document Reviewed: 08/08/2015 Elsevier Interactive Patient Education  2018 [employer].

## 2018-04-07 NOTE — Progress Notes (Signed)
Reviewed RN note and agree.

## 2018-04-07 NOTE — Patient Instructions (Signed)
Please complete lab work prior to leaving.   

## 2018-04-08 ENCOUNTER — Ambulatory Visit: Payer: Self-pay | Admitting: Family

## 2018-04-09 ENCOUNTER — Ambulatory Visit: Payer: Self-pay | Admitting: *Deleted

## 2018-04-11 ENCOUNTER — Encounter: Payer: Self-pay | Admitting: Family

## 2018-04-14 ENCOUNTER — Ambulatory Visit (INDEPENDENT_AMBULATORY_CARE_PROVIDER_SITE_OTHER): Payer: Medicare Other | Admitting: Family Medicine

## 2018-04-14 ENCOUNTER — Other Ambulatory Visit: Payer: Self-pay

## 2018-04-14 ENCOUNTER — Encounter: Payer: Self-pay | Admitting: Family Medicine

## 2018-04-14 VITALS — BP 132/76 | HR 73 | Temp 97.8°F | Ht 67.0 in | Wt 185.5 lb

## 2018-04-14 DIAGNOSIS — R350 Frequency of micturition: Secondary | ICD-10-CM | POA: Diagnosis not present

## 2018-04-14 MED ORDER — NITROFURANTOIN MONOHYD MACRO 100 MG PO CAPS: 100.0000 mg | ORAL_CAPSULE | Freq: Two times a day (BID) | ORAL | 0 refills | Status: AC

## 2018-04-14 NOTE — Addendum Note (Signed)
Addended by: Lynnea Ferrier on: 04/14/2018 03:41 PM   Modules accepted: Orders

## 2018-04-14 NOTE — Addendum Note (Signed)
Addended by: Lynnea Ferrier on: 04/14/2018 02:52 PM   Modules accepted: Orders

## 2018-04-14 NOTE — Progress Notes (Addendum)
Subjective:  Patient ID: [name], female    DOB: 30-Mar-1919  Age: 84 y.o. MRN: [id number]  CC: Cystitis   HPI Jilene Spohr Seel presents for evaluation of a 2-day history of urinary frequency that has been intermittent.  It bothers her at sometimes but then not at others.  She denies frank dysuria or urgency.  There is been no vaginal discharge or itching.  There is been no fevers chills nausea vomiting or abdominal pain.  As far she knows her diabetes is well controlled.  Hemoglobin A1c 7 days ago was 7.1.  She had a similar episode 2-1/2 weeks ago and was treated with a seven-day course of cephalexin her symptoms did seem to clear at that time.  History Margit has a past medical history of Anxiety, Cancer ([place]), Diabetes mellitus, type 2 ([place]), Gout, Hyperlipidemia, Hypertension, and Hyperthyroidism.   She has a past surgical history that includes Rotator cuff repair (1999); Abdominal hysterectomy (1995); Cholecystectomy (1976); and Breast lumpectomy (11/06/2011).   Her family history includes Cancer in her mother; Stroke in her brother.She reports that she has never smoked. She has never used smokeless tobacco. She reports that she does not drink alcohol or use drugs.  Outpatient Medications Prior to Visit  Medication Sig Dispense Refill  . acebutolol (SECTRAL) 400 MG capsule Take 1 capsule (400 mg total) by mouth at bedtime. 90 capsule 1  . amLODipine (NORVASC) 5 MG tablet Take 1 tablet (5 mg total) by mouth every morning. 90 tablet 1  . atorvastatin (LIPITOR) 10 MG tablet Take 1 tablet (10 mg total) by mouth at bedtime. 90 tablet 1  . gabapentin (NEURONTIN) 100 MG capsule Take 2 capsules (200 mg total) by mouth 3 (three) times daily. 180 capsule 5  . glimepiride (AMARYL) 2 MG tablet Take 1 tablet (2 mg total) by mouth daily before breakfast. 90 tablet 1  . glucose blood (TRUETRACK TEST) test strip Use as instructed to check blood sugar once a day.  Dx E11.9 100 each 1  . levothyroxine  (SYNTHROID, LEVOTHROID) 88 MCG tablet Take 1 tablet (88 mcg total) by mouth daily. 90 tablet 1  . sitaGLIPtin (JANUVIA) 50 MG tablet Take 1 tablet (50 mg total) by mouth every morning. 90 tablet 1  . telmisartan-hydrochlorothiazide (MICARDIS HCT) 80-12.5 MG tablet Take 1 tablet by mouth every morning. 90 tablet 1  . TRUEPLUS LANCETS 33G MISC Use to check blood sugar once a day. Dx E11.9 100 each 1  . diclofenac sodium (VOLTAREN) 1 % GEL Apply 4 g topically 4 (four) times daily. 100 g 1   No facility-administered medications prior to visit.     ROS Review of Systems  Constitutional: Negative for chills, fatigue, fever and unexpected weight change.  Respiratory: Negative.   Cardiovascular: Negative.   Gastrointestinal: Negative for abdominal pain, nausea and vomiting.  Endocrine: Negative for polyphagia and polyuria.  Genitourinary: Positive for frequency. Negative for difficulty urinating, dysuria, flank pain, vaginal discharge and vaginal pain.  Hematological: Does not bruise/bleed easily.  Psychiatric/Behavioral: Negative.     Objective:  BP 132/76   Pulse 73   Temp 97.8 F (36.6 C)   Ht 5\' 7"  (1.702 m)   Wt 185 lb 8 oz (84.1 kg)   SpO2 93%   BMI 29.05 kg/m   Physical Exam  Constitutional: She is oriented to person, place, and time. She appears well-developed and well-nourished. No distress.  HENT:  Head: Normocephalic and atraumatic.  Right Ear: External ear normal.  Left Ear: External ear normal.  Eyes: Right eye exhibits no discharge. Left eye exhibits no discharge. No scleral icterus.  Cardiovascular: Normal rate, regular rhythm and normal heart sounds.  Pulmonary/Chest: Effort normal and breath sounds normal.  Abdominal: There is no CVA tenderness.  Neurological: She is alert and oriented to person, place, and time.  Skin: She is not diaphoretic.  Psychiatric: She has a normal mood and affect. Her behavior is normal.      Assessment & Plan:   Patrycja was seen  today for cystitis.  Diagnoses and all orders for this visit:  Urinary frequency -     Cancel: Urinalysis, Routine w reflex microscopic -     Cancel: Urine Culture -     nitrofurantoin, macrocrystal-monohydrate, (MACROBID) 100 MG capsule; Take 1 capsule (100 mg total) by mouth 2 (two) times daily for 7 days. -     Cancel: Urinalysis, Routine w reflex microscopic; Future -     Cancel: Urine Culture; Future -     Urine Culture -     Urinalysis, Routine w reflex microscopic -     ciprofloxacin (CIPRO) 500 MG tablet; Take 1 tablet (500 mg total) by mouth 2 (two) times daily for 7 days.   I have discontinued Ramiah Helfrich. Dolby "Elaine"'s diclofenac sodium. I am also having her start on nitrofurantoin (macrocrystal-monohydrate) and ciprofloxacin. Additionally, I am having her maintain her TRUEPLUS LANCETS 33G, acebutolol, amLODipine, atorvastatin, gabapentin, glimepiride, levothyroxine, sitaGLIPtin, telmisartan-hydrochlorothiazide, and glucose blood.  Meds ordered this encounter  Medications  . nitrofurantoin, macrocrystal-monohydrate, (MACROBID) 100 MG capsule    Sig: Take 1 capsule (100 mg total) by mouth 2 (two) times daily for 7 days.    Dispense:  14 capsule    Refill:  0  . ciprofloxacin (CIPRO) 500 MG tablet    Sig: Take 1 tablet (500 mg total) by mouth 2 (two) times daily for 7 days.    Dispense:  14 tablet    Refill:  0     Follow-up: Return in about 1 week (around 04/21/2018), or if symptoms worsen or fail to improve.  [name], MD

## 2018-04-15 LAB — URINALYSIS, ROUTINE W REFLEX MICROSCOPIC
BILIRUBIN URINE: NEGATIVE
Ketones, ur: NEGATIVE
NITRITE: POSITIVE — AB
PH: 7 (ref 5.0–8.0)
Specific Gravity, Urine: 1.01 (ref 1.000–1.030)
Total Protein, Urine: 30 — AB
Urine Glucose: NEGATIVE
Urobilinogen, UA: 0.2 (ref 0.0–1.0)

## 2018-04-17 LAB — URINE CULTURE
MICRO NUMBER: 90640469
SPECIMEN QUALITY:: ADEQUATE

## 2018-04-17 MED ORDER — CIPROFLOXACIN HCL 500 MG PO TABS: 500.0000 mg | ORAL_TABLET | Freq: Two times a day (BID) | ORAL | 0 refills | Status: AC

## 2018-04-17 NOTE — Addendum Note (Signed)
Addended by: Abelino Derrick A on: 04/17/2018 08:00 AM   Modules accepted: Orders

## 2018-04-27 DIAGNOSIS — D485 Neoplasm of uncertain behavior of skin: Secondary | ICD-10-CM | POA: Diagnosis not present

## 2018-04-27 DIAGNOSIS — Z85828 Personal history of other malignant neoplasm of skin: Secondary | ICD-10-CM | POA: Diagnosis not present

## 2018-04-27 DIAGNOSIS — L570 Actinic keratosis: Secondary | ICD-10-CM | POA: Diagnosis not present

## 2018-04-27 DIAGNOSIS — L821 Other seborrheic keratosis: Secondary | ICD-10-CM | POA: Diagnosis not present

## 2018-04-27 DIAGNOSIS — C44319 Basal cell carcinoma of skin of other parts of face: Secondary | ICD-10-CM | POA: Diagnosis not present

## 2018-04-29 DIAGNOSIS — C44319 Basal cell carcinoma of skin of other parts of face: Secondary | ICD-10-CM | POA: Diagnosis not present

## 2018-06-03 ENCOUNTER — Ambulatory Visit: Payer: Medicare Other | Admitting: Podiatry

## 2018-06-22 DIAGNOSIS — H353122 Nonexudative age-related macular degeneration, left eye, intermediate dry stage: Secondary | ICD-10-CM | POA: Diagnosis not present

## 2018-06-22 DIAGNOSIS — H353111 Nonexudative age-related macular degeneration, right eye, early dry stage: Secondary | ICD-10-CM | POA: Diagnosis not present

## 2018-07-09 ENCOUNTER — Encounter: Payer: Self-pay | Admitting: Family Medicine

## 2018-07-09 ENCOUNTER — Ambulatory Visit (INDEPENDENT_AMBULATORY_CARE_PROVIDER_SITE_OTHER): Payer: Medicare Other | Admitting: Family Medicine

## 2018-07-09 VITALS — BP 124/80 | HR 86 | Ht 67.0 in | Wt 183.0 lb

## 2018-07-09 DIAGNOSIS — E039 Hypothyroidism, unspecified: Secondary | ICD-10-CM | POA: Diagnosis not present

## 2018-07-09 DIAGNOSIS — E785 Hyperlipidemia, unspecified: Secondary | ICD-10-CM | POA: Diagnosis not present

## 2018-07-09 DIAGNOSIS — N183 Chronic kidney disease, stage 3 unspecified: Secondary | ICD-10-CM

## 2018-07-09 DIAGNOSIS — E119 Type 2 diabetes mellitus without complications: Secondary | ICD-10-CM

## 2018-07-09 DIAGNOSIS — G6289 Other specified polyneuropathies: Secondary | ICD-10-CM | POA: Diagnosis not present

## 2018-07-09 DIAGNOSIS — I10 Essential (primary) hypertension: Secondary | ICD-10-CM

## 2018-07-09 LAB — COMPREHENSIVE METABOLIC PANEL
ALT: 11 U/L (ref 0–35)
AST: 13 U/L (ref 0–37)
Albumin: 4.4 g/dL (ref 3.5–5.2)
Alkaline Phosphatase: 68 U/L (ref 39–117)
BUN: 39 mg/dL — ABNORMAL HIGH (ref 6–23)
CALCIUM: 10 mg/dL (ref 8.4–10.5)
CO2: 28 mEq/L (ref 19–32)
Chloride: 98 mEq/L (ref 96–112)
Creatinine, Ser: 1.54 mg/dL — ABNORMAL HIGH (ref 0.40–1.20)
GFR: 33.14 mL/min — AB (ref >60.00)
Glucose, Bld: 121 mg/dL — ABNORMAL HIGH (ref 70–99)
POTASSIUM: 4.6 meq/L (ref 3.5–5.1)
Sodium: 135 mEq/L (ref 135–145)
TOTAL PROTEIN: 7.3 g/dL (ref 6.0–8.3)
Total Bilirubin: 0.6 mg/dL (ref 0.2–1.2)

## 2018-07-09 LAB — CBC
HEMATOCRIT: 39.2 % (ref 36.0–46.0)
HEMOGLOBIN: 13.5 g/dL (ref 12.0–15.0)
MCHC: 34.4 g/dL (ref 30.0–36.0)
MCV: 98.3 fl (ref 78.0–100.0)
PLATELETS: 227 10*3/uL (ref 150.0–400.0)
RBC: 3.99 Mil/uL (ref 3.87–5.11)
RDW: 12.4 % (ref 11.5–15.5)
WBC: 8.5 10*3/uL (ref 4.0–10.5)

## 2018-07-09 LAB — TSH: TSH: 1.92 u[IU]/mL (ref 0.35–4.50)

## 2018-07-09 LAB — LDL CHOLESTEROL, DIRECT: Direct LDL: 78 mg/dL

## 2018-07-09 LAB — HEMOGLOBIN A1C: HEMOGLOBIN A1C: 6.8 % — AB (ref 4.6–6.5)

## 2018-07-09 MED ORDER — LEVOTHYROXINE SODIUM 88 MCG PO TABS: 88.0000 ug | ORAL_TABLET | Freq: Every day | ORAL | 1 refills | Status: DC

## 2018-07-09 MED ORDER — SITAGLIPTIN PHOSPHATE 50 MG PO TABS: 50.0000 mg | ORAL_TABLET | ORAL | 1 refills | Status: DC

## 2018-07-09 MED ORDER — TRUEPLUS LANCETS 33G MISC: 1 refills | Status: DC

## 2018-07-09 MED ORDER — ATORVASTATIN CALCIUM 10 MG PO TABS: 10.0000 mg | ORAL_TABLET | Freq: Every day | ORAL | 1 refills | Status: DC

## 2018-07-09 MED ORDER — ACEBUTOLOL HCL 400 MG PO CAPS: 400.0000 mg | ORAL_CAPSULE | Freq: Every day | ORAL | 1 refills | Status: DC

## 2018-07-09 MED ORDER — GLUCOSE BLOOD VI STRP: ORAL_STRIP | 1 refills | Status: DC

## 2018-07-09 MED ORDER — GLIMEPIRIDE 2 MG PO TABS: 2.0000 mg | ORAL_TABLET | Freq: Every day | ORAL | 1 refills | Status: DC

## 2018-07-09 MED ORDER — TELMISARTAN-HCTZ 80-12.5 MG PO TABS: 1.0000 | ORAL_TABLET | ORAL | 1 refills | Status: DC

## 2018-07-09 MED ORDER — AMLODIPINE BESYLATE 5 MG PO TABS: 5.0000 mg | ORAL_TABLET | ORAL | 1 refills | Status: DC

## 2018-07-09 MED ORDER — GABAPENTIN 100 MG PO CAPS: 200.0000 mg | ORAL_CAPSULE | Freq: Three times a day (TID) | ORAL | 5 refills | Status: DC

## 2018-07-09 NOTE — Progress Notes (Signed)
Subjective:  Patient ID: [name], female    DOB: [birth year]  Age: 84 y.o. MRN: [id number]  CC: Establish Care   HPI [name] presents for follow-up of multiple medical issues and establishment of care with me.  Hypertension has been well controlled on her current regimen of Sectral and amlodipine and Micardis.  Diabetes has been controlled with Amaryl real and Januvia.  Patient is accompanied by her daughter and they both assure me that she does eat directly after taking her Amaryl.  I stressed the importance of this.  She has been on this medicine for years with acceptable A1c's and significant chronic kidney disease.  She does take her thyroid medicine in the morning.  She has no issues with atorvastatin and her LDLs have been in an acceptable range.  She complains about a burning stinging feeling in both of her feet.  Neurontin does make her drowsy.  Outpatient Medications Prior to Visit  Medication Sig Dispense Refill  . acebutolol (SECTRAL) 400 MG capsule Take 1 capsule (400 mg total) by mouth at bedtime. 90 capsule 1  . amLODipine (NORVASC) 5 MG tablet Take 1 tablet (5 mg total) by mouth every morning. 90 tablet 1  . atorvastatin (LIPITOR) 10 MG tablet Take 1 tablet (10 mg total) by mouth at bedtime. 90 tablet 1  . gabapentin (NEURONTIN) 100 MG capsule Take 2 capsules (200 mg total) by mouth 3 (three) times daily. 180 capsule 5  . glimepiride (AMARYL) 2 MG tablet Take 1 tablet (2 mg total) by mouth daily before breakfast. 90 tablet 1  . glucose blood (TRUETRACK TEST) test strip Use as instructed to check blood sugar once a day.  Dx E11.9 100 each 1  . levothyroxine (SYNTHROID, LEVOTHROID) 88 MCG tablet Take 1 tablet (88 mcg total) by mouth daily. 90 tablet 1  . sitaGLIPtin (JANUVIA) 50 MG tablet Take 1 tablet (50 mg total) by mouth every morning. 90 tablet 1  . telmisartan-hydrochlorothiazide (MICARDIS HCT) 80-12.5 MG tablet Take 1 tablet by mouth every morning. 90 tablet 1  .  TRUEPLUS LANCETS 33G MISC Use to check blood sugar once a day. Dx E11.9 100 each 1   No facility-administered medications prior to visit.     ROS Review of Systems  Constitutional: Negative for chills, fatigue, fever and unexpected weight change.  HENT: Negative.   Eyes: Negative for photophobia and visual disturbance.  Respiratory: Negative for cough, chest tightness and shortness of breath.   Cardiovascular: Negative for chest pain and leg swelling.  Gastrointestinal: Negative.   Endocrine: Negative for polyphagia and polyuria.  Genitourinary: Negative for decreased urine volume and difficulty urinating.  Musculoskeletal: Positive for gait problem. Negative for joint swelling.  Skin: Negative for pallor and rash.  Allergic/Immunologic: Negative for immunocompromised state.  Neurological: Positive for numbness. Negative for speech difficulty and headaches.  Hematological: Negative.   Psychiatric/Behavioral: Negative for agitation, behavioral problems and confusion.    Objective:  BP 124/80   Pulse 86   Ht 5\' 7"  (1.702 m)   Wt 183 lb (83 kg)   SpO2 96%   BMI 28.66 kg/m   BP Readings from Last 3 Encounters:  07/09/18 124/80  04/14/18 132/76  04/07/18 (!) 147/65    Wt Readings from Last 3 Encounters:  07/09/18 183 lb (83 kg)  04/14/18 185 lb 8 oz (84.1 kg)  04/07/18 186 lb (84.4 kg)    Physical Exam  Constitutional: She is oriented to person, place, and time. She  appears well-developed and well-nourished. No distress.  HENT:  Head: Normocephalic and atraumatic.  Right Ear: External ear normal.  Left Ear: External ear normal.  Eyes: Right eye exhibits no discharge. Left eye exhibits no discharge. No scleral icterus.  Neck: No JVD present. No tracheal deviation present.  Cardiovascular: Normal rate, regular rhythm and normal heart sounds.  Pulmonary/Chest: Effort normal and breath sounds normal.  Abdominal: Bowel sounds are normal.  Neurological: She is alert and  oriented to person, place, and time.  Skin: Skin is warm and dry. She is not diaphoretic.  Psychiatric: She has a normal mood and affect. Her behavior is normal.    Lab Results  Component Value Date   WBC 8.8 01/04/2016   HGB 13.3 01/04/2016   HCT 39.5 01/04/2016   PLT 238 01/04/2016   GLUCOSE 113 (H) 04/07/2018   CHOL 166 04/07/2018   TRIG 368.0 (H) 04/07/2018   HDL 42.50 04/07/2018   LDLDIRECT 76.0 04/07/2018   LDLCALC 93 03/12/2016   ALT 16 01/04/2016   AST 17 01/04/2016   NA 138 04/07/2018   K 5.0 04/07/2018   CL 101 04/07/2018   CREATININE 1.47 (H) 04/07/2018   BUN 36 (H) 04/07/2018   CO2 28 04/07/2018   TSH 1.57 01/07/2018   HGBA1C 7.1 (H) 04/07/2018   MICROALBUR 9.9 (H) 04/23/2016    Dg Lumbar Spine Complete  Result Date: 07/05/2016 CLINICAL DATA:  Intermittent low back pain for the past 3 weeks without known injury. No radicular symptoms of the patient is complaining of posterior right hip pain. EXAM: LUMBAR SPINE - COMPLETE 4+ VIEW COMPARISON:  None in PACs FINDINGS: The bones are subjectively mildly osteopenic. There is gentle levocurvature centered at L2-3. The pedicles and transverse processes are intact where visualized. The intervertebral disc space heights are mildly decreased at L1 and L2. There is no spondylolisthesis. There is small anterior endplate osteophytes at L1-2 and at L2-3. The observed portions of the sacrum are normal. There is dense calcification in the wall of the abdominal aorta without evidence of aneurysm. IMPRESSION: Mild degenerative disc disease centered at L1-2 and L2-3. Mild facet joint hypertrophy at L5-S1. No acute bony abnormality. Aortic atherosclerosis. Electronically Signed   By: David  Martinique M.D.   On: 07/05/2016 12:15   Dg Hip Unilat W Or W/o Pelvis 2-3 Views Right  Result Date: 07/05/2016 CLINICAL DATA:  Right posterior hip pain. EXAM: DG HIP (WITH OR WITHOUT PELVIS) 2-3V RIGHT COMPARISON:  None in PACs FINDINGS: The bones are  subjectively mildly osteopenic. There is no lytic or blastic lesion nor evidence of an acute fracture. The hip joint space is preserved. The articular surfaces of the right femoral head and acetabulum remain smoothly rounded. The femoral neck, intertrochanteric, and subtrochanteric regions are normal. IMPRESSION: There is no acute or significant chronic bony abnormality of the right hip. Electronically Signed   By: David  Martinique M.D.   On: 07/05/2016 12:16    Assessment & Plan:   [name] was seen today for establish care.  Diagnoses and all orders for this visit:  Essential hypertension -     acebutolol (SECTRAL) 400 MG capsule; Take 1 capsule (400 mg total) by mouth at bedtime. -     amLODipine (NORVASC) 5 MG tablet; Take 1 tablet (5 mg total) by mouth every morning. -     telmisartan-hydrochlorothiazide (MICARDIS HCT) 80-12.5 MG tablet; Take 1 tablet by mouth every morning. -     CBC -     Comprehensive  metabolic panel  Hypothyroidism, unspecified type -     levothyroxine (SYNTHROID, LEVOTHROID) 88 MCG tablet; Take 1 tablet (88 mcg total) by mouth daily. -     TSH  Controlled type 2 diabetes mellitus without complication, without long-term current use of insulin (HCC) -     glimepiride (AMARYL) 2 MG tablet; Take 1 tablet (2 mg total) by mouth daily before breakfast. -     glucose blood (TRUETRACK TEST) test strip; Use as instructed to check blood sugar once a day.  Dx E11.9 -     sitaGLIPtin (JANUVIA) 50 MG tablet; Take 1 tablet (50 mg total) by mouth every morning. -     TRUEPLUS LANCETS 33G MISC; Use to check blood sugar once a day. Dx E11.9 -     CBC -     Comprehensive metabolic panel -     Hemoglobin A1c  Other polyneuropathy -     gabapentin (NEURONTIN) 100 MG capsule; Take 2 capsules (200 mg total) by mouth 3 (three) times daily.  Hyperlipidemia, unspecified hyperlipidemia type -     atorvastatin (LIPITOR) 10 MG tablet; Take 1 tablet (10 mg total) by mouth at bedtime. -      Comprehensive metabolic panel -     LDL cholesterol, direct  CKD (chronic kidney disease) stage 3, GFR 30-59 ml/min (HCC) -     Comprehensive metabolic panel   I am having Cheryl Pikes. Ndiaye "Margaretha Sheffield" maintain her acebutolol, amLODipine, atorvastatin, gabapentin, glimepiride, glucose blood, levothyroxine, sitaGLIPtin, telmisartan-hydrochlorothiazide, and TRUEPLUS LANCETS 33G.  Meds ordered this encounter  Medications  . acebutolol (SECTRAL) 400 MG capsule    Sig: Take 1 capsule (400 mg total) by mouth at bedtime.    Dispense:  90 capsule    Refill:  1    Supervising MD:  Penni Homans   DEA: [id number]  . amLODipine (NORVASC) 5 MG tablet    Sig: Take 1 tablet (5 mg total) by mouth every morning.    Dispense:  90 tablet    Refill:  1    Supervising MD:  Penni Homans   DEA: [id number]  . atorvastatin (LIPITOR) 10 MG tablet    Sig: Take 1 tablet (10 mg total) by mouth at bedtime.    Dispense:  90 tablet    Refill:  1  . gabapentin (NEURONTIN) 100 MG capsule    Sig: Take 2 capsules (200 mg total) by mouth 3 (three) times daily.    Dispense:  180 capsule    Refill:  5  . glimepiride (AMARYL) 2 MG tablet    Sig: Take 1 tablet (2 mg total) by mouth daily before breakfast.    Dispense:  90 tablet    Refill:  1    PLEASE ALLOW 7 DAY SUPPLY UNTIL PT RECEIVES MAIL ORDER. MAY BE WILLING TO PAY OUT OF POCKET.  [name] glucose blood (TRUETRACK TEST) test strip    Sig: Use as instructed to check blood sugar once a day.  Dx E11.9    Dispense:  100 each    Refill:  1    Supervising MD:  Penni Homans   DEA: [id number]  . levothyroxine (SYNTHROID, LEVOTHROID) 88 MCG tablet    Sig: Take 1 tablet (88 mcg total) by mouth daily.    Dispense:  90 tablet    Refill:  1  . sitaGLIPtin (JANUVIA) 50 MG tablet    Sig: Take 1 tablet (50 mg total) by mouth every morning.    Dispense:  90 tablet  Refill:  1  . telmisartan-hydrochlorothiazide (MICARDIS HCT) 80-12.5 MG tablet    Sig: Take 1 tablet by mouth every  morning.    Dispense:  90 tablet    Refill:  1    Supervising MD:  Penni Homans   DEA: [id number]  . TRUEPLUS LANCETS 33G MISC    Sig: Use to check blood sugar once a day. Dx E11.9    Dispense:  100 each    Refill:  1    Supervising MD:  Penni Homans   DEA: [id number]   Please dispense lancet appropriate for truetrack meter. Thanks!   Will continue current medical regimen.  Again stressed the importance of eating directly after taking Amaryl.  Encouraged patient to use a walker for all of her ambulation.  Her daughter assures me that she is planning to choose from.  Had consider decreasing the dose of Sectral all secondary to current GFR of pulse rate is 86 and will leave it where it is for continued rate control.  Encouraged patient to continue hydration with lemonade with Crystal light.  Follow-up: Return in about 3 months (around 10/09/2018).  [name], MD

## 2018-07-31 ENCOUNTER — Inpatient Hospital Stay (HOSPITAL_COMMUNITY)
Admission: EM | Admit: 2018-07-31 | Discharge: 2018-08-02 | DRG: 872 | Disposition: A | Discharge status: Home / Self Care | Payer: Medicare Other | Attending: Internal Medicine | Admitting: Internal Medicine

## 2018-07-31 ENCOUNTER — Encounter (HOSPITAL_COMMUNITY): Payer: Self-pay

## 2018-07-31 ENCOUNTER — Other Ambulatory Visit: Payer: Self-pay

## 2018-07-31 ENCOUNTER — Emergency Department (HOSPITAL_COMMUNITY): Payer: Medicare Other

## 2018-07-31 DIAGNOSIS — E785 Hyperlipidemia, unspecified: Secondary | ICD-10-CM | POA: Diagnosis present

## 2018-07-31 DIAGNOSIS — A419 Sepsis, unspecified organism: Secondary | ICD-10-CM | POA: Diagnosis present

## 2018-07-31 DIAGNOSIS — I119 Hypertensive heart disease without heart failure: Secondary | ICD-10-CM | POA: Diagnosis present

## 2018-07-31 DIAGNOSIS — Z7984 Long term (current) use of oral hypoglycemic drugs: Secondary | ICD-10-CM | POA: Diagnosis not present

## 2018-07-31 DIAGNOSIS — N12 Tubulo-interstitial nephritis, not specified as acute or chronic: Secondary | ICD-10-CM | POA: Diagnosis not present

## 2018-07-31 DIAGNOSIS — N179 Acute kidney failure, unspecified: Secondary | ICD-10-CM | POA: Diagnosis present

## 2018-07-31 DIAGNOSIS — R945 Abnormal results of liver function studies: Secondary | ICD-10-CM | POA: Diagnosis not present

## 2018-07-31 DIAGNOSIS — R0902 Hypoxemia: Secondary | ICD-10-CM | POA: Diagnosis not present

## 2018-07-31 DIAGNOSIS — N390 Urinary tract infection, site not specified: Secondary | ICD-10-CM | POA: Diagnosis present

## 2018-07-31 DIAGNOSIS — N183 Chronic kidney disease, stage 3 unspecified: Secondary | ICD-10-CM

## 2018-07-31 DIAGNOSIS — R000 Tachycardia, unspecified: Secondary | ICD-10-CM | POA: Diagnosis not present

## 2018-07-31 DIAGNOSIS — E039 Hypothyroidism, unspecified: Secondary | ICD-10-CM | POA: Diagnosis present

## 2018-07-31 DIAGNOSIS — R0689 Other abnormalities of breathing: Secondary | ICD-10-CM | POA: Diagnosis not present

## 2018-07-31 DIAGNOSIS — E876 Hypokalemia: Secondary | ICD-10-CM | POA: Diagnosis present

## 2018-07-31 DIAGNOSIS — R509 Fever, unspecified: Secondary | ICD-10-CM | POA: Diagnosis not present

## 2018-07-31 DIAGNOSIS — Z79899 Other long term (current) drug therapy: Secondary | ICD-10-CM | POA: Diagnosis not present

## 2018-07-31 DIAGNOSIS — I10 Essential (primary) hypertension: Secondary | ICD-10-CM

## 2018-07-31 DIAGNOSIS — R531 Weakness: Secondary | ICD-10-CM | POA: Diagnosis not present

## 2018-07-31 DIAGNOSIS — E119 Type 2 diabetes mellitus without complications: Secondary | ICD-10-CM | POA: Diagnosis present

## 2018-07-31 DIAGNOSIS — I340 Nonrheumatic mitral (valve) insufficiency: Secondary | ICD-10-CM | POA: Diagnosis not present

## 2018-07-31 DIAGNOSIS — H409 Unspecified glaucoma: Secondary | ICD-10-CM | POA: Diagnosis present

## 2018-07-31 DIAGNOSIS — A4189 Other specified sepsis: Principal | ICD-10-CM | POA: Diagnosis present

## 2018-07-31 LAB — CBC WITH DIFFERENTIAL/PLATELET
BASOS PCT: 0 %
Basophils Absolute: 0 10*3/uL (ref 0.0–0.1)
EOS ABS: 0 10*3/uL (ref 0.0–0.7)
EOS PCT: 0 %
HCT: 38.5 % (ref 36.0–46.0)
Hemoglobin: 13.1 g/dL (ref 12.0–15.0)
Lymphocytes Relative: 3 %
Lymphs Abs: 0.3 10*3/uL — ABNORMAL LOW (ref 0.7–4.0)
MCH: 33.2 pg (ref 26.0–34.0)
MCHC: 34 g/dL (ref 30.0–36.0)
MCV: 97.5 fL (ref 78.0–100.0)
MONO ABS: 0.3 10*3/uL (ref 0.1–1.0)
Monocytes Relative: 3 %
Neutro Abs: 10 10*3/uL — ABNORMAL HIGH (ref 1.7–7.7)
Neutrophils Relative %: 94 %
Platelets: 210 10*3/uL (ref 150–400)
RBC: 3.95 MIL/uL (ref 3.87–5.11)
RDW: 12 % (ref 11.5–15.5)
WBC: 10.7 10*3/uL — ABNORMAL HIGH (ref 4.0–10.5)

## 2018-07-31 LAB — URINALYSIS, ROUTINE W REFLEX MICROSCOPIC
BILIRUBIN URINE: NEGATIVE
Glucose, UA: NEGATIVE mg/dL
Ketones, ur: NEGATIVE mg/dL
Nitrite: POSITIVE — AB
Protein, ur: 100 mg/dL — AB
Specific Gravity, Urine: 1.013 (ref 1.005–1.030)
pH: 6 (ref 5.0–8.0)

## 2018-07-31 LAB — COMPREHENSIVE METABOLIC PANEL
ALK PHOS: 56 U/L (ref 38–126)
ALT: 32 U/L (ref 0–44)
ANION GAP: 12 (ref 5–15)
AST: 42 U/L — ABNORMAL HIGH (ref 15–41)
Albumin: 4.2 g/dL (ref 3.5–5.0)
BUN: 27 mg/dL — ABNORMAL HIGH (ref 8–23)
CALCIUM: 9.4 mg/dL (ref 8.9–10.3)
CO2: 23 mmol/L (ref 22–32)
Chloride: 101 mmol/L (ref 98–111)
Creatinine, Ser: 1.36 mg/dL — ABNORMAL HIGH (ref 0.44–1.00)
GFR calc Af Amer: 37 mL/min — ABNORMAL LOW (ref >60)
GFR, EST NON AFRICAN AMERICAN: 32 mL/min — AB (ref >60)
Glucose, Bld: 231 mg/dL — ABNORMAL HIGH (ref 70–99)
Potassium: 3.4 mmol/L — ABNORMAL LOW (ref 3.5–5.1)
Sodium: 136 mmol/L (ref 135–145)
TOTAL PROTEIN: 7.3 g/dL (ref 6.5–8.1)
Total Bilirubin: 0.7 mg/dL (ref 0.3–1.2)

## 2018-07-31 LAB — PROTIME-INR
INR: 0.99
Prothrombin Time: 13 seconds (ref 11.4–15.2)

## 2018-07-31 LAB — GLUCOSE, CAPILLARY: GLUCOSE-CAPILLARY: 148 mg/dL — AB (ref 70–99)

## 2018-07-31 LAB — I-STAT CG4 LACTIC ACID, ED
LACTIC ACID, VENOUS: 3.69 mmol/L — AB (ref 0.5–1.9)
Lactic Acid, Venous: 2.8 mmol/L (ref 0.5–1.9)

## 2018-07-31 MED ORDER — ATORVASTATIN CALCIUM 10 MG PO TABS
10.0000 mg | ORAL_TABLET | Freq: Every day | ORAL | Status: DC
  Administered 2018-07-31: 10 mg via ORAL
  Filled 2018-07-31: qty 1

## 2018-07-31 MED ORDER — INSULIN ASPART 100 UNIT/ML ~~LOC~~ SOLN
0.0000 [IU] | Freq: Three times a day (TID) | SUBCUTANEOUS | Status: DC
  Administered 2018-08-01 – 2018-08-02 (×3): 1 [IU] via SUBCUTANEOUS
  Administered 2018-08-02: 2 [IU] via SUBCUTANEOUS

## 2018-07-31 MED ORDER — HYDROCHLOROTHIAZIDE 12.5 MG PO CAPS
12.5000 mg | ORAL_CAPSULE | Freq: Every day | ORAL | Status: DC
  Administered 2018-08-01: 12.5 mg via ORAL
  Filled 2018-07-31: qty 1

## 2018-07-31 MED ORDER — SODIUM CHLORIDE 0.9 % IV SOLN
INTRAVENOUS | Status: AC
  Administered 2018-07-31: 23:00:00 via INTRAVENOUS

## 2018-07-31 MED ORDER — SODIUM CHLORIDE 0.9 % IV SOLN
1.0000 g | Freq: Once | INTRAVENOUS | Status: AC
  Administered 2018-07-31: 1 g via INTRAVENOUS
  Filled 2018-07-31: qty 1

## 2018-07-31 MED ORDER — ACEBUTOLOL HCL 200 MG PO CAPS
400.0000 mg | ORAL_CAPSULE | Freq: Every day | ORAL | Status: DC
  Administered 2018-07-31 – 2018-08-01 (×2): 400 mg via ORAL
  Filled 2018-07-31 (×2): qty 2

## 2018-07-31 MED ORDER — INSULIN ASPART 100 UNIT/ML ~~LOC~~ SOLN: 0.0000 [IU] | Freq: Every day | SUBCUTANEOUS | Status: DC

## 2018-07-31 MED ORDER — TELMISARTAN-HCTZ 80-12.5 MG PO TABS: 1.0000 | ORAL_TABLET | ORAL | Status: DC

## 2018-07-31 MED ORDER — IRBESARTAN 300 MG PO TABS
300.0000 mg | ORAL_TABLET | Freq: Every day | ORAL | Status: DC
  Administered 2018-08-01: 300 mg via ORAL
  Filled 2018-07-31: qty 1

## 2018-07-31 MED ORDER — LEVOTHYROXINE SODIUM 88 MCG PO TABS
88.0000 ug | ORAL_TABLET | Freq: Every day | ORAL | Status: DC
  Administered 2018-08-01 – 2018-08-02 (×2): 88 ug via ORAL
  Filled 2018-07-31 (×2): qty 1

## 2018-07-31 MED ORDER — ENOXAPARIN SODIUM 30 MG/0.3ML ~~LOC~~ SOLN
30.0000 mg | Freq: Every day | SUBCUTANEOUS | Status: DC
  Filled 2018-07-31: qty 0.3

## 2018-07-31 MED ORDER — ACETAMINOPHEN 650 MG RE SUPP: 650.0000 mg | Freq: Four times a day (QID) | RECTAL | Status: DC | PRN

## 2018-07-31 MED ORDER — ACETAMINOPHEN 500 MG PO TABS
1000.0000 mg | ORAL_TABLET | Freq: Once | ORAL | Status: AC
  Administered 2018-07-31: 1000 mg via ORAL
  Filled 2018-07-31: qty 2

## 2018-07-31 MED ORDER — ACETAMINOPHEN 325 MG PO TABS
650.0000 mg | ORAL_TABLET | Freq: Four times a day (QID) | ORAL | Status: DC | PRN
  Administered 2018-08-01: 650 mg via ORAL
  Filled 2018-07-31: qty 2

## 2018-07-31 MED ORDER — GABAPENTIN 100 MG PO CAPS
200.0000 mg | ORAL_CAPSULE | Freq: Three times a day (TID) | ORAL | Status: DC
  Administered 2018-07-31 – 2018-08-02 (×5): 200 mg via ORAL
  Filled 2018-07-31 (×5): qty 2

## 2018-07-31 MED ORDER — AMLODIPINE BESYLATE 5 MG PO TABS
5.0000 mg | ORAL_TABLET | Freq: Every day | ORAL | Status: DC
  Administered 2018-08-01 – 2018-08-02 (×2): 5 mg via ORAL
  Filled 2018-07-31 (×2): qty 1

## 2018-07-31 MED ORDER — SODIUM CHLORIDE 0.9 % IV BOLUS
1000.0000 mL | Freq: Once | INTRAVENOUS | Status: AC
  Administered 2018-07-31: 1000 mL via INTRAVENOUS

## 2018-07-31 MED ORDER — LINAGLIPTIN 5 MG PO TABS
5.0000 mg | ORAL_TABLET | Freq: Every day | ORAL | Status: DC
  Administered 2018-08-01 – 2018-08-02 (×2): 5 mg via ORAL
  Filled 2018-07-31 (×2): qty 1

## 2018-07-31 MED ORDER — SODIUM CHLORIDE 0.9 % IV SOLN
1.0000 g | Freq: Two times a day (BID) | INTRAVENOUS | Status: DC
  Administered 2018-08-01 – 2018-08-02 (×3): 1 g via INTRAVENOUS
  Filled 2018-07-31 (×4): qty 1

## 2018-07-31 NOTE — ED Triage Notes (Signed)
Pt arrived via EMS from home. Pt dtr called reports that she has some AMS  X 1 day and is more confused. Pt had a temp of 103. Pt reports oliguria and urinary frequency  22 left hand. W/ 500cc NS bolus   BP 154/80, CBG 228, O2 sat 93% RR 24,

## 2018-07-31 NOTE — H&P (Signed)
TRH H&P   Patient Demographics:    [name], is a 84 y.o. female  MRN: [id number]   DOB - January 17, 1919  Admit Date - 07/31/2018  Outpatient Primary MD for the patient is [name], MD  Referring MD/NP/PA: Deno Etienne  Outpatient Specialists:   Patient coming from: home  Chief Complaint  Patient presents with  . Urinary Tract Infection  . Code Sepsis      HPI:    [name]  is a 84 y.o. female, w hypertension, hyperlipidemia, Dm2,  Hypothyroidism, gout, apparently c/o chills this am as well as dysuria. Pt had subjective fever as well.  Denies cough, cp, palp, sob, n/v, diarrhea, brbpr, hematuria, flank pain.  Pt presented to ED due to her daughter calling EMS.   In Ed,  T 99.1 (TMAX 103.8) P 73-91,  RR 29  Bp 140/59  Pox 94  CXR IMPRESSION: No acute cardiopulmonary disease.  Cardiomegaly without congestive failure.  Urinalysis  Nitritate +, LE + Wbc >50, rbc 6-10  Her prior culture grew out pseudomonas in the past  Na 136, K 3.4 Bun 27, Creatinine 1.36 Ast 42, Alt 32, Alk phos 56, T. Bili 0.7 Wbc 10.7, hgb 13.1, Plt 210 INR 0.99  Blood culture x2 pending  Lactic acid 3.69=> 2.80  Pt will be admitted for sepsis (fever, chills, leukocytosis, elevated lactic acid 3.69) secondary to UTI, and hypokalemia, abnromal liver function, and  mild AKI, renal insufficiency.      Review of systems:    In addition to the HPI above,  No Headache, No changes with Vision or hearing, No problems swallowing food or Liquids, No Chest pain, Cough or Shortness of Breath, No Abdominal pain, No Nausea or Vommitting, Bowel movements are regular, No Blood in stool or Urine,  No new skin rashes or bruises, No new joints pains-aches,  No new weakness, tingling, numbness in any extremity, No recent weight gain or loss, No polyuria, polydypsia or polyphagia, No  significant Mental Stressors.  A full 10 point Review of Systems was done, except as stated above, all other Review of Systems were negative.   With Past History of the following :    Past Medical History:  Diagnosis Date  . Anxiety    loss of child 1 year ago  . Cancer ([place])    right breast  . Diabetes mellitus, type 2 ([place])   . Glaucoma   . Gout   . Hyperlipidemia   . Hypertension    states eccho Dr Mare Ferrari 1 year ago- no record found in [facility] or office records  . Hyperthyroidism       Past Surgical History:  Procedure Laterality Date  . ABDOMINAL HYSTERECTOMY  1995  . BREAST LUMPECTOMY  11/06/2011   Procedure: LUMPECTOMY;  Surgeon: Harl Bowie, MD;  Location: [facility];  Service: General;  Laterality: Right;  .  CHOLECYSTECTOMY  1976  . ROTATOR CUFF REPAIR  1999   right      Social History:     Social History   Tobacco Use  . Smoking status: Never Smoker  . Smokeless tobacco: Never Used  Substance Use Topics  . Alcohol use: No     Lives - at home  Mobility - walks by self   Family History :     Family History  Problem Relation Age of Onset  . Cancer Mother        bladder  . Kidney disease Mother   . Heart attack Brother   . Stroke Father   . Hypertension Sister   . Hyperlipidemia Daughter   . Diabetes Sister       Home Medications:   Prior to Admission medications   Medication Sig Start Date End Date Taking? Authorizing Provider  acebutolol (SECTRAL) 400 MG capsule Take 1 capsule (400 mg total) by mouth at bedtime. 07/09/18   [name], MD  amLODipine (NORVASC) 5 MG tablet Take 1 tablet (5 mg total) by mouth every morning. 07/09/18   [name], MD  atorvastatin (LIPITOR) 10 MG tablet Take 1 tablet (10 mg total) by mouth at bedtime. 07/09/18   [name], MD  gabapentin (NEURONTIN) 100 MG capsule Take 2 capsules (200 mg total) by mouth 3 (three) times daily. 07/09/18   [name], MD    glimepiride (AMARYL) 2 MG tablet Take 1 tablet (2 mg total) by mouth daily before breakfast. 07/09/18   [name], MD  glucose blood (TRUETRACK TEST) test strip Use as instructed to check blood sugar once a day.  Dx E11.9 07/09/18   [name], MD  levothyroxine (SYNTHROID, LEVOTHROID) 88 MCG tablet Take 1 tablet (88 mcg total) by mouth daily. 07/09/18   [name], MD  sitaGLIPtin (JANUVIA) 50 MG tablet Take 1 tablet (50 mg total) by mouth every morning. 07/09/18   [name], MD  telmisartan-hydrochlorothiazide (MICARDIS HCT) 80-12.5 MG tablet Take 1 tablet by mouth every morning. 07/09/18   [name], MD  TRUEPLUS LANCETS 33G MISC Use to check blood sugar once a day. Dx E11.9 07/09/18   [name], MD     Allergies:    No Known Allergies   Physical Exam:   Vitals  Blood pressure 138/77, pulse 87, temperature (!) 103.8 F (39.9 C), temperature source Rectal, resp. rate 17, SpO2 96 %.   1. General lying in bed in with chills.   2. Normal affect and insight, Not Suicidal or Homicidal, Awake Alert, Oriented X 3.  3. No F.N deficits, ALL C.Nerves Intact, Strength 5/5 all 4 extremities, Sensation intact all 4 extremities, Plantars down going.  4. Ears and Eyes appear Normal, Conjunctivae clear, PERRLA. Moist Oral Mucosa.  5. Supple Neck, No JVD, No cervical lymphadenopathy appriciated, No Carotid Bruits.  6. Symmetrical Chest wall movement, Good air movement bilaterally, CTAB.  7. RRR,s1, s2, 1/6 sem rusb  8. Positive Bowel Sounds, Abdomen Soft, No tenderness, No organomegaly appriciated,No rebound -guarding or rigidity.  9.  No Cyanosis, Normal Skin Turgor, No Skin Rash or Bruise.  10. Good muscle tone,  joints appear normal , no effusions, Normal ROM.  11. No Palpable Lymph Nodes in Neck or Axillae  No cva tenderness    Data Review:    CBC Recent Labs  Lab 07/31/18 1911  WBC 10.7*  HGB 13.1  HCT  38.5  PLT  210  MCV 97.5  MCH 33.2  MCHC 34.0  RDW 12.0  LYMPHSABS 0.3*  MONOABS 0.3  EOSABS 0.0  BASOSABS 0.0   ------------------------------------------------------------------------------------------------------------------  Chemistries  Recent Labs  Lab 07/31/18 1911  NA 136  K 3.4*  CL 101  CO2 23  GLUCOSE 231*  BUN 27*  CREATININE 1.36*  CALCIUM 9.4  AST 42*  ALT 32  ALKPHOS 56  BILITOT 0.7   ------------------------------------------------------------------------------------------------------------------ CrCl cannot be calculated (Unknown ideal weight.). ------------------------------------------------------------------------------------------------------------------ No results for input(s): TSH, T4TOTAL, T3FREE, THYROIDAB in the last 72 hours.  Invalid input(s): FREET3  Coagulation profile Recent Labs  Lab 07/31/18 1911  INR 0.99   ------------------------------------------------------------------------------------------------------------------- No results for input(s): DDIMER in the last 72 hours. -------------------------------------------------------------------------------------------------------------------  Cardiac Enzymes No results for input(s): CKMB, TROPONINI, MYOGLOBIN in the last 168 hours.  Invalid input(s): CK ------------------------------------------------------------------------------------------------------------------ No results found for: BNP   ---------------------------------------------------------------------------------------------------------------  Urinalysis    Component Value Date/Time   COLORURINE YELLOW 07/31/2018 1848   APPEARANCEUR HAZY (A) 07/31/2018 1848   LABSPEC 1.013 07/31/2018 1848   PHURINE 6.0 07/31/2018 1848   GLUCOSEU NEGATIVE 07/31/2018 1848   GLUCOSEU NEGATIVE 04/14/2018 1542   HGBUR SMALL (A) 07/31/2018 1848   BILIRUBINUR NEGATIVE 07/31/2018 1848   BILIRUBINUR negative 03/27/2018 1553    KETONESUR NEGATIVE 07/31/2018 1848   PROTEINUR 100 (A) 07/31/2018 1848   UROBILINOGEN 0.2 04/14/2018 1542   NITRITE POSITIVE (A) 07/31/2018 1848   LEUKOCYTESUR LARGE (A) 07/31/2018 1848    ----------------------------------------------------------------------------------------------------------------   Imaging Results:    Dg Chest 2 View  Result Date: 07/31/2018 CLINICAL DATA:  Fever.  Altered mental status. EXAM: CHEST - 2 VIEW COMPARISON:  11/17/2011 FINDINGS: Lateral view degraded by patient arm position. Mild hyperinflation. Midline trachea. Mild cardiomegaly. Atherosclerosis in the transverse aorta. No pleural effusion or pneumothorax. Mild biapical pleuroparenchymal scarring. No lobar consolidation. No congestive failure. IMPRESSION: No acute cardiopulmonary disease. Cardiomegaly without congestive failure. Electronically Signed   By: Abigail Miyamoto M.D.   On: 07/31/2018 19:11      Assessment & Plan:    Principal Problem:   Acute lower UTI Active Problems:   Diabetes mellitus type II, controlled ([place])   Essential hypertension   Sepsis ([place])   Hypokalemia   AKI (acute kidney injury) ([place])    Sepsis (Fever, chills, leukocytosis, lactic acid elevation) Blood culture x2 Urine culture ordered  UTI (previously culture grew pseudomonas) Awaiting urine culture Start cefepime iv pharmacy to dose  Hypokalemia Replete Check cmp in am  Mild AKI Hydrate gently with ns iv Check cmp in am  Cardiomegaly (new) Check cardiac echo  Hypertension Cont acebutolol 448m po qday Cont Amlodipine 581mpo qday Cont Micardis/ hydrochlorothiazide  Hyperlipidemia Cont Lipitor 1029mo qhs  Hypothyroidism Cont Levothyroidism 100m40mgrams po qday  Dm2 Cont januTongaTradjenta 5mg 30mqday STOP Amaryl fsbs ac and qhs, ISS  Abnormal liver function Check acute hepatitis panel Check RUQ ultrasound Cont statin for now    DVT Prophylaxis  Lovenox - SCDs  AM Labs Ordered, also  please review Full Orders  Family Communication: Admission, patients condition and plan of care including tests being ordered have been discussed with the patient who indicate understanding and agree with the plan and Code Status.  Code Status  FULL CODE  Likely DC to  home  Condition GUARDED   Consults called:   none  Admission status: inpatient,  Pt will require hospitalization for sepsis secondary to UTI,  Pt will require iv abx. (cefepime)  Her previous culture grew out pseudomonas.  Which carries high risk of resistance.  We will need to await final culture results before discharging the patient.  Pt will require >=2 nites stay to await urine culture results and  ensure proper treatment of  sepsis/ UTI as well as hypokalemia, and AKI, and abnormal liver function w/up.    Time spent in minutes : 70 minutes   Jani Gravel M.D on 07/31/2018 at 8:43 PM  Between 7am to 7pm - Pager - [phone]  . After 7pm go to [email or web address] - password [facility]  [facility] - Office  [phone]

## 2018-07-31 NOTE — ED Provider Notes (Addendum)
[facility] DEPT Provider Note   CSN: [id number] Arrival date & time: 07/31/18  1749     History   Chief Complaint Chief Complaint  Patient presents with  . Urinary Tract Infection  . Code Sepsis    HPI [name] is a 84 y.o. female.  84 yo F with a chief complaint of a fever and difficulty urinating.  This started this morning.  There is limited history secondary to altered mental status.  Patient denies chest pain denies cough denies shortness of breath.  Denies sick contacts.  Denies abdominal pain vomiting diarrhea or constipation.  Level 5 caveat altered mental status.  The history is provided by the patient.  Urinary Tract Infection   This is a new problem. The current episode started 6 to 12 hours ago. The problem occurs every urination. The problem has not changed since onset.The pain is at a severity of 0/10. The patient is experiencing no pain. The maximum temperature recorded prior to her arrival was 103 to 104 F. The fever has been present for less than 1 day. She is not sexually active. Pertinent negatives include no chills, no nausea, no vomiting and no urgency.  Illness  This is a new problem. The current episode started yesterday. The problem occurs constantly. The problem has not changed since onset.Pertinent negatives include no chest pain, no headaches and no shortness of breath. Nothing aggravates the symptoms. Nothing relieves the symptoms. She has tried nothing for the symptoms. The treatment provided no relief.    Past Medical History:  Diagnosis Date  . Anxiety    loss of child 1 year ago  . Cancer ([place])    right breast  . Diabetes mellitus, type 2 ([place])   . Glaucoma   . Gout   . Hyperlipidemia   . Hypertension    states eccho Dr Mare Ferrari 1 year ago- no record found in [facility] or office records  . Hyperthyroidism     Patient Active Problem List   Diagnosis Date Noted  . Acute lower UTI 07/31/2018  . Sepsis ([place])  07/31/2018  . Hypokalemia 07/31/2018  . AKI (acute kidney injury) ([place]) 07/31/2018  . CKD (chronic kidney disease) stage 3, GFR 30-59 ml/min ([facility]) 07/09/2018  . Urinary frequency 04/14/2018  . Hyperlipidemia 04/08/2017  . Right knee pain 12/10/2016  . Osteoarthritis 04/23/2016  . Peripheral neuropathy 04/23/2016  . Joint pain, hip 06/22/2015  . Skin lesion of face 06/10/2013  . General medical examination 10/30/2011  . Cancer of overlapping sites of right female breast ([place]) 10/09/2011  . Hypothyroidism 12/13/2010  . Diabetes mellitus type II, controlled ([place]) 12/13/2010  . Gout 12/13/2010  . ANXIETY 12/13/2010  . Essential hypertension 12/13/2010    Past Surgical History:  Procedure Laterality Date  . ABDOMINAL HYSTERECTOMY  1995  . BREAST LUMPECTOMY  11/06/2011   Procedure: LUMPECTOMY;  Surgeon: Harl Bowie, MD;  Location: [facility];  Service: General;  Laterality: Right;  . CHOLECYSTECTOMY  1976  . ROTATOR CUFF REPAIR  1999   right     OB History   None      Home Medications    Prior to Admission medications   Medication Sig Start Date End Date Taking? Authorizing Provider  acebutolol (SECTRAL) 400 MG capsule Take 1 capsule (400 mg total) by mouth at bedtime. 07/09/18   [name], MD  amLODipine (NORVASC) 5 MG tablet Take 1 tablet (5 mg total) by mouth every morning. 07/09/18  [name], MD  atorvastatin (LIPITOR) 10 MG tablet Take 1 tablet (10 mg total) by mouth at bedtime. 07/09/18   [name], MD  gabapentin (NEURONTIN) 100 MG capsule Take 2 capsules (200 mg total) by mouth 3 (three) times daily. 07/09/18   [name], MD  glimepiride (AMARYL) 2 MG tablet Take 1 tablet (2 mg total) by mouth daily before breakfast. 07/09/18   [name], MD  glucose blood (TRUETRACK TEST) test strip Use as instructed to check blood sugar once a day.  Dx E11.9 07/09/18   [name], MD  levothyroxine  (SYNTHROID, LEVOTHROID) 88 MCG tablet Take 1 tablet (88 mcg total) by mouth daily. 07/09/18   [name], MD  sitaGLIPtin (JANUVIA) 50 MG tablet Take 1 tablet (50 mg total) by mouth every morning. 07/09/18   [name], MD  telmisartan-hydrochlorothiazide (MICARDIS HCT) 80-12.5 MG tablet Take 1 tablet by mouth every morning. 07/09/18   [name], MD  TRUEPLUS LANCETS 33G MISC Use to check blood sugar once a day. Dx E11.9 07/09/18   [name], MD    Family History Family History  Problem Relation Age of Onset  . Cancer Mother        bladder  . Kidney disease Mother   . Heart attack Brother   . Stroke Father   . Hypertension Sister   . Hyperlipidemia Daughter   . Diabetes Sister     Social History Social History   Tobacco Use  . Smoking status: Never Smoker  . Smokeless tobacco: Never Used  Substance Use Topics  . Alcohol use: No  . Drug use: No     Allergies   Patient has no known allergies.   Review of Systems Review of Systems  Unable to perform ROS: Mental status change  Constitutional: Negative for chills and fever.  HENT: Negative for congestion and rhinorrhea.   Eyes: Negative for redness and visual disturbance.  Respiratory: Negative for shortness of breath and wheezing.   Cardiovascular: Negative for chest pain and palpitations.  Gastrointestinal: Negative for nausea and vomiting.  Genitourinary: Positive for difficulty urinating. Negative for dysuria and urgency.  Musculoskeletal: Negative for arthralgias and myalgias.  Skin: Negative for pallor and wound.  Neurological: Positive for weakness. Negative for dizziness and headaches.     Physical Exam Updated Vital Signs BP 140/60   Pulse 75   Temp (!) 103.8 F (39.9 C) (Rectal)   Resp (!) 23   SpO2 97%   Physical Exam  Constitutional: She is oriented to person, place, and time. She appears well-developed and well-nourished. No distress.  HENT:  Head:  Normocephalic and atraumatic.  Eyes: Pupils are equal, round, and reactive to light. EOM are normal.  Neck: Normal range of motion. Neck supple.  Cardiovascular: Normal rate and regular rhythm. Exam reveals no gallop and no friction rub.  No murmur heard. Pulmonary/Chest: Effort normal. She has no wheezes. She has no rales.  Abdominal: Soft. She exhibits no distension. There is no tenderness.  Musculoskeletal: She exhibits no edema or tenderness.  Neurological: She is alert and oriented to person, place, and time.  Skin: Skin is warm and dry. She is not diaphoretic.  Psychiatric: She has a normal mood and affect. Her behavior is normal.  Nursing note and vitals reviewed.    ED Treatments / Results  Labs (all labs ordered are listed, but only abnormal results are displayed) Labs Reviewed  COMPREHENSIVE METABOLIC PANEL - Abnormal;  Notable for the following components:      Result Value   Potassium 3.4 (*)    Glucose, Bld 231 (*)    BUN 27 (*)    Creatinine, Ser 1.36 (*)    AST 42 (*)    GFR calc non Af Amer 32 (*)    GFR calc Af Amer 37 (*)    All other components within normal limits  CBC WITH DIFFERENTIAL/PLATELET - Abnormal; Notable for the following components:   WBC 10.7 (*)    Neutro Abs 10.0 (*)    Lymphs Abs 0.3 (*)    All other components within normal limits  URINALYSIS, ROUTINE W REFLEX MICROSCOPIC - Abnormal; Notable for the following components:   APPearance HAZY (*)    Hgb urine dipstick SMALL (*)    Protein, ur 100 (*)    Nitrite POSITIVE (*)    Leukocytes, UA LARGE (*)    WBC, UA >50 (*)    Bacteria, UA FEW (*)    All other components within normal limits  I-STAT CG4 LACTIC ACID, ED - Abnormal; Notable for the following components:   Lactic Acid, Venous 3.69 (*)    All other components within normal limits  CULTURE, BLOOD (ROUTINE X 2)  CULTURE, BLOOD (ROUTINE X 2)  PROTIME-INR  I-STAT CG4 LACTIC ACID, ED    EKG None  Radiology Dg Chest 2  View  Result Date: 07/31/2018 CLINICAL DATA:  Fever.  Altered mental status. EXAM: CHEST - 2 VIEW COMPARISON:  11/17/2011 FINDINGS: Lateral view degraded by patient arm position. Mild hyperinflation. Midline trachea. Mild cardiomegaly. Atherosclerosis in the transverse aorta. No pleural effusion or pneumothorax. Mild biapical pleuroparenchymal scarring. No lobar consolidation. No congestive failure. IMPRESSION: No acute cardiopulmonary disease. Cardiomegaly without congestive failure. Electronically Signed   By: Abigail Miyamoto M.D.   On: 07/31/2018 19:11    Procedures Procedures (including critical care time)  Medications Ordered in ED Medications  ceFEPIme (MAXIPIME) 1 g in sodium chloride 0.9 % 100 mL IVPB (1 g Intravenous New Bag/Given 07/31/18 2110)  sodium chloride 0.9 % bolus 1,000 mL (1,000 mLs Intravenous New Bag/Given 07/31/18 1916)  acetaminophen (TYLENOL) tablet 1,000 mg (1,000 mg Oral Given 07/31/18 1917)     Initial Impression / Assessment and Plan / ED Course  I have reviewed the triage vital signs and the nursing notes.  Pertinent labs & imaging results that were available during my care of the patient were reviewed by me and considered in my medical decision making (see chart for details).     84 yo F with a chief complaint of altered mental status and difficulty with urination.  The patient was found to have a temperature of 104.  Suspect infectious etiology.  Will obtain a urine chest x-ray lab work.  Give a bolus of fluids.  Patient has a lactate of 3.7.  Patient has a urinary tract infection with nitrite positive.  Review shows that she had a Pseudomonas positive urinary tract infection.  We will give cefepime.  Discussed with the hospitalist.  CRITICAL CARE Performed by: Cecilio Asper   Total critical care time: 35 minutes  Critical care time was exclusive of separately billable procedures and treating other patients.  Critical care was necessary to treat or  prevent imminent or life-threatening deterioration.  Critical care was time spent personally by me on the following activities: development of treatment plan with patient and/or surrogate as well as nursing, discussions with consultants, evaluation of patient's response to treatment,  examination of patient, obtaining history from patient or surrogate, ordering and performing treatments and interventions, ordering and review of laboratory studies, ordering and review of radiographic studies, pulse oximetry and re-evaluation of patient's condition.  The patients results and plan were reviewed and discussed.   Any x-rays performed were independently reviewed by myself.   Differential diagnosis were considered with the presenting HPI.  Medications  ceFEPIme (MAXIPIME) 1 g in sodium chloride 0.9 % 100 mL IVPB (1 g Intravenous New Bag/Given 07/31/18 2110)  sodium chloride 0.9 % bolus 1,000 mL (1,000 mLs Intravenous New Bag/Given 07/31/18 1916)  acetaminophen (TYLENOL) tablet 1,000 mg (1,000 mg Oral Given 07/31/18 1917)    Vitals:   07/31/18 1832 07/31/18 1915 07/31/18 2100  BP: 138/77  140/60  Pulse: 91 87 75  Resp: (!) 30 17 (!) 23  Temp: (!) 103.8 F (39.9 C)    TempSrc: Rectal    SpO2: 95% 96% 97%    Final diagnoses:  Pyelonephritis    Admission/ observation were discussed with the admitting physician, patient and/or family and they are comfortable with the plan.   Final Clinical Impressions(s) / ED Diagnoses   Final diagnoses:  Pyelonephritis    ED Discharge Orders    None       Deno Etienne, DO 07/31/18 2124    Deno Etienne, DO 07/31/18 2125

## 2018-07-31 NOTE — Progress Notes (Signed)
Pharmacy Antibiotic Note  [name] is a 84 y.o. female admitted on 07/31/2018 with sepsis.  Pharmacy has been consulted for Cefepime dosing.  Plan: Cefepime 1gm iv q12hr  Height: 5\' 7"  (170.2 cm) Weight: 183 lb 12.8 oz (83.4 kg) IBW/kg (Calculated) : 61.6  Temp (24hrs), Avg:100.3 F (37.9 C), Min:98.1 F (36.7 C), Max:103.8 F (39.9 C)  Recent Labs  Lab 07/31/18 1911 07/31/18 1935 07/31/18 2124  WBC 10.7*  --   --   CREATININE 1.36*  --   --   LATICACIDVEN  --  3.69* 2.80*    Estimated Creatinine Clearance: 26.9 mL/min (A) (by C-G formula based on SCr of 1.36 mg/dL (H)).    No Known Allergies  Antimicrobials this admission: Cefepime 07/31/2018 >>   Dose adjustments this admission: -  Microbiology results: -  Thank you for allowing pharmacy to be a part of this patient's care.  Nani Skillern Crowford 07/31/2018 11:56 PM

## 2018-07-31 NOTE — ED Notes (Signed)
POC Lactic 2.8 EDP [place] notified.

## 2018-07-31 NOTE — ED Notes (Signed)
Bed: UY29 Expected date:  Expected time:  Means of arrival:  Comments: 84 yo Code Sepsis

## 2018-08-01 ENCOUNTER — Inpatient Hospital Stay (HOSPITAL_COMMUNITY): Payer: Medicare Other

## 2018-08-01 DIAGNOSIS — E119 Type 2 diabetes mellitus without complications: Secondary | ICD-10-CM

## 2018-08-01 DIAGNOSIS — A419 Sepsis, unspecified organism: Secondary | ICD-10-CM

## 2018-08-01 DIAGNOSIS — R945 Abnormal results of liver function studies: Secondary | ICD-10-CM

## 2018-08-01 DIAGNOSIS — I340 Nonrheumatic mitral (valve) insufficiency: Secondary | ICD-10-CM

## 2018-08-01 DIAGNOSIS — N179 Acute kidney failure, unspecified: Secondary | ICD-10-CM

## 2018-08-01 DIAGNOSIS — N390 Urinary tract infection, site not specified: Secondary | ICD-10-CM

## 2018-08-01 LAB — ECHOCARDIOGRAM COMPLETE
HEIGHTINCHES: 67 in
Weight: 2939.2 oz

## 2018-08-01 LAB — GLUCOSE, CAPILLARY
Glucose-Capillary: 130 mg/dL — ABNORMAL HIGH (ref 70–99)
Glucose-Capillary: 133 mg/dL — ABNORMAL HIGH (ref 70–99)
Glucose-Capillary: 182 mg/dL — ABNORMAL HIGH (ref 70–99)

## 2018-08-01 LAB — COMPREHENSIVE METABOLIC PANEL
ALT: 46 U/L — ABNORMAL HIGH (ref 0–44)
AST: 60 U/L — AB (ref 15–41)
Albumin: 3.2 g/dL — ABNORMAL LOW (ref 3.5–5.0)
Alkaline Phosphatase: 42 U/L (ref 38–126)
Anion gap: 13 (ref 5–15)
BILIRUBIN TOTAL: 0.7 mg/dL (ref 0.3–1.2)
BUN: 22 mg/dL (ref 8–23)
CHLORIDE: 102 mmol/L (ref 98–111)
CO2: 19 mmol/L — ABNORMAL LOW (ref 22–32)
Calcium: 8.4 mg/dL — ABNORMAL LOW (ref 8.9–10.3)
Creatinine, Ser: 1.22 mg/dL — ABNORMAL HIGH (ref 0.44–1.00)
GFR calc Af Amer: 42 mL/min — ABNORMAL LOW (ref >60)
GFR, EST NON AFRICAN AMERICAN: 36 mL/min — AB (ref >60)
Glucose, Bld: 160 mg/dL — ABNORMAL HIGH (ref 70–99)
POTASSIUM: 3.6 mmol/L (ref 3.5–5.1)
Sodium: 134 mmol/L — ABNORMAL LOW (ref 135–145)
TOTAL PROTEIN: 5.7 g/dL — AB (ref 6.5–8.1)

## 2018-08-01 LAB — CBC
HEMATOCRIT: 34.1 % — AB (ref 36.0–46.0)
Hemoglobin: 11.6 g/dL — ABNORMAL LOW (ref 12.0–15.0)
MCH: 33 pg (ref 26.0–34.0)
MCHC: 34 g/dL (ref 30.0–36.0)
MCV: 97.2 fL (ref 78.0–100.0)
PLATELETS: 162 10*3/uL (ref 150–400)
RBC: 3.51 MIL/uL — ABNORMAL LOW (ref 3.87–5.11)
RDW: 12.3 % (ref 11.5–15.5)
WBC: 9 10*3/uL (ref 4.0–10.5)

## 2018-08-01 NOTE — Progress Notes (Signed)
  Echocardiogram 2D Echocardiogram has been performed.  [name] F 08/01/2018, 11:58 AM

## 2018-08-01 NOTE — Progress Notes (Signed)
PROGRESS NOTE    [name]  KYH:[id number] DOB: 1919-02-18 DOA: 07/31/2018 PCP: [name], MD  Brief Narrative: 84 y.o. female, w hypertension, hyperlipidemia, Dm2,  Hypothyroidism, gout, apparently c/o chills this am as well as dysuria. Pt had subjective fever as well.  Denies cough, cp, palp, sob, n/v, diarrhea, brbpr, hematuria, flank pain.  Pt presented to ED due to her daughter calling EMS.   In Ed,  T 99.1 (TMAX 103.8) P 73-91,  RR 29  Bp 140/59  Pox 94  CXR IMPRESSION: No acute cardiopulmonary disease.  Cardiomegaly without congestive failure.  Urinalysis  Nitritate +, LE + Wbc >50, rbc 6-10  Her prior culture grew out pseudomonas in the past  Na 136, K 3.4 Bun 27, Creatinine 1.36 Ast 42, Alt 32, Alk phos 56, T. Bili 0.7 Wbc 10.7, hgb 13.1, Plt 210 INR 0.99 Blood culture x2 pending  Lactic acid 3.69=> 2.80  Pt will be admitted for sepsis (fever, chills, leukocytosis, elevated lactic acid 3.69) secondary to UTI, and hypokalemia, abnromal liver function, and  mild AKI, renal insufficiency.   Assessment & Plan:   Principal Problem:   Sepsis ([place]) Active Problems:   Diabetes mellitus type II, controlled ([place])   Essential hypertension   Acute lower UTI   Hypokalemia   AKI (acute kidney injury) ([place])   #1 sepsis secondary to urinary tract infection in a patient with history of recent Pseudomonas UTI-continue cefepime follow-up urine culture.  Lactic acid level trending down from 3.6 92.80.  Leukocytosis improved.  #2 mild AKI DC HCTZ patient received slow IV hydration overnight creatinine improved he is she is also on Micardis which will be continued we will closely monitor.  #3 hypertension continue acebutolol amlodipine and Micardis.  #4 hyperlipidemia hold Lipitor  #5 hypothyroidism continue Synthroid  #6 type 2 diabetes stable continue Tradjenta  #7 elevated LFTs AST and ALT hold statin and recheck.     DVT prophylaxis:  Lovenox Code Status: Full code Family Communication: Will call daughter Disposition Plan TBD, consult PT Consultants:  None  Procedures: None Antimicrobials: Cefepime  Subjective: Resting in bed in no acute distress   Objective: Vitals:   07/31/18 2235 08/01/18 0217 08/01/18 0524 08/01/18 1000  BP:  135/76 (!) 129/52 (!) 146/63  Pulse:  79 75 76  Resp:  (!) 30 (!) 28 (!) 22  Temp:  99.2 F (37.3 C) 100.3 F (37.9 C) (!) 100.7 F (38.2 C)  TempSrc:  Oral Oral Oral  SpO2:  94% 92% 92%  Weight: 83.4 kg  83.3 kg   Height: 5' 7"  (1.702 m)       Intake/Output Summary (Last 24 hours) at 08/01/2018 1154 Last data filed at 08/01/2018 1034 Gross per 24 hour  Intake 1920.74 ml  Output 150 ml  Net 1770.74 ml   Filed Weights   07/31/18 2235 08/01/18 0524  Weight: 83.4 kg 83.3 kg    Examination: Resting in bed in no acute distress denies any nausea vomiting or diarrhea  General exam: Appears calm and comfortable  Respiratory system: Clear to auscultation. Respiratory effort normal. Cardiovascular system: S1 & S2 heard, RRR. No JVD, murmurs, rubs, gallops or clicks. No pedal edema. Gastrointestinal system: Abdomen is nondistended, soft and nontender. No organomegaly or masses felt. Normal bowel sounds heard. Central nervous system: Alert and oriented. No focal neurological deficits. Extremities: Symmetric 5 x 5 power. Skin: No rashes, lesions or ulcers Psychiatry: Judgement and insight appear normal. Mood & affect appropriate.  Data Reviewed: I have personally reviewed following labs and imaging studies  CBC: Recent Labs  Lab 07/31/18 1911 08/01/18 0457  WBC 10.7* 9.0  NEUTROABS 10.0*  --   HGB 13.1 11.6*  HCT 38.5 34.1*  MCV 97.5 97.2  PLT 210 295   Basic Metabolic Panel: Recent Labs  Lab 07/31/18 1911 08/01/18 0457  NA 136 134*  K 3.4* 3.6  CL 101 102  CO2 23 19*  GLUCOSE 231* 160*  BUN 27* 22  CREATININE 1.36* 1.22*  CALCIUM 9.4 8.4*    GFR: Estimated Creatinine Clearance: 29.9 mL/min (A) (by C-G formula based on SCr of 1.22 mg/dL (H)). Liver Function Tests: Recent Labs  Lab 07/31/18 1911 08/01/18 0457  AST 42* 60*  ALT 32 46*  ALKPHOS 56 42  BILITOT 0.7 0.7  PROT 7.3 5.7*  ALBUMIN 4.2 3.2*   No results for input(s): LIPASE, AMYLASE in the last 168 hours. No results for input(s): AMMONIA in the last 168 hours. Coagulation Profile: Recent Labs  Lab 07/31/18 1911  INR 0.99   Cardiac Enzymes: No results for input(s): CKTOTAL, CKMB, CKMBINDEX, TROPONINI in the last 168 hours. BNP (last 3 results) No results for input(s): PROBNP in the last 8760 hours. HbA1C: No results for input(s): HGBA1C in the last 72 hours. CBG: Recent Labs  Lab 07/31/18 2303 08/01/18 0738  GLUCAP 148* 130*   Lipid Profile: No results for input(s): CHOL, HDL, LDLCALC, TRIG, CHOLHDL, LDLDIRECT in the last 72 hours. Thyroid Function Tests: No results for input(s): TSH, T4TOTAL, FREET4, T3FREE, THYROIDAB in the last 72 hours. Anemia Panel: No results for input(s): VITAMINB12, FOLATE, FERRITIN, TIBC, IRON, RETICCTPCT in the last 72 hours. Sepsis Labs: Recent Labs  Lab 07/31/18 1935 07/31/18 2124  LATICACIDVEN 3.69* 2.80*    Recent Results (from the past 240 hour(s))  Culture, blood (Routine x 2)     Status: None (Preliminary result)   Collection Time: 07/31/18  7:13 PM  Result Value Ref Range Status   Specimen Description   Final    BLOOD RIGHT ANTECUBITAL Performed at [facility] [address] [place], [place] [address]    Special Requests   Final    BOTTLES DRAWN AEROBIC AND ANAEROBIC Blood Culture adequate volume Performed at [facility] [address], [place], [place] [address]    Culture   Final    NO GROWTH < 12 HOURS Performed at [facility] [address], [place], [place] [address]    Report Status PENDING  Incomplete  Culture, blood (Routine x 2)      Status: None (Preliminary result)   Collection Time: 07/31/18  7:13 PM  Result Value Ref Range Status   Specimen Description   Final    BLOOD RIGHT HAND Performed at [facility] [address], [place], [place] [address]    Special Requests   Final    BOTTLES DRAWN AEROBIC AND ANAEROBIC Blood Culture results may not be optimal due to an inadequate volume of blood received in culture bottles Performed at [facility] [address] [place], [place] [address]    Culture   Final    NO GROWTH < 12 HOURS Performed at [facility] [address] [place], [place] [address]    Report Status PENDING  Incomplete         Radiology Studies: Dg Chest 2 View  Result Date: 07/31/2018 CLINICAL DATA:  Fever.  Altered mental status.  EXAM: CHEST - 2 VIEW COMPARISON:  11/17/2011 FINDINGS: Lateral view degraded by patient arm position. Mild hyperinflation. Midline trachea. Mild cardiomegaly. Atherosclerosis in the transverse aorta. No pleural effusion or pneumothorax. Mild biapical pleuroparenchymal scarring. No lobar consolidation. No congestive failure. IMPRESSION: No acute cardiopulmonary disease. Cardiomegaly without congestive failure. Electronically Signed   By: Abigail Miyamoto M.D.   On: 07/31/2018 19:11   US Abdomen Limited Ruq  Result Date: 08/01/2018 CLINICAL DATA:  Abnormal liver function tests EXAM: ULTRASOUND ABDOMEN LIMITED RIGHT UPPER QUADRANT COMPARISON:  None. FINDINGS: Gallbladder: Surgically absent Common bile duct: Diameter: 4 mm.  Where visualized, no filling defect. Liver: No focal lesion identified. Within normal limits in parenchymal echogenicity. Portal vein is patent on color Doppler imaging with normal direction of blood flow towards the liver. IMPRESSION: No acute finding or explanation for the history. Cholecystectomy. Electronically Signed   By: Monte Fantasia M.D.   On: 08/01/2018 08:59        Scheduled Meds: . acebutolol   400 mg Oral QHS  . amLODipine  5 mg Oral Daily  . atorvastatin  10 mg Oral QHS  . enoxaparin (LOVENOX) injection  30 mg Subcutaneous QHS  . gabapentin  200 mg Oral TID  . hydrochlorothiazide  12.5 mg Oral Daily  . insulin aspart  0-5 Units Subcutaneous QHS  . insulin aspart  0-9 Units Subcutaneous TID WC  . irbesartan  300 mg Oral Daily  . levothyroxine  88 mcg Oral QAC breakfast  . linagliptin  5 mg Oral Daily   Continuous Infusions: . ceFEPime (MAXIPIME) IV 1 g (08/01/18 1034)     LOS: 1 day     Georgette Shell, MD [facility]  If 7PM-7AM, please contact night-coverage [email or web address] Password [facility] 08/01/2018, 11:54 AM

## 2018-08-02 LAB — COMPREHENSIVE METABOLIC PANEL
ALBUMIN: 2.9 g/dL — AB (ref 3.5–5.0)
ALK PHOS: 44 U/L (ref 38–126)
ALT: 44 U/L (ref 0–44)
ANION GAP: 9 (ref 5–15)
AST: 53 U/L — ABNORMAL HIGH (ref 15–41)
BUN: 23 mg/dL (ref 8–23)
CHLORIDE: 102 mmol/L (ref 98–111)
CO2: 23 mmol/L (ref 22–32)
Calcium: 8.2 mg/dL — ABNORMAL LOW (ref 8.9–10.3)
Creatinine, Ser: 1.31 mg/dL — ABNORMAL HIGH (ref 0.44–1.00)
GFR calc Af Amer: 38 mL/min — ABNORMAL LOW (ref >60)
GFR calc non Af Amer: 33 mL/min — ABNORMAL LOW (ref >60)
GLUCOSE: 147 mg/dL — AB (ref 70–99)
POTASSIUM: 3.5 mmol/L (ref 3.5–5.1)
Sodium: 134 mmol/L — ABNORMAL LOW (ref 135–145)
Total Bilirubin: 0.9 mg/dL (ref 0.3–1.2)
Total Protein: 5.5 g/dL — ABNORMAL LOW (ref 6.5–8.1)

## 2018-08-02 LAB — GLUCOSE, CAPILLARY
GLUCOSE-CAPILLARY: 130 mg/dL — AB (ref 70–99)
Glucose-Capillary: 169 mg/dL — ABNORMAL HIGH (ref 70–99)

## 2018-08-02 LAB — CBC
HCT: 31.9 % — ABNORMAL LOW (ref 36.0–46.0)
Hemoglobin: 11 g/dL — ABNORMAL LOW (ref 12.0–15.0)
MCH: 33.1 pg (ref 26.0–34.0)
MCHC: 34.5 g/dL (ref 30.0–36.0)
MCV: 96.1 fL (ref 78.0–100.0)
PLATELETS: 150 10*3/uL (ref 150–400)
RBC: 3.32 MIL/uL — AB (ref 3.87–5.11)
RDW: 12.2 % (ref 11.5–15.5)
WBC: 8.7 10*3/uL (ref 4.0–10.5)

## 2018-08-02 MED ORDER — GLIMEPIRIDE 1 MG PO TABS: 1.0000 mg | ORAL_TABLET | ORAL | 11 refills | Status: DC

## 2018-08-02 NOTE — Discharge Summary (Addendum)
Physician Discharge Summary  [name] MIW:[id number] DOB: 07/14/1919 DOA: 07/31/2018  PCP: Cheryl Maw, MD  Admit date: 07/31/2018 Discharge date: 08/02/2018  Admitted From:home Disposition:  Home Recommendations for Outpatient Follow-up:  1. Follow up with PCP in 1-2 weeks 2. Please obtain CMP/CBC in one week to follow-up on liver function test and renal function test.  Home Health:pt Equipment/Devices none  Discharge Condition none CODE STATUS full Diet recommendation:  cardiac  Brief/Interim Summary:84 y.o.female,w hypertension, hyperlipidemia, Dm2, Hypothyroidism, gout, apparently c/o chills this am as well as dysuria. Pt had subjective fever as well. Denies cough, cp, palp, sob, n/v, diarrhea, brbpr, hematuria, flank pain. Pt presented to ED due to her daughter calling EMS.   In Ed,  T 99.1 (TMAX 103.8) P 73-91, RR 29 Bp 140/59 Pox 94  CXR IMPRESSION: No acute cardiopulmonary disease.  Cardiomegaly without congestive failure.  Urinalysis  Nitritate +, LE + Wbc >50, rbc 6-10  Her prior culture grew out pseudomonas in the past Na 136, K 3.4 Bun 27, Creatinine 1.36 Ast 42, Alt 32, Alk phos 56, T. Bili 0.7 Wbc 10.7, hgb 13.1, Plt 210 INR 0.99 Blood culture x2 pending  Lactic acid 3.69=> 2.80  Pt will be admitted for sepsis (fever, chills, leukocytosis, elevated lactic acid 3.69) secondary to UTI, and hypokalemia, abnromal liver function, and mild AKI, renal insufficiency.    Discharge Diagnoses:  Principal Problem:   Sepsis ([place]) Active Problems:   Diabetes mellitus type II, controlled ([place])   Essential hypertension   Acute lower UTI   Hypokalemia   AKI (acute kidney injury) ([place])   Abnormal liver function  #1 sepsis secondary to urinary tract infection -Patient was treated with cefepime.urine culture GNR.Sensitivity pending.will dc patient on cipro  Lactic acid level trending down from 3.6 to 2.80.  Leukocytosis improved.   Patient anxious to go home and requesting discharge.ADDENDUM -CIPROFLOXACIN CALLED INTO [facility] ON [address].  #2 mild AKI- DC HCTZ and Ace inhibitor.patient received slow IV hydration overnight creatinine improved   #3 hypertension continue acebutolol amlodipine.  Blood pressure stable.  ACE inhibitor and hydrochlorothiazide was stopped due to mild AKI.  #4 hyperlipidemia hold Lipitor  #5 hypothyroidism continue Synthroid  #6 type 2 diabetes stable continue Tradjenta,decrease glyburide 28m  #7 elevated LFTs AST and ALT her statin was stopped recheck as an outpatient and restart as appropriate.  Discharge Instructions  Discharge Instructions    Call MD for:  difficulty breathing, headache or visual disturbances   Complete by:  As directed    Call MD for:  difficulty breathing, headache or visual disturbances   Complete by:  As directed    Call MD for:  persistant dizziness or light-headedness   Complete by:  As directed    Call MD for:  persistant dizziness or light-headedness   Complete by:  As directed    Call MD for:  persistant nausea and vomiting   Complete by:  As directed    Call MD for:  persistant nausea and vomiting   Complete by:  As directed    Call MD for:  severe uncontrolled pain   Complete by:  As directed    Diet - low sodium heart healthy   Complete by:  As directed    Diet - low sodium heart healthy   Complete by:  As directed    Increase activity slowly   Complete by:  As directed    Increase activity slowly   Complete by:  As  directed      Allergies as of 08/02/2018   No Known Allergies     Medication List    STOP taking these medications   acetaminophen 500 MG tablet Commonly known as:  TYLENOL   telmisartan-hydrochlorothiazide 80-12.5 MG tablet Commonly known as:  MICARDIS HCT     TAKE these medications   acebutolol 400 MG capsule Commonly known as:  SECTRAL Take 1 capsule (400 mg total) by mouth at bedtime.    amLODipine 5 MG tablet Commonly known as:  NORVASC Take 1 tablet (5 mg total) by mouth every morning.   atorvastatin 10 MG tablet Commonly known as:  LIPITOR Take 1 tablet (10 mg total) by mouth at bedtime.   diclofenac sodium 1 % Gel Commonly known as:  VOLTAREN Apply 2 g topically 4 (four) times daily as needed (pain).   gabapentin 100 MG capsule Commonly known as:  NEURONTIN Take 2 capsules (200 mg total) by mouth 3 (three) times daily. What changed:    how much to take  when to take this  additional instructions   glimepiride 1 MG tablet Commonly known as:  AMARYL Take 1 tablet (1 mg total) by mouth every morning. What changed:    medication strength  how much to take  when to take this   glucose blood test strip Use as instructed to check blood sugar once a day.  Dx E11.9   hydroxypropyl methylcellulose / hypromellose 2.5 % ophthalmic solution Commonly known as:  ISOPTO TEARS / GONIOVISC Place 1 drop into both eyes 3 (three) times daily as needed for dry eyes.   levothyroxine 100 MCG tablet Commonly known as:  SYNTHROID, LEVOTHROID Take 100 mcg by mouth daily before breakfast. What changed:  Another medication with the same name was removed. Continue taking this medication, and follow the directions you see here.   sitaGLIPtin 50 MG tablet Commonly known as:  JANUVIA Take 1 tablet (50 mg total) by mouth every morning.   thiamine 100 MG tablet Commonly known as:  VITAMIN B-1 Take 100 mg by mouth daily.   TRUEPLUS LANCETS 33G Misc Use to check blood sugar once a day. Dx E11.9      Follow-up Information    Cheryl Maw, MD Follow up.   Specialty:  Family Medicine Contact information: [place] [id number] [phone]          No Known Allergies  Consultations: none  Procedures/Studies: Dg Chest 2 View  Result Date: 07/31/2018 CLINICAL DATA:  Fever.  Altered mental status. EXAM: CHEST - 2 VIEW  COMPARISON:  11/17/2011 FINDINGS: Lateral view degraded by patient arm position. Mild hyperinflation. Midline trachea. Mild cardiomegaly. Atherosclerosis in the transverse aorta. No pleural effusion or pneumothorax. Mild biapical pleuroparenchymal scarring. No lobar consolidation. No congestive failure. IMPRESSION: No acute cardiopulmonary disease. Cardiomegaly without congestive failure. Electronically Signed   By: Abigail Miyamoto M.D.   On: 07/31/2018 19:11   US Abdomen Limited Ruq  Result Date: 08/01/2018 CLINICAL DATA:  Abnormal liver function tests EXAM: ULTRASOUND ABDOMEN LIMITED RIGHT UPPER QUADRANT COMPARISON:  None. FINDINGS: Gallbladder: Surgically absent Common bile duct: Diameter: 4 mm.  Where visualized, no filling defect. Liver: No focal lesion identified. Within normal limits in parenchymal echogenicity. Portal vein is patent on color Doppler imaging with normal direction of blood flow towards the liver. IMPRESSION: No acute finding or explanation for the history. Cholecystectomy. Electronically Signed   By: Monte Fantasia M.D.   On: 08/01/2018 08:59    (  Echo, Carotid, EGD, Colonoscopy, ERCP)    Subjective:   Discharge Exam: Vitals:   08/01/18 2143 08/02/18 0555  BP: (!) 126/53 (!) 142/51  Pulse: 70 64  Resp: 20 18  Temp: 98.8 F (37.1 C) 98.7 F (37.1 C)  SpO2: 93% 90%   Vitals:   08/01/18 1500 08/01/18 2143 08/02/18 0551 08/02/18 0555  BP:  (!) 126/53  (!) 142/51  Pulse: 65 70  64  Resp:  20  18  Temp:  98.8 F (37.1 C)  98.7 F (37.1 C)  TempSrc:      SpO2:  93%  90%  Weight:   83.9 kg   Height:        General: Pt is alert, awake, not in acute distress Cardiovascular: RRR, S1/S2 +, no rubs, no gallops Respiratory: CTA bilaterally, no wheezing, no rhonchi Abdominal: Soft, NT, ND, bowel sounds + Extremities: no edema, no cyanosis    The results of significant diagnostics from this hospitalization (including imaging, microbiology, ancillary and laboratory)  are listed below for reference.     Microbiology: Recent Results (from the past 240 hour(s))  Urine Culture     Status: Abnormal (Preliminary result)   Collection Time: 07/31/18  6:48 PM  Result Value Ref Range Status   Specimen Description   Final    URINE, RANDOM Performed at [facility] [address], [place], [place] [address]    Special Requests   Final    NONE Performed at [facility], [place] [address], [place], [place] [address]    Culture >=100,000 COLONIES/mL GRAM NEGATIVE RODS (A)  Final   Report Status PENDING  Incomplete  Culture, blood (Routine x 2)     Status: None (Preliminary result)   Collection Time: 07/31/18  7:13 PM  Result Value Ref Range Status   Specimen Description   Final    BLOOD RIGHT ANTECUBITAL Performed at [facility] [address], [place], [place] [address]    Special Requests   Final    BOTTLES DRAWN AEROBIC AND ANAEROBIC Blood Culture adequate volume Performed at [facility] [address], [place], [place] [address]    Culture   Final    NO GROWTH < 24 HOURS Performed at [facility] [address], [place], [place] [address]    Report Status PENDING  Incomplete  Culture, blood (Routine x 2)     Status: None (Preliminary result)   Collection Time: 07/31/18  7:13 PM  Result Value Ref Range Status   Specimen Description   Final    BLOOD RIGHT HAND Performed at [facility] [address], [place], [place] [address]    Special Requests   Final    BOTTLES DRAWN AEROBIC AND ANAEROBIC Blood Culture results may not be optimal due to an inadequate volume of blood received in culture bottles Performed at [facility] [address], [place], [place] [address]    Culture   Final    NO GROWTH < 24 HOURS Performed at [facility] [address], [place], [place] [address]    Report Status PENDING  Incomplete      Labs: BNP (last 3 results) No results for input(s): BNP in the last 8760 hours. Basic Metabolic Panel: Recent Labs  Lab 07/31/18 1911 08/01/18 0457 08/02/18 0517  NA 136 134* 134*  K 3.4* 3.6 3.5  CL 101 102 102  CO2 23 19* 23  GLUCOSE 231* 160* 147*  BUN 27* 22 23  CREATININE 1.36* 1.22* 1.31*  CALCIUM 9.4 8.4* 8.2*   Liver Function Tests: Recent Labs  Lab 07/31/18 1911 08/01/18 0457 08/02/18 0517  AST 42* 60* 53*  ALT 32 46* 44  ALKPHOS 56 42 44  BILITOT 0.7 0.7 0.9  PROT 7.3 5.7* 5.5*  ALBUMIN 4.2 3.2* 2.9*   No results for input(s): LIPASE, AMYLASE in the last 168 hours. No results for input(s): AMMONIA in the last 168 hours. CBC: Recent Labs  Lab 07/31/18 1911 08/01/18 0457 08/02/18 0517  WBC 10.7* 9.0 8.7  NEUTROABS 10.0*  --   --   HGB 13.1 11.6* 11.0*  HCT 38.5 34.1* 31.9*  MCV 97.5 97.2 96.1  PLT 210 162 150   Cardiac Enzymes: No results for input(s): CKTOTAL, CKMB, CKMBINDEX, TROPONINI in the last 168 hours. BNP: Invalid input(s): POCBNP CBG: Recent Labs  Lab 08/01/18 0738 08/01/18 1623 08/01/18 2145 08/02/18 0730 08/02/18 1140  GLUCAP 130* 133* 182* 130* 169*   D-Dimer No results for input(s): DDIMER in the last 72 hours. Hgb A1c No results for input(s): HGBA1C in the last 72 hours. Lipid Profile No results for input(s): CHOL, HDL, LDLCALC, TRIG, CHOLHDL, LDLDIRECT in the last 72 hours. Thyroid function studies No results for input(s): TSH, T4TOTAL, T3FREE, THYROIDAB in the last 72 hours.  Invalid input(s): FREET3 Anemia work up No results for input(s): VITAMINB12, FOLATE, FERRITIN, TIBC, IRON, RETICCTPCT in the last 72 hours. Urinalysis    Component Value Date/Time   COLORURINE YELLOW 07/31/2018 1848   APPEARANCEUR HAZY (A) 07/31/2018 1848   LABSPEC 1.013 07/31/2018 1848   PHURINE 6.0 07/31/2018 1848   GLUCOSEU NEGATIVE 07/31/2018 1848   GLUCOSEU NEGATIVE 04/14/2018 1542   HGBUR SMALL (A) 07/31/2018 1848   BILIRUBINUR  NEGATIVE 07/31/2018 1848   BILIRUBINUR negative 03/27/2018 1553   KETONESUR NEGATIVE 07/31/2018 1848   PROTEINUR 100 (A) 07/31/2018 1848   UROBILINOGEN 0.2 04/14/2018 1542   NITRITE POSITIVE (A) 07/31/2018 1848   LEUKOCYTESUR LARGE (A) 07/31/2018 1848   Sepsis Labs Invalid input(s): PROCALCITONIN,  WBC,  LACTICIDVEN Microbiology Recent Results (from the past 240 hour(s))  Urine Culture     Status: Abnormal (Preliminary result)   Collection Time: 07/31/18  6:48 PM  Result Value Ref Range Status   Specimen Description   Final    URINE, RANDOM Performed at [facility], [place] [address], [place], [place] [address]    Special Requests   Final    NONE Performed at [facility], [place] [address], [place], [place] [address]    Culture >=100,000 COLONIES/mL GRAM NEGATIVE RODS (A)  Final   Report Status PENDING  Incomplete  Culture, blood (Routine x 2)     Status: None (Preliminary result)   Collection Time: 07/31/18  7:13 PM  Result Value Ref Range Status   Specimen Description   Final    BLOOD RIGHT ANTECUBITAL Performed at [facility] [address] [place], [place] [address]    Special Requests   Final    BOTTLES DRAWN AEROBIC AND ANAEROBIC Blood Culture adequate volume Performed at [facility] [address], [place], [place] [address]    Culture   Final    NO GROWTH < 24 HOURS Performed at [facility] [address], [place], [place] [address]    Report Status PENDING  Incomplete  Culture, blood (Routine x 2)     Status: None (  Preliminary result)   Collection Time: 07/31/18  7:13 PM  Result Value Ref Range Status   Specimen Description   Final    BLOOD RIGHT HAND Performed at [facility], [place] [address], [place], [place] [address]    Special Requests   Final    BOTTLES DRAWN AEROBIC AND ANAEROBIC Blood Culture results may not be optimal due to an inadequate volume of  blood received in culture bottles Performed at [facility] [address], [place], [place] [address]    Culture   Final    NO GROWTH < 24 HOURS Performed at [facility] [address], [place], [place] [address]    Report Status PENDING  Incomplete     Time coordinating discharge: 36 minutes  SIGNED:   Georgette Shell, MD  [facility] 08/02/2018, 12:27 PM Pager   If 7PM-7AM, please contact night-coverage [email or web address] Password [email or web address]

## 2018-08-02 NOTE — Evaluation (Signed)
Physical Therapy Evaluation Patient Details Name: [name] MRN: [id number] DOB: 1919/09/01 Today's Date: 08/02/2018   History of Present Illness  84 y.o. female with PMHx significant for hypertension, hyperlipidemia, Dm2,  Hypothyroidism, gout, glaucoma, breast cancer and admitted for sepsis secondary to UTI  Clinical Impression  Pt admitted with above diagnosis. Pt currently with functional limitations due to the deficits listed below (see PT Problem List).  Pt will benefit from skilled PT to increase their independence and safety with mobility to allow discharge to the venue listed below.   Pt up in recliner on arrival and assisted with ambulating in hallway with RW.  Pt reports using SPC and furniture walking at home.  Daughter lives across the street and assists for a few hours each day.  Pt considering HHPT upon d/c (would benefit from at least home safety eval).     Follow Up Recommendations Home health PT;Supervision for mobility/OOB    Equipment Recommendations  None recommended by PT    Recommendations for Other Services       Precautions / Restrictions Precautions Precautions: Fall Precaution Comments: very HOH      Mobility  Bed Mobility               General bed mobility comments: pt up in recliner on arrival  Transfers Overall transfer level: Needs assistance Equipment used: Rolling walker (2 wheeled) Transfers: Sit to/from Stand Sit to Stand: Min guard         General transfer comment: min/guard for safety, cues for hand placement as pt attempted to pull up on RW  Ambulation/Gait Ambulation/Gait assistance: Min guard Gait Distance (Feet): 100 Feet Assistive device: Rolling walker (2 wheeled) Gait Pattern/deviations: Step-through pattern;Decreased stride length;Trunk flexed     General Gait Details: verbal cues for RW positioning, distance to tolerance, reports neuropathy pain limiting distance  Stairs            Wheelchair Mobility     Modified Rankin (Stroke Patients Only)       Balance Overall balance assessment: Needs assistance(denies any recent falls)         Standing balance support: Bilateral upper extremity supported Standing balance-Leahy Scale: Poor                               Pertinent Vitals/Pain Pain Assessment: No/denies pain    Home Living Family/patient expects to be discharged to:: Private residence Living Arrangements: Alone;Other (Comment)(daughter lives across the street) Available Help at Discharge: Family;Available PRN/intermittently(daughter stays with her 3 hrs/day) Type of Home: House       Home Layout: One level Home Equipment: [place] - 2 wheels;Cane - single point      Prior Function Level of Independence: Independent with assistive device(s)         Comments: usually uses SPC inside home as well as reports "furniture walking"     Hand Dominance        Extremity/Trunk Assessment        Lower Extremity Assessment Lower Extremity Assessment: Generalized weakness;RLE deficits/detail;LLE deficits/detail RLE Sensation: history of peripheral neuropathy LLE Sensation: history of peripheral neuropathy       Communication   Communication: HOH  Cognition Arousal/Alertness: Awake/alert Behavior During Therapy: WFL for tasks assessed/performed Overall Cognitive Status: Within Functional Limits for tasks assessed  General Comments      Exercises     Assessment/Plan    PT Assessment Patient needs continued PT services  PT Problem List Decreased mobility;Decreased strength;Decreased activity tolerance;Decreased knowledge of use of DME       PT Treatment Interventions Gait training;DME instruction;Therapeutic activities;Therapeutic exercise;Patient/family education;Functional mobility training    PT Goals (Current goals can be found in the Care Plan section)  Acute Rehab PT Goals PT Goal  Formulation: With patient/family Time For Goal Achievement: 08/09/18 Potential to Achieve Goals: Good    Frequency Min 3X/week   Barriers to discharge        Co-evaluation               AM-PAC PT "6 Clicks" Daily Activity  Outcome Measure Difficulty turning over in bed (including adjusting bedclothes, sheets and blankets)?: A Little Difficulty moving from lying on back to sitting on the side of the bed? : A Little Difficulty sitting down on and standing up from a chair with arms (e.g., wheelchair, bedside commode, etc,.)?: A Little Help needed moving to and from a bed to chair (including a wheelchair)?: A Little Help needed walking in hospital room?: A Little Help needed climbing 3-5 steps with a railing? : A Little 6 Click Score: 18    End of Session Equipment Utilized During Treatment: Gait belt Activity Tolerance: Patient tolerated treatment well Patient left: in chair;with call bell/phone within reach;with family/visitor present Nurse Communication: Mobility status PT Visit Diagnosis: Other abnormalities of gait and mobility (R26.89)    Time: 2671-2458 PT Time Calculation (min) (ACUTE ONLY): 11 min   Charges:   PT Evaluation $PT Eval Low Complexity: [place], PT, DPT Acute Rehabilitation Services Office: [phone] Pager: [phone]   Trena Platt 08/02/2018, 12:16 PM

## 2018-08-03 ENCOUNTER — Encounter: Payer: Self-pay | Admitting: Family Medicine

## 2018-08-03 ENCOUNTER — Telehealth: Payer: Self-pay | Admitting: Behavioral Health

## 2018-08-03 LAB — GLUCOSE, CAPILLARY: Glucose-Capillary: 101 mg/dL — ABNORMAL HIGH (ref 70–99)

## 2018-08-03 LAB — HEPATITIS PANEL, ACUTE
HCV Ab: <0.1 s/co ratio (ref 0.0–0.9)
Hep A IgM: NEGATIVE
Hep B C IgM: NEGATIVE
Hepatitis B Surface Ag: NEGATIVE

## 2018-08-03 NOTE — Telephone Encounter (Signed)
Transition Care Management Follow-up Telephone Call  PCP: [name], MD  Admit date: 07/31/2018 Discharge date: 08/02/2018  Admitted From:home Disposition:  Home Recommendations for Outpatient Follow-up:  1. Follow up with PCP in 1-2 weeks 2. Please obtain CMP/CBC in one week to follow-up on liver function test and renal function test.  Home Health:pt Equipment/Devices none  Discharge Condition none   How have you been since you were released from the hospital? [name], [name], stated "She seems to be doing well, beginning to get strength back in her legs, ambulating with her walker & increasing fluids."   Do you understand why you were in the hospital? yes   Do you understand the discharge instructions? yes   Where were you discharged to? Home; patient's daughter lives across the street & assists when necessary.   Items Reviewed:  Medications reviewed: yes  Allergies reviewed: yes, NKA  Dietary changes reviewed: yes, per daughter the patient has been consuming a heart healthy diet.  Referrals reviewed: yes, Follow up with PCP in 1-2 weeks; Please obtain CMP/CBC in one week to follow-up on liver function test and renal function test.   Functional Questionnaire:   Activities of Daily Living (ADLs):   She states they are independent in the following: ambulation, bathing and hygiene, feeding, continence, grooming, toileting and dressing States they require assistance with the following: None   Any transportation issues/concerns?: no, patient's daughter will bring her to the appointment on this Thursday.   Any patient concerns? yes, please see Patient Message in chart dated 08/03/18, regarding medication concerns.   Confirmed importance and date/time of follow-up visits scheduled yes, 08/06/18 at 11:30 AM.  Provider Appointment booked with Dr. Ethelene Hal.  Confirmed with patient if condition begins to worsen call PCP or go to the ER.   Patient was given the office number and encouraged to call back with question or concerns.  : yes

## 2018-08-04 ENCOUNTER — Other Ambulatory Visit: Payer: Self-pay | Admitting: Family Medicine

## 2018-08-04 ENCOUNTER — Telehealth: Payer: Self-pay | Admitting: Family Medicine

## 2018-08-04 LAB — URINE CULTURE: Culture: >=100000 — AB

## 2018-08-04 MED ORDER — CIPROFLOXACIN HCL 500 MG PO TABS: 500.0000 mg | ORAL_TABLET | Freq: Two times a day (BID) | ORAL | 0 refills | Status: DC

## 2018-08-04 NOTE — Telephone Encounter (Signed)
Copied from [place] [id number]. Topic: Quick Communication - Rx Refill/Question >> Aug 04, 2018 12:17 PM Sheran Luz wrote: Medication: ciprofloxacin (CIPRO)   Helane Gunther, Pharmacist with [facility] states that they have received two separate RX for this medication. One from Dr. Ethelene Hal for 500mg  and one from Dr. Landis Gandy for 250mg . Pharmacy needs clarification on which one has the correct dosage. Please advise.   Preferred Pharmacy (with phone number or street name):   [facility] [address], [place] [phone] (Phone) [phone] (Fax)

## 2018-08-04 NOTE — Telephone Encounter (Signed)
Pharmacy aware

## 2018-08-04 NOTE — Telephone Encounter (Signed)
Please advise 

## 2018-08-04 NOTE — Telephone Encounter (Signed)
Go with dose as prescribed by hospitalist.

## 2018-08-05 ENCOUNTER — Ambulatory Visit (INDEPENDENT_AMBULATORY_CARE_PROVIDER_SITE_OTHER): Payer: Medicare Other | Admitting: Podiatry

## 2018-08-05 ENCOUNTER — Encounter: Payer: Self-pay | Admitting: Podiatry

## 2018-08-05 DIAGNOSIS — M79674 Pain in right toe(s): Secondary | ICD-10-CM

## 2018-08-05 DIAGNOSIS — B351 Tinea unguium: Secondary | ICD-10-CM | POA: Diagnosis not present

## 2018-08-05 DIAGNOSIS — M79675 Pain in left toe(s): Secondary | ICD-10-CM

## 2018-08-05 DIAGNOSIS — E0843 Diabetes mellitus due to underlying condition with diabetic autonomic (poly)neuropathy: Secondary | ICD-10-CM

## 2018-08-05 LAB — CULTURE, BLOOD (ROUTINE X 2)
CULTURE: NO GROWTH
Culture: NO GROWTH
Special Requests: ADEQUATE

## 2018-08-06 ENCOUNTER — Encounter: Payer: Self-pay | Admitting: Family Medicine

## 2018-08-06 ENCOUNTER — Ambulatory Visit (INDEPENDENT_AMBULATORY_CARE_PROVIDER_SITE_OTHER): Payer: Medicare Other | Admitting: Family Medicine

## 2018-08-06 VITALS — BP 138/70 | HR 66 | Temp 97.9°F | Ht 67.0 in

## 2018-08-06 DIAGNOSIS — N179 Acute kidney failure, unspecified: Secondary | ICD-10-CM | POA: Diagnosis not present

## 2018-08-06 DIAGNOSIS — Z09 Encounter for follow-up examination after completed treatment for conditions other than malignant neoplasm: Secondary | ICD-10-CM | POA: Diagnosis not present

## 2018-08-06 DIAGNOSIS — R945 Abnormal results of liver function studies: Secondary | ICD-10-CM | POA: Diagnosis not present

## 2018-08-06 DIAGNOSIS — E860 Dehydration: Secondary | ICD-10-CM | POA: Diagnosis not present

## 2018-08-06 DIAGNOSIS — N390 Urinary tract infection, site not specified: Secondary | ICD-10-CM | POA: Diagnosis not present

## 2018-08-06 LAB — BASIC METABOLIC PANEL WITH GFR
BUN: 21 mg/dL (ref 6–23)
CO2: 27 meq/L (ref 19–32)
Calcium: 9.5 mg/dL (ref 8.4–10.5)
Chloride: 97 meq/L (ref 96–112)
Creatinine, Ser: 1.17 mg/dL (ref 0.40–1.20)
GFR: 45.49 mL/min — ABNORMAL LOW
Glucose, Bld: 180 mg/dL — ABNORMAL HIGH (ref 70–99)
Potassium: 4.5 meq/L (ref 3.5–5.1)
Sodium: 135 meq/L (ref 135–145)

## 2018-08-06 LAB — HEPATIC FUNCTION PANEL
ALK PHOS: 54 U/L (ref 39–117)
ALT: 26 U/L (ref 0–35)
AST: 25 U/L (ref 0–37)
Albumin: 3.8 g/dL (ref 3.5–5.2)
BILIRUBIN DIRECT: 0.1 mg/dL (ref 0.0–0.3)
BILIRUBIN TOTAL: 0.6 mg/dL (ref 0.2–1.2)
Total Protein: 6.5 g/dL (ref 6.0–8.3)

## 2018-08-06 NOTE — Progress Notes (Signed)
Subjective:  Patient ID: [name], female    DOB: 04/22/1919  Age: 84 y.o. MRN: [id number]  CC: Hospitalization Follow-up   HPI [name] presents for hospital discharge follow-up.  Patient admitted to hospital rule out sepsis and was subsequently diagnosed with acute UTI.  Mild elevation in LFTs with declining GFR were noted on admission.  Blood cultures have remained negative status post discharge but urine culture was positive for Pseudomonas sensitive to Cipro.  Hospitalist called in prescription for Cipro.  Patient was advised to stop her 1-2 nightly Tylenol as well as her atorvastatin.  She is feeling much better.  She experience nocturia last night.  She has had no fevers chills nausea or vomiting flank pain.  Outpatient Medications Prior to Visit  Medication Sig Dispense Refill  . acebutolol (SECTRAL) 400 MG capsule Take 1 capsule (400 mg total) by mouth at bedtime. 90 capsule 1  . amLODipine (NORVASC) 5 MG tablet Take 1 tablet (5 mg total) by mouth every morning. 90 tablet 1  . gabapentin (NEURONTIN) 100 MG capsule Take 2 capsules (200 mg total) by mouth 3 (three) times daily. (Patient taking differently: Take 100-200 mg by mouth 2 (two) times daily. 200 mg in the am and 100 mg at bedtime) 180 capsule 5  . glimepiride (AMARYL) 1 MG tablet Take 1 tablet (1 mg total) by mouth every morning. 30 tablet 11  . glucose blood (TRUETRACK TEST) test strip Use as instructed to check blood sugar once a day.  Dx E11.9 100 each 1  . hydroxypropyl methylcellulose / hypromellose (ISOPTO TEARS / GONIOVISC) 2.5 % ophthalmic solution Place 1 drop into both eyes 3 (three) times daily as needed for dry eyes.    [name] levothyroxine (SYNTHROID, LEVOTHROID) 100 MCG tablet Take 100 mcg by mouth daily before breakfast.    . sitaGLIPtin (JANUVIA) 50 MG tablet Take 1 tablet (50 mg total) by mouth every morning. 90 tablet 1  . thiamine (VITAMIN B-1) 100 MG tablet Take 100 mg by mouth daily.    . TRUEPLUS  LANCETS 33G MISC Use to check blood sugar once a day. Dx E11.9 100 each 1  . diclofenac sodium (VOLTAREN) 1 % GEL Apply 2 g topically 4 (four) times daily as needed (pain).     No facility-administered medications prior to visit.     ROS Review of Systems  Constitutional: Negative for chills, fatigue, fever and unexpected weight change.  Respiratory: Negative for chest tightness and shortness of breath.   Cardiovascular: Negative for chest pain and palpitations.  Gastrointestinal: Negative.  Negative for abdominal distention, abdominal pain and vomiting.  Endocrine: Negative for polyphagia and polyuria.  Genitourinary: Positive for frequency. Negative for dysuria, flank pain and hematuria.  Skin: Negative for pallor.  Neurological: Negative for weakness and numbness.  Psychiatric/Behavioral: Negative.     Objective:  BP 138/70   Pulse 66   Temp 97.9 F (36.6 C)   Ht 5\' 7"  (1.702 m)   SpO2 95%   BMI 28.97 kg/m   BP Readings from Last 3 Encounters:  08/06/18 138/70  08/02/18 (!) 140/56  07/09/18 124/80    Wt Readings from Last 3 Encounters:  08/02/18 184 lb 15.5 oz (83.9 kg)  07/09/18 183 lb (83 kg)  04/14/18 185 lb 8 oz (84.1 kg)    Physical Exam  Constitutional: She is oriented to person, place, and time. She appears well-developed and well-nourished. No distress.  HENT:  Head: Normocephalic and atraumatic.  Right Ear:  External ear normal.  Left Ear: External ear normal.  Mouth/Throat: Oropharynx is clear and moist. No oropharyngeal exudate.  Eyes: Pupils are equal, round, and reactive to light. Conjunctivae and EOM are normal. Right eye exhibits no discharge. Left eye exhibits no discharge. No scleral icterus.  Neck: No JVD present. No tracheal deviation present.  Cardiovascular: Normal rate, regular rhythm and normal heart sounds.  Pulmonary/Chest: Effort normal and breath sounds normal.  Neurological: She is alert and oriented to person, place, and time.  Skin:  Skin is warm and dry. She is not diaphoretic.  Psychiatric: She has a normal mood and affect.    Lab Results  Component Value Date   WBC 8.7 08/02/2018   HGB 11.0 (L) 08/02/2018   HCT 31.9 (L) 08/02/2018   PLT 150 08/02/2018   GLUCOSE 147 (H) 08/02/2018   CHOL 166 04/07/2018   TRIG 368.0 (H) 04/07/2018   HDL 42.50 04/07/2018   LDLDIRECT 78.0 07/09/2018   LDLCALC 93 03/12/2016   ALT 44 08/02/2018   AST 53 (H) 08/02/2018   NA 134 (L) 08/02/2018   K 3.5 08/02/2018   CL 102 08/02/2018   CREATININE 1.31 (H) 08/02/2018   BUN 23 08/02/2018   CO2 23 08/02/2018   TSH 1.92 07/09/2018   INR 0.99 07/31/2018   HGBA1C 6.8 (H) 07/09/2018   MICROALBUR 9.9 (H) 04/23/2016    Dg Chest 2 View  Result Date: 07/31/2018 CLINICAL DATA:  Fever.  Altered mental status. EXAM: CHEST - 2 VIEW COMPARISON:  11/17/2011 FINDINGS: Lateral view degraded by patient arm position. Mild hyperinflation. Midline trachea. Mild cardiomegaly. Atherosclerosis in the transverse aorta. No pleural effusion or pneumothorax. Mild biapical pleuroparenchymal scarring. No lobar consolidation. No congestive failure. IMPRESSION: No acute cardiopulmonary disease. Cardiomegaly without congestive failure. Electronically Signed   By: Abigail Miyamoto M.D.   On: 07/31/2018 19:11   US Abdomen Limited Ruq  Result Date: 08/01/2018 CLINICAL DATA:  Abnormal liver function tests EXAM: ULTRASOUND ABDOMEN LIMITED RIGHT UPPER QUADRANT COMPARISON:  None. FINDINGS: Gallbladder: Surgically absent Common bile duct: Diameter: 4 mm.  Where visualized, no filling defect. Liver: No focal lesion identified. Within normal limits in parenchymal echogenicity. Portal vein is patent on color Doppler imaging with normal direction of blood flow towards the liver. IMPRESSION: No acute finding or explanation for the history. Cholecystectomy. Electronically Signed   By: Monte Fantasia M.D.   On: 08/01/2018 08:59    Assessment & Plan:   [name] was seen today for  hospitalization follow-up.  Diagnoses and all orders for this visit:  Hospital discharge follow-up  AKI (acute kidney injury) ([place]) -     Basic metabolic panel  Abnormal liver function -     Hepatic function panel  Acute lower UTI  Dehydration   I have discontinued Cheryl Pikes. [place] "Elaine"'s diclofenac sodium. I am also having her maintain her acebutolol, amLODipine, gabapentin, glucose blood, sitaGLIPtin, TRUEPLUS LANCETS 33G, hydroxypropyl methylcellulose / hypromellose, levothyroxine, thiamine, and glimepiride.  No orders of the defined types were placed in this encounter.  Patient to complete her course of Cipro.  Encouraged hydration and spent several minutes discussing the importance of this with regard to the maintenance of her renal function.  Last GFR noted was at 32.  Explained the significance of this.  Okay to restart of Tylenol and Lipitor.  She is only taking 1-2 Tylenol in the evening for sleep.  Follow-up: Return in about 1 month (around 09/05/2018).  [name], MD

## 2018-08-07 NOTE — Progress Notes (Signed)
   SUBJECTIVE Patient with a history of diabetes mellitus presents to office today complaining of elongated, thickened nails that cause pain while ambulating in shoes. She is unable to trim her own nails. Patient is here for further evaluation and treatment.   Past Medical History:  Diagnosis Date  . Anxiety    loss of child 1 year ago  . Cancer ([place])    right breast  . Diabetes mellitus, type 2 ([place])   . Glaucoma   . Gout   . Hyperlipidemia   . Hypertension    states eccho Dr Mare Ferrari 1 year ago- no record found in [facility] or office records  . Hyperthyroidism     OBJECTIVE General Patient is awake, alert, and oriented x 3 and in no acute distress. Derm Skin is dry and supple bilateral. Negative open lesions or macerations. Remaining integument unremarkable. Nails are tender, long, thickened and dystrophic with subungual debris, consistent with onychomycosis, 1-5 bilateral. No signs of infection noted. Vasc  DP and PT pedal pulses palpable bilaterally. Temperature gradient within normal limits.  Neuro Epicritic and protective threshold sensation diminished bilaterally.  Musculoskeletal Exam No symptomatic pedal deformities noted bilateral. Muscular strength within normal limits.  ASSESSMENT 1. Diabetes Mellitus w/ peripheral neuropathy 2. Onychomycosis of nail due to dermatophyte bilateral 3. Pain in foot bilateral  PLAN OF CARE 1. Patient evaluated today. 2. Instructed to maintain good pedal hygiene and foot care. Stressed importance of controlling blood sugar.  3. Mechanical debridement of nails 1-5 bilaterally performed using a nail nipper. Filed with dremel without incident.  4. Return to clinic in 3 mos.     Edrick Kins, DPM [facility]  Dr. Edrick Kins, [address]                                        [place], [place] [address]                Office [phone]  Fax [phone]

## 2018-09-08 ENCOUNTER — Ambulatory Visit (INDEPENDENT_AMBULATORY_CARE_PROVIDER_SITE_OTHER): Payer: Medicare Other | Admitting: Family Medicine

## 2018-09-08 ENCOUNTER — Encounter: Payer: Self-pay | Admitting: Family Medicine

## 2018-09-08 VITALS — BP 136/80 | HR 70 | Temp 97.5°F | Ht 67.0 in

## 2018-09-08 DIAGNOSIS — E039 Hypothyroidism, unspecified: Secondary | ICD-10-CM

## 2018-09-08 DIAGNOSIS — Z23 Encounter for immunization: Secondary | ICD-10-CM

## 2018-09-08 DIAGNOSIS — E119 Type 2 diabetes mellitus without complications: Secondary | ICD-10-CM

## 2018-09-08 DIAGNOSIS — N183 Chronic kidney disease, stage 3 unspecified: Secondary | ICD-10-CM

## 2018-09-08 DIAGNOSIS — I10 Essential (primary) hypertension: Secondary | ICD-10-CM | POA: Diagnosis not present

## 2018-09-08 LAB — URINALYSIS, ROUTINE W REFLEX MICROSCOPIC
Bilirubin Urine: NEGATIVE
Hgb urine dipstick: NEGATIVE
Ketones, ur: NEGATIVE
Leukocytes, UA: NEGATIVE
Nitrite: NEGATIVE
RBC / HPF: NONE SEEN (ref 0–?)
SPECIFIC GRAVITY, URINE: 1.01 (ref 1.000–1.030)
Total Protein, Urine: 30 — AB
Urine Glucose: NEGATIVE
Urobilinogen, UA: 0.2 (ref 0.0–1.0)
pH: 7 (ref 5.0–8.0)

## 2018-09-08 LAB — BASIC METABOLIC PANEL
BUN: 26 mg/dL — AB (ref 6–23)
CALCIUM: 9.6 mg/dL (ref 8.4–10.5)
CO2: 28 mEq/L (ref 19–32)
Chloride: 98 mEq/L (ref 96–112)
Creatinine, Ser: 1.31 mg/dL — ABNORMAL HIGH (ref 0.40–1.20)
GFR: 39.92 mL/min — ABNORMAL LOW (ref >60.00)
GLUCOSE: 247 mg/dL — AB (ref 70–99)
Potassium: 4.2 mEq/L (ref 3.5–5.1)
SODIUM: 135 meq/L (ref 135–145)

## 2018-09-08 LAB — HEMOGLOBIN A1C: HEMOGLOBIN A1C: 6.7 % — AB (ref 4.6–6.5)

## 2018-09-08 NOTE — Progress Notes (Signed)
Subjective:  Patient ID: [name], female    DOB: 1919-11-06  Age: 84 y.o. MRN: [id number]  CC: Follow-up   HPI [name] presents for follow-up of her hypertension, hypothyroidism diabetes and chronic kidney disease.  She has done well since her recent hospitalization for near urosepsis.  She has been hydrating as well as she can.  She admits to dependence on her walker for ambulation.  Her blood pressure is been controlled for many years on [facility] all.  Last hemoglobin A1c 5 months ago was 7.1.  TSH 2 months ago was 1.92.  She takes up to 3 100 mg Neurontin tablets as needed daily for painful neuropathy in both of her feet.  She rarely requires the total dosage.  Outpatient Medications Prior to Visit  Medication Sig Dispense Refill  . acebutolol (SECTRAL) 400 MG capsule Take 1 capsule (400 mg total) by mouth at bedtime. 90 capsule 1  . amLODipine (NORVASC) 5 MG tablet Take 1 tablet (5 mg total) by mouth every morning. 90 tablet 1  . atorvastatin (LIPITOR) 10 MG tablet Take 10 mg by mouth daily.    [name] gabapentin (NEURONTIN) 100 MG capsule Take 2 capsules (200 mg total) by mouth 3 (three) times daily. (Patient taking differently: Take 100-200 mg by mouth 2 (two) times daily. 200 mg in the am and 100 mg at bedtime) 180 capsule 5  . glimepiride (AMARYL) 1 MG tablet Take 1 tablet (1 mg total) by mouth every morning. 30 tablet 11  . glucose blood (TRUETRACK TEST) test strip Use as instructed to check blood sugar once a day.  Dx E11.9 100 each 1  . hydroxypropyl methylcellulose / hypromellose (ISOPTO TEARS / GONIOVISC) 2.5 % ophthalmic solution Place 1 drop into both eyes 3 (three) times daily as needed for dry eyes.    [name] levothyroxine (SYNTHROID, LEVOTHROID) 100 MCG tablet Take 100 mcg by mouth daily before breakfast.    . sitaGLIPtin (JANUVIA) 50 MG tablet Take 1 tablet (50 mg total) by mouth every morning. 90 tablet 1  . thiamine (VITAMIN B-1) 100 MG tablet Take 100 mg by mouth daily.      . TRUEPLUS LANCETS 33G MISC Use to check blood sugar once a day. Dx E11.9 100 each 1   No facility-administered medications prior to visit.     ROS Review of Systems  Constitutional: Negative for chills, diaphoresis, fatigue, fever and unexpected weight change.  HENT: Negative.   Eyes: Negative for photophobia and visual disturbance.  Respiratory: Negative.   Cardiovascular: Negative.   Gastrointestinal: Negative.   Endocrine: Negative for polyphagia and polyuria.  Genitourinary: Negative.   Musculoskeletal: Positive for gait problem.  Skin: Negative for pallor and rash.  Allergic/Immunologic: Negative for immunocompromised state.  Neurological: Positive for numbness. Negative for seizures, syncope, speech difficulty and headaches.  Hematological: Does not bruise/bleed easily.  Psychiatric/Behavioral: Negative.     Objective:  BP 136/80   Pulse 70   Temp (!) 97.5 F (36.4 C) (Oral)   Ht 5\' 7"  (1.702 m)   SpO2 95%   BMI 28.97 kg/m   BP Readings from Last 3 Encounters:  09/08/18 136/80  08/06/18 138/70  08/02/18 (!) 140/56    Wt Readings from Last 3 Encounters:  08/02/18 184 lb 15.5 oz (83.9 kg)  07/09/18 183 lb (83 kg)  04/14/18 185 lb 8 oz (84.1 kg)    Physical Exam  Constitutional: She is oriented to person, place, and time. She appears well-developed and well-nourished.  No distress.  HENT:  Head: Normocephalic and atraumatic.  Right Ear: External ear normal.  Left Ear: External ear normal.  Mouth/Throat: Oropharynx is clear and moist.  Eyes: Right eye exhibits no discharge. Left eye exhibits no discharge. No scleral icterus.  Neck: No tracheal deviation present.  Cardiovascular: Normal rate, regular rhythm and normal heart sounds.  Pulmonary/Chest: Effort normal and breath sounds normal.  Abdominal: Bowel sounds are normal.  Neurological: She is alert and oriented to person, place, and time.  Skin: Skin is warm and dry. She is not diaphoretic.   Psychiatric: She has a normal mood and affect. Her behavior is normal.    Lab Results  Component Value Date   WBC 8.7 08/02/2018   HGB 11.0 (L) 08/02/2018   HCT 31.9 (L) 08/02/2018   PLT 150 08/02/2018   GLUCOSE 180 (H) 08/06/2018   CHOL 166 04/07/2018   TRIG 368.0 (H) 04/07/2018   HDL 42.50 04/07/2018   LDLDIRECT 78.0 07/09/2018   LDLCALC 93 03/12/2016   ALT 26 08/06/2018   AST 25 08/06/2018   NA 135 08/06/2018   K 4.5 08/06/2018   CL 97 08/06/2018   CREATININE 1.17 08/06/2018   BUN 21 08/06/2018   CO2 27 08/06/2018   TSH 1.92 07/09/2018   INR 0.99 07/31/2018   HGBA1C 6.8 (H) 07/09/2018   MICROALBUR 9.9 (H) 04/23/2016    Dg Chest 2 View  Result Date: 07/31/2018 CLINICAL DATA:  Fever.  Altered mental status. EXAM: CHEST - 2 VIEW COMPARISON:  11/17/2011 FINDINGS: Lateral view degraded by patient arm position. Mild hyperinflation. Midline trachea. Mild cardiomegaly. Atherosclerosis in the transverse aorta. No pleural effusion or pneumothorax. Mild biapical pleuroparenchymal scarring. No lobar consolidation. No congestive failure. IMPRESSION: No acute cardiopulmonary disease. Cardiomegaly without congestive failure. Electronically Signed   By: Abigail Miyamoto M.D.   On: 07/31/2018 19:11   US Abdomen Limited Ruq  Result Date: 08/01/2018 CLINICAL DATA:  Abnormal liver function tests EXAM: ULTRASOUND ABDOMEN LIMITED RIGHT UPPER QUADRANT COMPARISON:  None. FINDINGS: Gallbladder: Surgically absent Common bile duct: Diameter: 4 mm.  Where visualized, no filling defect. Liver: No focal lesion identified. Within normal limits in parenchymal echogenicity. Portal vein is patent on color Doppler imaging with normal direction of blood flow towards the liver. IMPRESSION: No acute finding or explanation for the history. Cholecystectomy. Electronically Signed   By: Monte Fantasia M.D.   On: 08/01/2018 08:59    Assessment & Plan:   [name] was seen today for follow-up.  Diagnoses and all orders  for this visit:  Essential hypertension -     Basic metabolic panel -     Urinalysis, Routine w reflex microscopic  Need for influenza vaccination -     Flu vaccine HIGH DOSE PF  Hypothyroidism, unspecified type  Controlled type 2 diabetes mellitus without complication, without long-term current use of insulin (HCC) -     Basic metabolic panel -     Hemoglobin A1c -     Urinalysis, Routine w reflex microscopic  CKD (chronic kidney disease) stage 3, GFR 30-59 ml/min (HCC) -     Basic metabolic panel -     Urinalysis, Routine w reflex microscopic   I am having Cheryl Pikes. [place] "Margaretha Sheffield" maintain her acebutolol, amLODipine, gabapentin, glucose blood, sitaGLIPtin, TRUEPLUS LANCETS 33G, hydroxypropyl methylcellulose / hypromellose, levothyroxine, thiamine, glimepiride, and atorvastatin.  No orders of the defined types were placed in this encounter.  She will continue to use her walker for all ambulation.  Encouraged  her to be up and about as much as tolerated.  Asked her to continue hydration with water as much as she can.  Follow-up in 3 months or sooner as needed.  Follow-up: Return in about 3 months (around 12/09/2018).  [name], MD

## 2018-09-08 NOTE — Addendum Note (Signed)
Addended by: Lynnea Ferrier on: 09/08/2018 01:41 PM   Modules accepted: Orders

## 2018-10-09 ENCOUNTER — Ambulatory Visit: Payer: Self-pay | Admitting: Family Medicine

## 2018-12-08 ENCOUNTER — Ambulatory Visit (INDEPENDENT_AMBULATORY_CARE_PROVIDER_SITE_OTHER): Payer: Medicare Other | Admitting: Family Medicine

## 2018-12-08 ENCOUNTER — Encounter: Payer: Self-pay | Admitting: Family Medicine

## 2018-12-08 VITALS — BP 136/80 | HR 68 | Ht 67.0 in | Wt 185.0 lb

## 2018-12-08 DIAGNOSIS — D539 Nutritional anemia, unspecified: Secondary | ICD-10-CM | POA: Diagnosis not present

## 2018-12-08 DIAGNOSIS — G6289 Other specified polyneuropathies: Secondary | ICD-10-CM | POA: Diagnosis not present

## 2018-12-08 DIAGNOSIS — N183 Chronic kidney disease, stage 3 unspecified: Secondary | ICD-10-CM

## 2018-12-08 DIAGNOSIS — E785 Hyperlipidemia, unspecified: Secondary | ICD-10-CM

## 2018-12-08 DIAGNOSIS — I10 Essential (primary) hypertension: Secondary | ICD-10-CM | POA: Diagnosis not present

## 2018-12-08 DIAGNOSIS — E039 Hypothyroidism, unspecified: Secondary | ICD-10-CM | POA: Diagnosis not present

## 2018-12-08 DIAGNOSIS — E119 Type 2 diabetes mellitus without complications: Secondary | ICD-10-CM | POA: Diagnosis not present

## 2018-12-08 LAB — TSH: TSH: 1.9 u[IU]/mL (ref 0.35–4.50)

## 2018-12-08 LAB — COMPREHENSIVE METABOLIC PANEL
ALT: 12 U/L (ref 0–35)
AST: 18 U/L (ref 0–37)
Albumin: 4.2 g/dL (ref 3.5–5.2)
Alkaline Phosphatase: 52 U/L (ref 39–117)
BUN: 25 mg/dL — ABNORMAL HIGH (ref 6–23)
CO2: 25 mEq/L (ref 19–32)
Calcium: 9.6 mg/dL (ref 8.4–10.5)
Chloride: 97 mEq/L (ref 96–112)
Creatinine, Ser: 1.28 mg/dL — ABNORMAL HIGH (ref 0.40–1.20)
GFR: 38.56 mL/min — ABNORMAL LOW (ref >60.00)
Glucose, Bld: 100 mg/dL — ABNORMAL HIGH (ref 70–99)
Potassium: 4 mEq/L (ref 3.5–5.1)
Sodium: 132 mEq/L — ABNORMAL LOW (ref 135–145)
Total Bilirubin: 0.7 mg/dL (ref 0.2–1.2)
Total Protein: 7.4 g/dL (ref 6.0–8.3)

## 2018-12-08 LAB — URINALYSIS, ROUTINE W REFLEX MICROSCOPIC
Bilirubin Urine: NEGATIVE
Hgb urine dipstick: NEGATIVE
Ketones, ur: NEGATIVE
Leukocytes, UA: NEGATIVE
Nitrite: NEGATIVE
RBC / HPF: NONE SEEN (ref 0–?)
Specific Gravity, Urine: 1.01 (ref 1.000–1.030)
Total Protein, Urine: 100 — AB
Urine Glucose: NEGATIVE
Urobilinogen, UA: 0.2 (ref 0.0–1.0)
WBC, UA: NONE SEEN (ref 0–?)
pH: 7 (ref 5.0–8.0)

## 2018-12-08 LAB — CBC
HEMATOCRIT: 41 % (ref 36.0–46.0)
Hemoglobin: 14 g/dL (ref 12.0–15.0)
MCHC: 34.1 g/dL (ref 30.0–36.0)
MCV: 97.5 fl (ref 78.0–100.0)
Platelets: 224 10*3/uL (ref 150.0–400.0)
RBC: 4.21 Mil/uL (ref 3.87–5.11)
RDW: 12.6 % (ref 11.5–15.5)
WBC: 9.6 10*3/uL (ref 4.0–10.5)

## 2018-12-08 LAB — LDL CHOLESTEROL, DIRECT: LDL DIRECT: 96 mg/dL

## 2018-12-08 LAB — HEMOGLOBIN A1C: Hgb A1c MFr Bld: 7 % — ABNORMAL HIGH (ref 4.6–6.5)

## 2018-12-08 MED ORDER — AMLODIPINE BESYLATE 5 MG PO TABS: 5.0000 mg | ORAL_TABLET | ORAL | 1 refills | Status: DC

## 2018-12-08 MED ORDER — LEVOTHYROXINE SODIUM 100 MCG PO TABS: 100.0000 ug | ORAL_TABLET | Freq: Every day | ORAL | 1 refills | Status: DC

## 2018-12-08 MED ORDER — GABAPENTIN 100 MG PO CAPS: 100.0000 mg | ORAL_CAPSULE | Freq: Three times a day (TID) | ORAL | 5 refills | Status: DC

## 2018-12-08 MED ORDER — SITAGLIPTIN PHOSPHATE 50 MG PO TABS: 50.0000 mg | ORAL_TABLET | ORAL | 1 refills | Status: DC

## 2018-12-08 MED ORDER — ATORVASTATIN CALCIUM 10 MG PO TABS: 10.0000 mg | ORAL_TABLET | Freq: Every day | ORAL | 1 refills | Status: DC

## 2018-12-08 MED ORDER — TELMISARTAN 80 MG PO TABS: 80.0000 mg | ORAL_TABLET | Freq: Every day | ORAL | 1 refills | Status: DC

## 2018-12-08 MED ORDER — GLIMEPIRIDE 1 MG PO TABS: 1.0000 mg | ORAL_TABLET | ORAL | 11 refills | Status: DC

## 2018-12-08 MED ORDER — ACEBUTOLOL HCL 400 MG PO CAPS: 400.0000 mg | ORAL_CAPSULE | Freq: Every day | ORAL | 1 refills | Status: DC

## 2018-12-08 NOTE — Addendum Note (Signed)
Addended by: Kateri Mc E on: 12/08/2018 01:53 PM   Modules accepted: Orders

## 2018-12-08 NOTE — Progress Notes (Signed)
Established Patient Office Visit  Subjective:  Patient ID: [name], female    DOB: Jun 06, 1919  Age: 85 y.o. MRN: [id number]  CC:  Chief Complaint  Patient presents with  . Follow-up    HPI [name] presents for follow-up of her hypertension that has been controlled with Sectral amlodipine and Micardis.  Follow-up of stage III CKD today.  Continues to use Neurontin as needed for her polyneuropathy.  She does use her walker at home for ambulation.  She is taking her Synthroid in the morning before eating.  She assures me that she is eating after taking her Amaryl.  Continues to live independently with her watchful daughter living across the street.  Past Medical History:  Diagnosis Date  . Anxiety    loss of child 1 year ago  . Cancer ([place])    right breast  . Diabetes mellitus, type 2 ([place])   . Glaucoma   . Gout   . Hyperlipidemia   . Hypertension    states eccho Dr Mare Ferrari 1 year ago- no record found in [facility] or office records  . Hyperthyroidism     Past Surgical History:  Procedure Laterality Date  . ABDOMINAL HYSTERECTOMY  1995  . BREAST LUMPECTOMY  11/06/2011   Procedure: LUMPECTOMY;  Surgeon: Harl Bowie, MD;  Location: [facility];  Service: General;  Laterality: Right;  . CHOLECYSTECTOMY  1976  . ROTATOR CUFF REPAIR  1999   right    Family History  Problem Relation Age of Onset  . Cancer Mother        bladder  . Kidney disease Mother   . Heart attack Brother   . Stroke Father   . Hypertension Sister   . Hyperlipidemia Daughter   . Diabetes Sister     Social History   Socioeconomic History  . Marital status: Widowed    Spouse name: Not on file  . Number of children: Not on file  . Years of education: Not on file  . Highest education level: Not on file  Occupational History  . Not on file  Social Needs  . Financial resource strain: Not on file  . Food insecurity:    Worry: Not on file    Inability: Not on file  . Transportation needs:     Medical: Not on file    Non-medical: Not on file  Tobacco Use  . Smoking status: Never Smoker  . Smokeless tobacco: Never Used  Substance and Sexual Activity  . Alcohol use: No  . Drug use: No  . Sexual activity: Not on file  Lifestyle  . Physical activity:    Days per week: Not on file    Minutes per session: Not on file  . Stress: Not on file  Relationships  . Social connections:    Talks on phone: Not on file    Gets together: Not on file    Attends religious service: Not on file    Active member of club or organization: Not on file    Attends meetings of clubs or organizations: Not on file    Relationship status: Not on file  . Intimate partner violence:    Fear of current or ex partner: Not on file    Emotionally abused: Not on file    Physically abused: Not on file    Forced sexual activity: Not on file  Other Topics Concern  . Not on file  Social History Narrative   Daughter helps with  pt considerably- she lives across the street from her daughter   Homemaker   Completed high school   2 children, both daughters, one died at age 58   Enjoys spending time outside, yard work.   No pets    Outpatient Medications Prior to Visit  Medication Sig Dispense Refill  . glucose blood (TRUETRACK TEST) test strip Use as instructed to check blood sugar once a day.  Dx E11.9 100 each 1  . hydroxypropyl methylcellulose / hypromellose (ISOPTO TEARS / GONIOVISC) 2.5 % ophthalmic solution Place 1 drop into both eyes 3 (three) times daily as needed for dry eyes.    [name] thiamine (VITAMIN B-1) 100 MG tablet Take 100 mg by mouth daily.    . TRUEPLUS LANCETS 33G MISC Use to check blood sugar once a day. Dx E11.9 100 each 1  . acebutolol (SECTRAL) 400 MG capsule Take 1 capsule (400 mg total) by mouth at bedtime. 90 capsule 1  . amLODipine (NORVASC) 5 MG tablet Take 1 tablet (5 mg total) by mouth every morning. 90 tablet 1  . atorvastatin (LIPITOR) 10 MG tablet Take 10 mg by mouth daily.     [name] gabapentin (NEURONTIN) 100 MG capsule Take 2 capsules (200 mg total) by mouth 3 (three) times daily. (Patient taking differently: Take 100-200 mg by mouth 2 (two) times daily. 200 mg in the am and 100 mg at bedtime) 180 capsule 5  . glimepiride (AMARYL) 1 MG tablet Take 1 tablet (1 mg total) by mouth every morning. 30 tablet 11  . levothyroxine (SYNTHROID, LEVOTHROID) 100 MCG tablet Take 100 mcg by mouth daily before breakfast.    . sitaGLIPtin (JANUVIA) 50 MG tablet Take 1 tablet (50 mg total) by mouth every morning. 90 tablet 1  . telmisartan (MICARDIS) 80 MG tablet Take 1 tablet by mouth daily.     No facility-administered medications prior to visit.     No Known Allergies  ROS Review of Systems  Constitutional: Negative for chills, diaphoresis, fatigue, fever and unexpected weight change.  HENT: Negative.   Eyes: Negative for photophobia and visual disturbance.  Respiratory: Negative for chest tightness and wheezing.   Cardiovascular: Negative.   Gastrointestinal: Negative.   Endocrine: Negative for polyphagia and polyuria.  Musculoskeletal: Positive for gait problem. Negative for joint swelling.  Skin: Negative for color change and pallor.  Neurological: Positive for numbness. Negative for seizures and syncope.  Hematological: Does not bruise/bleed easily.  Psychiatric/Behavioral: Negative.       Objective:    Physical Exam  Constitutional: She is oriented to person, place, and time. She appears well-developed and well-nourished. No distress.  HENT:  Head: Normocephalic and atraumatic.  Right Ear: External ear normal.  Left Ear: External ear normal.  Mouth/Throat: No oropharyngeal exudate.  Eyes: Pupils are equal, round, and reactive to light. Right eye exhibits no discharge. Left eye exhibits no discharge. No scleral icterus.  Neck: No JVD present. No tracheal deviation present.  Cardiovascular: Normal rate, regular rhythm and normal heart sounds.  Pulmonary/Chest:  Effort normal and breath sounds normal. No stridor.  Abdominal: Bowel sounds are normal.  Neurological: She is alert and oriented to person, place, and time.  Skin: Skin is warm and dry. She is not diaphoretic.  Psychiatric: She has a normal mood and affect. Her behavior is normal.    BP 136/80   Pulse 68   Ht 5' 7" (1.702 m)   Wt 185 lb (83.9 kg)   SpO2 96%   BMI  28.98 kg/m  Wt Readings from Last 3 Encounters:  12/08/18 185 lb (83.9 kg)  08/02/18 184 lb 15.5 oz (83.9 kg)  07/09/18 183 lb (83 kg)   BP Readings from Last 3 Encounters:  12/08/18 136/80  09/08/18 136/80  08/06/18 138/70   Guideline developer:  UpToDate (see UpToDate for funding source) Date Released: June 2014  Health Maintenance Due  Topic Date Due  . TETANUS/TDAP  08/21/1940  . DEXA SCAN  08/21/1986  . MAMMOGRAM  11/18/2017    There are no preventive care reminders to display for this patient.  Lab Results  Component Value Date   TSH 1.92 07/09/2018   Lab Results  Component Value Date   WBC 8.7 08/02/2018   HGB 11.0 (L) 08/02/2018   HCT 31.9 (L) 08/02/2018   MCV 96.1 08/02/2018   PLT 150 08/02/2018   Lab Results  Component Value Date   NA 135 09/08/2018   K 4.2 09/08/2018   CHLORIDE 102 01/04/2016   CO2 28 09/08/2018   GLUCOSE 247 (H) 09/08/2018   BUN 26 (H) 09/08/2018   CREATININE 1.31 (H) 09/08/2018   BILITOT 0.6 08/06/2018   ALKPHOS 54 08/06/2018   AST 25 08/06/2018   ALT 26 08/06/2018   PROT 6.5 08/06/2018   ALBUMIN 3.8 08/06/2018   CALCIUM 9.6 09/08/2018   ANIONGAP 9 08/02/2018   EGFR 29 (L) 01/04/2016   GFR 39.92 (L) 09/08/2018   Lab Results  Component Value Date   CHOL 166 04/07/2018   Lab Results  Component Value Date   HDL 42.50 04/07/2018   Lab Results  Component Value Date   LDLCALC 93 03/12/2016   Lab Results  Component Value Date   TRIG 368.0 (H) 04/07/2018   Lab Results  Component Value Date   CHOLHDL 4 04/07/2018   Lab Results  Component Value Date    HGBA1C 6.7 (H) 09/08/2018      Assessment & Plan:   Problem List Items Addressed This Visit      Cardiovascular and Mediastinum   Essential hypertension - Primary   Relevant Medications   acebutolol (SECTRAL) 400 MG capsule   amLODipine (NORVASC) 5 MG tablet   atorvastatin (LIPITOR) 10 MG tablet   telmisartan (MICARDIS) 80 MG tablet   Other Relevant Orders   CBC   Comprehensive metabolic panel   Urinalysis, Routine w reflex microscopic     Endocrine   Hypothyroidism   Relevant Medications   acebutolol (SECTRAL) 400 MG capsule   levothyroxine (SYNTHROID, LEVOTHROID) 100 MCG tablet   Other Relevant Orders   TSH   Diabetes mellitus type II, controlled ([place])   Relevant Medications   atorvastatin (LIPITOR) 10 MG tablet   glimepiride (AMARYL) 1 MG tablet   sitaGLIPtin (JANUVIA) 50 MG tablet   telmisartan (MICARDIS) 80 MG tablet   Other Relevant Orders   Comprehensive metabolic panel   Hemoglobin A1c   Urinalysis, Routine w reflex microscopic     Nervous and Auditory   Peripheral neuropathy   Relevant Medications   gabapentin (NEURONTIN) 100 MG capsule     Genitourinary   CKD (chronic kidney disease) stage 3, GFR 30-59 ml/min (HCC)   Relevant Medications   telmisartan (MICARDIS) 80 MG tablet   Other Relevant Orders   Comprehensive metabolic panel     Other   Hyperlipidemia   Relevant Medications   acebutolol (SECTRAL) 400 MG capsule   amLODipine (NORVASC) 5 MG tablet   atorvastatin (LIPITOR) 10 MG tablet   telmisartan (MICARDIS)  80 MG tablet   Other Relevant Orders   LDL cholesterol, direct   Macrocytic anemia   Relevant Orders   B12 and Folate Panel      Meds ordered this encounter  Medications  . acebutolol (SECTRAL) 400 MG capsule    Sig: Take 1 capsule (400 mg total) by mouth at bedtime.    Dispense:  90 capsule    Refill:  1    Supervising MD:  Penni Homans   DEA: [id number]  . amLODipine (NORVASC) 5 MG tablet    Sig: Take 1 tablet (5 mg total)  by mouth every morning.    Dispense:  90 tablet    Refill:  1    Supervising MD:  Penni Homans   DEA: [id number]  . atorvastatin (LIPITOR) 10 MG tablet    Sig: Take 1 tablet (10 mg total) by mouth daily.    Dispense:  90 tablet    Refill:  1  . gabapentin (NEURONTIN) 100 MG capsule    Sig: Take 1-2 capsules (100-200 mg total) by mouth 3 (three) times daily. 200 mg in the am and 100 mg at bedtime    Dispense:  180 capsule    Refill:  5  . levothyroxine (SYNTHROID, LEVOTHROID) 100 MCG tablet    Sig: Take 1 tablet (100 mcg total) by mouth daily before breakfast.    Dispense:  90 tablet    Refill:  1  . glimepiride (AMARYL) 1 MG tablet    Sig: Take 1 tablet (1 mg total) by mouth every morning.    Dispense:  30 tablet    Refill:  11  . sitaGLIPtin (JANUVIA) 50 MG tablet    Sig: Take 1 tablet (50 mg total) by mouth every morning.    Dispense:  90 tablet    Refill:  1  . telmisartan (MICARDIS) 80 MG tablet    Sig: Take 1 tablet (80 mg total) by mouth daily.    Dispense:  90 tablet    Refill:  1    Follow-up: Return in about 3 months (around 03/09/2019).

## 2018-12-10 LAB — B12 AND FOLATE PANEL
Folate: >24 ng/mL (ref >5.9)
Vitamin B-12: 1025 pg/mL — ABNORMAL HIGH (ref 211–911)

## 2018-12-11 ENCOUNTER — Telehealth: Payer: Self-pay | Admitting: Family Medicine

## 2018-12-11 NOTE — Telephone Encounter (Signed)
Copied from [place]. Topic: Quick Communication - Lab Results (Clinic Use ONLY) >> Dec 11, 2018  9:48 AM Self, Rande Brunt, CMA wrote: Called patient to inform them of her lab results. When patient returns call, triage nurse may disclose results. >> Dec 11, 2018 12:06 PM Bea Graff, NT wrote: Pts daughter calling back for lab results.

## 2018-12-11 NOTE — Telephone Encounter (Signed)
Results given and documented in result note. 

## 2018-12-23 ENCOUNTER — Ambulatory Visit (INDEPENDENT_AMBULATORY_CARE_PROVIDER_SITE_OTHER): Payer: Medicare Other | Admitting: Podiatry

## 2018-12-23 ENCOUNTER — Encounter: Payer: Self-pay | Admitting: Podiatry

## 2018-12-23 DIAGNOSIS — M79675 Pain in left toe(s): Secondary | ICD-10-CM

## 2018-12-23 DIAGNOSIS — E0843 Diabetes mellitus due to underlying condition with diabetic autonomic (poly)neuropathy: Secondary | ICD-10-CM

## 2018-12-23 DIAGNOSIS — B351 Tinea unguium: Secondary | ICD-10-CM

## 2018-12-23 DIAGNOSIS — M79674 Pain in right toe(s): Secondary | ICD-10-CM

## 2018-12-23 NOTE — Progress Notes (Signed)
Complaint:  Visit Type: Patient returns to my office for continued preventative foot care services. Complaint: Patient states" my nails have grown long and thick and become painful to walk and wear shoes" Patient has been diagnosed with DM with no foot complications. The patient presents for preventative foot care services. No changes to ROS  Podiatric Exam: Vascular: dorsalis pedis and posterior tibial pulses are palpable bilateral. Capillary return is immediate. Temperature gradient is WNL. Skin turgor WNL  Sensorium: Diminished  Semmes Weinstein monofilament test. Normal tactile sensation bilaterally. Nail Exam: Pt has thick disfigured discolored nails with subungual debris noted bilateral entire nail hallux through fifth toenails Ulcer Exam: There is no evidence of ulcer or pre-ulcerative changes or infection. Orthopedic Exam: Muscle tone and strength are WNL. No limitations in general ROM. No crepitus or effusions noted. Hammer toes  B/l Bony prominences are unremarkable. Skin: No Porokeratosis. No infection or ulcers  Diagnosis:  Onychomycosis, , Pain in right toe, pain in left toes  Treatment & Plan Procedures and Treatment: Consent by patient was obtained for treatment procedures.   Debridement of mycotic and hypertrophic toenails, 1 through 5 bilateral and clearing of subungual debris. No ulceration, no infection noted.  Return Visit-Office Procedure: Patient instructed to return to the office for a follow up visit 4 months for continued evaluation and treatment.    Gardiner Barefoot DPM

## 2018-12-28 ENCOUNTER — Encounter: Payer: Self-pay | Admitting: Family Medicine

## 2018-12-28 DIAGNOSIS — E119 Type 2 diabetes mellitus without complications: Secondary | ICD-10-CM

## 2018-12-29 MED ORDER — GLUCOSE BLOOD VI STRP: ORAL_STRIP | 3 refills | Status: DC

## 2018-12-29 MED ORDER — TRUEPLUS LANCETS 33G MISC: 3 refills | Status: DC

## 2019-03-09 ENCOUNTER — Encounter: Payer: Self-pay | Admitting: Family Medicine

## 2019-03-09 ENCOUNTER — Ambulatory Visit (INDEPENDENT_AMBULATORY_CARE_PROVIDER_SITE_OTHER): Payer: Medicare Other | Admitting: Family Medicine

## 2019-03-09 VITALS — Ht 67.0 in

## 2019-03-09 DIAGNOSIS — E039 Hypothyroidism, unspecified: Secondary | ICD-10-CM

## 2019-03-09 DIAGNOSIS — N183 Chronic kidney disease, stage 3 unspecified: Secondary | ICD-10-CM

## 2019-03-09 DIAGNOSIS — I10 Essential (primary) hypertension: Secondary | ICD-10-CM | POA: Diagnosis not present

## 2019-03-09 DIAGNOSIS — E119 Type 2 diabetes mellitus without complications: Secondary | ICD-10-CM | POA: Diagnosis not present

## 2019-03-09 NOTE — Progress Notes (Signed)
Established Patient Office Visit  Subjective:  Patient ID: [name], female    DOB: Dec 24, 1918  Age: 85 y.o. MRN: [id number]  CC:  Chief Complaint  Patient presents with  . Follow-up    HPI [name] presents for follow-up of her hypertension and diabetes.  Blood pressure is moderately controlled with the systolics running in the 756 to 160 range with the diastolics remaining in the 70 range.  Fasting blood sugars have been in the 1 40-1 70 range.  Last hemoglobin A1c was 7.  Patient is assisted by her daughter [name].  Past Medical History:  Diagnosis Date  . Anxiety    loss of child 1 year ago  . Cancer ([place])    right breast  . Diabetes mellitus, type 2 ([place])   . Glaucoma   . Gout   . Hyperlipidemia   . Hypertension    states eccho Dr Mare Ferrari 1 year ago- no record [facility] or office records  . Hyperthyroidism     Past Surgical History:  Procedure Laterality Date  . ABDOMINAL HYSTERECTOMY  1995  . BREAST LUMPECTOMY  11/06/2011   Procedure: LUMPECTOMY;  Surgeon: Harl Bowie, MD;  Location: [facility];  Service: General;  Laterality: Right;  . CHOLECYSTECTOMY  1976  . ROTATOR CUFF REPAIR  1999   right    Family History  Problem Relation Age of Onset  . Cancer Mother        bladder  . Kidney disease Mother   . Heart attack Brother   . Stroke Father   . Hypertension Sister   . Hyperlipidemia Daughter   . Diabetes Sister     Social History   Socioeconomic History  . Marital status: Widowed    Spouse name: Not on file  . Number of children: Not on file  . Years of education: Not on file  . Highest education level: Not on file  Occupational History  . Not on file  Social Needs  . Financial resource strain: Not on file  . Food insecurity:    Worry: Not on file    Inability: Not on file  . Transportation needs:    Medical: Not on file    Non-medical: Not on file  Tobacco Use  . Smoking status: Never Smoker  . Smokeless tobacco: Never  Used  Substance and Sexual Activity  . Alcohol use: No  . Drug use: No  . Sexual activity: Not on file  Lifestyle  . Physical activity:    Days per week: Not on file    Minutes per session: Not on file  . Stress: Not on file  Relationships  . Social connections:    Talks on phone: Not on file    Gets together: Not on file    Attends religious service: Not on file    Active member of club or organization: Not on file    Attends meetings of clubs or organizations: Not on file    Relationship status: Not on file  . Intimate partner violence:    Fear of current or ex partner: Not on file    Emotionally abused: Not on file    Physically abused: Not on file    Forced sexual activity: Not on file  Other Topics Concern  . Not on file  Social History Narrative   Daughter helps with pt considerably- she lives across the street from her daughter   Homemaker   Completed high school  2 children, both daughters, one died at age 17   Enjoys spending time outside, yard work.   No pets    Outpatient Medications Prior to Visit  Medication Sig Dispense Refill  . acebutolol (SECTRAL) 400 MG capsule Take 1 capsule (400 mg total) by mouth at bedtime. 90 capsule 1  . amLODipine (NORVASC) 5 MG tablet Take 1 tablet (5 mg total) by mouth every morning. 90 tablet 1  . atorvastatin (LIPITOR) 10 MG tablet Take 1 tablet (10 mg total) by mouth daily. 90 tablet 1  . gabapentin (NEURONTIN) 100 MG capsule Take 1-2 capsules (100-200 mg total) by mouth 3 (three) times daily. 200 mg in the am and 100 mg at bedtime 180 capsule 5  . glimepiride (AMARYL) 1 MG tablet Take 1 tablet (1 mg total) by mouth every morning. 30 tablet 11  . glucose blood (TRUETRACK TEST) test strip Use as instructed to check blood sugar once a day.  Dx E11.9 100 each 3  . hydroxypropyl methylcellulose / hypromellose (ISOPTO TEARS / GONIOVISC) 2.5 % ophthalmic solution Place 1 drop into both eyes 3 (three) times daily as needed for dry  eyes.    [name] levothyroxine (SYNTHROID, LEVOTHROID) 100 MCG tablet Take 1 tablet (100 mcg total) by mouth daily before breakfast. 90 tablet 1  . sitaGLIPtin (JANUVIA) 50 MG tablet Take 1 tablet (50 mg total) by mouth every morning. 90 tablet 1  . telmisartan (MICARDIS) 80 MG tablet Take 1 tablet (80 mg total) by mouth daily. 90 tablet 1  . thiamine (VITAMIN B-1) 100 MG tablet Take 100 mg by mouth daily.    . TRUEPLUS LANCETS 33G MISC Use to check blood sugar once a day. Dx E11.9 100 each 3   No facility-administered medications prior to visit.     No Known Allergies  ROS Review of Systems  Constitutional: Negative for diaphoresis, fatigue, fever and unexpected weight change.  HENT: Negative.   Respiratory: Negative.   Cardiovascular: Negative.   Gastrointestinal: Negative.   Endocrine: Negative for polyphagia and polyuria.  Psychiatric/Behavioral: Negative.       Objective:    Physical Exam  Constitutional: She is oriented to person, place, and time. No distress.  Pulmonary/Chest: Effort normal.  Neurological: She is alert and oriented to person, place, and time.  Psychiatric: She has a normal mood and affect. Her behavior is normal.    Ht 5' 7"  (1.702 m)   BMI 28.98 kg/m  Wt Readings from Last 3 Encounters:  12/08/18 185 lb (83.9 kg)  08/02/18 184 lb 15.5 oz (83.9 kg)  07/09/18 183 lb (83 kg)     Health Maintenance Due  Topic Date Due  . TETANUS/TDAP  08/21/1940  . DEXA SCAN  08/21/1986  . MAMMOGRAM  11/18/2017    There are no preventive care reminders to display for this patient.  Lab Results  Component Value Date   TSH 1.90 12/08/2018   Lab Results  Component Value Date   WBC 9.6 12/08/2018   HGB 14.0 12/08/2018   HCT 41.0 12/08/2018   MCV 97.5 12/08/2018   PLT 224.0 12/08/2018   Lab Results  Component Value Date   NA 132 (L) 12/08/2018   K 4.0 12/08/2018   CHLORIDE 102 01/04/2016   CO2 25 12/08/2018   GLUCOSE 100 (H) 12/08/2018   BUN 25 (H)  12/08/2018   CREATININE 1.28 (H) 12/08/2018   BILITOT 0.7 12/08/2018   ALKPHOS 52 12/08/2018   AST 18 12/08/2018   ALT 12  12/08/2018   PROT 7.4 12/08/2018   ALBUMIN 4.2 12/08/2018   CALCIUM 9.6 12/08/2018   ANIONGAP 9 08/02/2018   EGFR 29 (L) 01/04/2016   GFR 38.56 (L) 12/08/2018   Lab Results  Component Value Date   CHOL 166 04/07/2018   Lab Results  Component Value Date   HDL 42.50 04/07/2018   Lab Results  Component Value Date   LDLCALC 93 03/12/2016   Lab Results  Component Value Date   TRIG 368.0 (H) 04/07/2018   Lab Results  Component Value Date   CHOLHDL 4 04/07/2018   Lab Results  Component Value Date   HGBA1C 7.0 (H) 12/08/2018      Assessment & Plan:   Problem List Items Addressed This Visit      Cardiovascular and Mediastinum   Essential hypertension - Primary     Endocrine   Hypothyroidism   Diabetes mellitus type II, controlled ([place])     Genitourinary   CKD (chronic kidney disease) stage 3, GFR 30-59 ml/min (HCC)      No orders of the defined types were placed in this encounter.   Follow-up: Return 2-3 months.    [name], MDVirtual Visit via Telephone Note  I connected with Kathalene Sporer Dimaano on 03/09/19 at  2:30 PM EDT by telephone and verified that I am speaking with the correct person using two identifiers.   I discussed the limitations, risks, security and privacy concerns of performing an evaluation and management service by telephone and the availability of in person appointments. I also discussed with the patient that there may be a patient responsible charge related to this service. The patient expressed understanding and agreed to proceed.   History of Present Illness:    Observations/Objective:   Assessment and Plan:   Follow Up Instructions:    I discussed the assessment and treatment plan with the patient. The patient was provided an opportunity to ask questions and all were answered. The patient agreed  with the plan and demonstrated an understanding of the instructions.   The patient was advised to call back or seek an in-person evaluation if the symptoms worsen or if the condition fails to improve as anticipated.  I provided 15 minutes of non-face-to-face time during this encounter.   Patient has been relatively stable and her blood work has been stable as well.  Considering the current health crisis she will follow-up in 2 to 3 months.

## 2019-05-10 ENCOUNTER — Other Ambulatory Visit: Payer: Self-pay

## 2019-05-10 DIAGNOSIS — I10 Essential (primary) hypertension: Secondary | ICD-10-CM

## 2019-05-10 DIAGNOSIS — E039 Hypothyroidism, unspecified: Secondary | ICD-10-CM

## 2019-05-10 DIAGNOSIS — N183 Chronic kidney disease, stage 3 unspecified: Secondary | ICD-10-CM

## 2019-05-10 DIAGNOSIS — E119 Type 2 diabetes mellitus without complications: Secondary | ICD-10-CM

## 2019-05-10 MED ORDER — TELMISARTAN 80 MG PO TABS: 80.0000 mg | ORAL_TABLET | Freq: Every day | ORAL | 1 refills | Status: DC

## 2019-05-10 MED ORDER — LEVOTHYROXINE SODIUM 100 MCG PO TABS: 100.0000 ug | ORAL_TABLET | Freq: Every day | ORAL | 1 refills | Status: DC

## 2019-05-18 ENCOUNTER — Encounter: Payer: Self-pay | Admitting: Podiatry

## 2019-05-18 ENCOUNTER — Ambulatory Visit (INDEPENDENT_AMBULATORY_CARE_PROVIDER_SITE_OTHER): Payer: Medicare Other | Admitting: Podiatry

## 2019-05-18 ENCOUNTER — Other Ambulatory Visit: Payer: Self-pay

## 2019-05-18 DIAGNOSIS — M79674 Pain in right toe(s): Secondary | ICD-10-CM

## 2019-05-18 DIAGNOSIS — B351 Tinea unguium: Secondary | ICD-10-CM

## 2019-05-18 DIAGNOSIS — M79675 Pain in left toe(s): Secondary | ICD-10-CM

## 2019-05-18 DIAGNOSIS — E1142 Type 2 diabetes mellitus with diabetic polyneuropathy: Secondary | ICD-10-CM

## 2019-05-18 DIAGNOSIS — E1140 Type 2 diabetes mellitus with diabetic neuropathy, unspecified: Secondary | ICD-10-CM | POA: Insufficient documentation

## 2019-05-18 NOTE — Progress Notes (Signed)
Complaint:  Visit Type: Patient returns to my office for continued preventative foot care services. Complaint: Patient states" my nails have grown long and thick and become painful to walk and wear shoes" Patient has been diagnosed with DM with no foot complications. The patient presents for preventative foot care services. No changes to ROS  Podiatric Exam: Vascular: dorsalis pedis and posterior tibial pulses are palpable bilateral. Capillary return is immediate. Temperature gradient is WNL. Skin turgor WNL  Sensorium: Diminished  Semmes Weinstein monofilament test. Normal tactile sensation bilaterally. Nail Exam: Pt has thick disfigured discolored nails with subungual debris noted bilateral entire nail hallux through fifth toenails Ulcer Exam: There is no evidence of ulcer or pre-ulcerative changes or infection. Orthopedic Exam: Muscle tone and strength are WNL. No limitations in general ROM. No crepitus or effusions noted. Hammer toes  B/l Bony prominences are unremarkable. Skin: No Porokeratosis. No infection or ulcers  Diagnosis:  Onychomycosis, , Pain in right toe, pain in left toes  Treatment & Plan Procedures and Treatment: Consent by patient was obtained for treatment procedures.   Debridement of mycotic and hypertrophic toenails, 1 through 5 bilateral and clearing of subungual debris. No ulceration, no infection noted.  Return Visit-Office Procedure: Patient instructed to return to the office for a follow up visit 4 months for continued evaluation and treatment.    Gardiner Barefoot DPM

## 2019-05-27 ENCOUNTER — Telehealth: Payer: Self-pay

## 2019-05-27 NOTE — Telephone Encounter (Signed)

## 2019-05-28 ENCOUNTER — Ambulatory Visit (INDEPENDENT_AMBULATORY_CARE_PROVIDER_SITE_OTHER): Payer: Medicare Other | Admitting: Family Medicine

## 2019-05-28 ENCOUNTER — Encounter: Payer: Self-pay | Admitting: Family Medicine

## 2019-05-28 VITALS — BP 138/80 | HR 68 | Ht 67.0 in | Wt 178.0 lb

## 2019-05-28 DIAGNOSIS — E119 Type 2 diabetes mellitus without complications: Secondary | ICD-10-CM

## 2019-05-28 DIAGNOSIS — I10 Essential (primary) hypertension: Secondary | ICD-10-CM

## 2019-05-28 MED ORDER — ACEBUTOLOL HCL 400 MG PO CAPS: 400.0000 mg | ORAL_CAPSULE | Freq: Every day | ORAL | 1 refills | Status: DC

## 2019-05-28 NOTE — Progress Notes (Signed)
Established Patient Office Visit  Subjective:  Patient ID: [name], female    DOB: 01-22-1919  Age: 85 y.o. MRN: [id number]  CC:  Chief Complaint  Patient presents with  . high blood sugars    HPI [name] presents for follow-up of her fasting blood sugars that have been running a little bit higher.  They have been running from the 150- 170 range.  Patient has not changed her diet appreciably.  Daughter [name] has been concerned about whether or not patient has been taking her evening doses of medications appropriately.  Today patient has refused the use of a pill organizer.  There is been some droopiness of the left upper eyelid.  Patient has experienced some lightheadedness without vertigo.  She has experienced one episode of diplopia she says.  She denies headache, dysphasia weakness on one side of her body and not the other.  Continues to use her walker without issue.  Blood pressures have been controlled.  Past Medical History:  Diagnosis Date  . Anxiety    loss of child 1 year ago  . Cancer ([place])    right breast  . Diabetes mellitus, type 2 ([place])   . Glaucoma   . Gout   . Hyperlipidemia   . Hypertension    states eccho Dr Mare Ferrari 1 year ago- no record found in [facility] or office records  . Hyperthyroidism     Past Surgical History:  Procedure Laterality Date  . ABDOMINAL HYSTERECTOMY  1995  . BREAST LUMPECTOMY  11/06/2011   Procedure: LUMPECTOMY;  Surgeon: Harl Bowie, MD;  Location: [facility];  Service: General;  Laterality: Right;  . CHOLECYSTECTOMY  1976  . ROTATOR CUFF REPAIR  1999   right    Family History  Problem Relation Age of Onset  . Cancer Mother        bladder  . Kidney disease Mother   . Heart attack Brother   . Stroke Father   . Hypertension Sister   . Hyperlipidemia Daughter   . Diabetes Sister     Social History   Socioeconomic History  . Marital status: Widowed    Spouse name: Not on file  . Number of children: Not on file   . Years of education: Not on file  . Highest education level: Not on file  Occupational History  . Not on file  Social Needs  . Financial resource strain: Not on file  . Food insecurity    Worry: Not on file    Inability: Not on file  . Transportation needs    Medical: Not on file    Non-medical: Not on file  Tobacco Use  . Smoking status: Never Smoker  . Smokeless tobacco: Never Used  Substance and Sexual Activity  . Alcohol use: No  . Drug use: No  . Sexual activity: Not on file  Lifestyle  . Physical activity    Days per week: Not on file    Minutes per session: Not on file  . Stress: Not on file  Relationships  . Social Herbalist on phone: Not on file    Gets together: Not on file    Attends religious service: Not on file    Active member of club or organization: Not on file    Attends meetings of clubs or organizations: Not on file    Relationship status: Not on file  . Intimate partner violence    Fear of current or  ex partner: Not on file    Emotionally abused: Not on file    Physically abused: Not on file    Forced sexual activity: Not on file  Other Topics Concern  . Not on file  Social History Narrative   Daughter helps with pt considerably- she lives across the street from her daughter   Homemaker   Completed high school   2 children, both daughters, one died at age 61   Enjoys spending time outside, yard work.   No pets    Outpatient Medications Prior to Visit  Medication Sig Dispense Refill  . amLODipine (NORVASC) 5 MG tablet Take 1 tablet (5 mg total) by mouth every morning. 90 tablet 1  . atorvastatin (LIPITOR) 10 MG tablet Take 1 tablet (10 mg total) by mouth daily. 90 tablet 1  . gabapentin (NEURONTIN) 100 MG capsule Take 1-2 capsules (100-200 mg total) by mouth 3 (three) times daily. 200 mg in the am and 100 mg at bedtime 180 capsule 5  . glimepiride (AMARYL) 1 MG tablet Take 1 tablet (1 mg total) by mouth every morning. 30 tablet 11   . glucose blood (TRUETRACK TEST) test strip Use as instructed to check blood sugar once a day.  Dx E11.9 100 each 3  . hydroxypropyl methylcellulose / hypromellose (ISOPTO TEARS / GONIOVISC) 2.5 % ophthalmic solution Place 1 drop into both eyes 3 (three) times daily as needed for dry eyes.    [name] levothyroxine (SYNTHROID) 100 MCG tablet Take 1 tablet (100 mcg total) by mouth daily before breakfast. 90 tablet 1  . sitaGLIPtin (JANUVIA) 50 MG tablet Take 1 tablet (50 mg total) by mouth every morning. 90 tablet 1  . telmisartan (MICARDIS) 80 MG tablet Take 1 tablet (80 mg total) by mouth daily. 90 tablet 1  . thiamine (VITAMIN B-1) 100 MG tablet Take 100 mg by mouth daily.    . TRUEPLUS LANCETS 33G MISC Use to check blood sugar once a day. Dx E11.9 100 each 3  . acebutolol (SECTRAL) 400 MG capsule Take 1 capsule (400 mg total) by mouth at bedtime. 90 capsule 1   No facility-administered medications prior to visit.     No Known Allergies  ROS Review of Systems  Constitutional: Negative for chills, diaphoresis, fatigue, fever and unexpected weight change.  HENT: Negative.   Eyes: Negative for photophobia and visual disturbance.  Respiratory: Negative.   Cardiovascular: Negative.   Endocrine: Negative for polyphagia and polyuria.  Neurological: Positive for light-headedness. Negative for weakness, numbness and headaches.  Hematological: Negative.   Psychiatric/Behavioral: Negative.       Objective:    Physical Exam  Constitutional: She is oriented to person, place, and time. She appears well-developed and well-nourished. No distress.  HENT:  Head: Normocephalic and atraumatic.  Right Ear: External ear normal.  Left Ear: External ear normal.  Mouth/Throat: Oropharynx is clear and moist. No oropharyngeal exudate.  Eyes: Pupils are equal, round, and reactive to light. Conjunctivae and EOM are normal. Right eye exhibits no discharge. Left eye exhibits no discharge. No scleral icterus.   Neck: Neck supple. No JVD present. No tracheal deviation present. No thyromegaly present.  Cardiovascular: Normal rate, regular rhythm and normal heart sounds.  Pulmonary/Chest: Effort normal and breath sounds normal. No stridor.  Lymphadenopathy:    She has no cervical adenopathy.  Neurological: She is alert and oriented to person, place, and time.  Left upper lid droops.   Skin: Skin is warm and dry. She is not  diaphoretic.  Psychiatric: She has a normal mood and affect. Her behavior is normal.    BP 138/80   Pulse 68   Ht 5' 7"  (1.702 m)   Wt 178 lb (80.7 kg)   SpO2 92%   BMI 27.88 kg/m  Wt Readings from Last 3 Encounters:  05/28/19 178 lb (80.7 kg)  12/08/18 185 lb (83.9 kg)  08/02/18 184 lb 15.5 oz (83.9 kg)   BP Readings from Last 3 Encounters:  05/28/19 138/80  12/08/18 136/80  09/08/18 136/80   Guideline developer:  UpToDate (see UpToDate for funding source) Date Released: June 2014  Health Maintenance Due  Topic Date Due  . TETANUS/TDAP  08/21/1940  . DEXA SCAN  08/21/1986  . MAMMOGRAM  11/18/2017  . FOOT EXAM  04/08/2019  . OPHTHALMOLOGY EXAM  05/19/2019    There are no preventive care reminders to display for this patient.  Lab Results  Component Value Date   TSH 1.90 12/08/2018   Lab Results  Component Value Date   WBC 9.6 12/08/2018   HGB 14.0 12/08/2018   HCT 41.0 12/08/2018   MCV 97.5 12/08/2018   PLT 224.0 12/08/2018   Lab Results  Component Value Date   NA 132 (L) 12/08/2018   K 4.0 12/08/2018   CHLORIDE 102 01/04/2016   CO2 25 12/08/2018   GLUCOSE 100 (H) 12/08/2018   BUN 25 (H) 12/08/2018   CREATININE 1.28 (H) 12/08/2018   BILITOT 0.7 12/08/2018   ALKPHOS 52 12/08/2018   AST 18 12/08/2018   ALT 12 12/08/2018   PROT 7.4 12/08/2018   ALBUMIN 4.2 12/08/2018   CALCIUM 9.6 12/08/2018   ANIONGAP 9 08/02/2018   EGFR 29 (L) 01/04/2016   GFR 38.56 (L) 12/08/2018   Lab Results  Component Value Date   CHOL 166 04/07/2018   Lab  Results  Component Value Date   HDL 42.50 04/07/2018   Lab Results  Component Value Date   LDLCALC 93 03/12/2016   Lab Results  Component Value Date   TRIG 368.0 (H) 04/07/2018   Lab Results  Component Value Date   CHOLHDL 4 04/07/2018   Lab Results  Component Value Date   HGBA1C 7.0 (H) 12/08/2018      Assessment & Plan:   Problem List Items Addressed This Visit      Cardiovascular and Mediastinum   Essential hypertension - Primary   Relevant Medications   acebutolol (SECTRAL) 400 MG capsule     Endocrine   Diabetes mellitus type II, controlled ([place])      Meds ordered this encounter  Medications  . acebutolol (SECTRAL) 400 MG capsule    Sig: Take 1 capsule (400 mg total) by mouth at bedtime.    Dispense:  90 capsule    Refill:  1    Supervising MD:  Penni Homans   DEA: [id number]    Follow-up: Return in about 11 days (around 06/08/2019).   Patient will use a pill organizer.  [name] will a sister with her medications over the next week and a half.  Follow-up on the 21st.  We will allow higher blood sugars with his patient.

## 2019-06-07 ENCOUNTER — Telehealth: Payer: Self-pay

## 2019-06-07 NOTE — Telephone Encounter (Signed)

## 2019-06-08 ENCOUNTER — Encounter: Payer: Self-pay | Admitting: Family Medicine

## 2019-06-08 ENCOUNTER — Ambulatory Visit (INDEPENDENT_AMBULATORY_CARE_PROVIDER_SITE_OTHER): Payer: Medicare Other | Admitting: Family Medicine

## 2019-06-08 VITALS — BP 130/76 | HR 59 | Ht 67.0 in

## 2019-06-08 DIAGNOSIS — R42 Dizziness and giddiness: Secondary | ICD-10-CM | POA: Diagnosis not present

## 2019-06-08 DIAGNOSIS — E519 Thiamine deficiency, unspecified: Secondary | ICD-10-CM

## 2019-06-08 DIAGNOSIS — E785 Hyperlipidemia, unspecified: Secondary | ICD-10-CM

## 2019-06-08 DIAGNOSIS — D539 Nutritional anemia, unspecified: Secondary | ICD-10-CM

## 2019-06-08 DIAGNOSIS — H0015 Chalazion left lower eyelid: Secondary | ICD-10-CM

## 2019-06-08 DIAGNOSIS — I10 Essential (primary) hypertension: Secondary | ICD-10-CM

## 2019-06-08 DIAGNOSIS — N183 Chronic kidney disease, stage 3 unspecified: Secondary | ICD-10-CM

## 2019-06-08 DIAGNOSIS — I129 Hypertensive chronic kidney disease with stage 1 through stage 4 chronic kidney disease, or unspecified chronic kidney disease: Secondary | ICD-10-CM

## 2019-06-08 DIAGNOSIS — E1122 Type 2 diabetes mellitus with diabetic chronic kidney disease: Secondary | ICD-10-CM

## 2019-06-08 DIAGNOSIS — E039 Hypothyroidism, unspecified: Secondary | ICD-10-CM

## 2019-06-08 DIAGNOSIS — E119 Type 2 diabetes mellitus without complications: Secondary | ICD-10-CM

## 2019-06-08 LAB — COMPREHENSIVE METABOLIC PANEL
ALT: 12 U/L (ref 0–35)
AST: 15 U/L (ref 0–37)
Albumin: 3.9 g/dL (ref 3.5–5.2)
Alkaline Phosphatase: 52 U/L (ref 39–117)
BUN: 21 mg/dL (ref 6–23)
CO2: 24 mEq/L (ref 19–32)
Calcium: 9.4 mg/dL (ref 8.4–10.5)
Chloride: 99 mEq/L (ref 96–112)
Creatinine, Ser: 1.26 mg/dL — ABNORMAL HIGH (ref 0.40–1.20)
GFR: 39.23 mL/min — ABNORMAL LOW (ref >60.00)
Glucose, Bld: 154 mg/dL — ABNORMAL HIGH (ref 70–99)
Potassium: 4.2 mEq/L (ref 3.5–5.1)
Sodium: 135 mEq/L (ref 135–145)
Total Bilirubin: 0.5 mg/dL (ref 0.2–1.2)
Total Protein: 6.7 g/dL (ref 6.0–8.3)

## 2019-06-08 LAB — HEMOGLOBIN A1C: Hgb A1c MFr Bld: 6.8 % — ABNORMAL HIGH (ref 4.6–6.5)

## 2019-06-08 LAB — CBC
HCT: 40 % (ref 36.0–46.0)
Hemoglobin: 13.2 g/dL (ref 12.0–15.0)
MCHC: 32.9 g/dL (ref 30.0–36.0)
MCV: 98.6 fl (ref 78.0–100.0)
Platelets: 260 10*3/uL (ref 150.0–400.0)
RBC: 4.06 Mil/uL (ref 3.87–5.11)
RDW: 12.6 % (ref 11.5–15.5)
WBC: 8.1 10*3/uL (ref 4.0–10.5)

## 2019-06-08 LAB — LIPID PANEL
Cholesterol: 172 mg/dL (ref 0–200)
HDL: 42.3 mg/dL (ref >39.00)
NonHDL: 129.54
Total CHOL/HDL Ratio: 4
Triglycerides: 287 mg/dL — ABNORMAL HIGH (ref 0.0–149.0)
VLDL: 57.4 mg/dL — ABNORMAL HIGH (ref 0.0–40.0)

## 2019-06-08 LAB — LDL CHOLESTEROL, DIRECT: Direct LDL: 86 mg/dL

## 2019-06-08 LAB — TSH: TSH: 3.57 u[IU]/mL (ref 0.35–4.50)

## 2019-06-08 MED ORDER — ACEBUTOLOL HCL 200 MG PO CAPS: 200.0000 mg | ORAL_CAPSULE | Freq: Every day | ORAL | 2 refills | Status: DC

## 2019-06-08 MED ORDER — DOXYCYCLINE HYCLATE 100 MG PO TABS: 100.0000 mg | ORAL_TABLET | Freq: Two times a day (BID) | ORAL | 0 refills | Status: DC

## 2019-06-08 MED ORDER — AMLODIPINE BESYLATE 5 MG PO TABS: 5.0000 mg | ORAL_TABLET | ORAL | 1 refills | Status: DC

## 2019-06-08 MED ORDER — ATORVASTATIN CALCIUM 10 MG PO TABS: 10.0000 mg | ORAL_TABLET | Freq: Every day | ORAL | 1 refills | Status: DC

## 2019-06-08 MED ORDER — SITAGLIPTIN PHOSPHATE 50 MG PO TABS: 50.0000 mg | ORAL_TABLET | ORAL | 1 refills | Status: DC

## 2019-06-08 MED ORDER — LEVOTHYROXINE SODIUM 100 MCG PO TABS: 100.0000 ug | ORAL_TABLET | Freq: Every day | ORAL | 1 refills | Status: DC

## 2019-06-08 NOTE — Progress Notes (Signed)
Established Patient Office Visit  Subjective:  Patient ID: [name], female    DOB: Apr 10, 1919  Age: 85 y.o. MRN: [id number]  CC: No chief complaint on file.   HPI [name] presents for follow-up of her diabetes, hypertension, neuropathy, hypothyroidism and lightheadedness.  Fasting sugars have been in the mostly less than 170 range.  Having no issues taking her current medicines.  Blood pressure today here in the clinic is been 130/76 with a pulse rate of 59.  Pressures taken at home have been higher or in the 150/80s range.  Has been experiencing lightheadedness when standing up.  There is been no sensation of spinning.  Patient has an eye appointment on the 31st she is taking Neurontin 100 mg twice daily neuropathy. Has noted some matting in am of OS.  Daughter [name] assures me the patient has been hydrating with 2 quarts lemonade daily.  Past Medical History:  Diagnosis Date  . Anxiety    loss of child 1 year ago  . Cancer ([place])    right breast  . Diabetes mellitus, type 2 ([place])   . Glaucoma   . Gout   . Hyperlipidemia   . Hypertension    states eccho Dr Mare Ferrari 1 year ago- no record found in [facility] or office records  . Hyperthyroidism     Past Surgical History:  Procedure Laterality Date  . ABDOMINAL HYSTERECTOMY  1995  . BREAST LUMPECTOMY  11/06/2011   Procedure: LUMPECTOMY;  Surgeon: Harl Bowie, MD;  Location: [facility];  Service: General;  Laterality: Right;  . CHOLECYSTECTOMY  1976  . ROTATOR CUFF REPAIR  1999   right    Family History  Problem Relation Age of Onset  . Cancer Mother        bladder  . Kidney disease Mother   . Heart attack Brother   . Stroke Father   . Hypertension Sister   . Hyperlipidemia Daughter   . Diabetes Sister     Social History   Socioeconomic History  . Marital status: Widowed    Spouse name: Not on file  . Number of children: Not on file  . Years of education: Not on file  . Highest education level: Not on file   Occupational History  . Not on file  Social Needs  . Financial resource strain: Not on file  . Food insecurity    Worry: Not on file    Inability: Not on file  . Transportation needs    Medical: Not on file    Non-medical: Not on file  Tobacco Use  . Smoking status: Never Smoker  . Smokeless tobacco: Never Used  Substance and Sexual Activity  . Alcohol use: No  . Drug use: No  . Sexual activity: Not on file  Lifestyle  . Physical activity    Days per week: Not on file    Minutes per session: Not on file  . Stress: Not on file  Relationships  . Social Herbalist on phone: Not on file    Gets together: Not on file    Attends religious service: Not on file    Active member of club or organization: Not on file    Attends meetings of clubs or organizations: Not on file    Relationship status: Not on file  . Intimate partner violence    Fear of current or ex partner: Not on file    Emotionally abused: Not on file  Physically abused: Not on file    Forced sexual activity: Not on file  Other Topics Concern  . Not on file  Social History Narrative   Daughter helps with pt considerably- she lives across the street from her daughter   Homemaker   Completed high school   2 children, both daughters, one died at age 62   Enjoys spending time outside, yard work.   No pets    Outpatient Medications Prior to Visit  Medication Sig Dispense Refill  . gabapentin (NEURONTIN) 100 MG capsule Take 1-2 capsules (100-200 mg total) by mouth 3 (three) times daily. 200 mg in the am and 100 mg at bedtime 180 capsule 5  . glimepiride (AMARYL) 1 MG tablet Take 1 tablet (1 mg total) by mouth every morning. 30 tablet 11  . glucose blood (TRUETRACK TEST) test strip Use as instructed to check blood sugar once a day.  Dx E11.9 100 each 3  . hydroxypropyl methylcellulose / hypromellose (ISOPTO TEARS / GONIOVISC) 2.5 % ophthalmic solution Place 1 drop into both eyes 3 (three) times daily as  needed for dry eyes.    . telmisartan (MICARDIS) 80 MG tablet Take 1 tablet (80 mg total) by mouth daily. 90 tablet 1  . TRUEPLUS LANCETS 33G MISC Use to check blood sugar once a day. Dx E11.9 100 each 3  . acebutolol (SECTRAL) 400 MG capsule Take 1 capsule (400 mg total) by mouth at bedtime. 90 capsule 1  . amLODipine (NORVASC) 5 MG tablet Take 1 tablet (5 mg total) by mouth every morning. 90 tablet 1  . atorvastatin (LIPITOR) 10 MG tablet Take 1 tablet (10 mg total) by mouth daily. 90 tablet 1  . levothyroxine (SYNTHROID) 100 MCG tablet Take 1 tablet (100 mcg total) by mouth daily before breakfast. 90 tablet 1  . sitaGLIPtin (JANUVIA) 50 MG tablet Take 1 tablet (50 mg total) by mouth every morning. 90 tablet 1  . thiamine (VITAMIN B-1) 100 MG tablet Take 100 mg by mouth daily.     No facility-administered medications prior to visit.     No Known Allergies  ROS Review of Systems  Constitutional: Negative for diaphoresis, fatigue, fever and unexpected weight change.  HENT: Negative.   Eyes: Positive for discharge. Negative for photophobia, redness and visual disturbance.  Respiratory: Negative for chest tightness and shortness of breath.   Cardiovascular: Negative.   Gastrointestinal: Negative.   Endocrine: Negative for polyphagia and polyuria.  Genitourinary: Negative.   Musculoskeletal: Positive for gait problem. Negative for joint swelling.  Skin: Negative for pallor and rash.  Allergic/Immunologic: Negative for immunocompromised state.  Neurological: Positive for light-headedness. Negative for headaches.  Hematological: Negative.   Psychiatric/Behavioral: Negative.       Objective:    Physical Exam  Constitutional: She is oriented to person, place, and time. She appears well-developed and well-nourished. No distress.  HENT:  Head: Normocephalic and atraumatic.  Right Ear: External ear normal.  Left Ear: External ear normal.  Eyes: Conjunctivae are normal. Right eye  exhibits no discharge. Left eye exhibits no discharge. No scleral icterus.    Neck: No JVD present. No tracheal deviation present.  Cardiovascular: Normal rate, regular rhythm and normal heart sounds.  Pulmonary/Chest: Effort normal and breath sounds normal.  Abdominal: Bowel sounds are normal.  Musculoskeletal:        General: No edema.  Neurological: She is alert and oriented to person, place, and time.  Skin: Skin is dry. She is not diaphoretic.  Psychiatric:   She has a normal mood and affect. Her behavior is normal.    BP 130/76   Pulse (!) 59   Ht 5' 7" (1.702 m)   SpO2 96%   BMI 27.88 kg/m  Wt Readings from Last 3 Encounters:  05/28/19 178 lb (80.7 kg)  12/08/18 185 lb (83.9 kg)  08/02/18 184 lb 15.5 oz (83.9 kg)   BP Readings from Last 3 Encounters:  06/08/19 130/76  05/28/19 138/80  12/08/18 136/80   Guideline developer:  UpToDate (see UpToDate for funding source) Date Released: June 2014  Health Maintenance Due  Topic Date Due  . TETANUS/TDAP  08/21/1940  . DEXA SCAN  08/21/1986  . MAMMOGRAM  11/18/2017  . FOOT EXAM  04/08/2019  . OPHTHALMOLOGY EXAM  05/19/2019  . HEMOGLOBIN A1C  06/08/2019    There are no preventive care reminders to display for this patient.  Lab Results  Component Value Date   TSH 1.90 12/08/2018   Lab Results  Component Value Date   WBC 9.6 12/08/2018   HGB 14.0 12/08/2018   HCT 41.0 12/08/2018   MCV 97.5 12/08/2018   PLT 224.0 12/08/2018   Lab Results  Component Value Date   NA 132 (L) 12/08/2018   K 4.0 12/08/2018   CHLORIDE 102 01/04/2016   CO2 25 12/08/2018   GLUCOSE 100 (H) 12/08/2018   BUN 25 (H) 12/08/2018   CREATININE 1.28 (H) 12/08/2018   BILITOT 0.7 12/08/2018   ALKPHOS 52 12/08/2018   AST 18 12/08/2018   ALT 12 12/08/2018   PROT 7.4 12/08/2018   ALBUMIN 4.2 12/08/2018   CALCIUM 9.6 12/08/2018   ANIONGAP 9 08/02/2018   EGFR 29 (L) 01/04/2016   GFR 38.56 (L) 12/08/2018   Lab Results  Component Value  Date   CHOL 166 04/07/2018   Lab Results  Component Value Date   HDL 42.50 04/07/2018   Lab Results  Component Value Date   LDLCALC 93 03/12/2016   Lab Results  Component Value Date   TRIG 368.0 (H) 04/07/2018   Lab Results  Component Value Date   CHOLHDL 4 04/07/2018   Lab Results  Component Value Date   HGBA1C 7.0 (H) 12/08/2018      Assessment & Plan:   Problem List Items Addressed This Visit      Cardiovascular and Mediastinum   Essential hypertension - Primary   Relevant Medications   acebutolol (SECTRAL) 200 MG capsule   amLODipine (NORVASC) 5 MG tablet   atorvastatin (LIPITOR) 10 MG tablet   Other Relevant Orders   CBC   Comprehensive metabolic panel   Urinalysis, Routine w reflex microscopic     Endocrine   Hypothyroidism   Relevant Medications   acebutolol (SECTRAL) 200 MG capsule   levothyroxine (SYNTHROID) 100 MCG tablet   Other Relevant Orders   TSH   Diabetes mellitus type II, controlled ([facility])   Relevant Medications   atorvastatin (LIPITOR) 10 MG tablet   sitaGLIPtin (JANUVIA) 50 MG tablet   Other Relevant Orders   Comprehensive metabolic panel   Hemoglobin A1c   Urinalysis, Routine w reflex microscopic     Genitourinary   CKD (chronic kidney disease) stage 3, GFR 30-59 ml/min ([facility])     Other   Hyperlipidemia   Relevant Medications   acebutolol (SECTRAL) 200 MG capsule   amLODipine (NORVASC) 5 MG tablet   atorvastatin (LIPITOR) 10 MG tablet   Other Relevant Orders   Lipid panel   Macrocytic anemia   Relevant Orders     CBC    Other Visit Diagnoses    Chalazion left lower eyelid       Relevant Medications   doxycycline (VIBRA-TABS) 100 MG tablet   Light headed       Thiamine deficiency       Relevant Orders   Vitamin B1      Meds ordered this encounter  Medications  . acebutolol (SECTRAL) 200 MG capsule    Sig: Take 1 capsule (200 mg total) by mouth at bedtime.    Dispense:  90 capsule    Refill:  2  . amLODipine  (NORVASC) 5 MG tablet    Sig: Take 1 tablet (5 mg total) by mouth every morning.    Dispense:  90 tablet    Refill:  1    Supervising MD:  Penni Homans   DEA: [id number]  . atorvastatin (LIPITOR) 10 MG tablet    Sig: Take 1 tablet (10 mg total) by mouth daily.    Dispense:  90 tablet    Refill:  1  . sitaGLIPtin (JANUVIA) 50 MG tablet    Sig: Take 1 tablet (50 mg total) by mouth every morning.    Dispense:  90 tablet    Refill:  1  . levothyroxine (SYNTHROID) 100 MCG tablet    Sig: Take 1 tablet (100 mcg total) by mouth daily before breakfast.    Dispense:  90 tablet    Refill:  1  . doxycycline (VIBRA-TABS) 100 MG tablet    Sig: Take 1 tablet (100 mg total) by mouth 2 (two) times daily.    Dispense:  20 tablet    Refill:  0    Follow-up: Return in about 6 weeks (around 07/20/2019).   Believe that her blood pressure may be overtreated at this time leading to her lightheadedness.  [name] [name] will purchase a new blood pressure cuff.  Have decreased the Sectral to 200 mg at night from 400 mg at night.  Believe this is also indicated due to her decreased renal function.

## 2019-06-08 NOTE — Addendum Note (Signed)
Addended by: Lynnea Ferrier on: 06/08/2019 11:29 AM   Modules accepted: Orders

## 2019-06-09 ENCOUNTER — Other Ambulatory Visit (INDEPENDENT_AMBULATORY_CARE_PROVIDER_SITE_OTHER): Payer: Medicare Other

## 2019-06-09 DIAGNOSIS — E119 Type 2 diabetes mellitus without complications: Secondary | ICD-10-CM | POA: Diagnosis not present

## 2019-06-09 LAB — URINALYSIS, ROUTINE W REFLEX MICROSCOPIC
Bilirubin Urine: NEGATIVE
Hgb urine dipstick: NEGATIVE
Ketones, ur: NEGATIVE
Leukocytes,Ua: NEGATIVE
Nitrite: NEGATIVE
RBC / HPF: NONE SEEN (ref 0–?)
Specific Gravity, Urine: 1.015 (ref 1.000–1.030)
Total Protein, Urine: 100 — AB
Urine Glucose: NEGATIVE
Urobilinogen, UA: 0.2 (ref 0.0–1.0)
pH: 7.5 (ref 5.0–8.0)

## 2019-06-12 LAB — VITAMIN B1: Vitamin B1 (Thiamine): 23 nmol/L (ref 8–30)

## 2019-06-18 DIAGNOSIS — H353122 Nonexudative age-related macular degeneration, left eye, intermediate dry stage: Secondary | ICD-10-CM | POA: Diagnosis not present

## 2019-06-18 DIAGNOSIS — H353111 Nonexudative age-related macular degeneration, right eye, early dry stage: Secondary | ICD-10-CM | POA: Diagnosis not present

## 2019-06-18 DIAGNOSIS — E119 Type 2 diabetes mellitus without complications: Secondary | ICD-10-CM | POA: Diagnosis not present

## 2019-06-24 ENCOUNTER — Observation Stay (HOSPITAL_COMMUNITY): Payer: Medicare Other

## 2019-06-24 ENCOUNTER — Encounter (HOSPITAL_COMMUNITY): Payer: Self-pay | Admitting: Emergency Medicine

## 2019-06-24 ENCOUNTER — Inpatient Hospital Stay (HOSPITAL_COMMUNITY)
Admission: EM | Admit: 2019-06-24 | Discharge: 2019-06-26 | DRG: 312 | Disposition: A | Discharge status: Home Health | Payer: Medicare Other | Attending: Family Medicine | Admitting: Family Medicine

## 2019-06-24 ENCOUNTER — Other Ambulatory Visit: Payer: Self-pay

## 2019-06-24 ENCOUNTER — Emergency Department (HOSPITAL_COMMUNITY): Payer: Medicare Other

## 2019-06-24 DIAGNOSIS — E119 Type 2 diabetes mellitus without complications: Secondary | ICD-10-CM | POA: Diagnosis not present

## 2019-06-24 DIAGNOSIS — Z809 Family history of malignant neoplasm, unspecified: Secondary | ICD-10-CM

## 2019-06-24 DIAGNOSIS — I10 Essential (primary) hypertension: Secondary | ICD-10-CM | POA: Diagnosis not present

## 2019-06-24 DIAGNOSIS — Z79890 Hormone replacement therapy (postmenopausal): Secondary | ICD-10-CM

## 2019-06-24 DIAGNOSIS — Z794 Long term (current) use of insulin: Secondary | ICD-10-CM

## 2019-06-24 DIAGNOSIS — E876 Hypokalemia: Secondary | ICD-10-CM | POA: Diagnosis not present

## 2019-06-24 DIAGNOSIS — Z79899 Other long term (current) drug therapy: Secondary | ICD-10-CM

## 2019-06-24 DIAGNOSIS — H9190 Unspecified hearing loss, unspecified ear: Secondary | ICD-10-CM | POA: Diagnosis present

## 2019-06-24 DIAGNOSIS — M109 Gout, unspecified: Secondary | ICD-10-CM | POA: Diagnosis present

## 2019-06-24 DIAGNOSIS — S299XXA Unspecified injury of thorax, initial encounter: Secondary | ICD-10-CM | POA: Diagnosis not present

## 2019-06-24 DIAGNOSIS — E871 Hypo-osmolality and hyponatremia: Secondary | ICD-10-CM | POA: Diagnosis not present

## 2019-06-24 DIAGNOSIS — R531 Weakness: Secondary | ICD-10-CM | POA: Diagnosis not present

## 2019-06-24 DIAGNOSIS — Z853 Personal history of malignant neoplasm of breast: Secondary | ICD-10-CM

## 2019-06-24 DIAGNOSIS — I129 Hypertensive chronic kidney disease with stage 1 through stage 4 chronic kidney disease, or unspecified chronic kidney disease: Secondary | ICD-10-CM | POA: Diagnosis not present

## 2019-06-24 DIAGNOSIS — S0990XA Unspecified injury of head, initial encounter: Secondary | ICD-10-CM | POA: Diagnosis not present

## 2019-06-24 DIAGNOSIS — R000 Tachycardia, unspecified: Secondary | ICD-10-CM | POA: Diagnosis not present

## 2019-06-24 DIAGNOSIS — Z8249 Family history of ischemic heart disease and other diseases of the circulatory system: Secondary | ICD-10-CM

## 2019-06-24 DIAGNOSIS — Z20828 Contact with and (suspected) exposure to other viral communicable diseases: Secondary | ICD-10-CM | POA: Diagnosis not present

## 2019-06-24 DIAGNOSIS — Z833 Family history of diabetes mellitus: Secondary | ICD-10-CM

## 2019-06-24 DIAGNOSIS — S199XXA Unspecified injury of neck, initial encounter: Secondary | ICD-10-CM | POA: Diagnosis not present

## 2019-06-24 DIAGNOSIS — W1812XA Fall from or off toilet with subsequent striking against object, initial encounter: Secondary | ICD-10-CM | POA: Diagnosis present

## 2019-06-24 DIAGNOSIS — R55 Syncope and collapse: Secondary | ICD-10-CM | POA: Diagnosis not present

## 2019-06-24 DIAGNOSIS — Z9049 Acquired absence of other specified parts of digestive tract: Secondary | ICD-10-CM

## 2019-06-24 DIAGNOSIS — Z03818 Encounter for observation for suspected exposure to other biological agents ruled out: Secondary | ICD-10-CM | POA: Diagnosis not present

## 2019-06-24 DIAGNOSIS — N183 Chronic kidney disease, stage 3 (moderate): Secondary | ICD-10-CM | POA: Diagnosis not present

## 2019-06-24 DIAGNOSIS — Z841 Family history of disorders of kidney and ureter: Secondary | ICD-10-CM

## 2019-06-24 DIAGNOSIS — R0902 Hypoxemia: Secondary | ICD-10-CM | POA: Diagnosis not present

## 2019-06-24 DIAGNOSIS — E785 Hyperlipidemia, unspecified: Secondary | ICD-10-CM | POA: Diagnosis present

## 2019-06-24 DIAGNOSIS — H409 Unspecified glaucoma: Secondary | ICD-10-CM | POA: Diagnosis present

## 2019-06-24 DIAGNOSIS — E039 Hypothyroidism, unspecified: Secondary | ICD-10-CM | POA: Diagnosis present

## 2019-06-24 DIAGNOSIS — E1140 Type 2 diabetes mellitus with diabetic neuropathy, unspecified: Secondary | ICD-10-CM | POA: Diagnosis present

## 2019-06-24 DIAGNOSIS — E1122 Type 2 diabetes mellitus with diabetic chronic kidney disease: Secondary | ICD-10-CM | POA: Diagnosis not present

## 2019-06-24 DIAGNOSIS — F419 Anxiety disorder, unspecified: Secondary | ICD-10-CM | POA: Diagnosis present

## 2019-06-24 DIAGNOSIS — E860 Dehydration: Secondary | ICD-10-CM | POA: Diagnosis present

## 2019-06-24 DIAGNOSIS — Z7984 Long term (current) use of oral hypoglycemic drugs: Secondary | ICD-10-CM

## 2019-06-24 LAB — BASIC METABOLIC PANEL
Anion gap: 16 — ABNORMAL HIGH (ref 5–15)
BUN: 18 mg/dL (ref 8–23)
CO2: 26 mmol/L (ref 22–32)
Calcium: 9.6 mg/dL (ref 8.9–10.3)
Chloride: 90 mmol/L — ABNORMAL LOW (ref 98–111)
Creatinine, Ser: 1.15 mg/dL — ABNORMAL HIGH (ref 0.44–1.00)
GFR calc Af Amer: 46 mL/min — ABNORMAL LOW (ref >60)
GFR calc non Af Amer: 40 mL/min — ABNORMAL LOW (ref >60)
Glucose, Bld: 169 mg/dL — ABNORMAL HIGH (ref 70–99)
Potassium: 3.2 mmol/L — ABNORMAL LOW (ref 3.5–5.1)
Sodium: 132 mmol/L — ABNORMAL LOW (ref 135–145)

## 2019-06-24 LAB — URINALYSIS, ROUTINE W REFLEX MICROSCOPIC
Bilirubin Urine: NEGATIVE
Glucose, UA: NEGATIVE mg/dL
Hgb urine dipstick: NEGATIVE
Ketones, ur: NEGATIVE mg/dL
Leukocytes,Ua: NEGATIVE
Nitrite: NEGATIVE
Protein, ur: >=300 mg/dL — AB
Specific Gravity, Urine: 1.008 (ref 1.005–1.030)
pH: 7 (ref 5.0–8.0)

## 2019-06-24 LAB — CBC
HCT: 41 % (ref 36.0–46.0)
Hemoglobin: 14.3 g/dL (ref 12.0–15.0)
MCH: 32.9 pg (ref 26.0–34.0)
MCHC: 34.9 g/dL (ref 30.0–36.0)
MCV: 94.5 fL (ref 80.0–100.0)
Platelets: 245 10*3/uL (ref 150–400)
RBC: 4.34 MIL/uL (ref 3.87–5.11)
RDW: 11.4 % — ABNORMAL LOW (ref 11.5–15.5)
WBC: 13.9 10*3/uL — ABNORMAL HIGH (ref 4.0–10.5)
nRBC: 0 % (ref 0.0–0.2)

## 2019-06-24 LAB — CBG MONITORING, ED: Glucose-Capillary: 157 mg/dL — ABNORMAL HIGH (ref 70–99)

## 2019-06-24 LAB — GLUCOSE, CAPILLARY: Glucose-Capillary: 136 mg/dL — ABNORMAL HIGH (ref 70–99)

## 2019-06-24 LAB — SARS CORONAVIRUS 2 BY RT PCR (HOSPITAL ORDER, PERFORMED IN ~~LOC~~ HOSPITAL LAB): SARS Coronavirus 2: NEGATIVE

## 2019-06-24 LAB — CK: Total CK: 240 U/L — ABNORMAL HIGH (ref 38–234)

## 2019-06-24 MED ORDER — LEVOTHYROXINE SODIUM 100 MCG PO TABS
100.0000 ug | ORAL_TABLET | Freq: Every day | ORAL | Status: DC
  Administered 2019-06-25 – 2019-06-26 (×2): 100 ug via ORAL
  Filled 2019-06-24 (×2): qty 1

## 2019-06-24 MED ORDER — POLYVINYL ALCOHOL 1.4 % OP SOLN
1.0000 [drp] | Freq: Three times a day (TID) | OPHTHALMIC | Status: DC | PRN
  Filled 2019-06-24: qty 15

## 2019-06-24 MED ORDER — INSULIN ASPART 100 UNIT/ML ~~LOC~~ SOLN
0.0000 [IU] | Freq: Three times a day (TID) | SUBCUTANEOUS | Status: DC
  Administered 2019-06-25: 2 [IU] via SUBCUTANEOUS
  Administered 2019-06-25: 3 [IU] via SUBCUTANEOUS
  Administered 2019-06-26: 2 [IU] via SUBCUTANEOUS
  Administered 2019-06-26: 3 [IU] via SUBCUTANEOUS
  Filled 2019-06-24: qty 0.15

## 2019-06-24 MED ORDER — ATORVASTATIN CALCIUM 10 MG PO TABS
10.0000 mg | ORAL_TABLET | Freq: Every day | ORAL | Status: DC
  Administered 2019-06-24 – 2019-06-26 (×3): 10 mg via ORAL
  Filled 2019-06-24 (×4): qty 1

## 2019-06-24 MED ORDER — ACETAMINOPHEN 500 MG PO TABS
500.0000 mg | ORAL_TABLET | Freq: Every day | ORAL | Status: DC
  Administered 2019-06-24 – 2019-06-25 (×2): 500 mg via ORAL
  Filled 2019-06-24 (×2): qty 1

## 2019-06-24 MED ORDER — POTASSIUM CHLORIDE CRYS ER 20 MEQ PO TBCR
40.0000 meq | EXTENDED_RELEASE_TABLET | Freq: Once | ORAL | Status: AC
  Administered 2019-06-24: 40 meq via ORAL
  Filled 2019-06-24: qty 2

## 2019-06-24 MED ORDER — ACEBUTOLOL HCL 200 MG PO CAPS
200.0000 mg | ORAL_CAPSULE | Freq: Every day | ORAL | Status: DC
  Administered 2019-06-24 – 2019-06-25 (×2): 200 mg via ORAL
  Filled 2019-06-24 (×3): qty 1

## 2019-06-24 MED ORDER — LORAZEPAM 2 MG/ML IJ SOLN
0.5000 mg | Freq: Once | INTRAMUSCULAR | Status: AC
  Administered 2019-06-24: 0.5 mg via INTRAVENOUS
  Filled 2019-06-24: qty 1

## 2019-06-24 MED ORDER — SODIUM CHLORIDE 0.9 % IV BOLUS
1000.0000 mL | Freq: Once | INTRAVENOUS | Status: AC
  Administered 2019-06-24: 1000 mL via INTRAVENOUS

## 2019-06-24 MED ORDER — IRBESARTAN 150 MG PO TABS
150.0000 mg | ORAL_TABLET | Freq: Every day | ORAL | Status: DC
  Administered 2019-06-25 – 2019-06-26 (×2): 150 mg via ORAL
  Filled 2019-06-24 (×3): qty 1

## 2019-06-24 MED ORDER — GABAPENTIN 100 MG PO CAPS
100.0000 mg | ORAL_CAPSULE | Freq: Every day | ORAL | Status: DC
  Administered 2019-06-24 – 2019-06-25 (×2): 100 mg via ORAL
  Filled 2019-06-24 (×2): qty 1

## 2019-06-24 MED ORDER — ENOXAPARIN SODIUM 40 MG/0.4ML ~~LOC~~ SOLN
40.0000 mg | SUBCUTANEOUS | Status: DC
  Administered 2019-06-24 – 2019-06-25 (×2): 40 mg via SUBCUTANEOUS
  Filled 2019-06-24 (×2): qty 0.4

## 2019-06-24 MED ORDER — SODIUM CHLORIDE 0.9% FLUSH: 10.0000 mL | Freq: Two times a day (BID) | INTRAVENOUS | Status: DC

## 2019-06-24 MED ORDER — SODIUM CHLORIDE 0.9% FLUSH: 10.0000 mL | INTRAVENOUS | Status: DC | PRN

## 2019-06-24 MED ORDER — SODIUM CHLORIDE 0.9 % IV SOLN
INTRAVENOUS | Status: AC
  Administered 2019-06-24 – 2019-06-25 (×2): via INTRAVENOUS

## 2019-06-24 MED ORDER — AMLODIPINE BESYLATE 5 MG PO TABS
5.0000 mg | ORAL_TABLET | ORAL | Status: DC
  Administered 2019-06-25 – 2019-06-26 (×2): 5 mg via ORAL
  Filled 2019-06-24 (×2): qty 1

## 2019-06-24 MED ORDER — SODIUM CHLORIDE 0.9% FLUSH: 3.0000 mL | Freq: Once | INTRAVENOUS | Status: DC

## 2019-06-24 MED ORDER — GABAPENTIN 100 MG PO CAPS
200.0000 mg | ORAL_CAPSULE | Freq: Every day | ORAL | Status: DC
  Administered 2019-06-25 – 2019-06-26 (×2): 200 mg via ORAL
  Filled 2019-06-24 (×3): qty 2

## 2019-06-24 NOTE — TOC Initial Note (Signed)
Transition of Care [facility]) - Initial/Assessment Note    Patient Details  Name: [name] MRN: [id number] Date of Birth: October 10, 1919  Transition of Care [facility]) CM/SW Contact:    Erenest Rasher, RN Phone Number: [phone] 06/24/2019, 6:14 PM  Clinical Narrative:                 Spoke to pt's dtr, [name]. Pt lives alone her home. Dtr states she lives next door to patient. She assist with meds, cleaning, cooking and dressing pt at home. Offered choice for [facility], agreeable to [facility]. Contacted Meredeth Ide with new referral.   Expected Discharge Plan: [place] Barriers to Discharge: Continued Medical Work up   Patient Goals and CMS Choice Patient states their goals for this hospitalization and ongoing recovery are:: wants pt to remain in home CMS Medicare.gov Compare Post Acute Care list provided to:: Patient Represenative (must comment) Choice offered to / list presented to : Adult Children  Expected Discharge Plan and Services Expected Discharge Plan: [place] In-house Referral: Clinical Social Work Discharge Planning Services: CM Consult Post Acute Care Choice: Home Health   Expected Discharge Date: (unknown)                         HH Arranged: RN, PT, OT, Nurse's Aide, Social Work [employer] Agency: [place] Date [facility] Agency Contacted: 06/24/19 Time Eva: [place] Representative spoke with at [place]: [place] Arrangements/Services   Lives with:: Self Patient language and need for interpreter reviewed:: Yes Do you feel safe going back to the place where you live?: Yes      Need for Family Participation in Patient Care: Yes (Comment) Care giver support system in place?: Yes (comment) Current home services: DME(rolling walker, bedside commode) Criminal Activity/Legal Involvement Pertinent to Current Situation/Hospitalization: No - Comment as needed  Activities of Daily Living Home  Assistive Devices/Equipment: Cane (specify quad or straight), Walker (specify type), Eyeglasses, Hearing aid(single point cane, front wheeled walker, bilateral hearing aids) ADL Screening (condition at time of admission) Patient's cognitive ability adequate to safely complete daily activities?: Yes Is the patient deaf or have difficulty hearing?: Yes(wears hearing aids) Does the patient have difficulty seeing, even when wearing glasses/contacts?: No Does the patient have difficulty concentrating, remembering, or making decisions?: Yes Patient able to express need for assistance with ADLs?: Yes Does the patient have difficulty dressing or bathing?: Yes Independently performs ADLs?: No Communication: Independent Dressing (OT): Needs assistance Is this a change from baseline?: Pre-admission baseline Grooming: Needs assistance Is this a change from baseline?: Pre-admission baseline Feeding: Needs assistance Is this a change from baseline?: Pre-admission baseline Bathing: Needs assistance Is this a change from baseline?: Pre-admission baseline Toileting: Needs assistance Is this a change from baseline?: Pre-admission baseline In/Out Bed: Needs assistance Is this a change from baseline?: Pre-admission baseline Walks in Home: Needs assistance Is this a change from baseline?: Pre-admission baseline Does the patient have difficulty walking or climbing stairs?: Yes Weakness of Legs: Both Weakness of Arms/Hands: None  Permission Sought/Granted Permission sought to share information with : Case Manager, PCP, Family Supports, Other (comment) Permission granted to share information with : Yes, Verbal Permission Granted  Share Information with NAME: [name]  Permission granted to share info w AGENCY: [facility]  Permission granted to share info w Relationship: daughter  Permission granted to share info w Contact Information:  [phone]  Emotional Assessment       Orientation: : Oriented to  Self, Oriented to Place, Oriented to  Time, Oriented to Situation   Psych Involvement: No (comment)  Admission diagnosis:  near syncope Patient Active Problem List   Diagnosis Date Noted  . Pain due to onychomycosis of toenails of both feet 05/18/2019  . Diabetic neuropathy ([place]) 05/18/2019  . Macrocytic anemia 12/08/2018  . Dehydration 08/06/2018  . Hospital discharge follow-up 08/06/2018  . Abnormal liver function   . Acute lower UTI 07/31/2018  . Sepsis ([place]) 07/31/2018  . Hypokalemia 07/31/2018  . AKI (acute kidney injury) ([place]) 07/31/2018  . CKD (chronic kidney disease) stage 3, GFR 30-59 ml/min (HCC) 07/09/2018  . Urinary frequency 04/14/2018  . Hyperlipidemia 04/08/2017  . Right knee pain 12/10/2016  . Osteoarthritis 04/23/2016  . Peripheral neuropathy 04/23/2016  . Joint pain, hip 06/22/2015  . Skin lesion of face 06/10/2013  . General medical examination 10/30/2011  . Cancer of overlapping sites of right female breast ([place]) 10/09/2011  . Hypothyroidism 12/13/2010  . Diabetes mellitus type II, controlled ([place]) 12/13/2010  . Gout 12/13/2010  . ANXIETY 12/13/2010  . Essential hypertension 12/13/2010   PCP:  [name], MD Pharmacy:   [facility] [address], [place] [address] Phone: [phone] Fax: [phone]     Social Determinants of Health (SDOH) Interventions    Readmission Risk Interventions No flowsheet data found.

## 2019-06-24 NOTE — ED Notes (Signed)
IV team at bedside 

## 2019-06-24 NOTE — Progress Notes (Signed)
Attempted to get pt down starting at 8pm. RN finally called back at 850 stating the pt had an adverse reaction to ativan and is not able to come down. Will let DR know

## 2019-06-24 NOTE — ED Notes (Signed)
IN AND OUT WAS PERFORMED. NURSE AWARE.

## 2019-06-24 NOTE — ED Notes (Addendum)
Patient transported to CT 

## 2019-06-24 NOTE — ED Notes (Signed)
Patient transported to X-ray 

## 2019-06-24 NOTE — Progress Notes (Signed)
Assessed B arms with ultrasound. Unable to start PIV despite several attempts. Nila Nephew, RN made aware.

## 2019-06-24 NOTE — H&P (Addendum)
History and Physical  [name] AYT:[id number] DOB: Dec 22, 1918 DOA: 06/24/2019  Referring physician: EDP PCP: [name], MD   Chief Complaint: syncope  HPI: [name] is a 85 y.o. female  H/o noninsulin dependent DM2, HTN, hypothyroidism is sent to the hospital due to syncope event. patient lives byherself, she does not recall event, could not tell how long she is on the floor, her family found her laying on the floor and called EMS. She is currently feeling back to her baseline, she states " I wonder what has happened to me". She denies fever, no sob, denies pain.   ED course: patient reports is back to her baseline, vital signs are stable, ct head/cervical spine no acute findings, EKG with sinus rhythm , prolonged Qtc at 625, labs  wbc 13.9, sodium 132, k 3.2, glucose 169, cr 1.15,  Ck240, ua and urine culture pending collection, she does not have a IV put in yet. hospitalist called to syncope work up.  Review of Systems:  Detail per HPI, Review of systems are otherwise negative  Past Medical History:  Diagnosis Date   Anxiety    loss of child 1 year ago   Cancer ([place])    right breast   Diabetes mellitus, type 2 ([place])    Glaucoma    Gout    Hyperlipidemia    Hypertension    states eccho Dr Mare Ferrari 1 year ago- no record found in EPIC or office records   Hyperthyroidism    Past Surgical History:  Procedure Laterality Date   [place] LUMPECTOMY  11/06/2011   Procedure: LUMPECTOMY;  Surgeon: Harl Bowie, MD;  Location: [facility] ORS;  Service: General;  Laterality: Right;   [place]   right   Social History:  reports that she has never smoked. She has never used smokeless tobacco. She reports that she does not drink alcohol or use drugs. Patient lives at home & is able to participate in activities of daily living independently   No Known Allergies  Family History  Problem  Relation Age of Onset   Cancer Mother        bladder   Kidney disease Mother    Heart attack Brother    Stroke Father    Hypertension Sister    Hyperlipidemia Daughter    Diabetes Sister       Prior to Admission medications   Medication Sig Start Date End Date Taking? Authorizing Provider  acebutolol (SECTRAL) 200 MG capsule Take 1 capsule (200 mg total) by mouth at bedtime. 06/08/19  Yes [name], MD  acetaminophen (TYLENOL) 500 MG tablet Take 500 mg by mouth at bedtime.   Yes [provider]  amLODipine (NORVASC) 5 MG tablet Take 1 tablet (5 mg total) by mouth every morning. 06/08/19  Yes [name], MD  atorvastatin (LIPITOR) 10 MG tablet Take 1 tablet (10 mg total) by mouth daily. 06/08/19  Yes [name], MD  gabapentin (NEURONTIN) 100 MG capsule Take 1-2 capsules (100-200 mg total) by mouth 3 (three) times daily. 200 mg in the am and 100 mg at bedtime Patient taking differently: Take 100-200 mg by mouth See admin instructions. Take 200 mg by mouth in the morning and 100 mg by mouth at bedtime 12/08/18  Yes [name], MD  glimepiride (AMARYL) 1 MG tablet Take 1 tablet (1 mg total) by mouth every morning.  12/08/18 12/08/19 Yes [name], MD  hydroxypropyl methylcellulose / hypromellose (ISOPTO TEARS / GONIOVISC) 2.5 % ophthalmic solution Place 1 drop into both eyes 3 (three) times daily as needed for dry eyes.   Yes [provider]  levothyroxine (SYNTHROID) 100 MCG tablet Take 1 tablet (100 mcg total) by mouth daily before breakfast. 06/08/19  Yes [name], MD  sitaGLIPtin (JANUVIA) 50 MG tablet Take 1 tablet (50 mg total) by mouth every morning. 06/08/19  Yes [name], MD  telmisartan (MICARDIS) 80 MG tablet Take 1 tablet (80 mg total) by mouth daily. 05/10/19  Yes [name], MD  doxycycline (VIBRA-TABS) 100 MG tablet Take 1 tablet (100 mg total) by mouth 2 (two)  times daily. Patient not taking: Reported on 06/24/2019 06/08/19   [name], MD  glucose blood (TRUETRACK TEST) test strip Use as instructed to check blood sugar once a day.  Dx E11.9 12/29/18   [name], MD  TRUEPLUS LANCETS 33G MISC Use to check blood sugar once a day. Dx E11.9 12/29/18   [name], MD    Physical Exam: BP (!) 160/85    Pulse 78    Temp 98.4 F (36.9 C) (Oral)    Resp 16    SpO2 94%    General:  NAD, alert and interactive, following commands, very hard of hearing  Eyes: PERRL  ENT: unremarkable  Neck: supple, no JVD  Cardiovascular: RRR  Respiratory: CTABL  Abdomen: soft/NT/ND, positive bowel sounds  Skin: no rash  Musculoskeletal:  No edema  Psychiatric: calm/cooperative  Neurologic: no focal findings  , equal strength bilateral upper and lower extremities           Labs on Admission:  Basic Metabolic Panel: Recent Labs  Lab 06/24/19 1217  NA 132*  K 3.2*  CL 90*  CO2 26  GLUCOSE 169*  BUN 18  CREATININE 1.15*  CALCIUM 9.6   Liver Function Tests: No results for input(s): AST, ALT, ALKPHOS, BILITOT, PROT, ALBUMIN in the last 168 hours. No results for input(s): LIPASE, AMYLASE in the last 168 hours. No results for input(s): AMMONIA in the last 168 hours. CBC: Recent Labs  Lab 06/24/19 1217  WBC 13.9*  HGB 14.3  HCT 41.0  MCV 94.5  PLT 245   Cardiac Enzymes: Recent Labs  Lab 06/24/19 1217  CKTOTAL 240*    BNP (last 3 results) No results for input(s): BNP in the last 8760 hours.  ProBNP (last 3 results) No results for input(s): PROBNP in the last 8760 hours.  CBG: Recent Labs  Lab 06/24/19 1245  GLUCAP 157*    Radiological Exams on Admission: Dg Chest 2 View  Result Date: 06/24/2019 CLINICAL DATA:  Fall weakness in lower extremities EXAM: CHEST - 2 VIEW COMPARISON:  None. FINDINGS: The lungs are clear without focal consolidation, edema, effusion or pneumothorax. There is mild  cardiomegaly. There is atherosclerotic calcification the aortic knob with a tortuous descending aorta. Degenerative changes seen in the midthoracic spine. No acute osseous findings. Surgical clips in the upper quadrant. IMPRESSION: No acute cardiopulmonary process. Electronically Signed   By: Prudencio Pair M.D.   On: 06/24/2019 12:09   Ct Head Wo Contrast  Result Date: 06/24/2019 CLINICAL DATA:  Found on floor. EXAM: CT HEAD WITHOUT CONTRAST CT CERVICAL SPINE WITHOUT CONTRAST TECHNIQUE: Multidetector CT imaging of the head and cervical spine was performed following the standard protocol without intravenous contrast. Multiplanar CT image reconstructions of the  cervical spine were also generated. COMPARISON:  None. FINDINGS: CT HEAD FINDINGS Brain: Generalized atrophy. Patchy bilateral white matter disease appears chronic. No acute cortical infarct. No acute hemorrhage or mass. Vascular: Negative for hyperdense vessel. Atherosclerotic calcification. Skull: Negative Sinuses/Orbits: Mild mucosal edema paranasal sinuses. Bilateral cataract surgery Other: None CT CERVICAL SPINE FINDINGS Alignment: Mild anterolisthesis C5-6.  Mild anterolisthesis T1-2. Skull base and vertebrae: Negative for fracture Soft tissues and spinal canal: Negative for soft tissue mass. Atherosclerotic disease. Disc levels: Extensive multilevel facet degeneration throughout the cervical spine. Disc degeneration and mild spurring at C6 7. No significant spinal stenosis. Upper chest: A pickle scarring bilaterally. Other: None IMPRESSION: 1. No acute intracranial abnormality. Atrophy and mild chronic microvascular ischemic change in the white matter 2. Negative for cervical spine fracture. Multilevel degenerative change. Electronically Signed   By: Franchot Gallo M.D.   On: 06/24/2019 11:43   Ct Cervical Spine Wo Contrast  Result Date: 06/24/2019 CLINICAL DATA:  Found on floor. EXAM: CT HEAD WITHOUT CONTRAST CT CERVICAL SPINE WITHOUT CONTRAST  TECHNIQUE: Multidetector CT imaging of the head and cervical spine was performed following the standard protocol without intravenous contrast. Multiplanar CT image reconstructions of the cervical spine were also generated. COMPARISON:  None. FINDINGS: CT HEAD FINDINGS Brain: Generalized atrophy. Patchy bilateral white matter disease appears chronic. No acute cortical infarct. No acute hemorrhage or mass. Vascular: Negative for hyperdense vessel. Atherosclerotic calcification. Skull: Negative Sinuses/Orbits: Mild mucosal edema paranasal sinuses. Bilateral cataract surgery Other: None CT CERVICAL SPINE FINDINGS Alignment: Mild anterolisthesis C5-6.  Mild anterolisthesis T1-2. Skull base and vertebrae: Negative for fracture Soft tissues and spinal canal: Negative for soft tissue mass. Atherosclerotic disease. Disc levels: Extensive multilevel facet degeneration throughout the cervical spine. Disc degeneration and mild spurring at C6 7. No significant spinal stenosis. Upper chest: A pickle scarring bilaterally. Other: None IMPRESSION: 1. No acute intracranial abnormality. Atrophy and mild chronic microvascular ischemic change in the white matter 2. Negative for cervical spine fracture. Multilevel degenerative change. Electronically Signed   By: Franchot Gallo M.D.   On: 06/24/2019 11:43    EKG: Independently reviewed. *  Assessment/Plan Present on Admission: **None**    Syncope? -patient lives byherself, she does not recall event, could not tell how long she is on the floor,  -I requested ED to get orthostatic vital signs, will get mri brain , Keep on tele, -May need outpatient event monitor  Leukocytosis No fever, cxr no acute findings ua /urine culture in process Will hold of abx for now ( per chart review she has h/o pseudomonas UTI in 2019)  Hypokalemia Replace k, repeat lab in am  Hyponatremia Appear dehydrated , On hydration, repeat lab in am  noninsulin dependent dm2 Hold home oral  meds, on ssi  HTN; continue home meds  Hypothyroidism : continue home meds  DVT prophylaxis: lovenox  Consultants: none  Code Status: full   Family Communication:  Patient , attempt to call daughter, not able to reach her  Disposition Plan: med tele observation  Time spent: 89mins  Florencia Reasons MD, PhD, FACP [facility] Pager [phone] If 7PM-7AM, please contact night-coverage at [email or web address], password [facility]

## 2019-06-24 NOTE — ED Notes (Signed)
x2 unsuccessful IV attempts

## 2019-06-24 NOTE — ED Notes (Signed)
ED TO INPATIENT HANDOFF REPORT  Name/Age/Gender [name] 85 y.o. female  Code Status Code Status History    Date Active Date Inactive Code Status Order ID Comments User Context   07/31/2018 2217 08/02/2018 1704 Full Code [id number]  Jani Gravel, MD Inpatient   Advance Care Planning Activity      Home/SNF/Other Home  Chief Complaint near syncope  Level of Care/Admitting Diagnosis ED Disposition    ED Disposition Condition [place]: [facility] [[id number]]  Level of Care: Telemetry [5]  Admit to tele based on following criteria: Eval of Syncope  Covid Evaluation: Confirmed COVID Negative  Diagnosis: Syncope [206001]  Admitting Physician: Florencia Reasons [[id number]]  Attending Physician: Florencia Reasons [[id number]]  PT Class (Do Not Modify): Observation [104]  PT Acc Code (Do Not Modify): Observation [10022]       Medical History Past Medical History:  Diagnosis Date  . Anxiety    loss of child 1 year ago  . Cancer ([place])    right breast  . Diabetes mellitus, type 2 ([place])   . Glaucoma   . Gout   . Hyperlipidemia   . Hypertension    states eccho Dr Mare Ferrari 1 year ago- no record found in [facility] or office records  . Hyperthyroidism     Allergies No Known Allergies  IV Location/Drains/Wounds Patient Lines/Drains/Airways Status   Active Line/Drains/Airways    Name:   Placement date:   Placement time:   Site:   Days:   Midline Single Lumen 06/24/19 Midline Right Cephalic 10 cm 0 cm   76/28/31    5176    Cephalic   less than 1   External Urinary Catheter   08/01/18    1257    -   327   Incision 11/06/11 Breast Right   11/06/11    1350     2787          Labs/Imaging Results for orders placed or performed during the hospital encounter of 06/24/19 (from the past 48 hour(s))  Basic metabolic panel     Status: Abnormal   Collection Time: 06/24/19 12:17 PM  Result Value Ref Range   Sodium 132 (L) 135 - 145 mmol/L   Potassium 3.2 (L) 3.5 -  5.1 mmol/L   Chloride 90 (L) 98 - 111 mmol/L   CO2 26 22 - 32 mmol/L   Glucose, Bld 169 (H) 70 - 99 mg/dL   BUN 18 8 - 23 mg/dL   Creatinine, Ser 1.15 (H) 0.44 - 1.00 mg/dL   Calcium 9.6 8.9 - 10.3 mg/dL   GFR calc non Af Amer 40 (L) >60 mL/min   GFR calc Af Amer 46 (L) >60 mL/min   Anion gap 16 (H) 5 - 15    Comment: Performed at [facility], [place] [address], [place], [place] [address]  CBC     Status: Abnormal   Collection Time: 06/24/19 12:17 PM  Result Value Ref Range   WBC 13.9 (H) 4.0 - 10.5 K/uL   RBC 4.34 3.87 - 5.11 MIL/uL   Hemoglobin 14.3 12.0 - 15.0 g/dL   HCT 41.0 36.0 - 46.0 %   MCV 94.5 80.0 - 100.0 fL   MCH 32.9 26.0 - 34.0 pg   MCHC 34.9 30.0 - 36.0 g/dL   RDW 11.4 (L) 11.5 - 15.5 %   Platelets 245 150 - 400 K/uL   nRBC 0.0 0.0 - 0.2 %  Comment: Performed at [facility], [place] [address] [place], [place] [address]  CK     Status: Abnormal   Collection Time: 06/24/19 12:17 PM  Result Value Ref Range   Total CK 240 (H) 38 - 234 U/L    Comment: Performed at [facility], [place] [address] [place], [place] [address]  CBG monitoring, ED     Status: Abnormal   Collection Time: 06/24/19 12:45 PM  Result Value Ref Range   Glucose-Capillary 157 (H) 70 - 99 mg/dL  Urinalysis, Routine w reflex microscopic     Status: Abnormal   Collection Time: 06/24/19  3:20 PM  Result Value Ref Range   Color, Urine STRAW (A) YELLOW   APPearance CLEAR CLEAR   Specific Gravity, Urine 1.008 1.005 - 1.030   pH 7.0 5.0 - 8.0   Glucose, UA NEGATIVE NEGATIVE mg/dL   Hgb urine dipstick NEGATIVE NEGATIVE   Bilirubin Urine NEGATIVE NEGATIVE   Ketones, ur NEGATIVE NEGATIVE mg/dL   Protein, ur >=300 (A) NEGATIVE mg/dL   Nitrite NEGATIVE NEGATIVE   Leukocytes,Ua NEGATIVE NEGATIVE   RBC / HPF 0-5 0 - 5 RBC/hpf   WBC, UA 0-5 0 - 5 WBC/hpf   Bacteria, UA RARE (A) NONE SEEN    Comment: Performed at [facility], [place] [address], [place],  [address]  SARS Coronavirus 2 [facility] order, Performed in [facility] hospital lab) Nasopharyngeal Nasopharyngeal Swab     Status: None   Collection Time: 06/24/19  3:57 PM   Specimen: Nasopharyngeal Swab  Result Value Ref Range   SARS Coronavirus 2 NEGATIVE NEGATIVE    Comment: (NOTE) If result is NEGATIVE SARS-CoV-2 target nucleic acids are NOT DETECTED. The SARS-CoV-2 RNA is generally detectable in upper and lower  respiratory specimens during the acute phase of infection. The lowest  concentration of SARS-CoV-2 viral copies this assay can detect is 250  copies / mL. A negative result does not preclude SARS-CoV-2 infection  and should not be used as the sole basis for treatment or other  patient management decisions.  A negative result may occur with  improper specimen collection / handling, submission of specimen other  than nasopharyngeal swab, presence of viral mutation(s) within the  areas targeted by this assay, and inadequate number of viral copies  (<250 copies / mL). A negative result must be combined with clinical  observations, patient history, and epidemiological information. If result is POSITIVE SARS-CoV-2 target nucleic acids are DETECTED. The SARS-CoV-2 RNA is generally detectable in upper and lower  respiratory specimens dur ing the acute phase of infection.  Positive  results are indicative of active infection with SARS-CoV-2.  Clinical  correlation with patient history and other diagnostic information is  necessary to determine patient infection status.  Positive results do  not rule out bacterial infection or co-infection with other viruses. If result is PRESUMPTIVE POSTIVE SARS-CoV-2 nucleic acids MAY BE PRESENT.   A presumptive positive result was obtained on the submitted specimen  and confirmed on repeat testing.  While 2019 novel coronavirus  (SARS-CoV-2) nucleic acids may be present in the submitted sample  additional  confirmatory testing may be necessary for epidemiological  and / or clinical management purposes  to differentiate between  SARS-CoV-2 and other Sarbecovirus currently known to infect humans.  If clinically indicated additional testing with an alternate test  methodology [phone]) is advised. The SARS-CoV-2 RNA is generally  detectable in upper and lower respiratory sp ecimens during  the acute  phase of infection. The expected result is Negative. Fact Sheet for Patients:  StrictlyIdeas.no Fact Sheet for Healthcare Providers: BankingDealers.co.za This test is not yet approved or cleared by the [place] FDA and has been authorized for detection and/or diagnosis of SARS-CoV-2 by FDA under an Emergency Use Authorization (EUA).  This EUA will remain in effect (meaning this test can be used) for the duration of the COVID-19 declaration under Section 564(b)(1) of the Act, 21 U.S.C. section 360bbb-3(b)(1), unless the authorization is terminated or revoked sooner. Performed at [facility], [place] [address], [place], [place] [address]    Dg Chest 2 View  Result Date: 06/24/2019 CLINICAL DATA:  Fall weakness in lower extremities EXAM: CHEST - 2 VIEW COMPARISON:  None. FINDINGS: The lungs are clear without focal consolidation, edema, effusion or pneumothorax. There is mild cardiomegaly. There is atherosclerotic calcification the aortic knob with a tortuous descending aorta. Degenerative changes seen in the midthoracic spine. No acute osseous findings. Surgical clips in the upper quadrant. IMPRESSION: No acute cardiopulmonary process. Electronically Signed   By: Prudencio Pair M.D.   On: 06/24/2019 12:09   Ct Head Wo Contrast  Result Date: 06/24/2019 CLINICAL DATA:  Found on floor. EXAM: CT HEAD WITHOUT CONTRAST CT CERVICAL SPINE WITHOUT CONTRAST TECHNIQUE: Multidetector CT imaging of the head and cervical spine was performed following  the standard protocol without intravenous contrast. Multiplanar CT image reconstructions of the cervical spine were also generated. COMPARISON:  None. FINDINGS: CT HEAD FINDINGS Brain: Generalized atrophy. Patchy bilateral white matter disease appears chronic. No acute cortical infarct. No acute hemorrhage or mass. Vascular: Negative for hyperdense vessel. Atherosclerotic calcification. Skull: Negative Sinuses/Orbits: Mild mucosal edema paranasal sinuses. Bilateral cataract surgery Other: None CT CERVICAL SPINE FINDINGS Alignment: Mild anterolisthesis C5-6.  Mild anterolisthesis T1-2. Skull base and vertebrae: Negative for fracture Soft tissues and spinal canal: Negative for soft tissue mass. Atherosclerotic disease. Disc levels: Extensive multilevel facet degeneration throughout the cervical spine. Disc degeneration and mild spurring at C6 7. No significant spinal stenosis. Upper chest: A pickle scarring bilaterally. Other: None IMPRESSION: 1. No acute intracranial abnormality. Atrophy and mild chronic microvascular ischemic change in the white matter 2. Negative for cervical spine fracture. Multilevel degenerative change. Electronically Signed   By: Franchot Gallo M.D.   On: 06/24/2019 11:43   Ct Cervical Spine Wo Contrast  Result Date: 06/24/2019 CLINICAL DATA:  Found on floor. EXAM: CT HEAD WITHOUT CONTRAST CT CERVICAL SPINE WITHOUT CONTRAST TECHNIQUE: Multidetector CT imaging of the head and cervical spine was performed following the standard protocol without intravenous contrast. Multiplanar CT image reconstructions of the cervical spine were also generated. COMPARISON:  None. FINDINGS: CT HEAD FINDINGS Brain: Generalized atrophy. Patchy bilateral white matter disease appears chronic. No acute cortical infarct. No acute hemorrhage or mass. Vascular: Negative for hyperdense vessel. Atherosclerotic calcification. Skull: Negative Sinuses/Orbits: Mild mucosal edema paranasal sinuses. Bilateral cataract surgery  Other: None CT CERVICAL SPINE FINDINGS Alignment: Mild anterolisthesis C5-6.  Mild anterolisthesis T1-2. Skull base and vertebrae: Negative for fracture Soft tissues and spinal canal: Negative for soft tissue mass. Atherosclerotic disease. Disc levels: Extensive multilevel facet degeneration throughout the cervical spine. Disc degeneration and mild spurring at C6 7. No significant spinal stenosis. Upper chest: A pickle scarring bilaterally. Other: None IMPRESSION: 1. No acute intracranial abnormality. Atrophy and mild chronic microvascular ischemic change in the white matter 2. Negative for cervical spine fracture. Multilevel degenerative change. Electronically Signed   By: Franchot Gallo M.D.  On: 06/24/2019 11:43    Pending Labs Unresulted Labs (From admission, onward)    Start     Ordered   06/24/19 1100  Urine culture  ONCE - STAT,   STAT     06/24/19 1059   Signed and Held  CBC  (enoxaparin (LOVENOX)    CrCl >/= 30 ml/min)  Once,   R    Comments: Baseline for enoxaparin therapy IF NOT ALREADY DRAWN.  Notify MD if PLT < 100 K.    Signed and Held   Signed and Held  Creatinine, serum  (enoxaparin (LOVENOX)    CrCl >/= 30 ml/min)  Once,   R    Comments: Baseline for enoxaparin therapy IF NOT ALREADY DRAWN.    Signed and Held   Signed and Held  Creatinine, serum  (enoxaparin (LOVENOX)    CrCl >/= 30 ml/min)  Weekly,   R    Comments: while on enoxaparin therapy    Signed and Held          Vitals/Pain Today's Vitals   06/24/19 1430 06/24/19 1600 06/24/19 1647 06/24/19 1700  BP: (!) 160/85 (!) 162/85 (!) 169/60 (!) 156/82  Pulse: 78 77 85 81  Resp: 16 18 18  (!) 25  Temp:      TempSrc:      SpO2: 94% (!) 88% 100% 100%  PainSc:        Isolation Precautions No active isolations  Medications Medications  sodium chloride flush (NS) 0.9 % injection 10-40 mL (has no administration in time range)  sodium chloride flush (NS) 0.9 % injection 10-40 mL (has no administration in time  range)  0.9 %  sodium chloride infusion (has no administration in time range)  insulin aspart (novoLOG) injection 0-15 Units (has no administration in time range)  irbesartan (AVAPRO) tablet 150 mg (has no administration in time range)  sodium chloride 0.9 % bolus 1,000 mL (1,000 mLs Intravenous New Bag/Given (Non-Interop) 06/24/19 1838)  potassium chloride SA (K-DUR) CR tablet 40 mEq (40 mEq Oral Given 06/24/19 1516)    Mobility walks with device

## 2019-06-24 NOTE — ED Provider Notes (Signed)
[facility] DEPT Provider Note   CSN: [id number] Arrival date & time: 06/24/19  1039    History   Chief Complaint Chief Complaint  Patient presents with   Near Syncope    HPI ANNISHA BAAR is a 85 y.o. female with a past medical history of hypertension, hyperlipidemia, diabetes who presents to ED for fall that occurred at unknown time.  Patient states that she was sitting on the commode and having a bowel movement when she fell and hit the bathtub.  She is unsure exactly when this happened and she is unsure how long she laid in the bathroom for.  EMS states that family was concerned about weakness to bilateral lower extremities over the past 6 months and was found on the floor by the family this morning.  Patient denies any loss of consciousness.  She denies any neck pain, back pain, hip pain, numbness in arms or legs or vision changes.  Remainder of history is limited as patient is hard of hearing and does not recall the event in its entirety, no family members at bedside.  Patient resides at home by herself states that her daughter lives across the street from her.     HPI  Past Medical History:  Diagnosis Date   Anxiety    loss of child 1 year ago   Cancer ([place])    right breast   Diabetes mellitus, type 2 ([place])    Glaucoma    Gout    Hyperlipidemia    Hypertension    states eccho Dr Mare Ferrari 1 year ago- no record found in [facility] or office records   Hyperthyroidism     Patient Active Problem List   Diagnosis Date Noted   Pain due to onychomycosis of toenails of both feet 05/18/2019   Diabetic neuropathy ([place]) 05/18/2019   Macrocytic anemia 12/08/2018   Dehydration 08/06/2018   Hospital discharge follow-up 08/06/2018   Abnormal liver function    Acute lower UTI 07/31/2018   Sepsis ([place]) 07/31/2018   Hypokalemia 07/31/2018   AKI (acute kidney injury) ([place]) 07/31/2018   CKD (chronic kidney disease) stage 3, GFR 30-59  ml/min ([facility]) 07/09/2018   Urinary frequency 04/14/2018   Hyperlipidemia 04/08/2017   Right knee pain 12/10/2016   Osteoarthritis 04/23/2016   Peripheral neuropathy 04/23/2016   Joint pain, hip 06/22/2015   Skin lesion of face 06/10/2013   General medical examination 10/30/2011   Cancer of overlapping sites of right female breast ([place]) 10/09/2011   Hypothyroidism 12/13/2010   Diabetes mellitus type II, controlled ([place]) 12/13/2010   Gout 12/13/2010   ANXIETY 12/13/2010   Essential hypertension 12/13/2010    Past Surgical History:  Procedure Laterality Date   ABDOMINAL HYSTERECTOMY  1995   BREAST LUMPECTOMY  11/06/2011   Procedure: LUMPECTOMY;  Surgeon: Harl Bowie, MD;  Location: [facility];  Service: General;  Laterality: Right;   [place]   right     OB History   No obstetric history on file.      Home Medications    Prior to Admission medications   Medication Sig Start Date End Date Taking? Authorizing Provider  acebutolol (SECTRAL) 200 MG capsule Take 1 capsule (200 mg total) by mouth at bedtime. 06/08/19  Yes [name], MD  acetaminophen (TYLENOL) 500 MG tablet Take 500 mg by mouth at bedtime.   Yes [provider]  amLODipine (NORVASC) 5 MG tablet Take 1 tablet (  5 mg total) by mouth every morning. 06/08/19  Yes [name], MD  atorvastatin (LIPITOR) 10 MG tablet Take 1 tablet (10 mg total) by mouth daily. 06/08/19  Yes [name], MD  gabapentin (NEURONTIN) 100 MG capsule Take 1-2 capsules (100-200 mg total) by mouth 3 (three) times daily. 200 mg in the am and 100 mg at bedtime Patient taking differently: Take 100-200 mg by mouth See admin instructions. Take 200 mg by mouth in the morning and 100 mg by mouth at bedtime 12/08/18  Yes [name], MD  glimepiride [facility]) 1 MG tablet Take 1 tablet (1 mg total) by mouth every morning. 12/08/18 12/08/19 Yes  [name], MD  hydroxypropyl methylcellulose / hypromellose (ISOPTO TEARS / GONIOVISC) 2.5 % ophthalmic solution Place 1 drop into both eyes 3 (three) times daily as needed for dry eyes.   Yes [provider]  levothyroxine (SYNTHROID) 100 MCG tablet Take 1 tablet (100 mcg total) by mouth daily before breakfast. 06/08/19  Yes [name], MD  sitaGLIPtin (JANUVIA) 50 MG tablet Take 1 tablet (50 mg total) by mouth every morning. 06/08/19  Yes [name], MD  telmisartan (MICARDIS) 80 MG tablet Take 1 tablet (80 mg total) by mouth daily. 05/10/19  Yes [name], MD  doxycycline (VIBRA-TABS) 100 MG tablet Take 1 tablet (100 mg total) by mouth 2 (two) times daily. Patient not taking: Reported on 06/24/2019 06/08/19   [name], MD  glucose blood (TRUETRACK TEST) test strip Use as instructed to check blood sugar once a day.  Dx E11.9 12/29/18   [name], MD  TRUEPLUS LANCETS 33G MISC Use to check blood sugar once a day. Dx E11.9 12/29/18   [name], MD    Family History Family History  Problem Relation Age of Onset   Cancer Mother        bladder   Kidney disease Mother    Heart attack Brother    Stroke Father    Hypertension Sister    Hyperlipidemia Daughter    Diabetes Sister     Social History Social History   Tobacco Use   Smoking status: Never Smoker   Smokeless tobacco: Never Used  Substance Use Topics   Alcohol use: No   Drug use: No     Allergies   Patient has no known allergies.   Review of Systems Review of Systems  Constitutional: Negative for appetite change, chills and fever.  HENT: Negative for ear pain, rhinorrhea, sneezing and sore throat.   Eyes: Negative for photophobia and visual disturbance.  Respiratory: Negative for cough, chest tightness, shortness of breath and wheezing.   Cardiovascular: Negative for chest pain and palpitations.  Gastrointestinal:  Negative for abdominal pain, blood in stool, constipation, diarrhea, nausea and vomiting.  Genitourinary: Negative for dysuria, hematuria and urgency.  Musculoskeletal: Negative for myalgias.  Skin: Negative for rash.  Neurological: Negative for dizziness, weakness and light-headedness.     Physical Exam Updated Vital Signs BP (!) 160/85    Pulse 78    Temp 98.4 F (36.9 C) (Oral)    Resp 16    SpO2 94%   Physical Exam Vitals signs and nursing note reviewed.  Constitutional:      General: She is not in acute distress.    Appearance: She is well-developed.     Comments: Hard of hearing.  HENT:     Head: Normocephalic and atraumatic.     Nose: Nose  normal.  Eyes:     General: No scleral icterus.       Right eye: No discharge.        Left eye: No discharge.     Conjunctiva/sclera: Conjunctivae normal.  Neck:     Musculoskeletal: Normal range of motion and neck supple.  Cardiovascular:     Rate and Rhythm: Normal rate and regular rhythm.     Heart sounds: Normal heart sounds. No murmur. No friction rub. No gallop.   Pulmonary:     Effort: Pulmonary effort is normal. No respiratory distress.     Breath sounds: Normal breath sounds.  Abdominal:     General: Bowel sounds are normal. There is no distension.     Palpations: Abdomen is soft.     Tenderness: There is no abdominal tenderness. There is no guarding.  Musculoskeletal: Normal range of motion.  Skin:    General: Skin is warm and dry.     Findings: No rash.  Neurological:     General: No focal deficit present.     Mental Status: She is alert and oriented to person, place, and time.     Cranial Nerves: No cranial nerve deficit.     Sensory: No sensory deficit.     Motor: No abnormal muscle tone.     Coordination: Coordination normal.     Comments: Alert and oriented to place, situation and current events.  Believes that the year is 1922 but able to name the current president. Ptosis of L eyelid. PERRL. Pupils reactive.  No facial asymmetry noted. Cranial nerves appear grossly intact. Sensation intact to light touch on face, BUE and BLE. Strength 5/5 in BUE; strength 4/5 in BLE.      ED Treatments / Results  Labs (all labs ordered are listed, but only abnormal results are displayed) Labs Reviewed  BASIC METABOLIC PANEL - Abnormal; Notable for the following components:      Result Value   Sodium 132 (*)    Potassium 3.2 (*)    Chloride 90 (*)    Glucose, Bld 169 (*)    Creatinine, Ser 1.15 (*)    GFR calc non Af Amer 40 (*)    GFR calc Af Amer 46 (*)    Anion gap 16 (*)    All other components within normal limits  CBC - Abnormal; Notable for the following components:   WBC 13.9 (*)    RDW 11.4 (*)    All other components within normal limits  URINALYSIS, ROUTINE W REFLEX MICROSCOPIC - Abnormal; Notable for the following components:   Color, Urine STRAW (*)    Protein, ur >=300 (*)    Bacteria, UA RARE (*)    All other components within normal limits  CK - Abnormal; Notable for the following components:   Total CK 240 (*)    All other components within normal limits  CBG MONITORING, ED - Abnormal; Notable for the following components:   Glucose-Capillary 157 (*)    All other components within normal limits  URINE CULTURE  SARS CORONAVIRUS 2 (HOSPITAL ORDER, [facility] [place] [facility])    EKG None  Radiology Dg Chest 2 View  Result Date: 06/24/2019 CLINICAL DATA:  Fall weakness in lower extremities EXAM: CHEST - 2 VIEW COMPARISON:  None. FINDINGS: The lungs are clear without focal consolidation, edema, effusion or pneumothorax. There is mild cardiomegaly. There is atherosclerotic calcification the aortic knob with a tortuous descending aorta. Degenerative changes seen in the midthoracic  spine. No acute osseous findings. Surgical clips in the upper quadrant. IMPRESSION: No acute cardiopulmonary process. Electronically Signed   By: Prudencio Pair M.D.   On: 06/24/2019 12:09   Ct  Head Wo Contrast  Result Date: 06/24/2019 CLINICAL DATA:  Found on floor. EXAM: CT HEAD WITHOUT CONTRAST CT CERVICAL SPINE WITHOUT CONTRAST TECHNIQUE: Multidetector CT imaging of the head and cervical spine was performed following the standard protocol without intravenous contrast. Multiplanar CT image reconstructions of the cervical spine were also generated. COMPARISON:  None. FINDINGS: CT HEAD FINDINGS Brain: Generalized atrophy. Patchy bilateral white matter disease appears chronic. No acute cortical infarct. No acute hemorrhage or mass. Vascular: Negative for hyperdense vessel. Atherosclerotic calcification. Skull: Negative Sinuses/Orbits: Mild mucosal edema paranasal sinuses. Bilateral cataract surgery Other: None CT CERVICAL SPINE FINDINGS Alignment: Mild anterolisthesis C5-6.  Mild anterolisthesis T1-2. Skull base and vertebrae: Negative for fracture Soft tissues and spinal canal: Negative for soft tissue mass. Atherosclerotic disease. Disc levels: Extensive multilevel facet degeneration throughout the cervical spine. Disc degeneration and mild spurring at C6 7. No significant spinal stenosis. Upper chest: A pickle scarring bilaterally. Other: None IMPRESSION: 1. No acute intracranial abnormality. Atrophy and mild chronic microvascular ischemic change in the white matter 2. Negative for cervical spine fracture. Multilevel degenerative change. Electronically Signed   By: Franchot Gallo M.D.   On: 06/24/2019 11:43   Ct Cervical Spine Wo Contrast  Result Date: 06/24/2019 CLINICAL DATA:  Found on floor. EXAM: CT HEAD WITHOUT CONTRAST CT CERVICAL SPINE WITHOUT CONTRAST TECHNIQUE: Multidetector CT imaging of the head and cervical spine was performed following the standard protocol without intravenous contrast. Multiplanar CT image reconstructions of the cervical spine were also generated. COMPARISON:  None. FINDINGS: CT HEAD FINDINGS Brain: Generalized atrophy. Patchy bilateral white matter disease appears  chronic. No acute cortical infarct. No acute hemorrhage or mass. Vascular: Negative for hyperdense vessel. Atherosclerotic calcification. Skull: Negative Sinuses/Orbits: Mild mucosal edema paranasal sinuses. Bilateral cataract surgery Other: None CT CERVICAL SPINE FINDINGS Alignment: Mild anterolisthesis C5-6.  Mild anterolisthesis T1-2. Skull base and vertebrae: Negative for fracture Soft tissues and spinal canal: Negative for soft tissue mass. Atherosclerotic disease. Disc levels: Extensive multilevel facet degeneration throughout the cervical spine. Disc degeneration and mild spurring at C6 7. No significant spinal stenosis. Upper chest: A pickle scarring bilaterally. Other: None IMPRESSION: 1. No acute intracranial abnormality. Atrophy and mild chronic microvascular ischemic change in the white matter 2. Negative for cervical spine fracture. Multilevel degenerative change. Electronically Signed   By: Franchot Gallo M.D.   On: 06/24/2019 11:43    Procedures Procedures (including critical care time)  Medications Ordered in ED Medications  sodium chloride 0.9 % bolus 1,000 mL (has no administration in time range)  potassium chloride SA (K-DUR) CR tablet 40 mEq (40 mEq Oral Given 06/24/19 1516)     Initial Impression / Assessment and Plan / ED Course  I have reviewed the triage vital signs and the nursing notes.  Pertinent labs & imaging results that were available during my care of the patient were reviewed by me and considered in my medical decision making (see chart for details).        85 year old female with past medical history of hypertension, hyperlipidemia, diabetes presents to ED for possible syncopal episode.  Patient resides at home by herself, states that she was having a bowel movement in the bathroom when all of a sudden she fell and hit herself on the tub.  She is unsure how long she  was down for but family found her on the floor.  She does not know exactly when the incident  occurred.  She denies any known head injury or headache.  Denies chest pain, shortness of breath.  On my exam patient is alert and oriented to everything except for the current year.  No facial asymmetry noted.  Chest x-ray is unremarkable.  CT of the head and cervical spine is unremarkable.  Lab work significant for anion gap of 16, creatinine of 1.15 which is similar to prior, CK slightly elevated at 240, leukocytosis of 13.9.  Urinalysis with proteinuria.  Feel that she will benefit from observation for this possible syncopal episode as she does not have full recollection of what occurred and as she resides by herself, there were no witnesses to this event.  Dr. Erlinda Hong hospitalist to admit.  Final Clinical Impressions(s) / ED Diagnoses   Final diagnoses:  Near syncope    ED Discharge Orders    None       Delia Heady, PA-C 06/24/19 1546    Fredia Sorrow, MD 06/24/19 [phone]

## 2019-06-24 NOTE — ED Provider Notes (Signed)
Medical screening examination/treatment/procedure(s) were conducted as a shared visit with non-physician practitioner(s) and myself.  I personally evaluated the patient during the encounter.      Patient seen by me along with physician assistant.  Patient lives at home by herself.  Patient was found on the floor this a.m. by family.  Patient feels that she fell on the toilet.  Is not sure if she passed out or not.  Nuys any complaints.  She has had some lower extremity weakness but that is been long-term.  Cardiac monitoring here without any arrhythmias.  But based on patient's age and possibility of syncopal event feel patient needs admission for cardiac monitoring.  Labs significant for some mild hyponatremia some mild hypokalemia.  Mild renal insufficiency.  Does have a leukocytosis with a white count of 13.9.  Hemoglobin stable at 14.3.  Patient's chest x-ray without any acute findings head CT without any acute findings's cervical CT without any acute findings.   Patient currently is alert no acute distress.  No significant physical exam findings.    Results for orders placed or performed during the hospital encounter of 29/93/71  Basic metabolic panel  Result Value Ref Range   Sodium 132 (L) 135 - 145 mmol/L   Potassium 3.2 (L) 3.5 - 5.1 mmol/L   Chloride 90 (L) 98 - 111 mmol/L   CO2 26 22 - 32 mmol/L   Glucose, Bld 169 (H) 70 - 99 mg/dL   BUN 18 8 - 23 mg/dL   Creatinine, Ser 1.15 (H) 0.44 - 1.00 mg/dL   Calcium 9.6 8.9 - 10.3 mg/dL   GFR calc non Af Amer 40 (L) >60 mL/min   GFR calc Af Amer 46 (L) >60 mL/min   Anion gap 16 (H) 5 - 15  CBC  Result Value Ref Range   WBC 13.9 (H) 4.0 - 10.5 K/uL   RBC 4.34 3.87 - 5.11 MIL/uL   Hemoglobin 14.3 12.0 - 15.0 g/dL   HCT 41.0 36.0 - 46.0 %   MCV 94.5 80.0 - 100.0 fL   MCH 32.9 26.0 - 34.0 pg   MCHC 34.9 30.0 - 36.0 g/dL   RDW 11.4 (L) 11.5 - 15.5 %   Platelets 245 150 - 400 K/uL   nRBC 0.0 0.0 - 0.2 %  CK  Result Value Ref  Range   Total CK 240 (H) 38 - 234 U/L  CBG monitoring, ED  Result Value Ref Range   Glucose-Capillary 157 (H) 70 - 99 mg/dL   Dg Chest 2 View  Result Date: 06/24/2019 CLINICAL DATA:  Fall weakness in lower extremities EXAM: CHEST - 2 VIEW COMPARISON:  None. FINDINGS: The lungs are clear without focal consolidation, edema, effusion or pneumothorax. There is mild cardiomegaly. There is atherosclerotic calcification the aortic knob with a tortuous descending aorta. Degenerative changes seen in the midthoracic spine. No acute osseous findings. Surgical clips in the upper quadrant. IMPRESSION: No acute cardiopulmonary process. Electronically Signed   By: Prudencio Pair M.D.   On: 06/24/2019 12:09   Ct Head Wo Contrast  Result Date: 06/24/2019 CLINICAL DATA:  Found on floor. EXAM: CT HEAD WITHOUT CONTRAST CT CERVICAL SPINE WITHOUT CONTRAST TECHNIQUE: Multidetector CT imaging of the head and cervical spine was performed following the standard protocol without intravenous contrast. Multiplanar CT image reconstructions of the cervical spine were also generated. COMPARISON:  None. FINDINGS: CT HEAD FINDINGS Brain: Generalized atrophy. Patchy bilateral white matter disease appears chronic. No acute cortical infarct. No acute hemorrhage  or mass. Vascular: Negative for hyperdense vessel. Atherosclerotic calcification. Skull: Negative Sinuses/Orbits: Mild mucosal edema paranasal sinuses. Bilateral cataract surgery Other: None CT CERVICAL SPINE FINDINGS Alignment: Mild anterolisthesis C5-6.  Mild anterolisthesis T1-2. Skull base and vertebrae: Negative for fracture Soft tissues and spinal canal: Negative for soft tissue mass. Atherosclerotic disease. Disc levels: Extensive multilevel facet degeneration throughout the cervical spine. Disc degeneration and mild spurring at C6 7. No significant spinal stenosis. Upper chest: A pickle scarring bilaterally. Other: None IMPRESSION: 1. No acute intracranial abnormality. Atrophy  and mild chronic microvascular ischemic change in the white matter 2. Negative for cervical spine fracture. Multilevel degenerative change. Electronically Signed   By: Franchot Gallo M.D.   On: 06/24/2019 11:43   Ct Cervical Spine Wo Contrast  Result Date: 06/24/2019 CLINICAL DATA:  Found on floor. EXAM: CT HEAD WITHOUT CONTRAST CT CERVICAL SPINE WITHOUT CONTRAST TECHNIQUE: Multidetector CT imaging of the head and cervical spine was performed following the standard protocol without intravenous contrast. Multiplanar CT image reconstructions of the cervical spine were also generated. COMPARISON:  None. FINDINGS: CT HEAD FINDINGS Brain: Generalized atrophy. Patchy bilateral white matter disease appears chronic. No acute cortical infarct. No acute hemorrhage or mass. Vascular: Negative for hyperdense vessel. Atherosclerotic calcification. Skull: Negative Sinuses/Orbits: Mild mucosal edema paranasal sinuses. Bilateral cataract surgery Other: None CT CERVICAL SPINE FINDINGS Alignment: Mild anterolisthesis C5-6.  Mild anterolisthesis T1-2. Skull base and vertebrae: Negative for fracture Soft tissues and spinal canal: Negative for soft tissue mass. Atherosclerotic disease. Disc levels: Extensive multilevel facet degeneration throughout the cervical spine. Disc degeneration and mild spurring at C6 7. No significant spinal stenosis. Upper chest: A pickle scarring bilaterally. Other: None IMPRESSION: 1. No acute intracranial abnormality. Atrophy and mild chronic microvascular ischemic change in the white matter 2. Negative for cervical spine fracture. Multilevel degenerative change. Electronically Signed   By: Franchot Gallo M.D.   On: 06/24/2019 11:43      Fredia Sorrow, MD 06/24/19 [phone]

## 2019-06-24 NOTE — ED Notes (Signed)
IV team attempted IV access but unable to get an IV

## 2019-06-24 NOTE — ED Triage Notes (Signed)
Per EMS pt family verbalizing weakness to lower extremities over 6 months. Pt found on the floor this am by family. Unknown time of fall but remembers event but not specific time. Pt denies complaint.

## 2019-06-25 ENCOUNTER — Other Ambulatory Visit: Payer: Self-pay

## 2019-06-25 DIAGNOSIS — Z7984 Long term (current) use of oral hypoglycemic drugs: Secondary | ICD-10-CM | POA: Diagnosis not present

## 2019-06-25 DIAGNOSIS — E785 Hyperlipidemia, unspecified: Secondary | ICD-10-CM | POA: Diagnosis present

## 2019-06-25 DIAGNOSIS — Z79899 Other long term (current) drug therapy: Secondary | ICD-10-CM | POA: Diagnosis not present

## 2019-06-25 DIAGNOSIS — Z9049 Acquired absence of other specified parts of digestive tract: Secondary | ICD-10-CM | POA: Diagnosis not present

## 2019-06-25 DIAGNOSIS — Z79890 Hormone replacement therapy (postmenopausal): Secondary | ICD-10-CM | POA: Diagnosis not present

## 2019-06-25 DIAGNOSIS — E876 Hypokalemia: Secondary | ICD-10-CM | POA: Diagnosis present

## 2019-06-25 DIAGNOSIS — I129 Hypertensive chronic kidney disease with stage 1 through stage 4 chronic kidney disease, or unspecified chronic kidney disease: Secondary | ICD-10-CM | POA: Diagnosis present

## 2019-06-25 DIAGNOSIS — E119 Type 2 diabetes mellitus without complications: Secondary | ICD-10-CM | POA: Diagnosis not present

## 2019-06-25 DIAGNOSIS — Z8249 Family history of ischemic heart disease and other diseases of the circulatory system: Secondary | ICD-10-CM | POA: Diagnosis not present

## 2019-06-25 DIAGNOSIS — F419 Anxiety disorder, unspecified: Secondary | ICD-10-CM | POA: Diagnosis present

## 2019-06-25 DIAGNOSIS — R55 Syncope and collapse: Secondary | ICD-10-CM | POA: Diagnosis present

## 2019-06-25 DIAGNOSIS — E1122 Type 2 diabetes mellitus with diabetic chronic kidney disease: Secondary | ICD-10-CM | POA: Diagnosis present

## 2019-06-25 DIAGNOSIS — H9190 Unspecified hearing loss, unspecified ear: Secondary | ICD-10-CM | POA: Diagnosis present

## 2019-06-25 DIAGNOSIS — E039 Hypothyroidism, unspecified: Secondary | ICD-10-CM | POA: Diagnosis present

## 2019-06-25 DIAGNOSIS — W1812XA Fall from or off toilet with subsequent striking against object, initial encounter: Secondary | ICD-10-CM | POA: Diagnosis present

## 2019-06-25 DIAGNOSIS — Z833 Family history of diabetes mellitus: Secondary | ICD-10-CM | POA: Diagnosis not present

## 2019-06-25 DIAGNOSIS — Z809 Family history of malignant neoplasm, unspecified: Secondary | ICD-10-CM | POA: Diagnosis not present

## 2019-06-25 DIAGNOSIS — M109 Gout, unspecified: Secondary | ICD-10-CM | POA: Diagnosis present

## 2019-06-25 DIAGNOSIS — Z853 Personal history of malignant neoplasm of breast: Secondary | ICD-10-CM | POA: Diagnosis not present

## 2019-06-25 DIAGNOSIS — H409 Unspecified glaucoma: Secondary | ICD-10-CM | POA: Diagnosis present

## 2019-06-25 DIAGNOSIS — Z20828 Contact with and (suspected) exposure to other viral communicable diseases: Secondary | ICD-10-CM | POA: Diagnosis present

## 2019-06-25 DIAGNOSIS — E860 Dehydration: Secondary | ICD-10-CM | POA: Diagnosis present

## 2019-06-25 DIAGNOSIS — E871 Hypo-osmolality and hyponatremia: Secondary | ICD-10-CM | POA: Diagnosis present

## 2019-06-25 DIAGNOSIS — Z841 Family history of disorders of kidney and ureter: Secondary | ICD-10-CM | POA: Diagnosis not present

## 2019-06-25 DIAGNOSIS — N183 Chronic kidney disease, stage 3 (moderate): Secondary | ICD-10-CM | POA: Diagnosis present

## 2019-06-25 DIAGNOSIS — E1140 Type 2 diabetes mellitus with diabetic neuropathy, unspecified: Secondary | ICD-10-CM | POA: Diagnosis present

## 2019-06-25 LAB — URINE CULTURE: Culture: <10000 — AB

## 2019-06-25 LAB — GLUCOSE, CAPILLARY
Glucose-Capillary: 164 mg/dL — ABNORMAL HIGH (ref 70–99)
Glucose-Capillary: 122 mg/dL — ABNORMAL HIGH (ref 70–99)
Glucose-Capillary: 89 mg/dL (ref 70–99)
Glucose-Capillary: 128 mg/dL — ABNORMAL HIGH (ref 70–99)

## 2019-06-25 LAB — BASIC METABOLIC PANEL
Anion gap: 12 (ref 5–15)
BUN: 13 mg/dL (ref 8–23)
CO2: 26 mmol/L (ref 22–32)
Calcium: 9.2 mg/dL (ref 8.9–10.3)
Chloride: 97 mmol/L — ABNORMAL LOW (ref 98–111)
Creatinine, Ser: 0.94 mg/dL (ref 0.44–1.00)
GFR calc Af Amer: 59 mL/min — ABNORMAL LOW (ref >60)
GFR calc non Af Amer: 51 mL/min — ABNORMAL LOW (ref >60)
Glucose, Bld: 201 mg/dL — ABNORMAL HIGH (ref 70–99)
Potassium: 3.2 mmol/L — ABNORMAL LOW (ref 3.5–5.1)
Sodium: 135 mmol/L (ref 135–145)

## 2019-06-25 LAB — MAGNESIUM: Magnesium: 1.8 mg/dL (ref 1.7–2.4)

## 2019-06-25 MED ORDER — POTASSIUM CHLORIDE 10 MEQ/100ML IV SOLN
10.0000 meq | INTRAVENOUS | Status: AC
  Administered 2019-06-25 (×2): 10 meq via INTRAVENOUS
  Filled 2019-06-25 (×2): qty 100

## 2019-06-25 MED ORDER — HALOPERIDOL 1 MG PO TABS
1.0000 mg | ORAL_TABLET | Freq: Once | ORAL | Status: AC
  Administered 2019-06-25: 1 mg via ORAL
  Filled 2019-06-25: qty 1

## 2019-06-25 NOTE — Progress Notes (Signed)
[facility]  PROGRESS NOTE  [name] NTI:[id number] DOB: 1919-09-03 DOA: 06/24/2019 PCP: [name], MD   Brief HPI:   85 year old female with a history of insulin-dependent diabetes mellitus type 2, hypertension, hypothyroidism was admitted to hospital with syncope.  Patient was found on floor per family.  She does not remember events which led to her fall.   Subjective   This morning patient is confused after she received Ativan for MRI brain last night.   Assessment/Plan:    1. Syncope-unclear etiology, monitor on telemetry.  MRI brain was ordered but patient got confused after receiving Ativan.  She has no focal deficit.  Will discontinue MRI brain at this time.  2. QTC prolongation-QTC was prolonged to 625, potassium was low at 3.2.  Will replace potassium.  Serum magnesium this morning is 1.8.  3. Delirium-patient developed delirium, due to Ativan given for MRI brain.  Will discontinue MRI brain, will avoid benzodiazepines.  Bedside sitter.  4. Hypothyroidism-continue Synthroid.  5. Hypertension-continue irbesartan,  Amlodipine.  6. Diabetes mellitus type 2-CBG well controlled, continue sliding scale insulin with NovoLog.    CBG: Recent Labs  Lab 06/24/19 1245 06/24/19 2213 06/25/19 0740 06/25/19 1152  GLUCAP 157* 136* 164* 122*    CBC: Recent Labs  Lab 06/24/19 1217  WBC 13.9*  HGB 14.3  HCT 41.0  MCV 94.5  PLT 867    Basic Metabolic Panel: Recent Labs  Lab 06/24/19 1217 06/25/19 0913  NA 132* 135  K 3.2* 3.2*  CL 90* 97*  CO2 26 26  GLUCOSE 169* 201*  BUN 18 13  CREATININE 1.15* 0.94  CALCIUM 9.6 9.2  MG  --  1.8     DVT prophylaxis: Lovenox  Code Status: Full code  Family Communication: No family at bedside  Disposition Plan: likely home when medically ready for discharge   Scheduled medications:  . acebutolol  200 mg Oral QHS  . acetaminophen  500 mg Oral QHS  . amLODipine  5 mg Oral BH-q7a  .  atorvastatin  10 mg Oral Daily  . enoxaparin (LOVENOX) injection  40 mg Subcutaneous Q24H  . gabapentin  100 mg Oral QHS  . gabapentin  200 mg Oral Daily  . insulin aspart  0-15 Units Subcutaneous TID WC  . irbesartan  150 mg Oral Daily  . levothyroxine  100 mcg Oral Q0600  . sodium chloride flush  10-40 mL Intracatheter Q12H    Consultants:    Procedures:     Antibiotics:   Anti-infectives (From admission, onward)   None       Objective   Vitals:   06/24/19 2126 06/25/19 0100 06/25/19 0559 06/25/19 1402  BP: (!) 176/75  126/87 119/84  Pulse: 77  86 71  Resp: 20  16 17   Temp: 97.6 F (36.4 C)  98.2 F (36.8 C) (!) 97.5 F (36.4 C)  TempSrc: Oral  Oral Oral  SpO2: 96%  90% 94%  Weight:  76.8 kg    Height:  5\' 7"  (1.702 m)      Intake/Output Summary (Last 24 hours) at 06/25/2019 1436 Last data filed at 06/25/2019 1402 Gross per 24 hour  Intake 891.34 ml  Output 1700 ml  Net -808.66 ml   Filed Weights   06/25/19 0100  Weight: 76.8 kg     Physical Examination:    General: Pleasantly confused  Cardiovascular: S1-S2, regular, no murmur auscultated  Respiratory: Clear to auscultation bilaterally  Abdomen: Abdomen is soft, nontender, no  organomegaly  Extremities: No edema in the lower extremities  Neurologic: Alert, not oriented x3, pleasantly confused, moving all extremities     Data Reviewed: I have personally reviewed following labs and imaging studies   Recent Results (from the past 240 hour(s))  SARS Coronavirus 2 [facility] order, Performed in [facility] hospital lab) Nasopharyngeal Nasopharyngeal Swab     Status: None   Collection Time: 06/24/19  3:57 PM   Specimen: Nasopharyngeal Swab  Result Value Ref Range Status   SARS Coronavirus 2 NEGATIVE NEGATIVE Final    Comment: (NOTE) If result is NEGATIVE SARS-CoV-2 target nucleic acids are NOT DETECTED. The SARS-CoV-2 RNA is generally detectable in upper and lower  respiratory specimens  during the acute phase of infection. The lowest  concentration of SARS-CoV-2 viral copies this assay can detect is 250  copies / mL. A negative result does not preclude SARS-CoV-2 infection  and should not be used as the sole basis for treatment or other  patient management decisions.  A negative result may occur with  improper specimen collection / handling, submission of specimen other  than nasopharyngeal swab, presence of viral mutation(s) within the  areas targeted by this assay, and inadequate number of viral copies  (<250 copies / mL). A negative result must be combined with clinical  observations, patient history, and epidemiological information. If result is POSITIVE SARS-CoV-2 target nucleic acids are DETECTED. The SARS-CoV-2 RNA is generally detectable in upper and lower  respiratory specimens dur ing the acute phase of infection.  Positive  results are indicative of active infection with SARS-CoV-2.  Clinical  correlation with patient history and other diagnostic information is  necessary to determine patient infection status.  Positive results do  not rule out bacterial infection or co-infection with other viruses. If result is PRESUMPTIVE POSTIVE SARS-CoV-2 nucleic acids MAY BE PRESENT.   A presumptive positive result was obtained on the submitted specimen  and confirmed on repeat testing.  While 2019 novel coronavirus  (SARS-CoV-2) nucleic acids may be present in the submitted sample  additional confirmatory testing may be necessary for epidemiological  and / or clinical management purposes  to differentiate between  SARS-CoV-2 and other Sarbecovirus currently known to infect humans.  If clinically indicated additional testing with an alternate test  methodology [phone]) is advised. The SARS-CoV-2 RNA is generally  detectable in upper and lower respiratory sp ecimens during the acute  phase of infection. The expected result is Negative. Fact Sheet for Patients:   StrictlyIdeas.no Fact Sheet for Healthcare Providers: BankingDealers.co.za This test is not yet approved or cleared by the [place] FDA and has been authorized for detection and/or diagnosis of SARS-CoV-2 by FDA under an Emergency Use Authorization (EUA).  This EUA will remain in effect (meaning this test can be used) for the duration of the COVID-19 declaration under Section 564(b)(1) of the Act, 21 U.S.C. section 360bbb-3(b)(1), unless the authorization is terminated or revoked sooner. Performed at [facility], [place] [address] [place], [place] [address]      Liver Function Tests: No results for input(s): AST, ALT, ALKPHOS, BILITOT, PROT, ALBUMIN in the last 168 hours. No results for input(s): LIPASE, AMYLASE in the last 168 hours. No results for input(s): AMMONIA in the last 168 hours.  Cardiac Enzymes: Recent Labs  Lab 06/24/19 1217  CKTOTAL 240*   BNP (last 3 results) No results for input(s): BNP in the last 8760 hours.  ProBNP (last 3 results) No results for input(s): PROBNP in  the last 8760 hours.    Studies: Dg Chest 2 View  Result Date: 06/24/2019 CLINICAL DATA:  Fall weakness in lower extremities EXAM: CHEST - 2 VIEW COMPARISON:  None. FINDINGS: The lungs are clear without focal consolidation, edema, effusion or pneumothorax. There is mild cardiomegaly. There is atherosclerotic calcification the aortic knob with a tortuous descending aorta. Degenerative changes seen in the midthoracic spine. No acute osseous findings. Surgical clips in the upper quadrant. IMPRESSION: No acute cardiopulmonary process. Electronically Signed   By: Prudencio Pair M.D.   On: 06/24/2019 12:09   Ct Head Wo Contrast  Result Date: 06/24/2019 CLINICAL DATA:  Found on floor. EXAM: CT HEAD WITHOUT CONTRAST CT CERVICAL SPINE WITHOUT CONTRAST TECHNIQUE: Multidetector CT imaging of the head and cervical spine was performed following  the standard protocol without intravenous contrast. Multiplanar CT image reconstructions of the cervical spine were also generated. COMPARISON:  None. FINDINGS: CT HEAD FINDINGS Brain: Generalized atrophy. Patchy bilateral white matter disease appears chronic. No acute cortical infarct. No acute hemorrhage or mass. Vascular: Negative for hyperdense vessel. Atherosclerotic calcification. Skull: Negative Sinuses/Orbits: Mild mucosal edema paranasal sinuses. Bilateral cataract surgery Other: None CT CERVICAL SPINE FINDINGS Alignment: Mild anterolisthesis C5-6.  Mild anterolisthesis T1-2. Skull base and vertebrae: Negative for fracture Soft tissues and spinal canal: Negative for soft tissue mass. Atherosclerotic disease. Disc levels: Extensive multilevel facet degeneration throughout the cervical spine. Disc degeneration and mild spurring at C6 7. No significant spinal stenosis. Upper chest: A pickle scarring bilaterally. Other: None IMPRESSION: 1. No acute intracranial abnormality. Atrophy and mild chronic microvascular ischemic change in the white matter 2. Negative for cervical spine fracture. Multilevel degenerative change. Electronically Signed   By: Franchot Gallo M.D.   On: 06/24/2019 11:43   Ct Cervical Spine Wo Contrast  Result Date: 06/24/2019 CLINICAL DATA:  Found on floor. EXAM: CT HEAD WITHOUT CONTRAST CT CERVICAL SPINE WITHOUT CONTRAST TECHNIQUE: Multidetector CT imaging of the head and cervical spine was performed following the standard protocol without intravenous contrast. Multiplanar CT image reconstructions of the cervical spine were also generated. COMPARISON:  None. FINDINGS: CT HEAD FINDINGS Brain: Generalized atrophy. Patchy bilateral white matter disease appears chronic. No acute cortical infarct. No acute hemorrhage or mass. Vascular: Negative for hyperdense vessel. Atherosclerotic calcification. Skull: Negative Sinuses/Orbits: Mild mucosal edema paranasal sinuses. Bilateral cataract surgery  Other: None CT CERVICAL SPINE FINDINGS Alignment: Mild anterolisthesis C5-6.  Mild anterolisthesis T1-2. Skull base and vertebrae: Negative for fracture Soft tissues and spinal canal: Negative for soft tissue mass. Atherosclerotic disease. Disc levels: Extensive multilevel facet degeneration throughout the cervical spine. Disc degeneration and mild spurring at C6 7. No significant spinal stenosis. Upper chest: A pickle scarring bilaterally. Other: None IMPRESSION: 1. No acute intracranial abnormality. Atrophy and mild chronic microvascular ischemic change in the white matter 2. Negative for cervical spine fracture. Multilevel degenerative change. Electronically Signed   By: Franchot Gallo M.D.   On: 06/24/2019 11:43       Time spent: 20 min  [facility] Pager [phone]. If 7PM-7AM, please contact night-coverage at [email or web address], Office  [phone]  password [email or web address]  06/25/2019, 2:36 PM  LOS: 0 days

## 2019-06-25 NOTE — Progress Notes (Addendum)
Pt arrived to unit at shift change. MRI called to report pt had STAT MRI scan that has not been completed. Pt and family member report pt is claustrophobic and has a history of anxiety. Pt requesting to have anti-anxiety medication given before completing MRI. On call provider Maudie Mercury, MD made aware and ordered 0.5 mg of ativan. However after administering medication pt became disoriented and agitated. Pt impulsive picking at lines and attempting to get out of bed. Pt unable to complete MRI. Safety sitter at bedside. MD updated on pt status. Will continue to monitor closely.

## 2019-06-26 LAB — BASIC METABOLIC PANEL
Anion gap: 10 (ref 5–15)
Anion gap: 10 (ref 5–15)
BUN: 14 mg/dL (ref 8–23)
BUN: 18 mg/dL (ref 8–23)
CO2: 23 mmol/L (ref 22–32)
CO2: 22 mmol/L (ref 22–32)
Calcium: 8.2 mg/dL — ABNORMAL LOW (ref 8.9–10.3)
Calcium: 8.1 mg/dL — ABNORMAL LOW (ref 8.9–10.3)
Chloride: 101 mmol/L (ref 98–111)
Chloride: 100 mmol/L (ref 98–111)
Creatinine, Ser: 1.08 mg/dL — ABNORMAL HIGH (ref 0.44–1.00)
Creatinine, Ser: 1.24 mg/dL — ABNORMAL HIGH (ref 0.44–1.00)
GFR calc Af Amer: 50 mL/min — ABNORMAL LOW (ref >60)
GFR calc Af Amer: 42 mL/min — ABNORMAL LOW (ref >60)
GFR calc non Af Amer: 43 mL/min — ABNORMAL LOW (ref >60)
GFR calc non Af Amer: 36 mL/min — ABNORMAL LOW (ref >60)
Glucose, Bld: 130 mg/dL — ABNORMAL HIGH (ref 70–99)
Glucose, Bld: 207 mg/dL — ABNORMAL HIGH (ref 70–99)
Potassium: 2.7 mmol/L — CL (ref 3.5–5.1)
Potassium: 3.6 mmol/L (ref 3.5–5.1)
Sodium: 134 mmol/L — ABNORMAL LOW (ref 135–145)
Sodium: 132 mmol/L — ABNORMAL LOW (ref 135–145)

## 2019-06-26 LAB — CBC
HCT: 35.2 % — ABNORMAL LOW (ref 36.0–46.0)
Hemoglobin: 12.4 g/dL (ref 12.0–15.0)
MCH: 33.2 pg (ref 26.0–34.0)
MCHC: 35.2 g/dL (ref 30.0–36.0)
MCV: 94.4 fL (ref 80.0–100.0)
Platelets: 224 10*3/uL (ref 150–400)
RBC: 3.73 MIL/uL — ABNORMAL LOW (ref 3.87–5.11)
RDW: 11.9 % (ref 11.5–15.5)
WBC: 10.2 10*3/uL (ref 4.0–10.5)
nRBC: 0 % (ref 0.0–0.2)

## 2019-06-26 LAB — GLUCOSE, CAPILLARY
Glucose-Capillary: 150 mg/dL — ABNORMAL HIGH (ref 70–99)
Glucose-Capillary: 193 mg/dL — ABNORMAL HIGH (ref 70–99)

## 2019-06-26 MED ORDER — POTASSIUM CHLORIDE 10 MEQ/100ML IV SOLN
10.0000 meq | INTRAVENOUS | Status: AC
  Administered 2019-06-26 (×2): 10 meq via INTRAVENOUS
  Filled 2019-06-26 (×2): qty 100

## 2019-06-26 MED ORDER — POTASSIUM CHLORIDE CRYS ER 20 MEQ PO TBCR
40.0000 meq | EXTENDED_RELEASE_TABLET | Freq: Once | ORAL | Status: AC
  Administered 2019-06-26: 40 meq via ORAL
  Filled 2019-06-26: qty 2

## 2019-06-26 MED ORDER — SODIUM CHLORIDE 0.9 % IV SOLN
INTRAVENOUS | Status: DC
  Administered 2019-06-26: 950 mL via INTRAVENOUS

## 2019-06-26 MED ORDER — MAGNESIUM SULFATE IN D5W 1-5 GM/100ML-% IV SOLN
1.0000 g | Freq: Once | INTRAVENOUS | Status: AC
  Administered 2019-06-26: 1 g via INTRAVENOUS
  Filled 2019-06-26: qty 100

## 2019-06-26 NOTE — Evaluation (Signed)
Physical Therapy Evaluation Patient Details Name: [name] MRN: [id number] DOB: April 28, 1919 Today's Date: 06/26/2019   History of Present Illness  85 year old female with a history of insulin-dependent diabetes mellitus type 2, hypertension, hypothyroidism was admitted to hospital with syncope.  Patient was found on floor per family.  She does not remember events which led to her fall. CT negative, MRI pending  Clinical Impression  Pt admitted with above diagnosis. Pt currently with functional limitations due to the deficits listed below (see PT Problem List).  Pt very cooperative with PT today, appears back to her baseline cognitively, dtr in agreement. Recommend HHPT, will follow in acute setting  Pt will benefit from skilled PT to increase their independence and safety with mobility to allow discharge to the venue listed below.       Follow Up Recommendations Home health PT;Supervision for mobility/OOB    Equipment Recommendations  None recommended by PT    Recommendations for Other Services       Precautions / Restrictions Precautions Precautions: Fall Restrictions Weight Bearing Restrictions: No      Mobility  Bed Mobility               General bed mobility comments: pt in recliner  Transfers Overall transfer level: Needs assistance Equipment used: Rolling walker (2 wheeled) Transfers: Sit to/from Stand Sit to Stand: Min assist         General transfer comment: assist to rise and stabilize, cues for hand placement  Ambulation/Gait Ambulation/Gait assistance: Min assist Gait Distance (Feet): 80 Feet Assistive device: Rolling walker (2 wheeled) Gait Pattern/deviations: Step-through pattern;Decreased stride length;Wide base of support     General Gait Details: assist to maneuver RW, cues to incr step length, for RW distance from self. pt amb with rollator at baseline. pt incontinent of stool in hallway  Stairs            Wheelchair Mobility     Modified Rankin (Stroke Patients Only)       Balance                                             Pertinent Vitals/Pain Pain Assessment: No/denies pain    Home Living Family/patient expects to be discharged to:: Private residence Living Arrangements: Alone Available Help at Discharge: Family;Available PRN/intermittently(daughter) Type of Home: House       Home Layout: One level Home Equipment: [place] - 2 wheels;Kasandra Knudsen - single point Additional Comments: daughter states  she lives across the street, assists her mom with meds, whatever is needed. she is ususally with her mother ~ half day as that is about all her mother will allow.    Prior Function Level of Independence: Independent with assistive device(s)         Comments: usually uses SPC inside home as well as reports soem furniture walking.     Hand Dominance        Extremity/Trunk Assessment   Upper Extremity Assessment Upper Extremity Assessment: Defer to OT evaluation    Lower Extremity Assessment Lower Extremity Assessment: Overall WFL for tasks assessed       Communication   Communication: HOH  Cognition Arousal/Alertness: Awake/alert Behavior During Therapy: WFL for tasks assessed/performed Overall Cognitive Status: Within Functional Limits for tasks assessed  General Comments      Exercises     Assessment/Plan    PT Assessment Patient needs continued PT services  PT Problem List Decreased strength;Decreased range of motion;Decreased activity tolerance;Decreased mobility;Pain;Decreased knowledge of use of DME       PT Treatment Interventions DME instruction;Gait training;Functional mobility training;Therapeutic activities;Therapeutic exercise;Patient/family education;Balance training    PT Goals (Current goals can be found in the Care Plan section)  Acute Rehab PT Goals Time For Goal Achievement:  07/03/19 Potential to Achieve Goals: Good    Frequency     Barriers to discharge        Co-evaluation               AM-PAC PT "6 Clicks" Mobility  Outcome Measure Help needed turning from your back to your side while in a flat bed without using bedrails?: A Little Help needed moving from lying on your back to sitting on the side of a flat bed without using bedrails?: A Little Help needed moving to and from a bed to a chair (including a wheelchair)?: A Little Help needed standing up from a chair using your arms (e.g., wheelchair or bedside chair)?: A Little Help needed to walk in hospital room?: A Little Help needed climbing 3-5 steps with a railing? : A Lot 6 Click Score: 17    End of Session Equipment Utilized During Treatment: Gait belt Activity Tolerance: Patient tolerated treatment well Patient left: in chair;with chair alarm set;with call bell/phone within reach;with family/visitor present   PT Visit Diagnosis: Difficulty in walking, not elsewhere classified (R26.2)    Time: 4103-0131 PT Time Calculation (min) (ACUTE ONLY): 20 min   Charges:   PT Evaluation $PT Eval Low Complexity: 1 Low          Kenyon Ana, PT  Pager: [phone] Acute Rehab Dept [facility]): [phone]   06/26/2019   [facility] 06/26/2019, 1:18 PM

## 2019-06-26 NOTE — Discharge Summary (Signed)
Physician Discharge Summary  [name] EHO:[id number] DOB: 12-Aug-1919 DOA: 06/24/2019  PCP: Cheryl Maw, MD  Admit date: 06/24/2019 Discharge date: 06/26/2019  Time spent: 35* minutes  Recommendations for Outpatient Follow-up:  1. Follow-up PCP in 2 weeks 2. Patient to discharge  With home home health physical therapy   Discharge Diagnoses:  Active Problems:   Diabetes mellitus type 2, noninsulin dependent ([place])   Syncope   Hyponatremia   Discharge Condition: Stable  Diet recommendation: Heart healthy diet  Filed Weights   06/25/19 0100  Weight: 76.8 kg    History of present illness:  85 year old female with a history of insulin-dependent diabetes mellitus type 2, hypertension, hypothyroidism was admitted to hospital with syncope.  Patient was found on floor per family.  She does not remember events which led to her fall.  Hospital Course:   1. Fall -patient says that she fell as her foot slipped while getting off the commode.  She denies passing out.  MRI brain was ordered but could not be completed as patient got confused after receiving Ativan.  MRI brain has been discontinued.  PT evaluation was done and patient will be discharged home on home health PT.    2. QTC prolongation-QTC was prolonged to 625, potassium was low at 3.2.    Replace potassium, today potassium is 3.6.  Repeat EKG showed QTC of 480.  3. Delirium-patient developed delirium, due to Ativan given for MRI brain.    Resolved, MRI brain was not performed during this hospital stay.  Patient has no focal neurological deficits.    4. Hypothyroidism-continue Synthroid.  5. Hypertension-continue irbesartan,  Amlodipine.  6. Diabetes mellitus type 2-CBG well controlled, continue home medications..   Procedures:    Consultations:    Discharge Exam: Vitals:   06/26/19 0551 06/26/19 1430  BP: (!) 164/64 (!) 154/61  Pulse: 62 62  Resp: 16 20  Temp: 98.1 F (36.7 C) 97.7 F (36.5 C)   SpO2: 93% 94%    General: Appears in no acute distress Cardiovascular: S1-S2, regular Respiratory: Clear to auscultation bilaterally  Discharge Instructions   Discharge Instructions    Diet - low sodium heart healthy   Complete by: As directed    Increase activity slowly   Complete by: As directed      Allergies as of 06/26/2019   No Known Allergies     Medication List    STOP taking these medications   doxycycline 100 MG tablet Commonly known as: VIBRA-TABS     TAKE these medications   acebutolol 200 MG capsule Commonly known as: Sectral Take 1 capsule (200 mg total) by mouth at bedtime.   acetaminophen 500 MG tablet Commonly known as: TYLENOL Take 500 mg by mouth at bedtime.   amLODipine 5 MG tablet Commonly known as: NORVASC Take 1 tablet (5 mg total) by mouth every morning.   atorvastatin 10 MG tablet Commonly known as: LIPITOR Take 1 tablet (10 mg total) by mouth daily.   gabapentin 100 MG capsule Commonly known as: NEURONTIN Take 1-2 capsules (100-200 mg total) by mouth 3 (three) times daily. 200 mg in the am and 100 mg at bedtime What changed:   when to take this  additional instructions   glimepiride 1 MG tablet Commonly known as: Amaryl Take 1 tablet (1 mg total) by mouth every morning.   glucose blood test strip Commonly known as: TrueTrack Test Use as instructed to check blood sugar once a day.  Dx E11.9  hydroxypropyl methylcellulose / hypromellose 2.5 % ophthalmic solution Commonly known as: ISOPTO TEARS / GONIOVISC Place 1 drop into both eyes 3 (three) times daily as needed for dry eyes.   levothyroxine 100 MCG tablet Commonly known as: SYNTHROID Take 1 tablet (100 mcg total) by mouth daily before breakfast.   sitaGLIPtin 50 MG tablet Commonly known as: JANUVIA Take 1 tablet (50 mg total) by mouth every morning.   telmisartan 80 MG tablet Commonly known as: MICARDIS Take 1 tablet (80 mg total) by mouth daily.   TRUEplus  Lancets 33G Misc Use to check blood sugar once a day. Dx E11.9      No Known Allergies Follow-up Information    [facility] Follow up.   Why: Home Health RN, Physical Therapy, Occupational Therapy, Facilities manager. agency will call with initial appointment Contact information: [address]       Cheryl Maw, MD Follow up in 2 week(s).   Specialty: Family Medicine Contact information: [place] [address] [phone]            The results of significant diagnostics from this hospitalization (including imaging, microbiology, ancillary and laboratory) are listed below for reference.    Significant Diagnostic Studies: Dg Chest 2 View  Result Date: 06/24/2019 CLINICAL DATA:  Fall weakness in lower extremities EXAM: CHEST - 2 VIEW COMPARISON:  None. FINDINGS: The lungs are clear without focal consolidation, edema, effusion or pneumothorax. There is mild cardiomegaly. There is atherosclerotic calcification the aortic knob with a tortuous descending aorta. Degenerative changes seen in the midthoracic spine. No acute osseous findings. Surgical clips in the upper quadrant. IMPRESSION: No acute cardiopulmonary process. Electronically Signed   By: Prudencio Pair M.D.   On: 06/24/2019 12:09   Ct Head Wo Contrast  Result Date: 06/24/2019 CLINICAL DATA:  Found on floor. EXAM: CT HEAD WITHOUT CONTRAST CT CERVICAL SPINE WITHOUT CONTRAST TECHNIQUE: Multidetector CT imaging of the head and cervical spine was performed following the standard protocol without intravenous contrast. Multiplanar CT image reconstructions of the cervical spine were also generated. COMPARISON:  None. FINDINGS: CT HEAD FINDINGS Brain: Generalized atrophy. Patchy bilateral white matter disease appears chronic. No acute cortical infarct. No acute hemorrhage or mass. Vascular: Negative for hyperdense vessel. Atherosclerotic  calcification. Skull: Negative Sinuses/Orbits: Mild mucosal edema paranasal sinuses. Bilateral cataract surgery Other: None CT CERVICAL SPINE FINDINGS Alignment: Mild anterolisthesis C5-6.  Mild anterolisthesis T1-2. Skull base and vertebrae: Negative for fracture Soft tissues and spinal canal: Negative for soft tissue mass. Atherosclerotic disease. Disc levels: Extensive multilevel facet degeneration throughout the cervical spine. Disc degeneration and mild spurring at C6 7. No significant spinal stenosis. Upper chest: A pickle scarring bilaterally. Other: None IMPRESSION: 1. No acute intracranial abnormality. Atrophy and mild chronic microvascular ischemic change in the white matter 2. Negative for cervical spine fracture. Multilevel degenerative change. Electronically Signed   By: Franchot Gallo M.D.   On: 06/24/2019 11:43   Ct Cervical Spine Wo Contrast  Result Date: 06/24/2019 CLINICAL DATA:  Found on floor. EXAM: CT HEAD WITHOUT CONTRAST CT CERVICAL SPINE WITHOUT CONTRAST TECHNIQUE: Multidetector CT imaging of the head and cervical spine was performed following the standard protocol without intravenous contrast. Multiplanar CT image reconstructions of the cervical spine were also generated. COMPARISON:  None. FINDINGS: CT HEAD FINDINGS Brain: Generalized atrophy. Patchy bilateral white matter disease appears chronic. No acute cortical infarct. No acute hemorrhage or mass. Vascular: Negative for hyperdense  vessel. Atherosclerotic calcification. Skull: Negative Sinuses/Orbits: Mild mucosal edema paranasal sinuses. Bilateral cataract surgery Other: None CT CERVICAL SPINE FINDINGS Alignment: Mild anterolisthesis C5-6.  Mild anterolisthesis T1-2. Skull base and vertebrae: Negative for fracture Soft tissues and spinal canal: Negative for soft tissue mass. Atherosclerotic disease. Disc levels: Extensive multilevel facet degeneration throughout the cervical spine. Disc degeneration and mild spurring at C6 7. No  significant spinal stenosis. Upper chest: A pickle scarring bilaterally. Other: None IMPRESSION: 1. No acute intracranial abnormality. Atrophy and mild chronic microvascular ischemic change in the white matter 2. Negative for cervical spine fracture. Multilevel degenerative change. Electronically Signed   By: Franchot Gallo M.D.   On: 06/24/2019 11:43    Microbiology: Recent Results (from the past 240 hour(s))  Urine culture     Status: Abnormal   Collection Time: 06/24/19  3:20 PM   Specimen: Urine, Random  Result Value Ref Range Status   Specimen Description   Final    URINE, RANDOM Performed at [facility] [address] [place], [place] [address]    Special Requests   Final    NONE Performed at [facility], [place] [address] [place], [place] [address]    Culture (A)  Final    <10,000 COLONIES/mL INSIGNIFICANT GROWTH Performed at [facility] [address], [place], [place] [address]    Report Status 06/25/2019 FINAL  Final  SARS Coronavirus 2 [facility] order, Performed in [facility] hospital lab) Nasopharyngeal Nasopharyngeal Swab     Status: None   Collection Time: 06/24/19  3:57 PM   Specimen: Nasopharyngeal Swab  Result Value Ref Range Status   SARS Coronavirus 2 NEGATIVE NEGATIVE Final    Comment: (NOTE) If result is NEGATIVE SARS-CoV-2 target nucleic acids are NOT DETECTED. The SARS-CoV-2 RNA is generally detectable in upper and lower  respiratory specimens during the acute phase of infection. The lowest  concentration of SARS-CoV-2 viral copies this assay can detect is 250  copies / mL. A negative result does not preclude SARS-CoV-2 infection  and should not be used as the sole basis for treatment or other  patient management decisions.  A negative result may occur with  improper specimen collection / handling, submission of specimen other  than nasopharyngeal swab, presence of viral mutation(s) within the  areas  targeted by this assay, and inadequate number of viral copies  (<250 copies / mL). A negative result must be combined with clinical  observations, patient history, and epidemiological information. If result is POSITIVE SARS-CoV-2 target nucleic acids are DETECTED. The SARS-CoV-2 RNA is generally detectable in upper and lower  respiratory specimens dur ing the acute phase of infection.  Positive  results are indicative of active infection with SARS-CoV-2.  Clinical  correlation with patient history and other diagnostic information is  necessary to determine patient infection status.  Positive results do  not rule out bacterial infection or co-infection with other viruses. If result is PRESUMPTIVE POSTIVE SARS-CoV-2 nucleic acids MAY BE PRESENT.   A presumptive positive result was obtained on the submitted specimen  and confirmed on repeat testing.  While 2019 novel coronavirus  (SARS-CoV-2) nucleic acids may be present in the submitted sample  additional confirmatory testing may be necessary for epidemiological  and / or clinical management purposes  to differentiate between  SARS-CoV-2 and other Sarbecovirus currently known to infect humans.  If clinically indicated additional testing with an alternate test  methodology [phone]) is advised. The SARS-CoV-2 RNA is generally  detectable in upper and lower respiratory sp ecimens during the acute  phase of infection. The expected result is Negative. Fact Sheet for Patients:  StrictlyIdeas.no Fact Sheet for Healthcare Providers: BankingDealers.co.za This test is not yet approved or cleared by the [place] FDA and has been authorized for detection and/or diagnosis of SARS-CoV-2 by FDA under an Emergency Use Authorization (EUA).  This EUA will remain in effect (meaning this test can be used) for the duration of the COVID-19 declaration under Section 564(b)(1) of the Act, 21 U.S.C. section  360bbb-3(b)(1), unless the authorization is terminated or revoked sooner. Performed at [facility], [place] [address] [place], [place] [address]      Labs: Basic Metabolic Panel: Recent Labs  Lab 06/24/19 1217 06/25/19 0913 06/26/19 0504 06/26/19 1326  NA 132* 135 134* 132*  K 3.2* 3.2* 2.7* 3.6  CL 90* 97* 101 100  CO2 26 26 23 22   GLUCOSE 169* 201* 130* 207*  BUN 18 13 14 18   CREATININE 1.15* 0.94 1.08* 1.24*  CALCIUM 9.6 9.2 8.2* 8.1*  MG  --  1.8  --   --    Liver Function Tests: No results for input(s): AST, ALT, ALKPHOS, BILITOT, PROT, ALBUMIN in the last 168 hours. No results for input(s): LIPASE, AMYLASE in the last 168 hours. No results for input(s): AMMONIA in the last 168 hours. CBC: Recent Labs  Lab 06/24/19 1217 06/26/19 0504  WBC 13.9* 10.2  HGB 14.3 12.4  HCT 41.0 35.2*  MCV 94.5 94.4  PLT 245 224   Cardiac Enzymes: Recent Labs  Lab 06/24/19 1217  CKTOTAL 240*    CBG: Recent Labs  Lab 06/25/19 1152 06/25/19 1647 06/25/19 2207 06/26/19 0744 06/26/19 1209  GLUCAP 122* 89 128* 150* 193*     Signed:  Oswald Hillock MD.  [facility] 06/26/2019, 2:58 PM

## 2019-06-26 NOTE — Progress Notes (Signed)
Pt discharged home today per Dr. Darrick Meigs. Pt's IV site D/C'd and WDL. Pt's VSS. Pt provided with home medication list, discharge instructions and prescriptions. Verbalized understanding. Pt left floor via WC in stable condition accompanied by RN.

## 2019-06-26 NOTE — Progress Notes (Signed)
CRITICAL VALUE ALERT  Critical Value:  Potassium 2.7  Date & Time Notied:  06/26/2019 0545  Provider Notified: Caryl Ada, PA and Dr. Hanley Ben  Orders Received/Actions taken: PO and IV Potassium see MAR

## 2019-06-26 NOTE — Plan of Care (Signed)
  Problem: Clinical Measurements: Goal: Ability to maintain clinical measurements within normal limits will improve Outcome: Progressing Goal: Will remain free from infection Outcome: Progressing Goal: Diagnostic test results will improve Outcome: Progressing   Problem: Nutrition: Goal: Adequate nutrition will be maintained Outcome: Progressing   Problem: Coping: Goal: Level of anxiety will decrease Outcome: Progressing   Problem: Pain Managment: Goal: General experience of comfort will improve Outcome: Progressing   Problem: Safety: Goal: Ability to remain free from injury will improve Outcome: Progressing

## 2019-06-28 ENCOUNTER — Telehealth: Payer: Self-pay | Admitting: Family Medicine

## 2019-06-28 ENCOUNTER — Telehealth: Payer: Self-pay | Admitting: Behavioral Health

## 2019-06-28 DIAGNOSIS — Z9181 History of falling: Secondary | ICD-10-CM | POA: Diagnosis not present

## 2019-06-28 DIAGNOSIS — Z602 Problems related to living alone: Secondary | ICD-10-CM | POA: Diagnosis not present

## 2019-06-28 DIAGNOSIS — H409 Unspecified glaucoma: Secondary | ICD-10-CM | POA: Diagnosis not present

## 2019-06-28 DIAGNOSIS — F419 Anxiety disorder, unspecified: Secondary | ICD-10-CM | POA: Diagnosis not present

## 2019-06-28 DIAGNOSIS — E785 Hyperlipidemia, unspecified: Secondary | ICD-10-CM | POA: Diagnosis not present

## 2019-06-28 DIAGNOSIS — M109 Gout, unspecified: Secondary | ICD-10-CM | POA: Diagnosis not present

## 2019-06-28 DIAGNOSIS — Z87440 Personal history of urinary (tract) infections: Secondary | ICD-10-CM | POA: Diagnosis not present

## 2019-06-28 DIAGNOSIS — Z853 Personal history of malignant neoplasm of breast: Secondary | ICD-10-CM | POA: Diagnosis not present

## 2019-06-28 DIAGNOSIS — Z7984 Long term (current) use of oral hypoglycemic drugs: Secondary | ICD-10-CM | POA: Diagnosis not present

## 2019-06-28 DIAGNOSIS — I10 Essential (primary) hypertension: Secondary | ICD-10-CM | POA: Diagnosis not present

## 2019-06-28 DIAGNOSIS — E1140 Type 2 diabetes mellitus with diabetic neuropathy, unspecified: Secondary | ICD-10-CM | POA: Diagnosis not present

## 2019-06-28 DIAGNOSIS — E039 Hypothyroidism, unspecified: Secondary | ICD-10-CM | POA: Diagnosis not present

## 2019-06-28 DIAGNOSIS — R55 Syncope and collapse: Secondary | ICD-10-CM | POA: Diagnosis not present

## 2019-06-28 DIAGNOSIS — E871 Hypo-osmolality and hyponatremia: Secondary | ICD-10-CM | POA: Diagnosis not present

## 2019-06-28 NOTE — Telephone Encounter (Signed)
TCM/Hospital Follow-up declined at this time. Patient has an appointment scheduled for 07/20/2019 at 10:30 AM with PCP for 6 week follow-up.

## 2019-06-28 NOTE — Telephone Encounter (Signed)
Home Health Verbal Orders - Caller/Agency: Cecile Hearing with [facility] Number: [phone] Requesting OT/PT/Skilled Nursing/Social Work/Speech Therapy: Need verbals for PT,  Frequency: 2 week 2 1 week 1 And 1 every other week 4

## 2019-06-28 NOTE — Telephone Encounter (Signed)
Okay for verbal 

## 2019-06-29 NOTE — Telephone Encounter (Signed)
Okay 

## 2019-06-29 NOTE — Telephone Encounter (Signed)
I called and left a voicemail with the okay for the verbal orders on [name]'s confidential voicemail.

## 2019-07-01 DIAGNOSIS — E1140 Type 2 diabetes mellitus with diabetic neuropathy, unspecified: Secondary | ICD-10-CM | POA: Diagnosis not present

## 2019-07-01 DIAGNOSIS — F419 Anxiety disorder, unspecified: Secondary | ICD-10-CM | POA: Diagnosis not present

## 2019-07-01 DIAGNOSIS — M109 Gout, unspecified: Secondary | ICD-10-CM | POA: Diagnosis not present

## 2019-07-01 DIAGNOSIS — E785 Hyperlipidemia, unspecified: Secondary | ICD-10-CM | POA: Diagnosis not present

## 2019-07-01 DIAGNOSIS — I10 Essential (primary) hypertension: Secondary | ICD-10-CM | POA: Diagnosis not present

## 2019-07-01 DIAGNOSIS — R55 Syncope and collapse: Secondary | ICD-10-CM | POA: Diagnosis not present

## 2019-07-06 DIAGNOSIS — M109 Gout, unspecified: Secondary | ICD-10-CM | POA: Diagnosis not present

## 2019-07-06 DIAGNOSIS — R55 Syncope and collapse: Secondary | ICD-10-CM | POA: Diagnosis not present

## 2019-07-06 DIAGNOSIS — E1140 Type 2 diabetes mellitus with diabetic neuropathy, unspecified: Secondary | ICD-10-CM | POA: Diagnosis not present

## 2019-07-06 DIAGNOSIS — E785 Hyperlipidemia, unspecified: Secondary | ICD-10-CM | POA: Diagnosis not present

## 2019-07-06 DIAGNOSIS — I10 Essential (primary) hypertension: Secondary | ICD-10-CM | POA: Diagnosis not present

## 2019-07-06 DIAGNOSIS — F419 Anxiety disorder, unspecified: Secondary | ICD-10-CM | POA: Diagnosis not present

## 2019-07-09 ENCOUNTER — Telehealth: Payer: Self-pay | Admitting: Family Medicine

## 2019-07-09 DIAGNOSIS — E1140 Type 2 diabetes mellitus with diabetic neuropathy, unspecified: Secondary | ICD-10-CM | POA: Diagnosis not present

## 2019-07-09 DIAGNOSIS — E785 Hyperlipidemia, unspecified: Secondary | ICD-10-CM | POA: Diagnosis not present

## 2019-07-09 DIAGNOSIS — I10 Essential (primary) hypertension: Secondary | ICD-10-CM | POA: Diagnosis not present

## 2019-07-09 DIAGNOSIS — R55 Syncope and collapse: Secondary | ICD-10-CM | POA: Diagnosis not present

## 2019-07-09 DIAGNOSIS — F419 Anxiety disorder, unspecified: Secondary | ICD-10-CM | POA: Diagnosis not present

## 2019-07-09 DIAGNOSIS — M109 Gout, unspecified: Secondary | ICD-10-CM | POA: Diagnosis not present

## 2019-07-09 NOTE — Telephone Encounter (Signed)
[name] with [facility] called in to make provider aware that pt had a fall early yesterday. No injuries. Pt has agreed to use bedside bed comole and daughter is getting pt a life alert. Pt's left leg has a trimmer.      [phone]

## 2019-07-12 ENCOUNTER — Telehealth: Payer: Self-pay | Admitting: Family Medicine

## 2019-07-12 NOTE — Telephone Encounter (Signed)
Verbal left on [name]'s voicemail.

## 2019-07-12 NOTE — Telephone Encounter (Signed)
okay

## 2019-07-12 NOTE — Telephone Encounter (Signed)
Copied from [place] [id number]. Topic: Quick Communication - Home Health Verbal Orders >> Jul 12, 2019 10:11 AM Jodie Echevaria wrote: Caller/Agency: Shelby Dubin / [place] Number: [phone] Ext# [phone] Requesting OT/PT/Skilled Nursing/Social Work/Speech Therapy: Nursing  Frequency: Nurse consultation

## 2019-07-13 ENCOUNTER — Other Ambulatory Visit: Payer: Self-pay

## 2019-07-13 ENCOUNTER — Other Ambulatory Visit (INDEPENDENT_AMBULATORY_CARE_PROVIDER_SITE_OTHER): Payer: Medicare Other

## 2019-07-13 ENCOUNTER — Encounter: Payer: Self-pay | Admitting: Family Medicine

## 2019-07-13 ENCOUNTER — Ambulatory Visit (INDEPENDENT_AMBULATORY_CARE_PROVIDER_SITE_OTHER): Payer: Medicare Other | Admitting: Family Medicine

## 2019-07-13 VITALS — BP 130/80 | HR 68 | Ht 67.0 in

## 2019-07-13 DIAGNOSIS — Z09 Encounter for follow-up examination after completed treatment for conditions other than malignant neoplasm: Secondary | ICD-10-CM | POA: Diagnosis not present

## 2019-07-13 DIAGNOSIS — E119 Type 2 diabetes mellitus without complications: Secondary | ICD-10-CM

## 2019-07-13 DIAGNOSIS — N183 Chronic kidney disease, stage 3 unspecified: Secondary | ICD-10-CM

## 2019-07-13 DIAGNOSIS — E878 Other disorders of electrolyte and fluid balance, not elsewhere classified: Secondary | ICD-10-CM

## 2019-07-13 DIAGNOSIS — E871 Hypo-osmolality and hyponatremia: Secondary | ICD-10-CM

## 2019-07-13 LAB — URINALYSIS, ROUTINE W REFLEX MICROSCOPIC
Bilirubin Urine: NEGATIVE
Hgb urine dipstick: NEGATIVE
Ketones, ur: NEGATIVE
Leukocytes,Ua: NEGATIVE
Nitrite: NEGATIVE
Specific Gravity, Urine: 1.015 (ref 1.000–1.030)
Total Protein, Urine: >=300 — AB
Urine Glucose: NEGATIVE
Urobilinogen, UA: 0.2 (ref 0.0–1.0)
pH: 7 (ref 5.0–8.0)

## 2019-07-13 LAB — BASIC METABOLIC PANEL
BUN: 22 mg/dL (ref 6–23)
CO2: 30 mEq/L (ref 19–32)
Calcium: 9.6 mg/dL (ref 8.4–10.5)
Chloride: 87 mEq/L — ABNORMAL LOW (ref 96–112)
Creatinine, Ser: 1.27 mg/dL — ABNORMAL HIGH (ref 0.40–1.20)
GFR: 38.86 mL/min — ABNORMAL LOW (ref >60.00)
Glucose, Bld: 199 mg/dL — ABNORMAL HIGH (ref 70–99)
Potassium: 3.5 mEq/L (ref 3.5–5.1)
Sodium: 126 mEq/L — ABNORMAL LOW (ref 135–145)

## 2019-07-13 NOTE — Addendum Note (Signed)
Addended by: Jon Billings on: 07/13/2019 04:21 PM   Modules accepted: Orders

## 2019-07-13 NOTE — Progress Notes (Addendum)
Established Patient Office Visit  Subjective:  Patient ID: [name], female    DOB: [birth year]  Age: 85 y.o. MRN: [id number]  CC:  Chief Complaint  Patient presents with  . Follow-up    HPI [name] presents for hospital discharge follow-up for near syncopal episode.  Blood pressure has been stable.  Blood sugars fasting have been mostly in the 150 160 range.  Daughter reports an episode of mental status change with hallucinations a few days ago.  These have since cleared after a good night sleep.  She was seeing people who were not there.  Daughter is using a pillbox to administer all medicines.  She has not been overdosing with her Neurontin.  Continues to feel paresthesias in her lower legs.  She is using a walker.  She sleeps a lot between her bed in her recliner.  She is not particularly active.  Past Medical History:  Diagnosis Date  . Anxiety    loss of child 1 year ago  . Cancer ([place])    right breast  . Diabetes mellitus, type 2 ([place])   . Glaucoma   . Gout   . Hyperlipidemia   . Hypertension    states eccho Dr Mare Ferrari 1 year ago- no record found in [facility] or office records  . Hyperthyroidism     Past Surgical History:  Procedure Laterality Date  . ABDOMINAL HYSTERECTOMY  1995  . BREAST LUMPECTOMY  11/06/2011   Procedure: LUMPECTOMY;  Surgeon: Harl Bowie, MD;  Location: [facility];  Service: General;  Laterality: Right;  . CHOLECYSTECTOMY  1976  . ROTATOR CUFF REPAIR  1999   right    Family History  Problem Relation Age of Onset  . Cancer Mother        bladder  . Kidney disease Mother   . Heart attack Brother   . Stroke Father   . Hypertension Sister   . Hyperlipidemia Daughter   . Diabetes Sister     Social History   Socioeconomic History  . Marital status: Widowed    Spouse name: Not on file  . Number of children: Not on file  . Years of education: Not on file  . Highest education level: Not on file  Occupational History  . Not on  file  Social Needs  . Financial resource strain: Not on file  . Food insecurity    Worry: Not on file    Inability: Not on file  . Transportation needs    Medical: Not on file    Non-medical: Not on file  Tobacco Use  . Smoking status: Never Smoker  . Smokeless tobacco: Never Used  Substance and Sexual Activity  . Alcohol use: No  . Drug use: No  . Sexual activity: Not on file  Lifestyle  . Physical activity    Days per week: Not on file    Minutes per session: Not on file  . Stress: Not on file  Relationships  . Social Herbalist on phone: Not on file    Gets together: Not on file    Attends religious service: Not on file    Active member of club or organization: Not on file    Attends meetings of clubs or organizations: Not on file    Relationship status: Not on file  . Intimate partner violence    Fear of current or ex partner: Not on file    Emotionally abused: Not on file  Physically abused: Not on file    Forced sexual activity: Not on file  Other Topics Concern  . Not on file  Social History Narrative   Daughter helps with pt considerably- she lives across the street from her daughter   Homemaker   Completed high school   2 children, both daughters, one died at age 69   Enjoys spending time outside, yard work.   No pets    Outpatient Medications Prior to Visit  Medication Sig Dispense Refill  . acebutolol (SECTRAL) 200 MG capsule Take 1 capsule (200 mg total) by mouth at bedtime. 90 capsule 2  . acetaminophen (TYLENOL) 500 MG tablet Take 500 mg by mouth at bedtime.    [name] amLODipine (NORVASC) 5 MG tablet Take 1 tablet (5 mg total) by mouth every morning. 90 tablet 1  . atorvastatin (LIPITOR) 10 MG tablet Take 1 tablet (10 mg total) by mouth daily. 90 tablet 1  . gabapentin (NEURONTIN) 100 MG capsule Take 1-2 capsules (100-200 mg total) by mouth 3 (three) times daily. 200 mg in the am and 100 mg at bedtime (Patient taking differently: Take 100-200 mg  by mouth See admin instructions. Take 200 mg by mouth in the morning and 100 mg by mouth at bedtime) 180 capsule 5  . glimepiride (AMARYL) 1 MG tablet Take 1 tablet (1 mg total) by mouth every morning. 30 tablet 11  . glucose blood (TRUETRACK TEST) test strip Use as instructed to check blood sugar once a day.  Dx E11.9 100 each 3  . hydroxypropyl methylcellulose / hypromellose (ISOPTO TEARS / GONIOVISC) 2.5 % ophthalmic solution Place 1 drop into both eyes 3 (three) times daily as needed for dry eyes.    [name] levothyroxine (SYNTHROID) 100 MCG tablet Take 1 tablet (100 mcg total) by mouth daily before breakfast. 90 tablet 1  . sitaGLIPtin (JANUVIA) 50 MG tablet Take 1 tablet (50 mg total) by mouth every morning. 90 tablet 1  . telmisartan (MICARDIS) 80 MG tablet Take 1 tablet (80 mg total) by mouth daily. 90 tablet 1  . TRUEPLUS LANCETS 33G MISC Use to check blood sugar once a day. Dx E11.9 100 each 3   No facility-administered medications prior to visit.     No Known Allergies  ROS Review of Systems  Constitutional: Negative.   HENT: Positive for hearing loss.   Eyes: Negative for photophobia and visual disturbance.  Cardiovascular: Negative.   Gastrointestinal: Negative.   Endocrine: Negative for polyphagia and polyuria.  Genitourinary: Negative for difficulty urinating, dysuria and frequency.  Musculoskeletal: Positive for gait problem. Negative for joint swelling.  Skin: Negative for pallor and rash.  Allergic/Immunologic: Negative for immunocompromised state.  Neurological: Positive for numbness. Negative for weakness.  Hematological: Does not bruise/bleed easily.      Objective:    Physical Exam  Constitutional: She is oriented to person, place, and time. She appears well-developed and well-nourished. No distress.  HENT:  Head: Normocephalic and atraumatic.  Right Ear: External ear normal.  Left Ear: External ear normal.  Mouth/Throat: Oropharynx is clear and moist. No  oropharyngeal exudate.  Eyes: Pupils are equal, round, and reactive to light. Conjunctivae are normal. Right eye exhibits no discharge. Left eye exhibits no discharge. No scleral icterus.  Neck: No JVD present. No tracheal deviation present.  Cardiovascular: Normal rate, regular rhythm and normal heart sounds.  Pulmonary/Chest: Effort normal and breath sounds normal. No stridor.  Abdominal: Bowel sounds are normal.  Musculoskeletal:     Lumbar  back: She exhibits no tenderness and no bony tenderness.  Neurological: She is alert and oriented to person, place, and time.   Patient was able to stand unassisted from a seated position.  Skin: Skin is warm and dry. She is not diaphoretic.  Psychiatric: She has a normal mood and affect. Her behavior is normal.    BP 130/80   Pulse 68   Ht _0  (1.702 m)   SpO2 96%   BMI 26.52 kg/m  Wt Readings from Last 3 Encounters:  06/25/19 169 lb 5 oz (76.8 kg)  05/28/19 178 lb (80.7 kg)  12/08/18 185 lb (83.9 kg)   BP Readings from Last 3 Encounters:  07/13/19 130/80  06/26/19 (!) 154/61  06/08/19 130/76   Guideline developer:  UpToDate (see UpToDate for funding source) Date Released: June 2014  Health Maintenance Due  Topic Date Due  . TETANUS/TDAP  08/21/1940  . DEXA SCAN  08/21/1986  . MAMMOGRAM  11/18/2017  . FOOT EXAM  04/08/2019  . OPHTHALMOLOGY EXAM  05/19/2019  . INFLUENZA VACCINE  06/19/2019    There are no preventive care reminders to display for this patient.  Lab Results  Component Value Date   TSH 3.57 06/08/2019   Lab Results  Component Value Date   WBC 10.2 06/26/2019   HGB 12.4 06/26/2019   HCT 35.2 (L) 06/26/2019   MCV 94.4 06/26/2019   PLT 224 06/26/2019   Lab Results  Component Value Date   NA 126 (L) 07/13/2019   K 3.5 07/13/2019   CHLORIDE 102 01/04/2016   CO2 30 07/13/2019   GLUCOSE 199 (H) 07/13/2019   BUN 22 07/13/2019   CREATININE 1.27 (H) 07/13/2019   BILITOT 0.5 06/08/2019   ALKPHOS 52  06/08/2019   AST 15 06/08/2019   ALT 12 06/08/2019   PROT 6.7 06/08/2019   ALBUMIN 3.9 06/08/2019   CALCIUM 9.6 07/13/2019   ANIONGAP 10 06/26/2019   EGFR 29 (L) 01/04/2016   GFR 38.86 (L) 07/13/2019   Lab Results  Component Value Date   CHOL 172 06/08/2019   Lab Results  Component Value Date   HDL 42.30 06/08/2019   Lab Results  Component Value Date   LDLCALC 93 03/12/2016   Lab Results  Component Value Date   TRIG 287.0 (H) 06/08/2019   Lab Results  Component Value Date   CHOLHDL 4 06/08/2019   Lab Results  Component Value Date   HGBA1C 6.8 (H) 06/08/2019      Assessment & Plan:   Problem List Items Addressed This Visit      Genitourinary   CKD (chronic kidney disease) stage 3, GFR 30-59 ml/min ([place]) - Primary   Relevant Orders   Basic metabolic panel (Completed)     Other   Hospital discharge follow-up   Hyponatremia   Relevant Orders   Arginine vasopressin hormone   Hypochloremia   Relevant Orders   Arginine vasopressin hormone      No orders of the defined types were placed in this encounter.   Follow-up: Return in about 1 month (around 08/13/2019).   Patient is alert and oriented today.  Rechecking a BMP.  Urinalysis was normal.  Daughter will continue to assist with medicines.

## 2019-07-14 ENCOUNTER — Inpatient Hospital Stay: Payer: Medicare Other | Admitting: Family Medicine

## 2019-07-16 DIAGNOSIS — I10 Essential (primary) hypertension: Secondary | ICD-10-CM | POA: Diagnosis not present

## 2019-07-16 DIAGNOSIS — E1140 Type 2 diabetes mellitus with diabetic neuropathy, unspecified: Secondary | ICD-10-CM | POA: Diagnosis not present

## 2019-07-16 DIAGNOSIS — F419 Anxiety disorder, unspecified: Secondary | ICD-10-CM | POA: Diagnosis not present

## 2019-07-16 DIAGNOSIS — M109 Gout, unspecified: Secondary | ICD-10-CM | POA: Diagnosis not present

## 2019-07-16 DIAGNOSIS — E785 Hyperlipidemia, unspecified: Secondary | ICD-10-CM | POA: Diagnosis not present

## 2019-07-16 DIAGNOSIS — R55 Syncope and collapse: Secondary | ICD-10-CM | POA: Diagnosis not present

## 2019-07-19 ENCOUNTER — Other Ambulatory Visit: Payer: Medicare Other

## 2019-07-19 ENCOUNTER — Other Ambulatory Visit: Payer: Self-pay

## 2019-07-19 DIAGNOSIS — E878 Other disorders of electrolyte and fluid balance, not elsewhere classified: Secondary | ICD-10-CM

## 2019-07-19 DIAGNOSIS — E871 Hypo-osmolality and hyponatremia: Secondary | ICD-10-CM

## 2019-07-19 NOTE — Addendum Note (Signed)
Addended by: Rodrigo Ran on: 07/19/2019 01:13 PM   Modules accepted: Orders

## 2019-07-20 ENCOUNTER — Ambulatory Visit: Payer: Medicare Other | Admitting: Family Medicine

## 2019-07-21 DIAGNOSIS — F419 Anxiety disorder, unspecified: Secondary | ICD-10-CM | POA: Diagnosis not present

## 2019-07-21 DIAGNOSIS — I10 Essential (primary) hypertension: Secondary | ICD-10-CM | POA: Diagnosis not present

## 2019-07-21 DIAGNOSIS — E785 Hyperlipidemia, unspecified: Secondary | ICD-10-CM | POA: Diagnosis not present

## 2019-07-21 DIAGNOSIS — E1140 Type 2 diabetes mellitus with diabetic neuropathy, unspecified: Secondary | ICD-10-CM | POA: Diagnosis not present

## 2019-07-21 DIAGNOSIS — R55 Syncope and collapse: Secondary | ICD-10-CM | POA: Diagnosis not present

## 2019-07-21 DIAGNOSIS — M109 Gout, unspecified: Secondary | ICD-10-CM | POA: Diagnosis not present

## 2019-07-21 LAB — SPECIMEN STATUS REPORT

## 2019-07-23 LAB — ARGININE VASOPRESSIN HORMONE: Osmolality Meas: 268 mOsmol/kg — ABNORMAL LOW (ref 280–301)

## 2019-07-24 DIAGNOSIS — E785 Hyperlipidemia, unspecified: Secondary | ICD-10-CM | POA: Diagnosis not present

## 2019-07-24 DIAGNOSIS — I10 Essential (primary) hypertension: Secondary | ICD-10-CM | POA: Diagnosis not present

## 2019-07-24 DIAGNOSIS — E1140 Type 2 diabetes mellitus with diabetic neuropathy, unspecified: Secondary | ICD-10-CM | POA: Diagnosis not present

## 2019-07-24 DIAGNOSIS — F419 Anxiety disorder, unspecified: Secondary | ICD-10-CM | POA: Diagnosis not present

## 2019-07-24 DIAGNOSIS — R55 Syncope and collapse: Secondary | ICD-10-CM | POA: Diagnosis not present

## 2019-07-24 DIAGNOSIS — M109 Gout, unspecified: Secondary | ICD-10-CM | POA: Diagnosis not present

## 2019-07-28 DIAGNOSIS — E1140 Type 2 diabetes mellitus with diabetic neuropathy, unspecified: Secondary | ICD-10-CM | POA: Diagnosis not present

## 2019-07-28 DIAGNOSIS — E039 Hypothyroidism, unspecified: Secondary | ICD-10-CM | POA: Diagnosis not present

## 2019-07-28 DIAGNOSIS — E785 Hyperlipidemia, unspecified: Secondary | ICD-10-CM | POA: Diagnosis not present

## 2019-07-28 DIAGNOSIS — F419 Anxiety disorder, unspecified: Secondary | ICD-10-CM | POA: Diagnosis not present

## 2019-07-28 DIAGNOSIS — Z602 Problems related to living alone: Secondary | ICD-10-CM | POA: Diagnosis not present

## 2019-07-28 DIAGNOSIS — M109 Gout, unspecified: Secondary | ICD-10-CM | POA: Diagnosis not present

## 2019-07-28 DIAGNOSIS — E871 Hypo-osmolality and hyponatremia: Secondary | ICD-10-CM | POA: Diagnosis not present

## 2019-07-28 DIAGNOSIS — R55 Syncope and collapse: Secondary | ICD-10-CM | POA: Diagnosis not present

## 2019-07-28 DIAGNOSIS — Z87440 Personal history of urinary (tract) infections: Secondary | ICD-10-CM | POA: Diagnosis not present

## 2019-07-28 DIAGNOSIS — Z9181 History of falling: Secondary | ICD-10-CM | POA: Diagnosis not present

## 2019-07-28 DIAGNOSIS — Z853 Personal history of malignant neoplasm of breast: Secondary | ICD-10-CM | POA: Diagnosis not present

## 2019-07-28 DIAGNOSIS — Z7984 Long term (current) use of oral hypoglycemic drugs: Secondary | ICD-10-CM | POA: Diagnosis not present

## 2019-07-28 DIAGNOSIS — I10 Essential (primary) hypertension: Secondary | ICD-10-CM | POA: Diagnosis not present

## 2019-07-28 DIAGNOSIS — H409 Unspecified glaucoma: Secondary | ICD-10-CM | POA: Diagnosis not present

## 2019-07-31 DIAGNOSIS — E785 Hyperlipidemia, unspecified: Secondary | ICD-10-CM | POA: Diagnosis not present

## 2019-07-31 DIAGNOSIS — F419 Anxiety disorder, unspecified: Secondary | ICD-10-CM | POA: Diagnosis not present

## 2019-07-31 DIAGNOSIS — R55 Syncope and collapse: Secondary | ICD-10-CM | POA: Diagnosis not present

## 2019-07-31 DIAGNOSIS — I10 Essential (primary) hypertension: Secondary | ICD-10-CM | POA: Diagnosis not present

## 2019-07-31 DIAGNOSIS — E1140 Type 2 diabetes mellitus with diabetic neuropathy, unspecified: Secondary | ICD-10-CM | POA: Diagnosis not present

## 2019-07-31 DIAGNOSIS — M109 Gout, unspecified: Secondary | ICD-10-CM | POA: Diagnosis not present

## 2019-08-02 DIAGNOSIS — E1140 Type 2 diabetes mellitus with diabetic neuropathy, unspecified: Secondary | ICD-10-CM | POA: Diagnosis not present

## 2019-08-02 DIAGNOSIS — I10 Essential (primary) hypertension: Secondary | ICD-10-CM | POA: Diagnosis not present

## 2019-08-02 DIAGNOSIS — M109 Gout, unspecified: Secondary | ICD-10-CM | POA: Diagnosis not present

## 2019-08-02 DIAGNOSIS — R55 Syncope and collapse: Secondary | ICD-10-CM | POA: Diagnosis not present

## 2019-08-02 DIAGNOSIS — F419 Anxiety disorder, unspecified: Secondary | ICD-10-CM | POA: Diagnosis not present

## 2019-08-02 DIAGNOSIS — E785 Hyperlipidemia, unspecified: Secondary | ICD-10-CM | POA: Diagnosis not present

## 2019-08-04 IMAGING — CR DG CHEST 2V
2 series · 2 of 2 positions shown · non-contrast
Comparison: 11/17/2011

CLINICAL DATA: Fever.  Altered mental status.

EXAM:
CHEST - 2 VIEW

[w chest lat]
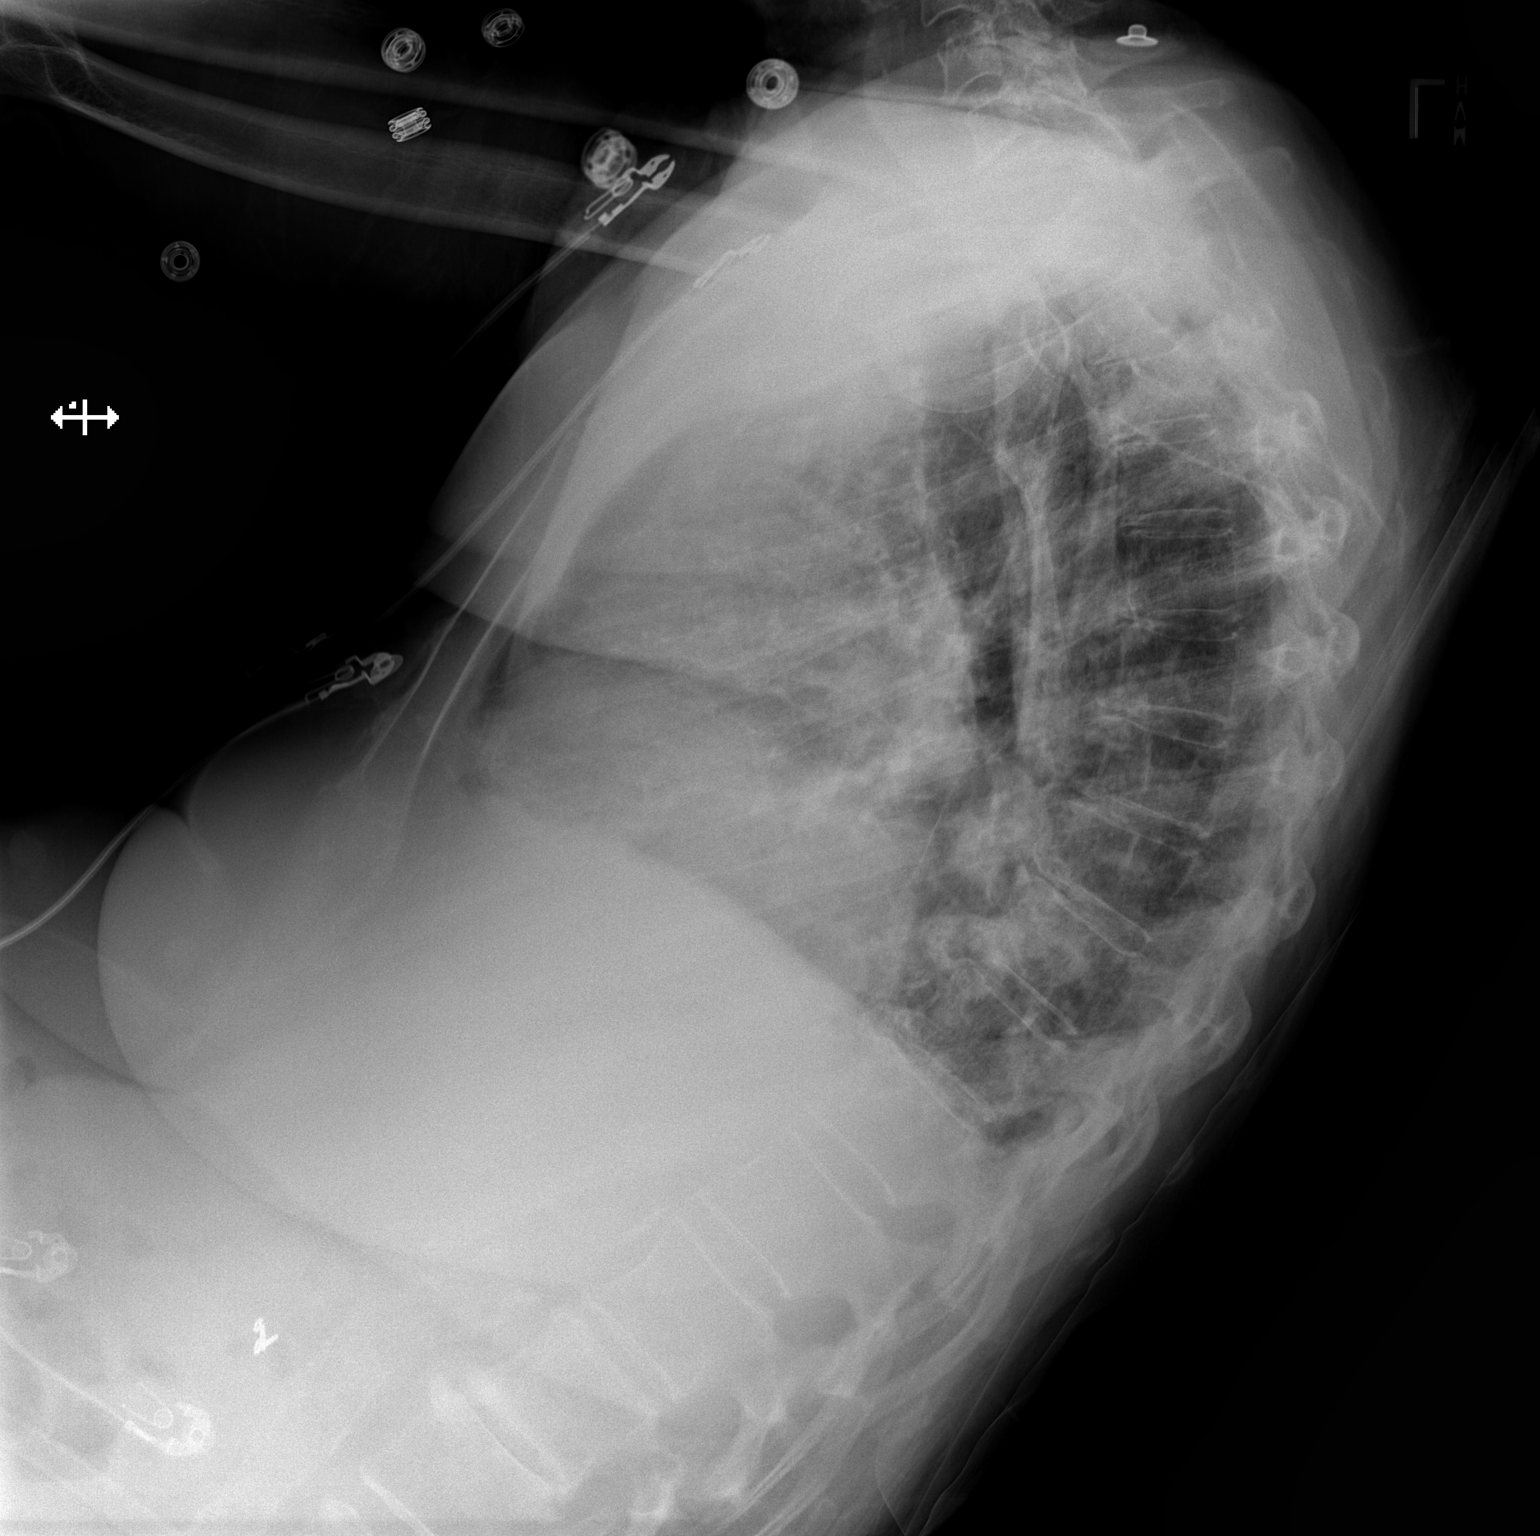

[x chest ap]
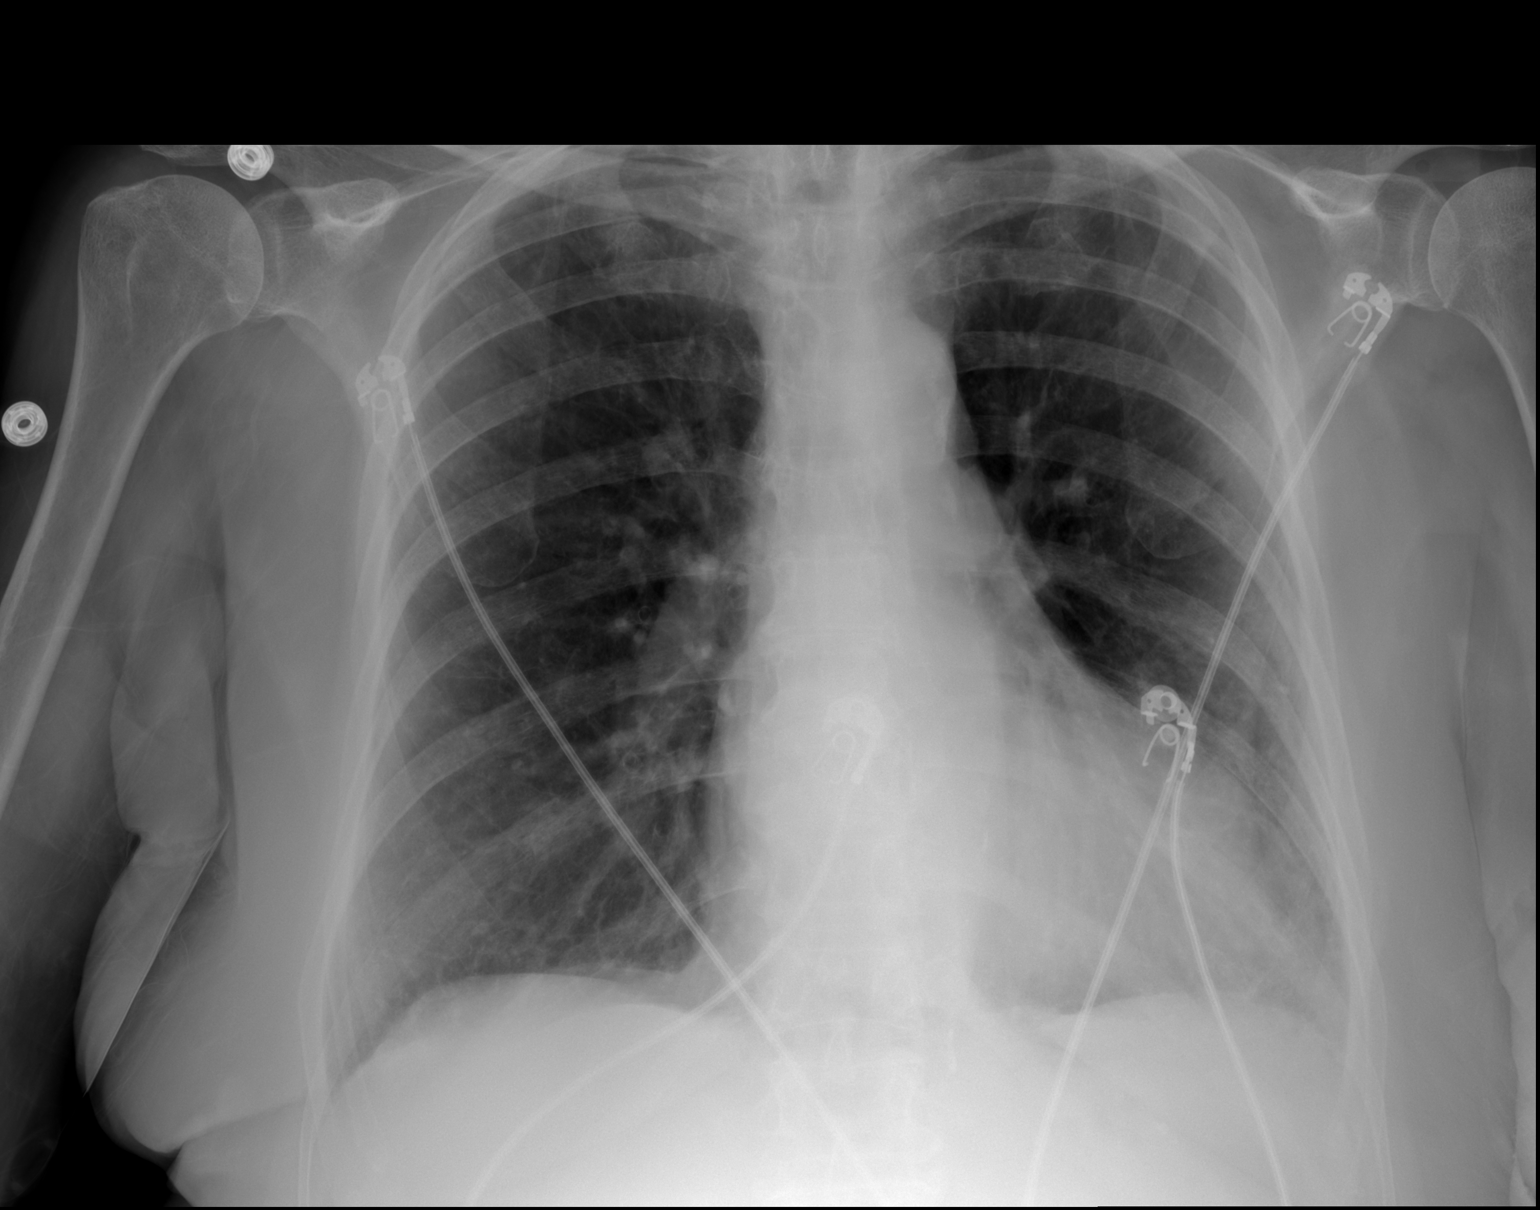

[2 of 2 positions shown; findings below may reference images not displayed]

FINDINGS: Lateral view degraded by patient arm position. Mild hyperinflation.
Midline trachea. Mild cardiomegaly. Atherosclerosis in the
transverse aorta. No pleural effusion or pneumothorax. Mild biapical
pleuroparenchymal scarring. No lobar consolidation. No congestive
failure.
IMPRESSION: No acute cardiopulmonary disease.

Cardiomegaly without congestive failure.

## 2019-08-05 IMAGING — US US ABDOMEN LIMITED
1 series · 14 of 25 positions shown · non-contrast
Comparison: None.

CLINICAL DATA: Abnormal liver function tests

EXAM:
ULTRASOUND ABDOMEN LIMITED RIGHT UPPER QUADRANT

[Series 1: us abdomen limited · 14 of 50 slices shown]
[im 1/50]
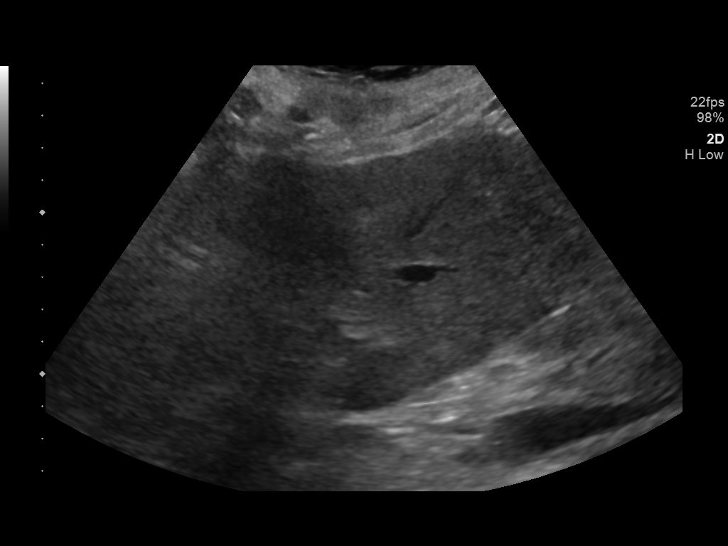
[im 5/50]
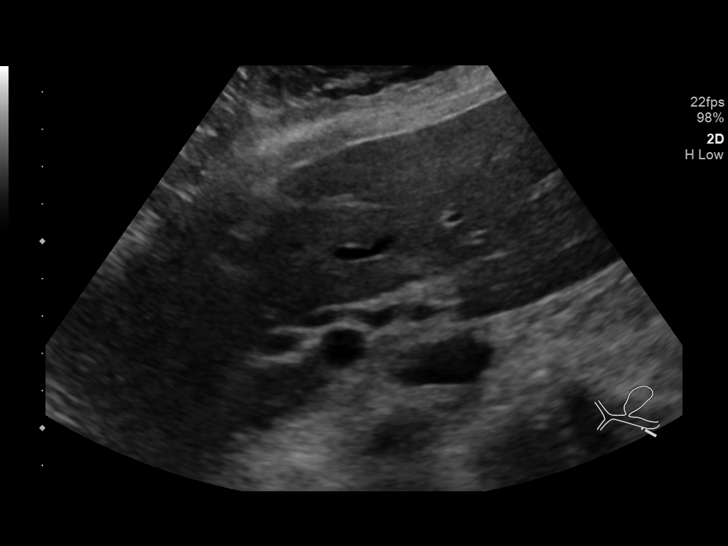
[im 9/50]
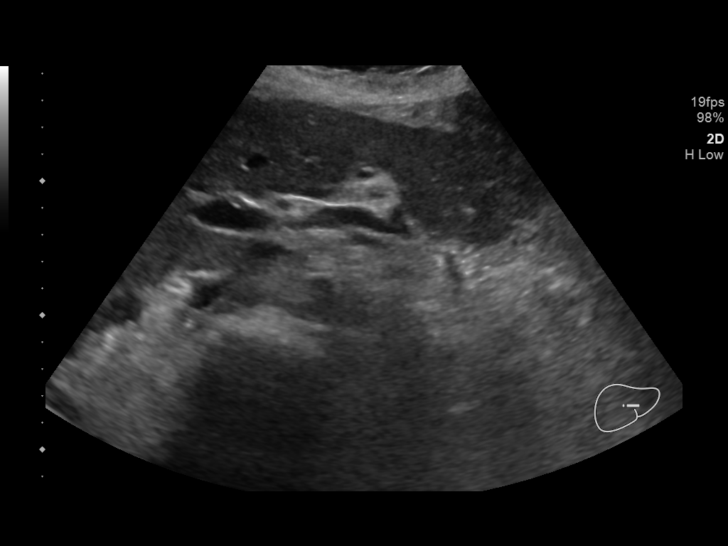
[im 13/50]
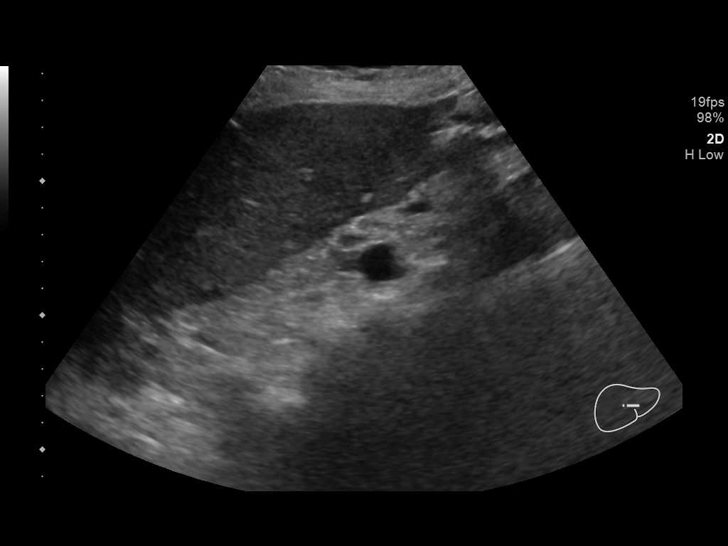
[im 17/50]
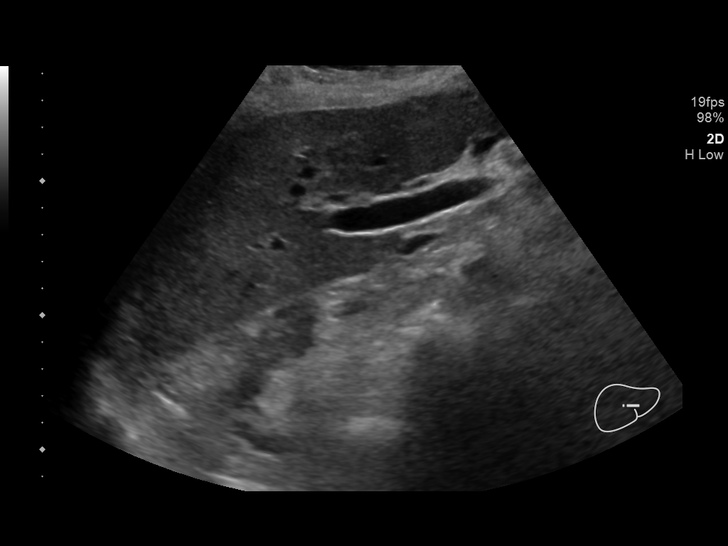
[im 19/50]
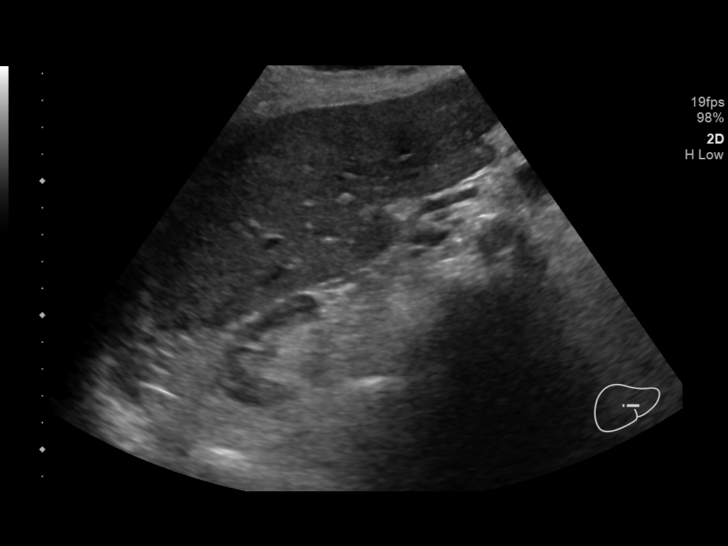
[im 23/50]
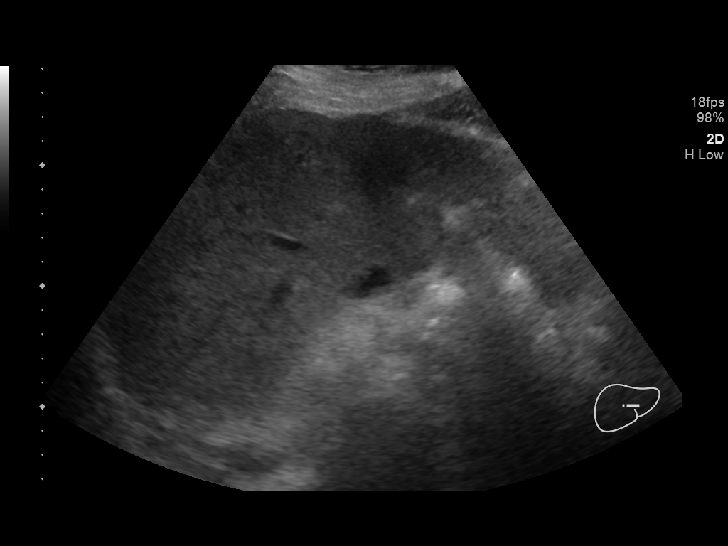
[im 27/50]
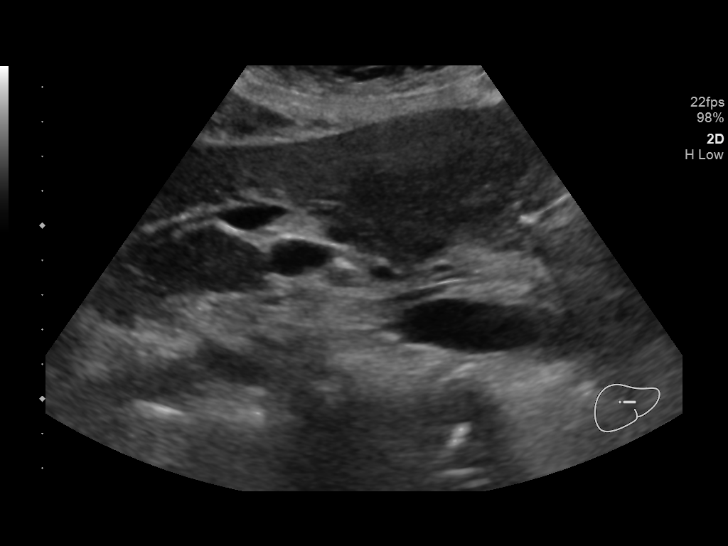
[im 31/50]
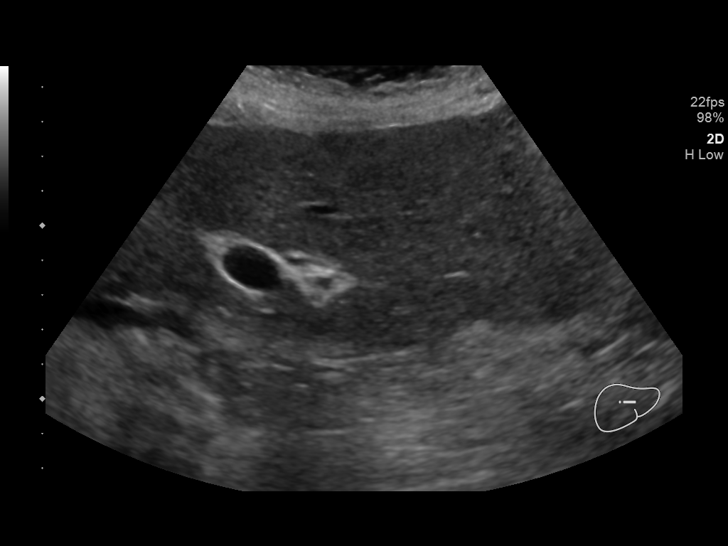
[im 33/50]
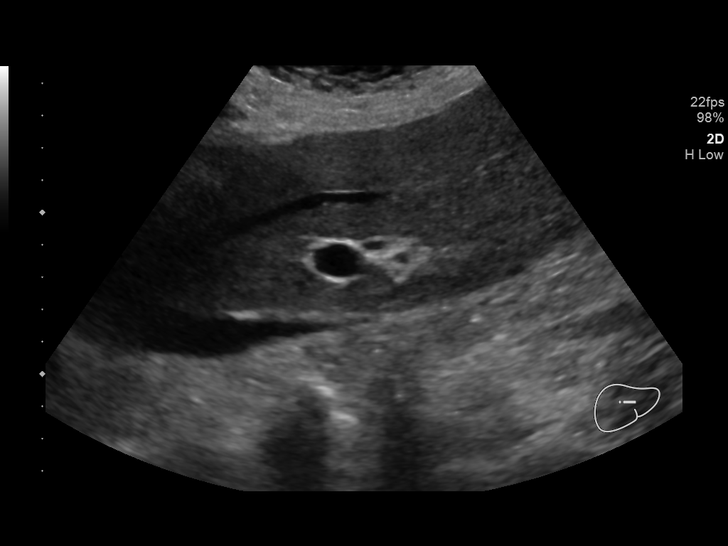
[im 37/50]
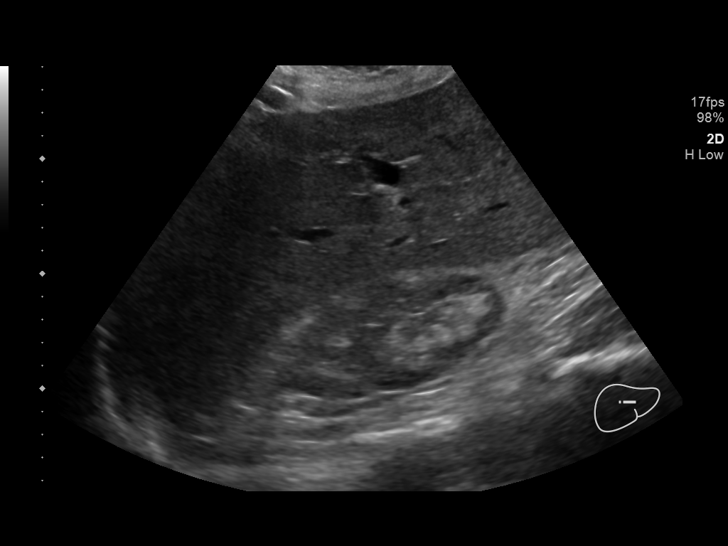
[im 41/50]
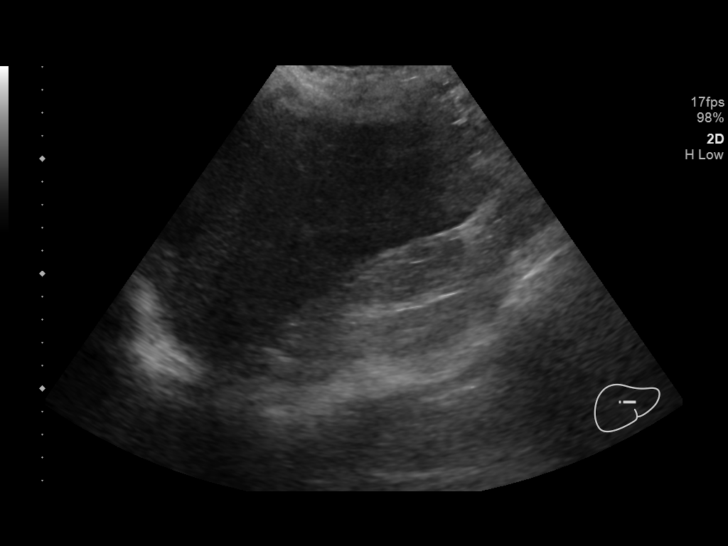
[im 45/50]
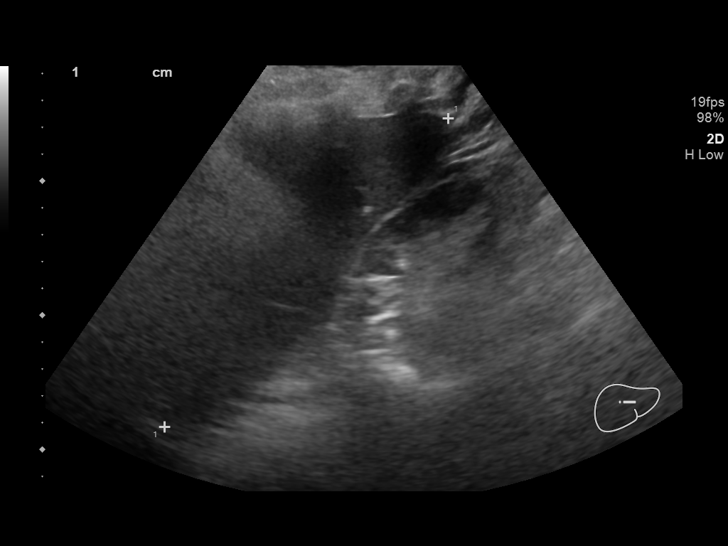
[im 50/50]
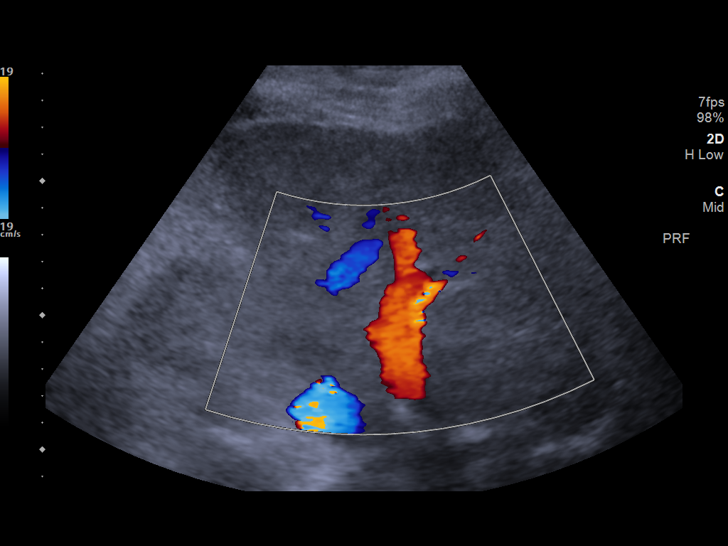

[14 of 25 positions shown; findings below may reference images not displayed]

FINDINGS: Gallbladder:

Surgically absent

Common bile duct:

Diameter: 4 mm.  Where visualized, no filling defect.

Liver:

No focal lesion identified. Within normal limits in parenchymal
echogenicity. Portal vein is patent on color Doppler imaging with
normal direction of blood flow towards the liver.
IMPRESSION: No acute finding or explanation for the history.

Cholecystectomy.

## 2019-08-13 ENCOUNTER — Ambulatory Visit (INDEPENDENT_AMBULATORY_CARE_PROVIDER_SITE_OTHER): Payer: Medicare Other | Admitting: Family Medicine

## 2019-08-13 ENCOUNTER — Encounter: Payer: Self-pay | Admitting: Family Medicine

## 2019-08-13 ENCOUNTER — Other Ambulatory Visit: Payer: Self-pay

## 2019-08-13 VITALS — BP 128/80 | HR 81 | Ht 67.0 in

## 2019-08-13 DIAGNOSIS — Z23 Encounter for immunization: Secondary | ICD-10-CM

## 2019-08-13 DIAGNOSIS — S41101A Unspecified open wound of right upper arm, initial encounter: Secondary | ICD-10-CM | POA: Diagnosis not present

## 2019-08-13 DIAGNOSIS — E871 Hypo-osmolality and hyponatremia: Secondary | ICD-10-CM | POA: Diagnosis not present

## 2019-08-13 DIAGNOSIS — I10 Essential (primary) hypertension: Secondary | ICD-10-CM | POA: Diagnosis not present

## 2019-08-13 DIAGNOSIS — N183 Chronic kidney disease, stage 3 unspecified: Secondary | ICD-10-CM

## 2019-08-13 DIAGNOSIS — I129 Hypertensive chronic kidney disease with stage 1 through stage 4 chronic kidney disease, or unspecified chronic kidney disease: Secondary | ICD-10-CM

## 2019-08-13 NOTE — Progress Notes (Signed)
Established Patient Office Visit  Subjective:  Patient ID: [name], female    DOB: 09-19-1919  Age: 85 y.o. MRN: [id number]  CC:  Chief Complaint  Patient presents with  . Follow-up    HPI [name] presents for follow-up of her blood pressure.  There is been no further hypotension after decreasing the Sectral to 200 mg daily.  There is been no further disorientation.  She is using a walker with seat and hand brakes for gait stabilization.  Fasting blood sugars have been in the less than 160 range.  Longstanding history of a wound on her right posterior arm.  She does not recall an injury.  She has been picking at it.  Her daughter is kept it covered with Neosporin.  Past Medical History:  Diagnosis Date  . Anxiety    loss of child 1 year ago  . Cancer ([place])    right breast  . Diabetes mellitus, type 2 ([place])   . Glaucoma   . Gout   . Hyperlipidemia   . Hypertension    states eccho Dr Mare Ferrari 1 year ago- no record found in [facility] or office records  . Hyperthyroidism     Past Surgical History:  Procedure Laterality Date  . ABDOMINAL HYSTERECTOMY  1995  . BREAST LUMPECTOMY  11/06/2011   Procedure: LUMPECTOMY;  Surgeon: Harl Bowie, MD;  Location: [facility];  Service: General;  Laterality: Right;  . CHOLECYSTECTOMY  1976  . ROTATOR CUFF REPAIR  1999   right    Family History  Problem Relation Age of Onset  . Cancer Mother        bladder  . Kidney disease Mother   . Heart attack Brother   . Stroke Father   . Hypertension Sister   . Hyperlipidemia Daughter   . Diabetes Sister     Social History   Socioeconomic History  . Marital status: Widowed    Spouse name: Not on file  . Number of children: Not on file  . Years of education: Not on file  . Highest education level: Not on file  Occupational History  . Not on file  Social Needs  . Financial resource strain: Not on file  . Food insecurity    Worry: Not on file    Inability: Not on file  .  Transportation needs    Medical: Not on file    Non-medical: Not on file  Tobacco Use  . Smoking status: Never Smoker  . Smokeless tobacco: Never Used  Substance and Sexual Activity  . Alcohol use: No  . Drug use: No  . Sexual activity: Not on file  Lifestyle  . Physical activity    Days per week: Not on file    Minutes per session: Not on file  . Stress: Not on file  Relationships  . Social Herbalist on phone: Not on file    Gets together: Not on file    Attends religious service: Not on file    Active member of club or organization: Not on file    Attends meetings of clubs or organizations: Not on file    Relationship status: Not on file  . Intimate partner violence    Fear of current or ex partner: Not on file    Emotionally abused: Not on file    Physically abused: Not on file    Forced sexual activity: Not on file  Other Topics Concern  . Not  on file  Social History Narrative   Daughter helps with pt considerably- she lives across the street from her daughter   Homemaker   Completed high school   2 children, both daughters, one died at age 46   Enjoys spending time outside, yard work.   No pets    Outpatient Medications Prior to Visit  Medication Sig Dispense Refill  . acebutolol (SECTRAL) 200 MG capsule Take 1 capsule (200 mg total) by mouth at bedtime. 90 capsule 2  . acetaminophen (TYLENOL) 500 MG tablet Take 500 mg by mouth at bedtime.    [name] amLODipine (NORVASC) 5 MG tablet Take 1 tablet (5 mg total) by mouth every morning. 90 tablet 1  . atorvastatin (LIPITOR) 10 MG tablet Take 1 tablet (10 mg total) by mouth daily. 90 tablet 1  . gabapentin (NEURONTIN) 100 MG capsule Take 1-2 capsules (100-200 mg total) by mouth 3 (three) times daily. 200 mg in the am and 100 mg at bedtime (Patient taking differently: Take 100-200 mg by mouth See admin instructions. Take 200 mg by mouth in the morning and 100 mg by mouth at bedtime) 180 capsule 5  . glimepiride  (AMARYL) 1 MG tablet Take 1 tablet (1 mg total) by mouth every morning. 30 tablet 11  . glucose blood (TRUETRACK TEST) test strip Use as instructed to check blood sugar once a day.  Dx E11.9 100 each 3  . hydroxypropyl methylcellulose / hypromellose (ISOPTO TEARS / GONIOVISC) 2.5 % ophthalmic solution Place 1 drop into both eyes 3 (three) times daily as needed for dry eyes.    [name] levothyroxine (SYNTHROID) 100 MCG tablet Take 1 tablet (100 mcg total) by mouth daily before breakfast. 90 tablet 1  . sitaGLIPtin (JANUVIA) 50 MG tablet Take 1 tablet (50 mg total) by mouth every morning. 90 tablet 1  . telmisartan (MICARDIS) 80 MG tablet Take 1 tablet (80 mg total) by mouth daily. 90 tablet 1  . TRUEPLUS LANCETS 33G MISC Use to check blood sugar once a day. Dx E11.9 100 each 3   No facility-administered medications prior to visit.     No Known Allergies  ROS Review of Systems  Constitutional: Negative.   HENT: Negative.   Respiratory: Negative.   Cardiovascular: Negative.   Gastrointestinal: Negative.   Musculoskeletal: Positive for gait problem.  Skin: Positive for wound.  Psychiatric/Behavioral: Negative for agitation, behavioral problems and confusion.      Objective:    Physical Exam  Constitutional: She is oriented to person, place, and time. She appears well-developed and well-nourished. No distress.  HENT:  Head: Normocephalic and atraumatic.  Right Ear: External ear normal.  Left Ear: External ear normal.  Eyes: Conjunctivae are normal. Right eye exhibits no discharge. Left eye exhibits no discharge. No scleral icterus.  Neck: No JVD present. No tracheal deviation present.  Cardiovascular: Normal rate, regular rhythm and normal heart sounds.  Pulmonary/Chest: Effort normal and breath sounds normal. No stridor.  Neurological: She is alert and oriented to person, place, and time.  Skin: Skin is warm and dry. She is not diaphoretic.     Psychiatric: She has a normal mood and  affect. Her behavior is normal.    BP 128/80   Pulse 81   Ht 5' 7"  (1.702 m)   SpO2 97%   BMI 26.52 kg/m  Wt Readings from Last 3 Encounters:  06/25/19 169 lb 5 oz (76.8 kg)  05/28/19 178 lb (80.7 kg)  12/08/18 185 lb (83.9 kg)  BP Readings from Last 3 Encounters:  08/13/19 128/80  07/13/19 130/80  06/26/19 (!) 154/61   Guideline developer:  UpToDate (see UpToDate for funding source) Date Released: June 2014  Health Maintenance Due  Topic Date Due  . TETANUS/TDAP  08/21/1940  . DEXA SCAN  08/21/1986  . MAMMOGRAM  11/18/2017  . FOOT EXAM  04/08/2019  . OPHTHALMOLOGY EXAM  05/19/2019    There are no preventive care reminders to display for this patient.  Lab Results  Component Value Date   TSH 3.57 06/08/2019   Lab Results  Component Value Date   WBC 10.2 06/26/2019   HGB 12.4 06/26/2019   HCT 35.2 (L) 06/26/2019   MCV 94.4 06/26/2019   PLT 224 06/26/2019   Lab Results  Component Value Date   NA 126 (L) 07/13/2019   K 3.5 07/13/2019   CHLORIDE 102 01/04/2016   CO2 30 07/13/2019   GLUCOSE 199 (H) 07/13/2019   BUN 22 07/13/2019   CREATININE 1.27 (H) 07/13/2019   BILITOT 0.5 06/08/2019   ALKPHOS 52 06/08/2019   AST 15 06/08/2019   ALT 12 06/08/2019   PROT 6.7 06/08/2019   ALBUMIN 3.9 06/08/2019   CALCIUM 9.6 07/13/2019   ANIONGAP 10 06/26/2019   EGFR 29 (L) 01/04/2016   GFR 38.86 (L) 07/13/2019   Lab Results  Component Value Date   CHOL 172 06/08/2019   Lab Results  Component Value Date   HDL 42.30 06/08/2019   Lab Results  Component Value Date   LDLCALC 93 03/12/2016   Lab Results  Component Value Date   TRIG 287.0 (H) 06/08/2019   Lab Results  Component Value Date   CHOLHDL 4 06/08/2019   Lab Results  Component Value Date   HGBA1C 6.8 (H) 06/08/2019      Assessment & Plan:   Problem List Items Addressed This Visit      Cardiovascular and Mediastinum   Essential hypertension   Relevant Orders   Basic metabolic panel      Genitourinary   CKD (chronic kidney disease) stage 3, GFR 30-59 ml/min ([facility])   Relevant Orders   Basic metabolic panel     Other   Hyponatremia   Relevant Orders   Basic metabolic panel   Open wound of arm with complication, right, initial encounter   Relevant Orders   Ambulatory referral to Dermatology   Need for influenza vaccination - Primary   Relevant Orders   Flu Vaccine QUAD High Dose(Fluad) (Completed)      No orders of the defined types were placed in this encounter.   Follow-up: Return Recommended follow-up will pending lab results.[name]

## 2019-08-14 LAB — BASIC METABOLIC PANEL
BUN/Creatinine Ratio: 14 (ref 12–28)
BUN: 18 mg/dL (ref 10–36)
CO2: 26 mmol/L (ref 20–29)
Calcium: 9.6 mg/dL (ref 8.7–10.3)
Chloride: 98 mmol/L (ref 96–106)
Creatinine, Ser: 1.25 mg/dL — ABNORMAL HIGH (ref 0.57–1.00)
GFR calc Af Amer: 42 mL/min/{1.73_m2} — ABNORMAL LOW (ref >59)
GFR calc non Af Amer: 36 mL/min/{1.73_m2} — ABNORMAL LOW (ref >59)
Glucose: 165 mg/dL — ABNORMAL HIGH (ref 65–99)
Potassium: 4.2 mmol/L (ref 3.5–5.2)
Sodium: 139 mmol/L (ref 134–144)

## 2019-08-18 DIAGNOSIS — M109 Gout, unspecified: Secondary | ICD-10-CM | POA: Diagnosis not present

## 2019-08-18 DIAGNOSIS — E785 Hyperlipidemia, unspecified: Secondary | ICD-10-CM | POA: Diagnosis not present

## 2019-08-18 DIAGNOSIS — I10 Essential (primary) hypertension: Secondary | ICD-10-CM | POA: Diagnosis not present

## 2019-08-18 DIAGNOSIS — E1140 Type 2 diabetes mellitus with diabetic neuropathy, unspecified: Secondary | ICD-10-CM | POA: Diagnosis not present

## 2019-08-18 DIAGNOSIS — F419 Anxiety disorder, unspecified: Secondary | ICD-10-CM | POA: Diagnosis not present

## 2019-08-18 DIAGNOSIS — R55 Syncope and collapse: Secondary | ICD-10-CM | POA: Diagnosis not present

## 2019-08-24 ENCOUNTER — Ambulatory Visit: Payer: Medicare Other | Admitting: Podiatry

## 2019-08-31 DIAGNOSIS — L821 Other seborrheic keratosis: Secondary | ICD-10-CM | POA: Diagnosis not present

## 2019-08-31 DIAGNOSIS — C44319 Basal cell carcinoma of skin of other parts of face: Secondary | ICD-10-CM | POA: Diagnosis not present

## 2019-08-31 DIAGNOSIS — L570 Actinic keratosis: Secondary | ICD-10-CM | POA: Diagnosis not present

## 2019-08-31 DIAGNOSIS — Z85828 Personal history of other malignant neoplasm of skin: Secondary | ICD-10-CM | POA: Diagnosis not present

## 2019-08-31 DIAGNOSIS — C44612 Basal cell carcinoma of skin of right upper limb, including shoulder: Secondary | ICD-10-CM | POA: Diagnosis not present

## 2019-08-31 DIAGNOSIS — D225 Melanocytic nevi of trunk: Secondary | ICD-10-CM | POA: Diagnosis not present

## 2019-08-31 DIAGNOSIS — D485 Neoplasm of uncertain behavior of skin: Secondary | ICD-10-CM | POA: Diagnosis not present

## 2019-09-08 DIAGNOSIS — H04123 Dry eye syndrome of bilateral lacrimal glands: Secondary | ICD-10-CM | POA: Diagnosis not present

## 2019-09-13 ENCOUNTER — Other Ambulatory Visit: Payer: Self-pay | Admitting: Family Medicine

## 2019-09-13 DIAGNOSIS — G6289 Other specified polyneuropathies: Secondary | ICD-10-CM

## 2019-09-13 MED ORDER — GABAPENTIN 100 MG PO CAPS: ORAL_CAPSULE | ORAL | 0 refills | Status: DC

## 2019-10-20 ENCOUNTER — Other Ambulatory Visit: Payer: Self-pay

## 2019-10-20 DIAGNOSIS — E119 Type 2 diabetes mellitus without complications: Secondary | ICD-10-CM

## 2019-10-20 DIAGNOSIS — G6289 Other specified polyneuropathies: Secondary | ICD-10-CM

## 2019-10-20 DIAGNOSIS — I10 Essential (primary) hypertension: Secondary | ICD-10-CM

## 2019-10-20 DIAGNOSIS — E785 Hyperlipidemia, unspecified: Secondary | ICD-10-CM

## 2019-10-20 DIAGNOSIS — N183 Chronic kidney disease, stage 3 unspecified: Secondary | ICD-10-CM

## 2019-10-20 DIAGNOSIS — E039 Hypothyroidism, unspecified: Secondary | ICD-10-CM

## 2019-10-20 MED ORDER — TELMISARTAN 80 MG PO TABS: 80.0000 mg | ORAL_TABLET | Freq: Every day | ORAL | 2 refills | Status: DC

## 2019-10-20 MED ORDER — GABAPENTIN 100 MG PO CAPS: ORAL_CAPSULE | ORAL | 2 refills | Status: DC

## 2019-10-20 MED ORDER — GLIMEPIRIDE 1 MG PO TABS: 1.0000 mg | ORAL_TABLET | ORAL | 2 refills | Status: DC

## 2019-10-20 MED ORDER — ATORVASTATIN CALCIUM 10 MG PO TABS: 10.0000 mg | ORAL_TABLET | Freq: Every day | ORAL | 2 refills | Status: DC

## 2019-10-20 MED ORDER — SITAGLIPTIN PHOSPHATE 50 MG PO TABS: 50.0000 mg | ORAL_TABLET | ORAL | 2 refills | Status: DC

## 2019-10-20 MED ORDER — ACEBUTOLOL HCL 200 MG PO CAPS: 200.0000 mg | ORAL_CAPSULE | Freq: Every day | ORAL | 2 refills | Status: DC

## 2019-10-20 MED ORDER — LEVOTHYROXINE SODIUM 100 MCG PO TABS: 100.0000 ug | ORAL_TABLET | Freq: Every day | ORAL | 2 refills | Status: DC

## 2019-10-20 MED ORDER — AMLODIPINE BESYLATE 5 MG PO TABS: 5.0000 mg | ORAL_TABLET | ORAL | 2 refills | Status: DC

## 2019-10-27 ENCOUNTER — Ambulatory Visit (INDEPENDENT_AMBULATORY_CARE_PROVIDER_SITE_OTHER): Payer: Medicare Other | Admitting: Podiatry

## 2019-10-27 ENCOUNTER — Other Ambulatory Visit: Payer: Self-pay

## 2019-10-27 ENCOUNTER — Encounter: Payer: Self-pay | Admitting: Podiatry

## 2019-10-27 DIAGNOSIS — M79674 Pain in right toe(s): Secondary | ICD-10-CM | POA: Diagnosis not present

## 2019-10-27 DIAGNOSIS — B351 Tinea unguium: Secondary | ICD-10-CM | POA: Diagnosis not present

## 2019-10-27 DIAGNOSIS — M79675 Pain in left toe(s): Secondary | ICD-10-CM | POA: Diagnosis not present

## 2019-10-27 DIAGNOSIS — E1142 Type 2 diabetes mellitus with diabetic polyneuropathy: Secondary | ICD-10-CM

## 2019-10-27 NOTE — Progress Notes (Signed)
Complaint:  Visit Type: Patient returns to my office for continued preventative foot care services. Complaint: Patient states" my nails have grown long and thick and become painful to walk and wear shoes" Patient has been diagnosed with DM with no foot complications. The patient presents for preventative foot care services. No changes to ROS  Podiatric Exam: Vascular: dorsalis pedis and posterior tibial pulses are palpable bilateral. Capillary return is immediate. Temperature gradient is WNL. Skin turgor WNL  Sensorium: Diminished  Semmes Weinstein monofilament test. Normal tactile sensation bilaterally. Nail Exam: Pt has thick disfigured discolored nails with subungual debris noted bilateral entire nail hallux through fifth toenails Ulcer Exam: There is no evidence of ulcer or pre-ulcerative changes or infection. Orthopedic Exam: Muscle tone and strength are WNL. No limitations in general ROM. No crepitus or effusions noted. Hammer toes  B/l Bony prominences are unremarkable. Skin: No Porokeratosis. No infection or ulcers  Diagnosis:  Onychomycosis, , Pain in right toe, pain in left toes  Treatment & Plan Procedures and Treatment: Consent by patient was obtained for treatment procedures.   Debridement of mycotic and hypertrophic toenails, 1 through 5 bilateral and clearing of subungual debris. No ulceration, no infection noted.  Return Visit-Office Procedure: Patient instructed to return to the office for a follow up visit 4 months for continued evaluation and treatment.    Thailan Sava DPM 

## 2019-11-15 ENCOUNTER — Ambulatory Visit: Payer: Medicare Other | Admitting: Family Medicine

## 2019-11-24 ENCOUNTER — Ambulatory Visit: Payer: Medicare Other | Admitting: Family Medicine

## 2019-11-25 ENCOUNTER — Telehealth: Payer: Self-pay

## 2019-11-25 NOTE — Telephone Encounter (Signed)
Dr. Ethelene Hal, message below is from [facility] daughter of [name]. Please advise    I am trying to get an order for health care I suppose.  I need to find out if [employer] will cover any expense with [facility].  I desparately need to have someone stay with [name] for 2 nights a week if I can get it approved thru [facility].  So I guess I need Dr. Ethelene Hal to order this service thru [employer] to get it all started. At night she does not recognize some family members and asks the same questions over and over 10 minutes apart.  It has something to do with the darkness at night! she is a different person.  I have already spoken with two ladies at [facility]. They have come to [place] and visited with [place] and got the info as to what we need. Thanks for any help you can get started for [place]. Thanks, [name]

## 2019-11-26 ENCOUNTER — Other Ambulatory Visit: Payer: Self-pay

## 2019-11-26 DIAGNOSIS — F0391 Unspecified dementia with behavioral disturbance: Secondary | ICD-10-CM

## 2019-11-26 NOTE — Progress Notes (Signed)
Per Dr.Kremer gave authorization for referral for in home health per daughter [name]) request. Patient needing visit with Dr. Ethelene Hal called daughter to inform her of referral and to schedule virtual visit no answer LMTCB.     This note is not being shared with the patient for the following reason: To respect privacy (The patient or proxy has requested that the information not be shared).

## 2019-11-26 NOTE — Telephone Encounter (Signed)
Spoke with patients daughter who needs home care for the patient. And would also like to know if she could have "yellow (DNR) form" filled out. Referral sent for home care tried calling [phone] [name] (daughter) to see if the patient currently has a power of attorney, if so is it [name]?  Also need to inform her that the referral was sent, no answer LMTCB

## 2019-11-29 ENCOUNTER — Telehealth: Payer: Self-pay | Admitting: Family Medicine

## 2019-11-29 NOTE — Telephone Encounter (Signed)
Patient daughter is calling about home care, patient stated that she may have made things more complicated than what it was, patient daughter is requesting to speak to Dr. Ethelene Hal. Patient daughter [name] contact number is [phone].

## 2019-11-30 NOTE — Telephone Encounter (Signed)
Pt has appointment on 12/01/2019 tried calling no answer.

## 2019-12-01 ENCOUNTER — Ambulatory Visit (INDEPENDENT_AMBULATORY_CARE_PROVIDER_SITE_OTHER): Payer: Medicare Other | Admitting: Family Medicine

## 2019-12-01 ENCOUNTER — Encounter: Payer: Self-pay | Admitting: Family Medicine

## 2019-12-01 VITALS — BP 143/66

## 2019-12-01 DIAGNOSIS — F0391 Unspecified dementia with behavioral disturbance: Secondary | ICD-10-CM | POA: Diagnosis not present

## 2019-12-01 DIAGNOSIS — F03918 Unspecified dementia, unspecified severity, with other behavioral disturbance: Secondary | ICD-10-CM | POA: Insufficient documentation

## 2019-12-01 DIAGNOSIS — F05 Delirium due to known physiological condition: Secondary | ICD-10-CM | POA: Insufficient documentation

## 2019-12-01 MED ORDER — HALOPERIDOL 0.5 MG PO TABS: ORAL_TABLET | ORAL | 0 refills | Status: DC

## 2019-12-01 NOTE — Telephone Encounter (Signed)
Pt had appointment with Dr. Ethelene Hal 12/01/19

## 2019-12-01 NOTE — Progress Notes (Signed)
Established Patient Office Visit  Subjective:  Patient ID: [name], female    DOB: [birth year]  Age: 86 y.o. MRN: [id number]  CC:  Chief Complaint  Patient presents with  . home health care    Needs an order for [place] presents for evaluation and treatment of restlessness just prior to going to bed.  [name] [name] states that her mother will be up and down to the bathroom several times to urinate when in fact she does not need to go.  The neighbor security lights bother her.  She constantly asks about what time it is.  At this point she is not wandering.  Past Medical History:  Diagnosis Date  . Anxiety    loss of child 1 year ago  . Cancer ([place])    right breast  . Diabetes mellitus, type 2 ([place])   . Glaucoma   . Gout   . Hyperlipidemia   . Hypertension    states eccho Dr Mare Ferrari 1 year ago- no record found in [facility] or office records  . Hyperthyroidism     Past Surgical History:  Procedure Laterality Date  . ABDOMINAL HYSTERECTOMY  1995  . BREAST LUMPECTOMY  11/06/2011   Procedure: LUMPECTOMY;  Surgeon: Harl Bowie, MD;  Location: [facility];  Service: General;  Laterality: Right;  . CHOLECYSTECTOMY  1976  . ROTATOR CUFF REPAIR  1999   right    Family History  Problem Relation Age of Onset  . Cancer Mother        bladder  . Kidney disease Mother   . Heart attack Brother   . Stroke Father   . Hypertension Sister   . Hyperlipidemia Daughter   . Diabetes Sister     Social History   Socioeconomic History  . Marital status: Widowed    Spouse name: Not on file  . Number of children: Not on file  . Years of education: Not on file  . Highest education level: Not on file  Occupational History  . Not on file  Tobacco Use  . Smoking status: Never Smoker  . Smokeless tobacco: Never Used  Substance and Sexual Activity  . Alcohol use: No  . Drug use: No  . Sexual activity: Not on file  Other Topics Concern  .  Not on file  Social History Narrative   Daughter helps with pt considerably- she lives across the street from her daughter   Homemaker   Completed high school   2 children, both daughters, one died at age 17   Enjoys spending time outside, yard work.   No pets   Social Determinants of Health   Financial Resource Strain:   . Difficulty of Paying Living Expenses: Not on file  Food Insecurity:   . Worried About Charity fundraiser in the Last Year: Not on file  . Ran Out of Food in the Last Year: Not on file  Transportation Needs:   . Lack of Transportation (Medical): Not on file  . Lack of Transportation (Non-Medical): Not on file  Physical Activity:   . Days of Exercise per Week: Not on file  . Minutes of Exercise per Session: Not on file  Stress:   . Feeling of Stress : Not on file  Social Connections:   . Frequency of Communication with Friends and Family: Not on file  . Frequency of Social Gatherings with Friends and Family: Not on file  .  Attends Religious Services: Not on file  . Active Member of Clubs or Organizations: Not on file  . Attends Archivist Meetings: Not on file  . Marital Status: Not on file  Intimate Partner Violence:   . Fear of Current or Ex-Partner: Not on file  . Emotionally Abused: Not on file  . Physically Abused: Not on file  . Sexually Abused: Not on file    Outpatient Medications Prior to Visit  Medication Sig Dispense Refill  . acebutolol (SECTRAL) 200 MG capsule Take 1 capsule (200 mg total) by mouth at bedtime. 90 capsule 2  . acetaminophen (TYLENOL) 500 MG tablet Take 500 mg by mouth at bedtime.    [name] amLODipine (NORVASC) 5 MG tablet Take 1 tablet (5 mg total) by mouth every morning. 90 tablet 2  . atorvastatin (LIPITOR) 10 MG tablet Take 1 tablet (10 mg total) by mouth daily. 90 tablet 2  . gabapentin (NEURONTIN) 100 MG capsule Take 200 mg by mouth in the morning and 100 mg by mouth at bedtime 90 capsule 2  . glimepiride (AMARYL)  1 MG tablet Take 1 tablet (1 mg total) by mouth every morning. 90 tablet 2  . glucose blood (TRUETRACK TEST) test strip Use as instructed to check blood sugar once a day.  Dx E11.9 100 each 3  . hydroxypropyl methylcellulose / hypromellose (ISOPTO TEARS / GONIOVISC) 2.5 % ophthalmic solution Place 1 drop into both eyes 3 (three) times daily as needed for dry eyes.    [name] levothyroxine (SYNTHROID) 100 MCG tablet Take 1 tablet (100 mcg total) by mouth daily before breakfast. 90 tablet 2  . sitaGLIPtin (JANUVIA) 50 MG tablet Take 1 tablet (50 mg total) by mouth every morning. 90 tablet 2  . telmisartan (MICARDIS) 80 MG tablet Take 1 tablet (80 mg total) by mouth daily. 90 tablet 2  . TRUEPLUS LANCETS 33G MISC Use to check blood sugar once a day. Dx E11.9 100 each 3   No facility-administered medications prior to visit.    No Known Allergies  ROS Review of Systems  Constitutional: Negative.   Respiratory: Negative.   Cardiovascular: Negative.   Gastrointestinal: Negative.   Genitourinary: Negative for difficulty urinating, dysuria and frequency.  Neurological: Negative for tremors and speech difficulty.  Psychiatric/Behavioral: Positive for confusion and sleep disturbance. Negative for hallucinations.      Objective:    Physical Exam  Constitutional: No distress.  Pulmonary/Chest: Effort normal.  Psychiatric: She has a normal mood and affect. Her behavior is normal.    BP (!) 143/66  Wt Readings from Last 3 Encounters:  06/25/19 169 lb 5 oz (76.8 kg)  05/28/19 178 lb (80.7 kg)  12/08/18 185 lb (83.9 kg)     Health Maintenance Due  Topic Date Due  . TETANUS/TDAP  08/21/1940  . DEXA SCAN  08/21/1986  . MAMMOGRAM  11/18/2017  . OPHTHALMOLOGY EXAM  05/19/2019    There are no preventive care reminders to display for this patient.  Lab Results  Component Value Date   TSH 3.57 06/08/2019   Lab Results  Component Value Date   WBC 10.2 06/26/2019   HGB 12.4 06/26/2019    HCT 35.2 (L) 06/26/2019   MCV 94.4 06/26/2019   PLT 224 06/26/2019   Lab Results  Component Value Date   NA 139 08/13/2019   K 4.2 08/13/2019   CHLORIDE 102 01/04/2016   CO2 26 08/13/2019   GLUCOSE 165 (H) 08/13/2019   BUN 18 08/13/2019  CREATININE 1.25 (H) 08/13/2019   BILITOT 0.5 06/08/2019   ALKPHOS 52 06/08/2019   AST 15 06/08/2019   ALT 12 06/08/2019   PROT 6.7 06/08/2019   ALBUMIN 3.9 06/08/2019   CALCIUM 9.6 08/13/2019   ANIONGAP 10 06/26/2019   EGFR 29 (L) 01/04/2016   GFR 38.86 (L) 07/13/2019   Lab Results  Component Value Date   CHOL 172 06/08/2019   Lab Results  Component Value Date   HDL 42.30 06/08/2019   Lab Results  Component Value Date   LDLCALC 93 03/12/2016   Lab Results  Component Value Date   TRIG 287.0 (H) 06/08/2019   Lab Results  Component Value Date   CHOLHDL 4 06/08/2019   Lab Results  Component Value Date   HGBA1C 6.8 (H) 06/08/2019      Assessment & Plan:   Problem List Items Addressed This Visit      Nervous and Auditory   Dementia with behavioral disturbance (HCC) - Primary   Relevant Medications   haloperidol (HALDOL) 0.5 MG tablet     Other   SunDown syndrome   Relevant Medications   haloperidol (HALDOL) 0.5 MG tablet      Meds ordered this encounter  Medications  . haloperidol (HALDOL) 0.5 MG tablet    Sig: Take one to two before bed as needed.    Dispense:  30 tablet    Refill:  0    Follow-up: No follow-ups on file.   [name] also requested a referral to [facility].  Daughter will be paying for them to stay with her mother a couple nights a week.  I had previously referred the patient for visiting home health and they had called patient's daughter and said that they would be unable to stay with her throughout the night.  Daughter declined their services.  With patient responsible for the fee referral was not needed at this time.  [name] will check with [facility] to see if they  will reimburse her mother for the expense.  Discussed using low-dose Haldol to see if that will help with mom's prebedtime behavior.  May need to increase dose. [name], MD   Virtual Visit via Video Note  I connected with [name] on 12/01/19 at  9:00 AM EST by a video enabled telemedicine application and verified that I am speaking with the correct person using two identifiers.  Location: Patient: home with daughter Provider: With   I discussed the limitations of evaluation and management by telemedicine and the availability of in person appointments. The patient expressed understanding and agreed to proceed.  History of Present Illness:    Observations/Objective:   Assessment and Plan:   Follow Up Instructions:    I discussed the assessment and treatment plan with the patient. The patient was provided an opportunity to ask questions and all were answered. The patient agreed with the plan and demonstrated an understanding of the instructions.   The patient was advised to call back or seek an in-person evaluation if the symptoms worsen or if the condition fails to improve as anticipated.  I provided 25 minutes of non-face-to-face time during this encounter.   [name], MD   Interactive video and audio telecommunications were attempted between myself and the patient. However they failed due to the patient having technical difficulties or not having access to video capability. We continued and completed with audio only.

## 2019-12-15 MED ORDER — HALOPERIDOL 10 MG PO TABS: ORAL_TABLET | ORAL | 1 refills | Status: DC

## 2019-12-15 NOTE — Addendum Note (Signed)
Addended by: Jon Billings on: 12/15/2019 11:44 AM   Modules accepted: Orders

## 2019-12-24 ENCOUNTER — Telehealth: Payer: Self-pay

## 2019-12-24 DIAGNOSIS — F0391 Unspecified dementia with behavioral disturbance: Secondary | ICD-10-CM

## 2019-12-24 MED ORDER — TRAZODONE HCL 50 MG PO TABS: 25.0000 mg | ORAL_TABLET | Freq: Every evening | ORAL | 0 refills | Status: DC | PRN

## 2019-12-24 NOTE — Telephone Encounter (Signed)
Dr. Ethelene Hal, daughter of Mrs. Rashiya Ellingwood sent message below through her [place]. Please advise    Just wanted you to know the latest Rx you prescribed for mom Antwan Bribiesca Cohrs) is not working at all. Instead of helping her sleep, it just made her weak as water and shakey.  It takes 2 people to get her up and in the bathroom.  Is there another med that will not have adverse affects for her? [name] Thanks for your help.

## 2019-12-24 NOTE — Telephone Encounter (Signed)
error 

## 2019-12-29 NOTE — Telephone Encounter (Signed)
Patient daughter is returning the call. CB is [phone]

## 2019-12-29 NOTE — Telephone Encounter (Signed)
Called pt's daughter several time to inform of medication beng sent in, also left messages with no return call. I called pharmacy to see if medication have been picked up, it was picked up.

## 2019-12-29 NOTE — Telephone Encounter (Signed)
Spoke with patients daughter who states that she have not tried the medication as of now but she will try it tonight and let us know how things go.

## 2019-12-31 ENCOUNTER — Telehealth: Payer: Self-pay | Admitting: Family Medicine

## 2019-12-31 NOTE — Telephone Encounter (Signed)
Patient is calling and wanted to let speak to someone regarding how patient is doing on new medication. CB is [phone]

## 2019-12-31 NOTE — Telephone Encounter (Signed)
Spoke with patients daughter who states that patient does seem to get a little more sleep but she still waking up at night very angry pulling, pulling her clothes off and not wanting to do nothing you ask of her. Daughter not sure if this is normal and if she should continue taking the medication. Please advise.

## 2019-12-31 NOTE — Telephone Encounter (Signed)
Give it a few more days and let me know. We can do a virtual visit.

## 2020-01-03 NOTE — Telephone Encounter (Signed)
Called to check on patient to see how medications were doing. Per patients daughter she have stopped giving her the medication due to the reactions they were getting from it. Patient did sleep pretty good last night for the first time in 2 months. Per [name] she will hold on to the medication just in case they might want to try it again but for now she will stop it. No concerns at this time.

## 2020-01-31 ENCOUNTER — Encounter: Payer: Self-pay | Admitting: Family Medicine

## 2020-01-31 ENCOUNTER — Other Ambulatory Visit: Payer: Self-pay

## 2020-01-31 ENCOUNTER — Ambulatory Visit (INDEPENDENT_AMBULATORY_CARE_PROVIDER_SITE_OTHER): Payer: Medicare Other | Admitting: Family Medicine

## 2020-01-31 VITALS — BP 152/84 | HR 72 | Temp 97.1°F | Ht 67.0 in | Wt 159.8 lb

## 2020-01-31 DIAGNOSIS — E119 Type 2 diabetes mellitus without complications: Secondary | ICD-10-CM | POA: Diagnosis not present

## 2020-01-31 DIAGNOSIS — E039 Hypothyroidism, unspecified: Secondary | ICD-10-CM | POA: Diagnosis not present

## 2020-01-31 DIAGNOSIS — E785 Hyperlipidemia, unspecified: Secondary | ICD-10-CM

## 2020-01-31 DIAGNOSIS — F05 Delirium due to known physiological condition: Secondary | ICD-10-CM

## 2020-01-31 DIAGNOSIS — E118 Type 2 diabetes mellitus with unspecified complications: Secondary | ICD-10-CM | POA: Diagnosis not present

## 2020-01-31 DIAGNOSIS — E519 Thiamine deficiency, unspecified: Secondary | ICD-10-CM | POA: Diagnosis not present

## 2020-01-31 DIAGNOSIS — D539 Nutritional anemia, unspecified: Secondary | ICD-10-CM | POA: Diagnosis not present

## 2020-01-31 DIAGNOSIS — I10 Essential (primary) hypertension: Secondary | ICD-10-CM | POA: Diagnosis not present

## 2020-01-31 DIAGNOSIS — G6289 Other specified polyneuropathies: Secondary | ICD-10-CM

## 2020-01-31 MED ORDER — RISPERIDONE 0.5 MG PO TABS: 0.5000 mg | ORAL_TABLET | Freq: Every day | ORAL | 1 refills | Status: DC

## 2020-01-31 NOTE — Progress Notes (Signed)
Established Patient Office Visit  Subjective:  Patient ID: [name], female    DOB: 02/26/1919  Age: 86 y.o. MRN: [id number]  CC:  Chief Complaint  Patient presents with  . Follow-up    Routine check up, no concerns.     HPI [name] presents for follow-up of her hypertension, diabetes, hypothyroidism and sundowner syndrome.  Blood pressure is consistently running in the upper 140s to low 150s on Sectral, amlodipine, telmisartan.  Fasting sugars have been running in the 1 20-1 70 range on the Amaryl and Januvia.  She is using the Neurontin on a as needed basis for paresthesias.  Typically takes no more than 100 mg daily.  Has been having difficulty with sleeping at night.  She does nap often during the day.  Only at night she experiences urinary frequency.  There is no dysuria.  Often times no urine comes.  She definitely has more anxiety in the nighttime.  This is also affecting her sleep.  She has not responded to Haldol or trazodone.  Paresthesias in her legs and feet of affected her ability to walk confidently.  She has fallen.  She is now using her walker regularly.  Past Medical History:  Diagnosis Date  . Anxiety    loss of child 1 year ago  . Cancer ([place])    right breast  . Diabetes mellitus, type 2 ([place])   . Glaucoma   . Gout   . Hyperlipidemia   . Hypertension    states eccho Dr Mare Ferrari 1 year ago- no record [facility] in [facility] or office records  . Hyperthyroidism     Past Surgical History:  Procedure Laterality Date  . ABDOMINAL HYSTERECTOMY  1995  . BREAST LUMPECTOMY  11/06/2011   Procedure: LUMPECTOMY;  Surgeon: Harl Bowie, MD;  Location: [facility];  Service: General;  Laterality: Right;  . CHOLECYSTECTOMY  1976  . ROTATOR CUFF REPAIR  1999   right    Family History  Problem Relation Age of Onset  . Cancer Mother        bladder  . Kidney disease Mother   . Heart attack Brother   . Stroke Father   . Hypertension Sister   . Hyperlipidemia Daughter    . Diabetes Sister     Social History   Socioeconomic History  . Marital status: Widowed    Spouse name: Not on file  . Number of children: Not on file  . Years of education: Not on file  . Highest education level: Not on file  Occupational History  . Not on file  Tobacco Use  . Smoking status: Never Smoker  . Smokeless tobacco: Never Used  Substance and Sexual Activity  . Alcohol use: No  . Drug use: No  . Sexual activity: Not on file  Other Topics Concern  . Not on file  Social History Narrative   Daughter helps with pt considerably- she lives across the street from her daughter   Homemaker   Completed high school   2 children, both daughters, one died at age 36   Enjoys spending time outside, yard work.   No pets   Social Determinants of Health   Financial Resource Strain:   . Difficulty of Paying Living Expenses:   Food Insecurity:   . Worried About Charity fundraiser in the Last Year:   . Arboriculturist in the Last Year:   Transportation Needs:   . Lack of Transportation (  Medical):   [name] Lack of Transportation (Non-Medical):   Physical Activity:   . Days of Exercise per Week:   . Minutes of Exercise per Session:   Stress:   . Feeling of Stress :   Social Connections:   . Frequency of Communication with Friends and Family:   . Frequency of Social Gatherings with Friends and Family:   . Attends Religious Services:   . Active Member of Clubs or Organizations:   . Attends Archivist Meetings:   [name] Marital Status:   Intimate Partner Violence:   . Fear of Current or Ex-Partner:   . Emotionally Abused:   [name] Physically Abused:   . Sexually Abused:     Outpatient Medications Prior to Visit  Medication Sig Dispense Refill  . acebutolol (SECTRAL) 200 MG capsule Take 1 capsule (200 mg total) by mouth at bedtime. 90 capsule 2  . acetaminophen (TYLENOL) 500 MG tablet Take 500 mg by mouth at bedtime.    [name] amLODipine (NORVASC) 5 MG tablet Take 1 tablet (5  mg total) by mouth every morning. 90 tablet 2  . atorvastatin (LIPITOR) 10 MG tablet Take 1 tablet (10 mg total) by mouth daily. 90 tablet 2  . gabapentin (NEURONTIN) 100 MG capsule Take 200 mg by mouth in the morning and 100 mg by mouth at bedtime 90 capsule 2  . glimepiride (AMARYL) 1 MG tablet Take 1 tablet (1 mg total) by mouth every morning. 90 tablet 2  . glucose blood (TRUETRACK TEST) test strip Use as instructed to check blood sugar once a day.  Dx E11.9 100 each 3  . levothyroxine (SYNTHROID) 100 MCG tablet Take 1 tablet (100 mcg total) by mouth daily before breakfast. 90 tablet 2  . sitaGLIPtin (JANUVIA) 50 MG tablet Take 1 tablet (50 mg total) by mouth every morning. 90 tablet 2  . telmisartan (MICARDIS) 80 MG tablet Take 1 tablet (80 mg total) by mouth daily. 90 tablet 2  . TRUEPLUS LANCETS 33G MISC Use to check blood sugar once a day. Dx E11.9 100 each 3  . hydroxypropyl methylcellulose / hypromellose (ISOPTO TEARS / GONIOVISC) 2.5 % ophthalmic solution Place 1 drop into both eyes 3 (three) times daily as needed for dry eyes.    . traZODone (DESYREL) 50 MG tablet Take 0.5-1 tablets (25-50 mg total) by mouth at bedtime as needed for sleep. (Patient not taking: Reported on 01/31/2020) 30 tablet 0   No facility-administered medications prior to visit.    No Known Allergies  ROS Review of Systems  Constitutional: Negative.   HENT: Negative.   Eyes: Negative for photophobia and visual disturbance.  Respiratory: Negative.   Cardiovascular: Negative.   Gastrointestinal: Negative.   Endocrine: Negative for polyphagia and polyuria.  Genitourinary: Negative for difficulty urinating, enuresis, frequency and urgency.  Musculoskeletal: Positive for gait problem.  Skin: Negative for pallor and rash.  Allergic/Immunologic: Negative for immunocompromised state.  Neurological: Positive for numbness.      Objective:    Physical Exam  BP (!) 152/84   Pulse 72   Temp (!) 97.1 F (36.2  C) (Tympanic)   Ht 5' 7"  (1.702 m)   Wt 159 lb 12.8 oz (72.5 kg)   SpO2 99%   BMI 25.03 kg/m  Wt Readings from Last 3 Encounters:  01/31/20 159 lb 12.8 oz (72.5 kg)  06/25/19 169 lb 5 oz (76.8 kg)  05/28/19 178 lb (80.7 kg)     Health Maintenance Due  Topic Date Due  .  TETANUS/TDAP  Never done  . DEXA SCAN  Never done  . MAMMOGRAM  11/18/2017  . OPHTHALMOLOGY EXAM  05/19/2019  . HEMOGLOBIN A1C  12/09/2019    There are no preventive care reminders to display for this patient.  Lab Results  Component Value Date   TSH 3.57 06/08/2019   Lab Results  Component Value Date   WBC 10.2 06/26/2019   HGB 12.4 06/26/2019   HCT 35.2 (L) 06/26/2019   MCV 94.4 06/26/2019   PLT 224 06/26/2019   Lab Results  Component Value Date   NA 139 08/13/2019   K 4.2 08/13/2019   CHLORIDE 102 01/04/2016   CO2 26 08/13/2019   GLUCOSE 165 (H) 08/13/2019   BUN 18 08/13/2019   CREATININE 1.25 (H) 08/13/2019   BILITOT 0.5 06/08/2019   ALKPHOS 52 06/08/2019   AST 15 06/08/2019   ALT 12 06/08/2019   PROT 6.7 06/08/2019   ALBUMIN 3.9 06/08/2019   CALCIUM 9.6 08/13/2019   ANIONGAP 10 06/26/2019   EGFR 29 (L) 01/04/2016   GFR 38.86 (L) 07/13/2019   Lab Results  Component Value Date   CHOL 172 06/08/2019   Lab Results  Component Value Date   HDL 42.30 06/08/2019   Lab Results  Component Value Date   LDLCALC 93 03/12/2016   Lab Results  Component Value Date   TRIG 287.0 (H) 06/08/2019   Lab Results  Component Value Date   CHOLHDL 4 06/08/2019   Lab Results  Component Value Date   HGBA1C 6.8 (H) 06/08/2019      Assessment & Plan:   Problem List Items Addressed This Visit      Cardiovascular and Mediastinum   Essential hypertension - Primary   Relevant Orders   Basic metabolic panel   CBC   Microalbumin / creatinine urine ratio     Endocrine   Hypothyroidism   Relevant Orders   TSH   Diabetes mellitus type 2, noninsulin dependent (HCC)   Relevant Orders    Hemoglobin A1c   Urinalysis, Routine w reflex microscopic   Microalbumin / creatinine urine ratio     Nervous and Auditory   Peripheral neuropathy   Relevant Medications   risperiDONE (RISPERDAL) 0.5 MG tablet     Other   Hyperlipidemia   Relevant Orders   LDL cholesterol, direct   Macrocytic anemia   SunDown syndrome   Relevant Medications   risperiDONE (RISPERDAL) 0.5 MG tablet    Other Visit Diagnoses    Controlled type 2 diabetes mellitus with complication, without long-term current use of insulin (HCC)       Thiamine deficiency          Meds ordered this encounter  Medications  . risperiDONE (RISPERDAL) 0.5 MG tablet    Sig: Take 1 tablet (0.5 mg total) by mouth at bedtime.    Dispense:  30 tablet    Refill:  1    Follow-up: Return in about 1 month (around 03/02/2020).   Was hoping that the trazodone would help her sleep and the urinary frequency as well.  It did not.  And concerned about using an anti cholinergic for irritable bladder at this time.  May have to try it at some point.  We will try Risperdal at very low dose. [name], MD

## 2020-02-01 LAB — CBC
HCT: 40.6 % (ref 36.0–46.0)
Hemoglobin: 13.7 g/dL (ref 12.0–15.0)
MCHC: 33.9 g/dL (ref 30.0–36.0)
MCV: 97.6 fl (ref 78.0–100.0)
Platelets: 271 10*3/uL (ref 150.0–400.0)
RBC: 4.15 Mil/uL (ref 3.87–5.11)
RDW: 13.4 % (ref 11.5–15.5)
WBC: 9.3 10*3/uL (ref 4.0–10.5)

## 2020-02-01 LAB — URINALYSIS, ROUTINE W REFLEX MICROSCOPIC
Bilirubin Urine: NEGATIVE
Hgb urine dipstick: NEGATIVE
Ketones, ur: NEGATIVE
Nitrite: NEGATIVE
RBC / HPF: NONE SEEN (ref 0–?)
Specific Gravity, Urine: 1.015 (ref 1.000–1.030)
Total Protein, Urine: >=300 — AB
Urine Glucose: NEGATIVE
Urobilinogen, UA: 0.2 (ref 0.0–1.0)
pH: 7 (ref 5.0–8.0)

## 2020-02-01 LAB — BASIC METABOLIC PANEL
BUN: 20 mg/dL (ref 6–23)
CO2: 28 mEq/L (ref 19–32)
Calcium: 9.8 mg/dL (ref 8.4–10.5)
Chloride: 101 mEq/L (ref 96–112)
Creatinine, Ser: 1.32 mg/dL — ABNORMAL HIGH (ref 0.40–1.20)
GFR: 37.13 mL/min — ABNORMAL LOW (ref >60.00)
Glucose, Bld: 146 mg/dL — ABNORMAL HIGH (ref 70–99)
Potassium: 3.9 mEq/L (ref 3.5–5.1)
Sodium: 137 mEq/L (ref 135–145)

## 2020-02-01 LAB — HEMOGLOBIN A1C: Hgb A1c MFr Bld: 6.3 % (ref 4.6–6.5)

## 2020-02-01 LAB — TSH: TSH: 1.4 u[IU]/mL (ref 0.35–4.50)

## 2020-02-01 LAB — LDL CHOLESTEROL, DIRECT: Direct LDL: 114 mg/dL

## 2020-02-01 LAB — MICROALBUMIN / CREATININE URINE RATIO
Creatinine,U: 34.6 mg/dL
Microalb Creat Ratio: 424.8 mg/g — ABNORMAL HIGH (ref 0.0–30.0)
Microalb, Ur: 147.2 mg/dL — ABNORMAL HIGH (ref 0.0–1.9)

## 2020-02-02 ENCOUNTER — Telehealth: Payer: Self-pay | Admitting: Family Medicine

## 2020-02-02 ENCOUNTER — Emergency Department (HOSPITAL_COMMUNITY): Payer: Medicare Other

## 2020-02-02 ENCOUNTER — Emergency Department (HOSPITAL_COMMUNITY)
Admission: EM | Admit: 2020-02-02 | Discharge: 2020-02-03 | Disposition: A | Discharge status: Home / Self Care | Payer: Medicare Other | Attending: Emergency Medicine | Admitting: Emergency Medicine

## 2020-02-02 ENCOUNTER — Other Ambulatory Visit: Payer: Self-pay

## 2020-02-02 DIAGNOSIS — R531 Weakness: Secondary | ICD-10-CM | POA: Diagnosis not present

## 2020-02-02 DIAGNOSIS — E1122 Type 2 diabetes mellitus with diabetic chronic kidney disease: Secondary | ICD-10-CM | POA: Diagnosis not present

## 2020-02-02 DIAGNOSIS — I10 Essential (primary) hypertension: Secondary | ICD-10-CM | POA: Diagnosis not present

## 2020-02-02 DIAGNOSIS — R509 Fever, unspecified: Secondary | ICD-10-CM | POA: Insufficient documentation

## 2020-02-02 DIAGNOSIS — F0390 Unspecified dementia without behavioral disturbance: Secondary | ICD-10-CM | POA: Insufficient documentation

## 2020-02-02 DIAGNOSIS — N183 Chronic kidney disease, stage 3 unspecified: Secondary | ICD-10-CM | POA: Insufficient documentation

## 2020-02-02 DIAGNOSIS — T50Z95A Adverse effect of other vaccines and biological substances, initial encounter: Secondary | ICD-10-CM | POA: Diagnosis not present

## 2020-02-02 DIAGNOSIS — E1140 Type 2 diabetes mellitus with diabetic neuropathy, unspecified: Secondary | ICD-10-CM | POA: Diagnosis not present

## 2020-02-02 DIAGNOSIS — Z79899 Other long term (current) drug therapy: Secondary | ICD-10-CM | POA: Insufficient documentation

## 2020-02-02 DIAGNOSIS — Z853 Personal history of malignant neoplasm of breast: Secondary | ICD-10-CM | POA: Diagnosis not present

## 2020-02-02 DIAGNOSIS — E039 Hypothyroidism, unspecified: Secondary | ICD-10-CM | POA: Diagnosis not present

## 2020-02-02 DIAGNOSIS — R404 Transient alteration of awareness: Secondary | ICD-10-CM | POA: Diagnosis not present

## 2020-02-02 DIAGNOSIS — T887XXA Unspecified adverse effect of drug or medicament, initial encounter: Secondary | ICD-10-CM | POA: Insufficient documentation

## 2020-02-02 DIAGNOSIS — I129 Hypertensive chronic kidney disease with stage 1 through stage 4 chronic kidney disease, or unspecified chronic kidney disease: Secondary | ICD-10-CM | POA: Insufficient documentation

## 2020-02-02 DIAGNOSIS — Z7984 Long term (current) use of oral hypoglycemic drugs: Secondary | ICD-10-CM | POA: Diagnosis not present

## 2020-02-02 DIAGNOSIS — T50B95A Adverse effect of other viral vaccines, initial encounter: Secondary | ICD-10-CM | POA: Diagnosis not present

## 2020-02-02 DIAGNOSIS — R0902 Hypoxemia: Secondary | ICD-10-CM | POA: Diagnosis not present

## 2020-02-02 DIAGNOSIS — F05 Delirium due to known physiological condition: Secondary | ICD-10-CM

## 2020-02-02 DIAGNOSIS — R4182 Altered mental status, unspecified: Secondary | ICD-10-CM | POA: Diagnosis not present

## 2020-02-02 LAB — LACTIC ACID, PLASMA
Lactic Acid, Venous: 3 mmol/L (ref 0.5–1.9)
Lactic Acid, Venous: 2 mmol/L (ref 0.5–1.9)

## 2020-02-02 LAB — COMPREHENSIVE METABOLIC PANEL
ALT: 16 U/L (ref 0–44)
AST: 20 U/L (ref 15–41)
Albumin: 3.2 g/dL — ABNORMAL LOW (ref 3.5–5.0)
Alkaline Phosphatase: 49 U/L (ref 38–126)
Anion gap: 12 (ref 5–15)
BUN: 19 mg/dL (ref 8–23)
CO2: 22 mmol/L (ref 22–32)
Calcium: 8.6 mg/dL — ABNORMAL LOW (ref 8.9–10.3)
Chloride: 102 mmol/L (ref 98–111)
Creatinine, Ser: 1.41 mg/dL — ABNORMAL HIGH (ref 0.44–1.00)
GFR calc Af Amer: 36 mL/min — ABNORMAL LOW (ref >60)
GFR calc non Af Amer: 31 mL/min — ABNORMAL LOW (ref >60)
Glucose, Bld: 213 mg/dL — ABNORMAL HIGH (ref 70–99)
Potassium: 3.1 mmol/L — ABNORMAL LOW (ref 3.5–5.1)
Sodium: 136 mmol/L (ref 135–145)
Total Bilirubin: 0.3 mg/dL (ref 0.3–1.2)
Total Protein: 6 g/dL — ABNORMAL LOW (ref 6.5–8.1)

## 2020-02-02 LAB — CBC WITH DIFFERENTIAL/PLATELET
Abs Immature Granulocytes: 0.02 10*3/uL (ref 0.00–0.07)
Basophils Absolute: 0 10*3/uL (ref 0.0–0.1)
Basophils Relative: 0 %
Eosinophils Absolute: 0 10*3/uL (ref 0.0–0.5)
Eosinophils Relative: 0 %
HCT: 35.7 % — ABNORMAL LOW (ref 36.0–46.0)
Hemoglobin: 12.2 g/dL (ref 12.0–15.0)
Immature Granulocytes: 0 %
Lymphocytes Relative: 15 %
Lymphs Abs: 0.9 10*3/uL (ref 0.7–4.0)
MCH: 33.1 pg (ref 26.0–34.0)
MCHC: 34.2 g/dL (ref 30.0–36.0)
MCV: 96.7 fL (ref 80.0–100.0)
Monocytes Absolute: 0.4 10*3/uL (ref 0.1–1.0)
Monocytes Relative: 7 %
Neutro Abs: 4.3 10*3/uL (ref 1.7–7.7)
Neutrophils Relative %: 78 %
Platelets: 175 10*3/uL (ref 150–400)
RBC: 3.69 MIL/uL — ABNORMAL LOW (ref 3.87–5.11)
RDW: 12.5 % (ref 11.5–15.5)
WBC: 5.6 10*3/uL (ref 4.0–10.5)
nRBC: 0 % (ref 0.0–0.2)

## 2020-02-02 LAB — PROTIME-INR
INR: 1.1 (ref 0.8–1.2)
Prothrombin Time: 14.3 seconds (ref 11.4–15.2)

## 2020-02-02 LAB — APTT: aPTT: 29 seconds (ref 24–36)

## 2020-02-02 MED ORDER — LACTATED RINGERS IV BOLUS
500.0000 mL | Freq: Once | INTRAVENOUS | Status: AC
  Administered 2020-02-02: 500 mL via INTRAVENOUS

## 2020-02-02 MED ORDER — LACTATED RINGERS IV BOLUS (SEPSIS)
500.0000 mL | Freq: Once | INTRAVENOUS | Status: AC
  Administered 2020-02-02: 500 mL via INTRAVENOUS

## 2020-02-02 NOTE — Progress Notes (Signed)
Notified provider of need to order antibiotics.   

## 2020-02-02 NOTE — Telephone Encounter (Signed)
Patient daughter [name] is calling and wanted to speak to someone regarding medication that was prescribed to patient yesterday. CB is [phone]

## 2020-02-02 NOTE — ED Provider Notes (Signed)
[facility] DEPT Provider Note   CSN: [id number] Arrival date & time: 02/02/20  1945     History Chief Complaint  Patient presents with  . Altered Mental Status    [name] is a 86 y.o. female.  Patient is a 86 year old female with a history of dementia, hypertension, hyperlipidemia, diabetes, CKD who is presenting today with generalized weakness and fever.  Patient reports that she got her second Covid vaccine yesterday and based on notes from telephone conversations from her daughter and the doctor she had a bad night last night where she was up the majority of the night despite trying Risperdal for the first time for her sundowners and dementia.  Today she has been generally weak and was febrile up to 101 at home.  She reports she just has a hard time getting up and moving around because she just feels so tired.  She denies any shortness of breath, chest pain, abdominal pain.  She denies any nausea or vomiting.  Patient did have labs done 2 days ago by PCP that showed protein in the urine but no evidence of a UTI.  Based on 2 days ago patient's white count and renal function were at baseline.  The history is provided by the patient and the EMS personnel.  Altered Mental Status Presenting symptoms: disorientation and lethargy   Severity:  Moderate Most recent episode:  Today Episode history:  Continuous Timing:  Constant Progression:  Worsening Chronicity:  New      Past Medical History:  Diagnosis Date  . Anxiety    loss of child 1 year ago  . Cancer ([place])    right breast  . Diabetes mellitus, type 2 ([place])   . Glaucoma   . Gout   . Hyperlipidemia   . Hypertension    states eccho Dr Mare Ferrari 1 year ago- no record found in [facility] or office records  . Hyperthyroidism     Patient Active Problem List   Diagnosis Date Noted  . Dementia with behavioral disturbance ([place]) 12/01/2019  . 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 Open wound of arm with  complication, right, initial encounter 08/13/2019  . Need for influenza vaccination 08/13/2019  . Hypochloremia 07/13/2019  . Syncope 06/24/2019  . Hyponatremia   . Pain due to onychomycosis of toenails of both feet 05/18/2019  . Diabetic neuropathy ([place]) 05/18/2019  . Macrocytic anemia 12/08/2018  . Dehydration 08/06/2018  . Hospital discharge follow-up 08/06/2018  . Abnormal liver function   . Acute lower UTI 07/31/2018  . Sepsis ([place]) 07/31/2018  . Hypokalemia 07/31/2018  . AKI (acute kidney injury) ([place]) 07/31/2018  . CKD (chronic kidney disease) stage 3, GFR 30-59 ml/min 07/09/2018  . Urinary frequency 04/14/2018  . Hyperlipidemia 04/08/2017  . Right knee pain 12/10/2016  . Osteoarthritis 04/23/2016  . Peripheral neuropathy 04/23/2016  . Joint pain, hip 06/22/2015  . Skin lesion of face 06/10/2013  . General medical examination 10/30/2011  . Cancer of overlapping sites of right female breast ([place]) 10/09/2011  . Hypothyroidism 12/13/2010  . Diabetes mellitus type 2, noninsulin dependent ([place]) 12/13/2010  . Gout 12/13/2010  . ANXIETY 12/13/2010  . Essential hypertension 12/13/2010    Past Surgical History:  Procedure Laterality Date  . ABDOMINAL HYSTERECTOMY  1995  . BREAST LUMPECTOMY  11/06/2011   Procedure: LUMPECTOMY;  Surgeon: Harl Bowie, MD;  Location: [facility] ORS;  Service: General;  Laterality: Right;  . CHOLECYSTECTOMY  1976  . [place]  right     OB History   No obstetric history on file.     Family History  Problem Relation Age of Onset  . Cancer Mother        bladder  . Kidney disease Mother   . Heart attack Brother   . Stroke Father   . Hypertension Sister   . Hyperlipidemia Daughter   . Diabetes Sister     Social History   Tobacco Use  . Smoking status: Never Smoker  . Smokeless tobacco: Never Used  Substance Use Topics  . Alcohol use: No  . Drug use: No    Home Medications Prior to Admission medications     Medication Sig Start Date End Date Taking? Authorizing Provider  acebutolol (SECTRAL) 200 MG capsule Take 1 capsule (200 mg total) by mouth at bedtime. 10/20/19   [name], MD  acetaminophen (TYLENOL) 500 MG tablet Take 500 mg by mouth at bedtime.    [provider]  amLODipine (NORVASC) 5 MG tablet Take 1 tablet (5 mg total) by mouth every morning. 10/20/19   [name], MD  atorvastatin (LIPITOR) 10 MG tablet Take 1 tablet (10 mg total) by mouth daily. 10/20/19   [name], MD  gabapentin (NEURONTIN) 100 MG capsule Take 200 mg by mouth in the morning and 100 mg by mouth at bedtime 10/20/19   [name], MD  glimepiride (AMARYL) 1 MG tablet Take 1 tablet (1 mg total) by mouth every morning. 10/20/19 10/19/20  [name], MD  glucose blood (TRUETRACK TEST) test strip Use as instructed to check blood sugar once a day.  Dx E11.9 12/29/18   [name], MD  hydroxypropyl methylcellulose / hypromellose (ISOPTO TEARS / GONIOVISC) 2.5 % ophthalmic solution Place 1 drop into both eyes 3 (three) times daily as needed for dry eyes.    [provider]  levothyroxine (SYNTHROID) 100 MCG tablet Take 1 tablet (100 mcg total) by mouth daily before breakfast. 10/20/19   [name], MD  risperiDONE (RISPERDAL) 0.5 MG tablet Take 1 tablet (0.5 mg total) by mouth at bedtime. 01/31/20   [name], MD  sitaGLIPtin (JANUVIA) 50 MG tablet Take 1 tablet (50 mg total) by mouth every morning. 10/20/19   [name], MD  telmisartan (MICARDIS) 80 MG tablet Take 1 tablet (80 mg total) by mouth daily. 10/20/19   [name], MD  TRUEPLUS LANCETS 33G MISC Use to check blood sugar once a day. Dx E11.9 12/29/18   [name], MD    Allergies    Patient has no known allergies.  Review of Systems   Review of Systems  All other systems reviewed and are negative.   Physical Exam Updated  Vital Signs BP 126/70   Pulse 87   Temp 99.8 F (37.7 C) (Oral)   Resp 16   Ht 5\' 7"  (1.702 m)   Wt 72.1 kg   SpO2 93%   BMI 24.90 kg/m   Physical Exam Vitals and nursing note reviewed.  Constitutional:      General: She is not in acute distress.    Appearance: Normal appearance. She is well-developed and normal weight.  HENT:     Head: Normocephalic and atraumatic.     Mouth/Throat:     Mouth: Mucous membranes are dry.  Eyes:     Pupils: Pupils are equal, round, and reactive to light.  Cardiovascular:     Rate and Rhythm: Normal  rate and regular rhythm.     Heart sounds: Normal heart sounds. No murmur. No friction rub.  Pulmonary:     Effort: Pulmonary effort is normal.     Breath sounds: Normal breath sounds. No wheezing or rales.  Abdominal:     General: Bowel sounds are normal. There is no distension.     Palpations: Abdomen is soft.     Tenderness: There is no abdominal tenderness. There is no guarding or rebound.  Musculoskeletal:        General: No tenderness. Normal range of motion.     Cervical back: Normal range of motion.     Comments: No edema  Skin:    General: Skin is warm and dry.     Findings: No rash.  Neurological:     General: No focal deficit present.     Mental Status: She is alert.     Cranial Nerves: No cranial nerve deficit.     Comments: Oriented to person and place.  Is able to answer questions.  Does appear weak when attempting to stand and ambulate she is very shaky but no asymmetric weakness  Psychiatric:     Comments: Reported to be hallucinating at home but no active hallucination at this time     ED Results / Procedures / Treatments   Labs (all labs ordered are listed, but only abnormal results are displayed) Labs Reviewed  LACTIC ACID, PLASMA - Abnormal; Notable for the following components:      Result Value   Lactic Acid, Venous 3.0 (*)    All other components within normal limits  LACTIC ACID, PLASMA - Abnormal; Notable for  the following components:   Lactic Acid, Venous 2.0 (*)    All other components within normal limits  COMPREHENSIVE METABOLIC PANEL - Abnormal; Notable for the following components:   Potassium 3.1 (*)    Glucose, Bld 213 (*)    Creatinine, Ser 1.41 (*)    Calcium 8.6 (*)    Total Protein 6.0 (*)    Albumin 3.2 (*)    GFR calc non Af Amer 31 (*)    GFR calc Af Amer 36 (*)    All other components within normal limits  CBC WITH DIFFERENTIAL/PLATELET - Abnormal; Notable for the following components:   RBC 3.69 (*)    HCT 35.7 (*)    All other components within normal limits  CULTURE, BLOOD (ROUTINE X 2)  CULTURE, BLOOD (ROUTINE X 2)  APTT  PROTIME-INR    EKG EKG Interpretation  Date/Time:  Wednesday February 02 2020 21:01:03 EDT Ventricular Rate:  75 PR Interval:    QRS Duration: 89 QT Interval:  411 QTC Calculation: 460 R Axis:   -64 Text Interpretation: Sinus rhythm Prolonged PR interval Left anterior fascicular block Anterior infarct, old No significant change since last tracing Confirmed by Blanchie Dessert [phone]) on 02/02/2020 9:22:09 PM   Radiology DG Chest Port 1 View  Result Date: 02/02/2020 CLINICAL DATA:  86 year old female with fever and weakness. EXAM: PORTABLE CHEST 1 VIEW COMPARISON:  Chest radiograph dated 06/24/2019. FINDINGS: Minimal left lung base hazy density similar to prior radiograph and likely atelectasis. No focal consolidation, pleural effusion, pneumothorax. Stable cardiac silhouette. Atherosclerotic calcification of the aorta. No acute osseous pathology. IMPRESSION: No acute cardiopulmonary process. No significant interval change. Electronically Signed   By: Anner Crete M.D.   On: 02/02/2020 21:18    Procedures Procedures (including critical care time)  Medications Ordered in ED Medications  lactated  ringers bolus 500 mL (has no administration in time range)    ED Course  I have reviewed the triage vital signs and the nursing  notes.  Pertinent labs & imaging results that were available during my care of the patient were reviewed by me and considered in my medical decision making (see chart for details).    MDM Rules/Calculators/A&P                      Elderly female presenting from home due to altered mental status and generalized weakness today.  Patient did receive second Covid vaccine yesterday and has been febrile today and hallucinating.  Patient also started Risperdal right at night for the first time.  On exam patient has no focal deficits but appears very weak when attempting to walk.  She did have urine done by PCP 2 days ago that showed protein but no signs of infection.  Suspect patient's fever is most likely related to the second vaccine however code sepsis labs were initiated.  Patient was given some IV fluid and received Tylenol prior to arrival here.  Chest x-ray pending.  10:42 PM Patient's labs without acute findings except for a lactate of 3.0.  Chest x-ray is clear, CMP with mild hypokalemia 3.1 but stable creatinine, CBC within normal limits.  Patient had received 500 mL of LR and was given a second 500 mL bolus.  On repeat evaluation patient is resting comfortably and states she is feeling better.  Spoke with her daughter who states that she has people who come in and help her and that if patient is able to stand and ambulate she is happy to have her come back home.  Suspect patient's fever is most likely related to the Covid vaccine in addition to being dehydrated and potential reaction from the Risperdal she started for the first time last night.  However we will see if patient is able to stand and ambulate after her IV bolus.  If not we will plan on admission  11:57 PM Repeat lactate is 2.  Will attempt to ambulate patient to see how she does whether she is suitable for discharge home versus hospitalization.  Patient checked out to Dr. Florina Ou at midnight.  Final Clinical Impression(s) / ED  Diagnoses Final diagnoses:  None    Rx / DC Orders ED Discharge Orders    None       Blanchie Dessert, MD 02/02/20 2357

## 2020-02-02 NOTE — ED Triage Notes (Signed)
Patient in from home with the complaints of AMS, has  gradually began to become altered since yesterday @ 1 oclock after receiving covid shot, started risperidone last night, will respond to questionining but has been hallucinating talking to dead family members, usually very sharp and oriented per family members, PCP called and stated she had protein in urine   101.67F 1G tylenol 180/100 96hr 18R 95% cbg 201

## 2020-02-02 NOTE — Telephone Encounter (Signed)
Returned the call, spoke with [name] who states that patient had Resperidone last night and it was not a good night for them patient was up all night and today seems as if her knees are buckled and she's  Not able to walk but trying to get up without help. [name] (daughter) would like to know if there is anything else she can do? She states that she will not give the medication tonight. Please advise

## 2020-02-02 NOTE — Telephone Encounter (Signed)
Call patient, spoke with her daughter [name].  She stated patient wasn't feeling well and did not want to schedule at this time.

## 2020-02-03 MED ORDER — QUETIAPINE FUMARATE 25 MG PO TABS: 25.0000 mg | ORAL_TABLET | Freq: Every day | ORAL | 0 refills | Status: DC

## 2020-02-03 NOTE — ED Notes (Signed)
Daughter contacted again at this time. States she can not drive @ night and will be there first thing in the morning

## 2020-02-03 NOTE — ED Notes (Signed)
Daughter contacted, made aware that mother was ready for discharge @ this time

## 2020-02-03 NOTE — ED Provider Notes (Signed)
12:20 AM Patient ambulating better than on arrival.  She appears able to be discharged home.     Durante Violett, Jenny Reichmann, MD 02/03/20 [phone]

## 2020-02-03 NOTE — Telephone Encounter (Signed)
Spoke with patients daughter who states that she had to end up call the ambulance last night due to [name] not acting like herself. They kept her for a few hours said she was dehydrated and that the sleeping pills plus covid vaccine all at once might have been to much for her. Per [name] she will hold on medications at this time and just continue to be up with patient at night.

## 2020-02-07 LAB — CULTURE, BLOOD (ROUTINE X 2)
Culture: NO GROWTH
Culture: NO GROWTH
Special Requests: ADEQUATE
Special Requests: ADEQUATE

## 2020-02-23 ENCOUNTER — Other Ambulatory Visit: Payer: Self-pay

## 2020-02-23 ENCOUNTER — Ambulatory Visit (INDEPENDENT_AMBULATORY_CARE_PROVIDER_SITE_OTHER): Payer: Medicare Other | Admitting: Podiatry

## 2020-02-23 ENCOUNTER — Encounter: Payer: Self-pay | Admitting: Podiatry

## 2020-02-23 VITALS — Temp 98.4°F

## 2020-02-23 DIAGNOSIS — M79674 Pain in right toe(s): Secondary | ICD-10-CM

## 2020-02-23 DIAGNOSIS — M79675 Pain in left toe(s): Secondary | ICD-10-CM | POA: Diagnosis not present

## 2020-02-23 DIAGNOSIS — E1142 Type 2 diabetes mellitus with diabetic polyneuropathy: Secondary | ICD-10-CM | POA: Diagnosis not present

## 2020-02-23 DIAGNOSIS — B351 Tinea unguium: Secondary | ICD-10-CM | POA: Diagnosis not present

## 2020-02-23 NOTE — Progress Notes (Signed)
This patient returns to my office for at risk foot care.  This patient requires this care by a professional since this patient will be at risk due to having diabetic neuropathy and CKD.  Patient  Is brought to the office by female caregiver.  This patient is unable to cut nails herself since the patient cannot reach her nails.These nails are painful walking and wearing shoes.  This patient presents for at risk foot care today.  General Appearance  Alert, conversant and in no acute stress.  Vascular  Dorsalis pedis and posterior tibial  pulses are palpable  bilaterally.  Capillary return is within normal limits  bilaterally. Temperature is within normal limits  bilaterally.  Neurologic  Senn-Weinstein monofilament wire test diminished bilaterally. Muscle power within normal limits bilaterally.  Nails Thick disfigured discolored nails with subungual debris  from hallux to fifth toes bilaterally. No evidence of bacterial infection or drainage bilaterally.  Orthopedic  No limitations of motion  feet .  No crepitus or effusions noted.  Hammer toes.  Skin  normotropic skin with no porokeratosis noted bilaterally.  No signs of infections or ulcers noted.     Onychomycosis  Pain in right toes  Pain in left toes  Consent was obtained for treatment procedures.   Mechanical debridement of nails 1-5  bilaterally performed with a nail nipper.  Filed with dremel without incident.    Return office visit    3 months                  Told patient to return for periodic foot care and evaluation due to potential at risk complications.   Gardiner Barefoot DPM

## 2020-02-24 DIAGNOSIS — H353132 Nonexudative age-related macular degeneration, bilateral, intermediate dry stage: Secondary | ICD-10-CM | POA: Diagnosis not present

## 2020-02-29 ENCOUNTER — Other Ambulatory Visit (INDEPENDENT_AMBULATORY_CARE_PROVIDER_SITE_OTHER): Payer: Medicare Other

## 2020-02-29 DIAGNOSIS — R350 Frequency of micturition: Secondary | ICD-10-CM

## 2020-02-29 LAB — POCT URINALYSIS DIPSTICK
Bilirubin, UA: NEGATIVE
Blood, UA: NEGATIVE
Glucose, UA: NEGATIVE
Ketones, UA: NEGATIVE
Leukocytes, UA: NEGATIVE
Nitrite, UA: NEGATIVE
Protein, UA: POSITIVE — AB
Spec Grav, UA: 1.015 (ref 1.010–1.025)
Urobilinogen, UA: 0.2 E.U./dL
pH, UA: 7 (ref 5.0–8.0)

## 2020-03-01 ENCOUNTER — Encounter: Payer: Self-pay | Admitting: Family Medicine

## 2020-03-01 ENCOUNTER — Ambulatory Visit (INDEPENDENT_AMBULATORY_CARE_PROVIDER_SITE_OTHER): Payer: Medicare Other | Admitting: Family Medicine

## 2020-03-01 VITALS — Ht 67.0 in | Wt 160.0 lb

## 2020-03-01 DIAGNOSIS — R300 Dysuria: Secondary | ICD-10-CM | POA: Diagnosis not present

## 2020-03-01 LAB — URINE CULTURE
MICRO NUMBER:: 10357412
SPECIMEN QUALITY:: ADEQUATE

## 2020-03-01 NOTE — Progress Notes (Signed)
Established Patient Office Visit  Subjective:  Patient ID: [name], female    DOB: 1919-08-26  Age: 86 y.o. MRN: [id number]  CC:  Chief Complaint  Patient presents with  . Pain    c/o back pain that come and go x 1 week    HPI [name] presents for lower back pain with slight dysuria. Has new bedside commode and has some difficulty standing after using. Symptoms seem to be resolving now.   Past Medical History:  Diagnosis Date  . Anxiety    loss of child 1 year ago  . Cancer ([place])    right breast  . Diabetes mellitus, type 2 ([place])   . Glaucoma   . Gout   . Hyperlipidemia   . Hypertension    states eccho Dr Mare Ferrari 1 year ago- no record found in [facility] or office records  . Hyperthyroidism     Past Surgical History:  Procedure Laterality Date  . ABDOMINAL HYSTERECTOMY  1995  . BREAST LUMPECTOMY  11/06/2011   Procedure: LUMPECTOMY;  Surgeon: Harl Bowie, MD;  Location: [facility] ORS;  Service: General;  Laterality: Right;  . CHOLECYSTECTOMY  1976  . ROTATOR CUFF REPAIR  1999   right    Family History  Problem Relation Age of Onset  . Cancer Mother        bladder  . Kidney disease Mother   . Heart attack Brother   . Stroke Father   . Hypertension Sister   . Hyperlipidemia Daughter   . Diabetes Sister     Social History   Socioeconomic History  . Marital status: Widowed    Spouse name: Not on file  . Number of children: Not on file  . Years of education: Not on file  . Highest education level: Not on file  Occupational History  . Not on file  Tobacco Use  . Smoking status: Never Smoker  . Smokeless tobacco: Never Used  Substance and Sexual Activity  . Alcohol use: No  . Drug use: No  . Sexual activity: Not on file  Other Topics Concern  . Not on file  Social History Narrative   Daughter helps with pt considerably- she lives across the street from her daughter   Homemaker   Completed high school   2 children, both daughters, one died at  age 48   Enjoys spending time outside, yard work.   No pets   Social Determinants of Health   Financial Resource Strain:   . Difficulty of Paying Living Expenses:   Food Insecurity:   . Worried About Charity fundraiser in the Last Year:   . Arboriculturist in the Last Year:   Transportation Needs:   . Film/video editor (Medical):   [name] Lack of Transportation (Non-Medical):   Physical Activity:   . Days of Exercise per Week:   . Minutes of Exercise per Session:   Stress:   . Feeling of Stress :   Social Connections:   . Frequency of Communication with Friends and Family:   . Frequency of Social Gatherings with Friends and Family:   . Attends Religious Services:   . Active Member of Clubs or Organizations:   . Attends Archivist Meetings:   [name] Marital Status:   Intimate Partner Violence:   . Fear of Current or Ex-Partner:   . Emotionally Abused:   [name] Physically Abused:   . Sexually Abused:     Outpatient  Medications Prior to Visit  Medication Sig Dispense Refill  . acebutolol (SECTRAL) 200 MG capsule Take 1 capsule (200 mg total) by mouth at bedtime. 90 capsule 2  . acetaminophen (TYLENOL) 500 MG tablet Take 500 mg by mouth in the morning and at bedtime.     [name] amLODipine (NORVASC) 5 MG tablet Take 1 tablet (5 mg total) by mouth every morning. 90 tablet 2  . atorvastatin (LIPITOR) 10 MG tablet Take 1 tablet (10 mg total) by mouth daily. 90 tablet 2  . gabapentin (NEURONTIN) 100 MG capsule Take 200 mg by mouth in the morning and 100 mg by mouth at bedtime (Patient taking differently: Take 100 mg by mouth 2 (two) times daily. ) 90 capsule 2  . glucose blood (TRUETRACK TEST) test strip Use as instructed to check blood sugar once a day.  Dx E11.9 100 each 3  . hydroxypropyl methylcellulose / hypromellose (ISOPTO TEARS / GONIOVISC) 2.5 % ophthalmic solution Place 1 drop into both eyes 3 (three) times daily as needed for dry eyes.    [name] levothyroxine (SYNTHROID) 100 MCG  tablet Take 1 tablet (100 mcg total) by mouth daily before breakfast. 90 tablet 2  . QUEtiapine (SEROQUEL) 25 MG tablet Take 1 tablet (25 mg total) by mouth at bedtime. 30 tablet 0  . sitaGLIPtin (JANUVIA) 50 MG tablet Take 1 tablet (50 mg total) by mouth every morning. 90 tablet 2  . telmisartan (MICARDIS) 80 MG tablet Take 1 tablet (80 mg total) by mouth daily. 90 tablet 2  . TRUEPLUS LANCETS 33G MISC Use to check blood sugar once a day. Dx E11.9 100 each 3   No facility-administered medications prior to visit.    No Known Allergies  ROS Review of Systems  Constitutional: Negative for diaphoresis, fatigue, fever and unexpected weight change.  Respiratory: Negative.   Cardiovascular: Negative.   Gastrointestinal: Negative.   Genitourinary: Positive for dysuria. Negative for difficulty urinating, frequency, hematuria and urgency.  Musculoskeletal: Positive for back pain.  Neurological: Negative for weakness and numbness.  Psychiatric/Behavioral: Negative.       Objective:    Physical Exam  Constitutional: No distress.  Pulmonary/Chest: Effort normal.  Neurological: She is alert.  Psychiatric: She has a normal mood and affect. Her behavior is normal.    Ht 5' 7"  (1.702 m)   Wt 160 lb (72.6 kg)   BMI 25.06 kg/m  Wt Readings from Last 3 Encounters:  03/01/20 160 lb (72.6 kg)  02/02/20 159 lb (72.1 kg)  01/31/20 159 lb 12.8 oz (72.5 kg)     Health Maintenance Due  Topic Date Due  . TETANUS/TDAP  Never done  . DEXA SCAN  Never done  . MAMMOGRAM  11/18/2017  . OPHTHALMOLOGY EXAM  05/19/2019    There are no preventive care reminders to display for this patient.  Lab Results  Component Value Date   TSH 1.40 01/31/2020   Lab Results  Component Value Date   WBC 5.6 02/02/2020   HGB 12.2 02/02/2020   HCT 35.7 (L) 02/02/2020   MCV 96.7 02/02/2020   PLT 175 02/02/2020   Lab Results  Component Value Date   NA 136 02/02/2020   K 3.1 (L) 02/02/2020   CHLORIDE 102  01/04/2016   CO2 22 02/02/2020   GLUCOSE 213 (H) 02/02/2020   BUN 19 02/02/2020   CREATININE 1.41 (H) 02/02/2020   BILITOT 0.3 02/02/2020   ALKPHOS 49 02/02/2020   AST 20 02/02/2020   ALT 16 02/02/2020  PROT 6.0 (L) 02/02/2020   ALBUMIN 3.2 (L) 02/02/2020   CALCIUM 8.6 (L) 02/02/2020   ANIONGAP 12 02/02/2020   EGFR 29 (L) 01/04/2016   GFR 37.13 (L) 01/31/2020   Lab Results  Component Value Date   CHOL 172 06/08/2019   Lab Results  Component Value Date   HDL 42.30 06/08/2019   Lab Results  Component Value Date   LDLCALC 93 03/12/2016   Lab Results  Component Value Date   TRIG 287.0 (H) 06/08/2019   Lab Results  Component Value Date   CHOLHDL 4 06/08/2019   Lab Results  Component Value Date   HGBA1C 6.3 01/31/2020      Assessment & Plan:   Problem List Items Addressed This Visit    None      No orders of the defined types were placed in this encounter.   Follow-up: No follow-ups on file.   U.A. looks normal. Culture is pending.    [name], MD   Virtual Visit via Telephone Note  I connected with [name] on 03/01/20 at  8:30 AM EDT by telephone and verified that I am speaking with the correct person using two identifiers.  Location: Patient: home with her daughter. Provider:   I discussed the limitations, risks, security and privacy concerns of performing an evaluation and management service by telephone and the availability of in person appointments. I also discussed with the patient that there may be a patient responsible charge related to this service. The patient expressed understanding and agreed to proceed.   History of Present Illness:    Observations/Objective:   Assessment and Plan:   Follow Up Instructions:    I discussed the assessment and treatment plan with the patient. The patient was provided an opportunity to ask questions and all were answered. The patient agreed with the plan and demonstrated an  understanding of the instructions.   The patient was advised to call back or seek an in-person evaluation if the symptoms worsen or if the condition fails to improve as anticipated.  I provided 10 minutes of non-face-to-face time during this encounter.   [name], MD

## 2020-03-02 ENCOUNTER — Ambulatory Visit: Payer: Medicare Other | Admitting: Family Medicine

## 2020-03-13 ENCOUNTER — Other Ambulatory Visit: Payer: Self-pay

## 2020-03-14 ENCOUNTER — Encounter: Payer: Self-pay | Admitting: Family Medicine

## 2020-03-14 ENCOUNTER — Ambulatory Visit (INDEPENDENT_AMBULATORY_CARE_PROVIDER_SITE_OTHER): Payer: Medicare Other | Admitting: Family Medicine

## 2020-03-14 ENCOUNTER — Ambulatory Visit: Payer: Medicare Other | Admitting: Family Medicine

## 2020-03-14 VITALS — BP 146/78 | HR 67 | Temp 98.7°F | Ht 67.0 in | Wt 158.6 lb

## 2020-03-14 DIAGNOSIS — I10 Essential (primary) hypertension: Secondary | ICD-10-CM | POA: Diagnosis not present

## 2020-03-14 DIAGNOSIS — E876 Hypokalemia: Secondary | ICD-10-CM

## 2020-03-14 DIAGNOSIS — F05 Delirium due to known physiological condition: Secondary | ICD-10-CM

## 2020-03-14 NOTE — Progress Notes (Signed)
Established Patient Office Visit  Subjective:  Patient ID: [name], female    DOB: 1919/10/11  Age: 86 y.o. MRN: [id number]  CC:  Chief Complaint  Patient presents with  . Follow-up    follow up on BP, pt would like spot on the righ arm checked.     HPI [name] presents for follow-up of her blood pressure that is been stable on her current regimen.  Patient did not tolerate the Risperdal or the Seroquel.  She is actually doing better at night staying asleep.  Was seen in the emergency room this past month secondary to adverse effects from the Covid vaccine.  Has recovered from that.  Lab work drawn at that time did show hypokalemia.  She had been seen by dermatology for the abnormal lesion on her right arm.  She was referred for surgical excision but her daughter was unable to take her because they were in quarantine for her Covid infection.  Past Medical History:  Diagnosis Date  . Anxiety    loss of child 1 year ago  . Cancer ([place])    right breast  . Diabetes mellitus, type 2 ([place])   . Glaucoma   . Gout   . Hyperlipidemia   . Hypertension    states eccho Dr Mare Ferrari 1 year ago- no record found in [facility] or office records  . Hyperthyroidism     Past Surgical History:  Procedure Laterality Date  . ABDOMINAL HYSTERECTOMY  1995  . BREAST LUMPECTOMY  11/06/2011   Procedure: LUMPECTOMY;  Surgeon: Harl Bowie, MD;  Location: [facility];  Service: General;  Laterality: Right;  . CHOLECYSTECTOMY  1976  . ROTATOR CUFF REPAIR  1999   right    Family History  Problem Relation Age of Onset  . Cancer Mother        bladder  . Kidney disease Mother   . Heart attack Brother   . Stroke Father   . Hypertension Sister   . Hyperlipidemia Daughter   . Diabetes Sister     Social History   Socioeconomic History  . Marital status: Widowed    Spouse name: Not on file  . Number of children: Not on file  . Years of education: Not on file  . Highest education level: Not  on file  Occupational History  . Not on file  Tobacco Use  . Smoking status: Never Smoker  . Smokeless tobacco: Never Used  Substance and Sexual Activity  . Alcohol use: No  . Drug use: No  . Sexual activity: Not on file  Other Topics Concern  . Not on file  Social History Narrative   Daughter helps with pt considerably- she lives across the street from her daughter   Homemaker   Completed high school   2 children, both daughters, one died at age 66   Enjoys spending time outside, yard work.   No pets   Social Determinants of Health   Financial Resource Strain:   . Difficulty of Paying Living Expenses:   Food Insecurity:   . Worried About Charity fundraiser in the Last Year:   . Arboriculturist in the Last Year:   Transportation Needs:   . Film/video editor (Medical):   [name] Lack of Transportation (Non-Medical):   Physical Activity:   . Days of Exercise per Week:   . Minutes of Exercise per Session:   Stress:   . Feeling of Stress :  Social Connections:   . Frequency of Communication with Friends and Family:   . Frequency of Social Gatherings with Friends and Family:   . Attends Religious Services:   . Active Member of Clubs or Organizations:   . Attends Archivist Meetings:   [name] Marital Status:   Intimate Partner Violence:   . Fear of Current or Ex-Partner:   . Emotionally Abused:   [name] Physically Abused:   . Sexually Abused:     Outpatient Medications Prior to Visit  Medication Sig Dispense Refill  . acebutolol (SECTRAL) 200 MG capsule Take 1 capsule (200 mg total) by mouth at bedtime. 90 capsule 2  . acetaminophen (TYLENOL) 500 MG tablet Take 500 mg by mouth in the morning and at bedtime.     [name] amLODipine (NORVASC) 5 MG tablet Take 1 tablet (5 mg total) by mouth every morning. 90 tablet 2  . atorvastatin (LIPITOR) 10 MG tablet Take 1 tablet (10 mg total) by mouth daily. 90 tablet 2  . gabapentin (NEURONTIN) 100 MG capsule Take 200 mg by mouth in  the morning and 100 mg by mouth at bedtime (Patient taking differently: Take 100 mg by mouth 2 (two) times daily. ) 90 capsule 2  . glucose blood (TRUETRACK TEST) test strip Use as instructed to check blood sugar once a day.  Dx E11.9 100 each 3  . hydroxypropyl methylcellulose / hypromellose (ISOPTO TEARS / GONIOVISC) 2.5 % ophthalmic solution Place 1 drop into both eyes 3 (three) times daily as needed for dry eyes.    [name] levothyroxine (SYNTHROID) 100 MCG tablet Take 1 tablet (100 mcg total) by mouth daily before breakfast. 90 tablet 2  . QUEtiapine (SEROQUEL) 25 MG tablet Take 1 tablet (25 mg total) by mouth at bedtime. 30 tablet 0  . sitaGLIPtin (JANUVIA) 50 MG tablet Take 1 tablet (50 mg total) by mouth every morning. 90 tablet 2  . telmisartan (MICARDIS) 80 MG tablet Take 1 tablet (80 mg total) by mouth daily. 90 tablet 2  . TRUEPLUS LANCETS 33G MISC Use to check blood sugar once a day. Dx E11.9 100 each 3   No facility-administered medications prior to visit.    No Known Allergies  ROS Review of Systems  Constitutional: Negative.   HENT: Negative.   Respiratory: Negative.   Cardiovascular: Negative.   Gastrointestinal: Negative.   Skin: Positive for color change and wound.  Psychiatric/Behavioral: Negative for behavioral problems and sleep disturbance.      Objective:    Physical Exam  Constitutional: She appears well-developed and well-nourished. No distress.  HENT:  Head: Normocephalic and atraumatic.  Right Ear: External ear normal.  Left Ear: External ear normal.  Eyes: Conjunctivae are normal. Right eye exhibits no discharge. Left eye exhibits no discharge. No scleral icterus.  Neck: No JVD present. No tracheal deviation present.  Cardiovascular: Normal rate, regular rhythm and normal heart sounds.  Pulmonary/Chest: Effort normal and breath sounds normal. No stridor.  Neurological: She is alert.  Skin: Skin is warm and dry. She is not diaphoretic.     Psychiatric:  She has a normal mood and affect. Her behavior is normal.    BP (!) 146/78   Pulse 67   Temp 98.7 F (37.1 C) (Tympanic)   Ht '5\' 7"'$  (1.702 m)   Wt 158 lb 9.6 oz (71.9 kg)   SpO2 97%   BMI 24.84 kg/m  Wt Readings from Last 3 Encounters:  03/14/20 158 lb 9.6 oz (71.9 kg)  03/01/20 160 lb (72.6 kg)  02/02/20 159 lb (72.1 kg)     Health Maintenance Due  Topic Date Due  . COVID-19 Vaccine (1) Never done  . TETANUS/TDAP  Never done  . DEXA SCAN  Never done  . MAMMOGRAM  11/18/2017  . OPHTHALMOLOGY EXAM  05/19/2019    There are no preventive care reminders to display for this patient.  Lab Results  Component Value Date   TSH 1.40 01/31/2020   Lab Results  Component Value Date   WBC 5.6 02/02/2020   HGB 12.2 02/02/2020   HCT 35.7 (L) 02/02/2020   MCV 96.7 02/02/2020   PLT 175 02/02/2020   Lab Results  Component Value Date   NA 136 02/02/2020   K 3.1 (L) 02/02/2020   CHLORIDE 102 01/04/2016   CO2 22 02/02/2020   GLUCOSE 213 (H) 02/02/2020   BUN 19 02/02/2020   CREATININE 1.41 (H) 02/02/2020   BILITOT 0.3 02/02/2020   ALKPHOS 49 02/02/2020   AST 20 02/02/2020   ALT 16 02/02/2020   PROT 6.0 (L) 02/02/2020   ALBUMIN 3.2 (L) 02/02/2020   CALCIUM 8.6 (L) 02/02/2020   ANIONGAP 12 02/02/2020   EGFR 29 (L) 01/04/2016   GFR 37.13 (L) 01/31/2020   Lab Results  Component Value Date   CHOL 172 06/08/2019   Lab Results  Component Value Date   HDL 42.30 06/08/2019   Lab Results  Component Value Date   LDLCALC 93 03/12/2016   Lab Results  Component Value Date   TRIG 287.0 (H) 06/08/2019   Lab Results  Component Value Date   CHOLHDL 4 06/08/2019   Lab Results  Component Value Date   HGBA1C 6.3 01/31/2020      Assessment & Plan:   Problem List Items Addressed This Visit      Cardiovascular and Mediastinum   Essential hypertension   Relevant Orders   Basic metabolic panel     Other   Hypokalemia   Relevant Orders   Basic metabolic panel    SunDown syndrome - Primary      No orders of the defined types were placed in this encounter.   Follow-up: Return in about 3 months (around 06/13/2020), or if symptoms worsen or fail to improve.  Follow-up hypokalemia today with a BMP.  Daughter will call the dermatologist for another referral to general surgery for excision of the lesion on her arm.  Blood pressure is stable.  [name], MD

## 2020-03-15 LAB — BASIC METABOLIC PANEL
BUN: 36 mg/dL — ABNORMAL HIGH (ref 6–23)
CO2: 24 mEq/L (ref 19–32)
Calcium: 9.3 mg/dL (ref 8.4–10.5)
Chloride: 104 mEq/L (ref 96–112)
Creatinine, Ser: 1.94 mg/dL — ABNORMAL HIGH (ref 0.40–1.20)
GFR: 23.8 mL/min — ABNORMAL LOW (ref >60.00)
Glucose, Bld: 114 mg/dL — ABNORMAL HIGH (ref 70–99)
Potassium: 4.4 mEq/L (ref 3.5–5.1)
Sodium: 138 mEq/L (ref 135–145)

## 2020-03-16 DIAGNOSIS — C44612 Basal cell carcinoma of skin of right upper limb, including shoulder: Secondary | ICD-10-CM | POA: Diagnosis not present

## 2020-03-16 DIAGNOSIS — C44319 Basal cell carcinoma of skin of other parts of face: Secondary | ICD-10-CM | POA: Diagnosis not present

## 2020-05-26 ENCOUNTER — Emergency Department (HOSPITAL_COMMUNITY)
Admission: EM | Admit: 2020-05-26 | Discharge: 2020-05-26 | Disposition: A | Discharge status: Home / Self Care | Payer: Medicare Other | Attending: Emergency Medicine | Admitting: Emergency Medicine

## 2020-05-26 ENCOUNTER — Other Ambulatory Visit: Payer: Self-pay

## 2020-05-26 ENCOUNTER — Encounter (HOSPITAL_COMMUNITY): Payer: Self-pay

## 2020-05-26 ENCOUNTER — Emergency Department (HOSPITAL_COMMUNITY): Payer: Medicare Other

## 2020-05-26 DIAGNOSIS — I6782 Cerebral ischemia: Secondary | ICD-10-CM | POA: Diagnosis not present

## 2020-05-26 DIAGNOSIS — Z853 Personal history of malignant neoplasm of breast: Secondary | ICD-10-CM | POA: Diagnosis not present

## 2020-05-26 DIAGNOSIS — I129 Hypertensive chronic kidney disease with stage 1 through stage 4 chronic kidney disease, or unspecified chronic kidney disease: Secondary | ICD-10-CM | POA: Diagnosis not present

## 2020-05-26 DIAGNOSIS — I440 Atrioventricular block, first degree: Secondary | ICD-10-CM | POA: Diagnosis not present

## 2020-05-26 DIAGNOSIS — R569 Unspecified convulsions: Secondary | ICD-10-CM | POA: Diagnosis not present

## 2020-05-26 DIAGNOSIS — R55 Syncope and collapse: Secondary | ICD-10-CM | POA: Diagnosis not present

## 2020-05-26 DIAGNOSIS — N1831 Chronic kidney disease, stage 3a: Secondary | ICD-10-CM | POA: Insufficient documentation

## 2020-05-26 DIAGNOSIS — E039 Hypothyroidism, unspecified: Secondary | ICD-10-CM | POA: Insufficient documentation

## 2020-05-26 DIAGNOSIS — F0390 Unspecified dementia without behavioral disturbance: Secondary | ICD-10-CM | POA: Diagnosis not present

## 2020-05-26 DIAGNOSIS — Z7984 Long term (current) use of oral hypoglycemic drugs: Secondary | ICD-10-CM | POA: Diagnosis not present

## 2020-05-26 DIAGNOSIS — G319 Degenerative disease of nervous system, unspecified: Secondary | ICD-10-CM | POA: Diagnosis not present

## 2020-05-26 DIAGNOSIS — R9431 Abnormal electrocardiogram [ECG] [EKG]: Secondary | ICD-10-CM | POA: Diagnosis not present

## 2020-05-26 DIAGNOSIS — G40909 Epilepsy, unspecified, not intractable, without status epilepticus: Secondary | ICD-10-CM

## 2020-05-26 DIAGNOSIS — E119 Type 2 diabetes mellitus without complications: Secondary | ICD-10-CM | POA: Diagnosis not present

## 2020-05-26 DIAGNOSIS — I499 Cardiac arrhythmia, unspecified: Secondary | ICD-10-CM | POA: Diagnosis not present

## 2020-05-26 DIAGNOSIS — S0990XA Unspecified injury of head, initial encounter: Secondary | ICD-10-CM | POA: Diagnosis not present

## 2020-05-26 DIAGNOSIS — R404 Transient alteration of awareness: Secondary | ICD-10-CM | POA: Diagnosis not present

## 2020-05-26 DIAGNOSIS — G9389 Other specified disorders of brain: Secondary | ICD-10-CM | POA: Diagnosis not present

## 2020-05-26 DIAGNOSIS — E1165 Type 2 diabetes mellitus with hyperglycemia: Secondary | ICD-10-CM | POA: Diagnosis not present

## 2020-05-26 LAB — BASIC METABOLIC PANEL
Anion gap: 9 (ref 5–15)
BUN: 21 mg/dL (ref 8–23)
CO2: 26 mmol/L (ref 22–32)
Calcium: 9.4 mg/dL (ref 8.9–10.3)
Chloride: 103 mmol/L (ref 98–111)
Creatinine, Ser: 1.46 mg/dL — ABNORMAL HIGH (ref 0.44–1.00)
GFR calc Af Amer: 34 mL/min — ABNORMAL LOW (ref >60)
GFR calc non Af Amer: 30 mL/min — ABNORMAL LOW (ref >60)
Glucose, Bld: 239 mg/dL — ABNORMAL HIGH (ref 70–99)
Potassium: 3.9 mmol/L (ref 3.5–5.1)
Sodium: 138 mmol/L (ref 135–145)

## 2020-05-26 LAB — CBC
HCT: 39.7 % (ref 36.0–46.0)
Hemoglobin: 13.7 g/dL (ref 12.0–15.0)
MCH: 33.2 pg (ref 26.0–34.0)
MCHC: 34.5 g/dL (ref 30.0–36.0)
MCV: 96.1 fL (ref 80.0–100.0)
Platelets: 218 10*3/uL (ref 150–400)
RBC: 4.13 MIL/uL (ref 3.87–5.11)
RDW: 12 % (ref 11.5–15.5)
WBC: 11.8 10*3/uL — ABNORMAL HIGH (ref 4.0–10.5)
nRBC: 0 % (ref 0.0–0.2)

## 2020-05-26 LAB — CBG MONITORING, ED: Glucose-Capillary: 234 mg/dL — ABNORMAL HIGH (ref 70–99)

## 2020-05-26 MED ORDER — LEVETIRACETAM 250 MG PO TABS
250.0000 mg | ORAL_TABLET | Freq: Once | ORAL | Status: AC
  Administered 2020-05-26: 250 mg via ORAL
  Filled 2020-05-26: qty 1

## 2020-05-26 MED ORDER — LEVETIRACETAM 250 MG PO TABS
250.0000 mg | ORAL_TABLET | Freq: Two times a day (BID) | ORAL | 0 refills | Status: DC
Start: 2020-05-26 — End: 2020-06-08

## 2020-05-26 NOTE — ED Notes (Signed)
CT dept. was contacted & was given a ETA for them to transport pt to CT.

## 2020-05-26 NOTE — ED Provider Notes (Signed)
Head CT without acute findings.  Patient will be discharged   Lacretia Leigh, MD 05/26/20 1843

## 2020-05-26 NOTE — Discharge Instructions (Signed)
Please take Keppra 250 mg twice a day, you will need to follow-up with the neurologist within the week.  Your testing today has been reassuring, I suspect you are having seizures and the neurologist agreed and wanted you to start this medication.  If you continue to have abnormal mental status seizures or continue to have falls you should return to the emergency department immediately.

## 2020-05-26 NOTE — ED Provider Notes (Signed)
[facility] EMERGENCY DEPARTMENT Provider Note   CSN: [id number] Arrival date & time: 05/26/20  1011     History Chief Complaint  Patient presents with  . Syncope    [name] is a 86 y.o. female.  HPI   This patient is a 86 year old female, history of type 2 diabetes, right-sided breast cancer and then hyperthyroidism.  She has no prior history of cardiac or cerebral disease.  There is a comment in the notes that she does have some dementia and has had sundowning syndrome.  According to the paramedics that transported the patient to the hospital she lives with her daughter, she had been in the bathroom for a long period of time and was found to be unresponsive when the daughter went in finding her slumped over on the commode.  Evidently she was unresponsive, when fire personnel arrived to the scene they took her off of the commode put her on the stretcher and put her into the truck and according to the paramedic she became more responsive and back to normal by the time she arrived.  Evidently the patient had some diarrhea.  The patient does not recall having any recent diarrhea, she recalls having a normal breakfast this morning including coffee and cereal.  She denies chest pain, palpitations, abdominal pain, headache, changes in vision or any other complaints at this time.  Level 5 caveat applies secondary to some dementia  Past Medical History:  Diagnosis Date  . Anxiety    loss of child 1 year ago  . Cancer ([place])    right breast  . Diabetes mellitus, type 2 ([place])   . Glaucoma   . Gout   . Hyperlipidemia   . Hypertension    states eccho Dr Mare Ferrari 1 year ago- no record found in [facility] or office records  . Hyperthyroidism     Patient Active Problem List   Diagnosis Date Noted  . Dementia with behavioral disturbance ([place]) 12/01/2019  . 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 Open wound of arm with complication, right, initial encounter 08/13/2019  .  Need for influenza vaccination 08/13/2019  . Hypochloremia 07/13/2019  . Syncope 06/24/2019  . Hyponatremia   . Pain due to onychomycosis of toenails of both feet 05/18/2019  . Diabetic neuropathy ([place]) 05/18/2019  . Macrocytic anemia 12/08/2018  . Dehydration 08/06/2018  . Hospital discharge follow-up 08/06/2018  . Abnormal liver function   . Acute lower UTI 07/31/2018  . Sepsis ([place]) 07/31/2018  . Hypokalemia 07/31/2018  . AKI (acute kidney injury) ([place]) 07/31/2018  . CKD (chronic kidney disease) stage 3, GFR 30-59 ml/min 07/09/2018  . Urinary frequency 04/14/2018  . Hyperlipidemia 04/08/2017  . Right knee pain 12/10/2016  . Osteoarthritis 04/23/2016  . Peripheral neuropathy 04/23/2016  . Joint pain, hip 06/22/2015  . Skin lesion of face 06/10/2013  . General medical examination 10/30/2011  . Cancer of overlapping sites of right female breast ([place]) 10/09/2011  . Hypothyroidism 12/13/2010  . Diabetes mellitus type 2, noninsulin dependent ([place]) 12/13/2010  . Gout 12/13/2010  . ANXIETY 12/13/2010  . Essential hypertension 12/13/2010    Past Surgical History:  Procedure Laterality Date  . ABDOMINAL HYSTERECTOMY  1995  . BREAST LUMPECTOMY  11/06/2011   Procedure: LUMPECTOMY;  Surgeon: Harl Bowie, MD;  Location: [facility];  Service: General;  Laterality: Right;  . CHOLECYSTECTOMY  1976  . [place]   right     OB History   No  obstetric history on file.     Family History  Problem Relation Age of Onset  . Cancer Mother        bladder  . Kidney disease Mother   . Heart attack Brother   . Stroke Father   . Hypertension Sister   . Hyperlipidemia Daughter   . Diabetes Sister     Social History   Tobacco Use  . Smoking status: Never Smoker  . Smokeless tobacco: Never Used  Substance Use Topics  . Alcohol use: No  . Drug use: No    Home Medications Prior to Admission medications   Medication Sig Start Date End Date Taking? Authorizing  Provider  acebutolol (SECTRAL) 200 MG capsule Take 1 capsule (200 mg total) by mouth at bedtime. 10/20/19   [name], MD  acetaminophen (TYLENOL) 500 MG tablet Take 500 mg by mouth in the morning and at bedtime.     [provider]  amLODipine (NORVASC) 5 MG tablet Take 1 tablet (5 mg total) by mouth every morning. 10/20/19   [name], MD  atorvastatin (LIPITOR) 10 MG tablet Take 1 tablet (10 mg total) by mouth daily. 10/20/19   [name], MD  gabapentin (NEURONTIN) 100 MG capsule Take 200 mg by mouth in the morning and 100 mg by mouth at bedtime Patient taking differently: Take 100 mg by mouth 2 (two) times daily.  10/20/19   [name], MD  glucose blood (TRUETRACK TEST) test strip Use as instructed to check blood sugar once a day.  Dx E11.9 12/29/18   [name], MD  hydroxypropyl methylcellulose / hypromellose (ISOPTO TEARS / GONIOVISC) 2.5 % ophthalmic solution Place 1 drop into both eyes 3 (three) times daily as needed for dry eyes.    [provider]  levETIRAcetam (KEPPRA) 250 MG tablet Take 1 tablet (250 mg total) by mouth 2 (two) times daily. 05/26/20 06/25/20  Noemi Chapel, MD  levothyroxine (SYNTHROID) 100 MCG tablet Take 1 tablet (100 mcg total) by mouth daily before breakfast. 10/20/19   [name], MD  QUEtiapine (SEROQUEL) 25 MG tablet Take 1 tablet (25 mg total) by mouth at bedtime. 02/03/20   [name], MD  sitaGLIPtin (JANUVIA) 50 MG tablet Take 1 tablet (50 mg total) by mouth every morning. 10/20/19   [name], MD  telmisartan (MICARDIS) 80 MG tablet Take 1 tablet (80 mg total) by mouth daily. 10/20/19   [name], MD  TRUEPLUS LANCETS 33G MISC Use to check blood sugar once a day. Dx E11.9 12/29/18   [name], MD  glimepiride (AMARYL) 1 MG tablet Take 1 tablet (1 mg total) by mouth every morning. Patient not taking: Reported on 02/02/2020 10/20/19  02/03/20  [name], MD  risperiDONE (RISPERDAL) 0.5 MG tablet Take 1 tablet (0.5 mg total) by mouth at bedtime. Patient not taking: Reported on 02/02/2020 01/31/20 02/03/20  [name], MD    Allergies    Patient has no known allergies.  Review of Systems   Review of Systems  Unable to perform ROS: Dementia    Physical Exam Updated Vital Signs BP (!) 153/129   Pulse 66   Resp 17   SpO2 95%   Physical Exam Vitals and nursing note reviewed.  Constitutional:      General: She is not in acute distress.    Appearance: She is well-developed.  HENT:     Head: Normocephalic and atraumatic.  Comments: Excisional biopsy to the right anterior forehead laterally, no active bleeding, no swelling    Mouth/Throat:     Mouth: Mucous membranes are moist.     Pharynx: No oropharyngeal exudate.  Eyes:     General: No scleral icterus.       Right eye: No discharge.        Left eye: No discharge.     Conjunctiva/sclera: Conjunctivae normal.     Pupils: Pupils are equal, round, and reactive to light.  Neck:     Thyroid: No thyromegaly.     Vascular: No JVD.  Cardiovascular:     Rate and Rhythm: Normal rate and regular rhythm.     Heart sounds: Normal heart sounds. No murmur heard.  No friction rub. No gallop.   Pulmonary:     Effort: Pulmonary effort is normal. No respiratory distress.     Breath sounds: Normal breath sounds. No wheezing or rales.  Abdominal:     General: Bowel sounds are normal. There is no distension.     Palpations: Abdomen is soft. There is no mass.     Tenderness: There is no abdominal tenderness.  Musculoskeletal:        General: No tenderness. Normal range of motion.     Cervical back: Normal range of motion and neck supple.     Comments: No obvious deformity of the 4 extremities, full range of motion, supple joints and soft compartments diffusely.  No obvious deformities  Lymphadenopathy:     Cervical: No cervical adenopathy.   Skin:    General: Skin is warm and dry.     Findings: No erythema or rash.  Neurological:     Mental Status: She is alert.     Coordination: Coordination normal.     Comments: The patient is awake alert and able to follow commands, she has no difficulty with using any of her 4 extremities.  Her speech is clear and answers questions appropriately though she does have some memory issues  Psychiatric:        Behavior: Behavior normal.     ED Results / Procedures / Treatments   Labs (all labs ordered are listed, but only abnormal results are displayed) Labs Reviewed  CBC - Abnormal; Notable for the following components:      Result Value   WBC 11.8 (*)    All other components within normal limits  BASIC METABOLIC PANEL - Abnormal; Notable for the following components:   Glucose, Bld 239 (*)    Creatinine, Ser 1.46 (*)    GFR calc non Af Amer 30 (*)    GFR calc Af Amer 34 (*)    All other components within normal limits  CBG MONITORING, ED - Abnormal; Notable for the following components:   Glucose-Capillary 234 (*)    All other components within normal limits    EKG EKG Interpretation  Date/Time:  Friday May 26 2020 10:22:53 EDT Ventricular Rate:  64 PR Interval:    QRS Duration: 98 QT Interval:  446 QTC Calculation: 461 R Axis:   -61 Text Interpretation: Incomplete analysis due to missing data in precordial lead(s) Sinus rhythm Prolonged PR interval LVH with secondary repolarization abnormality Probable anterior infarct, age indeterminate since last tracing no significant change Confirmed by Noemi Chapel [phone]) on 05/26/2020 10:35:12 AM   Radiology No results found.  Procedures Procedures (including critical care time)  Medications Ordered in ED Medications  levETIRAcetam (KEPPRA) tablet 250 mg (has no administration in time  range)    ED Course  I have reviewed the triage vital signs and the nursing notes.  Pertinent labs & imaging results that were available  during my care of the patient were reviewed by me and considered in my medical decision making (see chart for details).    MDM Rules/Calculators/A&P                          There is no apparent fall off of the commode, she has back to her baseline mental status.  Awaiting family's arrival.  Glucose is 234, EKG does not reveal any arrhythmias of concern.  Possible vagal event, would also consider primary arrhythmia given age, does not have any infectious symptoms and has no active ischemic symptoms  The lab work is rather unremarkable, I have discussed this with the patient and her daughter who is now arrived.  Evidently the patient has had several episodes of falls and abnormal behavior including being found on the ground and puddles of her own urine and stool with blood.  She has had a CT scan as recently as approximately 1 year ago which was negative for intracranial findings.  Today the daughter found her on the commode after a bowel movement.  She was unresponsive with repetitive blinking movements and with her right hand she was grasping at the air repetitively during time when she was unresponsive.  I suspect this patient is having seizures, I will discuss with neurology, obtain a CT scan of the brain, they recommended Keppra 250 bid and CT scan and outpt f/u - no need for admission.  Family informed, At change of shift - care signed out to Dr. Zenia Resides - to follow up results and disposition accordingly - home if CT negative - Keppra 250 bid and f/u with Neuro  Final Clinical Impression(s) / ED Diagnoses Final diagnoses:  Seizure disorder [facility])    Rx / DC Orders ED Discharge Orders         Ordered    levETIRAcetam (KEPPRA) 250 MG tablet  2 times daily     Discontinue  Reprint     05/26/20 1514           Noemi Chapel, MD 05/26/20 1515

## 2020-05-26 NOTE — ED Triage Notes (Signed)
Pt lives at home with her daughter & EMS reports that pt's daughter called 911 when she found pt sitting on the toilet slumped over & unresponsive. Pt had been having diarrhea.  EMS reports, pt became most alert upon arrival to ED. Upon arrival pt is Alert to self & location. She is unaware why she is here, she is a good historian. Speaks clearly, able to make needs known.

## 2020-05-26 NOTE — ED Notes (Signed)
Dressing from previous skin cancer removal that ED took off has been replaced with. Non-adhesive dressing to wound surface, padded with 2 folded 4x4 gauze & then taped with a protective wrap around her head to keep in place.

## 2020-05-29 ENCOUNTER — Observation Stay (HOSPITAL_COMMUNITY): Payer: Medicare Other

## 2020-05-29 ENCOUNTER — Emergency Department (HOSPITAL_COMMUNITY): Payer: Medicare Other

## 2020-05-29 ENCOUNTER — Encounter: Payer: Self-pay | Admitting: Family Medicine

## 2020-05-29 ENCOUNTER — Inpatient Hospital Stay (HOSPITAL_COMMUNITY)
Admission: EM | Admit: 2020-05-29 | Discharge: 2020-06-08 | DRG: 101 | Disposition: A | Discharge status: Skilled Nursing Facility | Payer: Medicare Other | Attending: Internal Medicine | Admitting: Internal Medicine

## 2020-05-29 ENCOUNTER — Ambulatory Visit (INDEPENDENT_AMBULATORY_CARE_PROVIDER_SITE_OTHER): Payer: Medicare Other | Admitting: Family Medicine

## 2020-05-29 ENCOUNTER — Other Ambulatory Visit: Payer: Self-pay

## 2020-05-29 ENCOUNTER — Encounter (HOSPITAL_COMMUNITY): Payer: Self-pay | Admitting: Emergency Medicine

## 2020-05-29 VITALS — BP 102/64 | HR 60 | Temp 97.5°F | Ht 67.0 in | Wt 165.0 lb

## 2020-05-29 DIAGNOSIS — R404 Transient alteration of awareness: Secondary | ICD-10-CM

## 2020-05-29 DIAGNOSIS — E1142 Type 2 diabetes mellitus with diabetic polyneuropathy: Secondary | ICD-10-CM | POA: Diagnosis present

## 2020-05-29 DIAGNOSIS — I6782 Cerebral ischemia: Secondary | ICD-10-CM | POA: Diagnosis not present

## 2020-05-29 DIAGNOSIS — R4182 Altered mental status, unspecified: Secondary | ICD-10-CM | POA: Diagnosis not present

## 2020-05-29 DIAGNOSIS — E1122 Type 2 diabetes mellitus with diabetic chronic kidney disease: Secondary | ICD-10-CM | POA: Diagnosis present

## 2020-05-29 DIAGNOSIS — I447 Left bundle-branch block, unspecified: Secondary | ICD-10-CM | POA: Diagnosis not present

## 2020-05-29 DIAGNOSIS — R55 Syncope and collapse: Secondary | ICD-10-CM | POA: Diagnosis not present

## 2020-05-29 DIAGNOSIS — G319 Degenerative disease of nervous system, unspecified: Secondary | ICD-10-CM | POA: Diagnosis not present

## 2020-05-29 DIAGNOSIS — R569 Unspecified convulsions: Secondary | ICD-10-CM

## 2020-05-29 DIAGNOSIS — Z7189 Other specified counseling: Secondary | ICD-10-CM

## 2020-05-29 DIAGNOSIS — Z79890 Hormone replacement therapy (postmenopausal): Secondary | ICD-10-CM

## 2020-05-29 DIAGNOSIS — F0391 Unspecified dementia with behavioral disturbance: Secondary | ICD-10-CM | POA: Diagnosis not present

## 2020-05-29 DIAGNOSIS — E119 Type 2 diabetes mellitus without complications: Secondary | ICD-10-CM

## 2020-05-29 DIAGNOSIS — Z90710 Acquired absence of both cervix and uterus: Secondary | ICD-10-CM

## 2020-05-29 DIAGNOSIS — R32 Unspecified urinary incontinence: Secondary | ICD-10-CM | POA: Diagnosis present

## 2020-05-29 DIAGNOSIS — Z20822 Contact with and (suspected) exposure to covid-19: Secondary | ICD-10-CM | POA: Diagnosis not present

## 2020-05-29 DIAGNOSIS — N183 Chronic kidney disease, stage 3 unspecified: Secondary | ICD-10-CM | POA: Diagnosis present

## 2020-05-29 DIAGNOSIS — R4189 Other symptoms and signs involving cognitive functions and awareness: Secondary | ICD-10-CM | POA: Diagnosis not present

## 2020-05-29 DIAGNOSIS — G4089 Other seizures: Secondary | ICD-10-CM | POA: Diagnosis not present

## 2020-05-29 DIAGNOSIS — R296 Repeated falls: Secondary | ICD-10-CM | POA: Diagnosis present

## 2020-05-29 DIAGNOSIS — E039 Hypothyroidism, unspecified: Secondary | ICD-10-CM

## 2020-05-29 DIAGNOSIS — N1832 Chronic kidney disease, stage 3b: Secondary | ICD-10-CM | POA: Diagnosis not present

## 2020-05-29 DIAGNOSIS — Z853 Personal history of malignant neoplasm of breast: Secondary | ICD-10-CM

## 2020-05-29 DIAGNOSIS — N179 Acute kidney failure, unspecified: Secondary | ICD-10-CM | POA: Diagnosis not present

## 2020-05-29 DIAGNOSIS — Z79899 Other long term (current) drug therapy: Secondary | ICD-10-CM

## 2020-05-29 DIAGNOSIS — E1165 Type 2 diabetes mellitus with hyperglycemia: Secondary | ICD-10-CM | POA: Diagnosis present

## 2020-05-29 DIAGNOSIS — Z66 Do not resuscitate: Secondary | ICD-10-CM | POA: Diagnosis not present

## 2020-05-29 DIAGNOSIS — D329 Benign neoplasm of meninges, unspecified: Secondary | ICD-10-CM | POA: Diagnosis not present

## 2020-05-29 DIAGNOSIS — E785 Hyperlipidemia, unspecified: Secondary | ICD-10-CM | POA: Diagnosis present

## 2020-05-29 DIAGNOSIS — R52 Pain, unspecified: Secondary | ICD-10-CM | POA: Diagnosis not present

## 2020-05-29 DIAGNOSIS — I10 Essential (primary) hypertension: Secondary | ICD-10-CM

## 2020-05-29 DIAGNOSIS — Z09 Encounter for follow-up examination after completed treatment for conditions other than malignant neoplasm: Secondary | ICD-10-CM | POA: Diagnosis not present

## 2020-05-29 DIAGNOSIS — I129 Hypertensive chronic kidney disease with stage 1 through stage 4 chronic kidney disease, or unspecified chronic kidney disease: Secondary | ICD-10-CM | POA: Diagnosis present

## 2020-05-29 DIAGNOSIS — R197 Diarrhea, unspecified: Secondary | ICD-10-CM | POA: Diagnosis present

## 2020-05-29 DIAGNOSIS — R159 Full incontinence of feces: Secondary | ICD-10-CM | POA: Diagnosis present

## 2020-05-29 DIAGNOSIS — Z7984 Long term (current) use of oral hypoglycemic drugs: Secondary | ICD-10-CM

## 2020-05-29 DIAGNOSIS — F03918 Unspecified dementia, unspecified severity, with other behavioral disturbance: Secondary | ICD-10-CM | POA: Diagnosis present

## 2020-05-29 DIAGNOSIS — E876 Hypokalemia: Secondary | ICD-10-CM | POA: Diagnosis present

## 2020-05-29 DIAGNOSIS — R400 Somnolence: Secondary | ICD-10-CM

## 2020-05-29 HISTORY — DX: Other specified counseling: Z71.89

## 2020-05-29 LAB — URINALYSIS, ROUTINE W REFLEX MICROSCOPIC
Bilirubin Urine: NEGATIVE
Glucose, UA: NEGATIVE mg/dL
Ketones, ur: NEGATIVE mg/dL
Leukocytes,Ua: NEGATIVE
Nitrite: NEGATIVE
Protein, ur: 100 mg/dL — AB
Specific Gravity, Urine: 1.009 (ref 1.005–1.030)
pH: 5 (ref 5.0–8.0)

## 2020-05-29 LAB — GLUCOSE, CAPILLARY: Glucose-Capillary: 142 mg/dL — ABNORMAL HIGH (ref 70–99)

## 2020-05-29 LAB — CBC WITH DIFFERENTIAL/PLATELET
Abs Immature Granulocytes: 0.05 10*3/uL (ref 0.00–0.07)
Basophils Absolute: 0 10*3/uL (ref 0.0–0.1)
Basophils Relative: 0 %
Eosinophils Absolute: 0.1 10*3/uL (ref 0.0–0.5)
Eosinophils Relative: 1 %
HCT: 34.2 % — ABNORMAL LOW (ref 36.0–46.0)
Hemoglobin: 11.6 g/dL — ABNORMAL LOW (ref 12.0–15.0)
Immature Granulocytes: 1 %
Lymphocytes Relative: 16 %
Lymphs Abs: 1.5 10*3/uL (ref 0.7–4.0)
MCH: 32.8 pg (ref 26.0–34.0)
MCHC: 33.9 g/dL (ref 30.0–36.0)
MCV: 96.6 fL (ref 80.0–100.0)
Monocytes Absolute: 0.8 10*3/uL (ref 0.1–1.0)
Monocytes Relative: 9 %
Neutro Abs: 7 10*3/uL (ref 1.7–7.7)
Neutrophils Relative %: 73 %
Platelets: 196 10*3/uL (ref 150–400)
RBC: 3.54 MIL/uL — ABNORMAL LOW (ref 3.87–5.11)
RDW: 11.9 % (ref 11.5–15.5)
WBC: 9.5 10*3/uL (ref 4.0–10.5)
nRBC: 0 % (ref 0.0–0.2)

## 2020-05-29 LAB — SARS CORONAVIRUS 2 BY RT PCR (HOSPITAL ORDER, PERFORMED IN ~~LOC~~ HOSPITAL LAB): SARS Coronavirus 2: NEGATIVE

## 2020-05-29 LAB — COMPREHENSIVE METABOLIC PANEL
ALT: 11 U/L (ref 0–44)
AST: 13 U/L — ABNORMAL LOW (ref 15–41)
Albumin: 2.9 g/dL — ABNORMAL LOW (ref 3.5–5.0)
Alkaline Phosphatase: 56 U/L (ref 38–126)
Anion gap: 13 (ref 5–15)
BUN: 34 mg/dL — ABNORMAL HIGH (ref 8–23)
CO2: 22 mmol/L (ref 22–32)
Calcium: 8.6 mg/dL — ABNORMAL LOW (ref 8.9–10.3)
Chloride: 100 mmol/L (ref 98–111)
Creatinine, Ser: 2.26 mg/dL — ABNORMAL HIGH (ref 0.44–1.00)
GFR calc Af Amer: 20 mL/min — ABNORMAL LOW (ref >60)
GFR calc non Af Amer: 17 mL/min — ABNORMAL LOW (ref >60)
Glucose, Bld: 194 mg/dL — ABNORMAL HIGH (ref 70–99)
Potassium: 3.5 mmol/L (ref 3.5–5.1)
Sodium: 135 mmol/L (ref 135–145)
Total Bilirubin: 0.6 mg/dL (ref 0.3–1.2)
Total Protein: 5.9 g/dL — ABNORMAL LOW (ref 6.5–8.1)

## 2020-05-29 LAB — CBG MONITORING, ED
Glucose-Capillary: 169 mg/dL — ABNORMAL HIGH (ref 70–99)
Glucose-Capillary: 158 mg/dL — ABNORMAL HIGH (ref 70–99)
Glucose-Capillary: 141 mg/dL — ABNORMAL HIGH (ref 70–99)

## 2020-05-29 LAB — TSH: TSH: 2.886 u[IU]/mL (ref 0.350–4.500)

## 2020-05-29 LAB — AMMONIA: Ammonia: 22 umol/L (ref 9–35)

## 2020-05-29 MED ORDER — POLYETHYLENE GLYCOL 3350 17 G PO PACK: 17.0000 g | PACK | Freq: Every day | ORAL | Status: DC | PRN

## 2020-05-29 MED ORDER — ACETAMINOPHEN 650 MG RE SUPP: 650.0000 mg | Freq: Four times a day (QID) | RECTAL | Status: DC | PRN

## 2020-05-29 MED ORDER — ACETAMINOPHEN 325 MG PO TABS: 650.0000 mg | ORAL_TABLET | Freq: Four times a day (QID) | ORAL | Status: DC | PRN

## 2020-05-29 MED ORDER — MORPHINE SULFATE (PF) 2 MG/ML IV SOLN: 2.0000 mg | INTRAVENOUS | Status: DC | PRN

## 2020-05-29 MED ORDER — INSULIN ASPART 100 UNIT/ML ~~LOC~~ SOLN
0.0000 [IU] | Freq: Every day | SUBCUTANEOUS | Status: DC
  Administered 2020-06-06: 3 [IU] via SUBCUTANEOUS

## 2020-05-29 MED ORDER — SODIUM CHLORIDE 0.9 % IV SOLN
75.0000 mL/h | INTRAVENOUS | Status: DC
  Administered 2020-05-29: 75 mL/h via INTRAVENOUS

## 2020-05-29 MED ORDER — ENOXAPARIN SODIUM 30 MG/0.3ML ~~LOC~~ SOLN
30.0000 mg | SUBCUTANEOUS | Status: DC
  Administered 2020-05-30 – 2020-06-07 (×9): 30 mg via SUBCUTANEOUS
  Filled 2020-05-29 (×9): qty 0.3

## 2020-05-29 MED ORDER — INSULIN ASPART 100 UNIT/ML ~~LOC~~ SOLN
0.0000 [IU] | Freq: Three times a day (TID) | SUBCUTANEOUS | Status: DC
  Administered 2020-05-30: 3 [IU] via SUBCUTANEOUS
  Administered 2020-05-30 – 2020-05-31 (×2): 2 [IU] via SUBCUTANEOUS
  Administered 2020-05-31: 3 [IU] via SUBCUTANEOUS
  Administered 2020-05-31: 2 [IU] via SUBCUTANEOUS
  Administered 2020-06-01: 3 [IU] via SUBCUTANEOUS
  Administered 2020-06-01: 5 [IU] via SUBCUTANEOUS
  Administered 2020-06-01 – 2020-06-02 (×2): 2 [IU] via SUBCUTANEOUS
  Administered 2020-06-02: 5 [IU] via SUBCUTANEOUS
  Administered 2020-06-02: 3 [IU] via SUBCUTANEOUS
  Administered 2020-06-03: 2 [IU] via SUBCUTANEOUS
  Administered 2020-06-03: 3 [IU] via SUBCUTANEOUS
  Administered 2020-06-03: 2 [IU] via SUBCUTANEOUS
  Administered 2020-06-04: 3 [IU] via SUBCUTANEOUS
  Administered 2020-06-04: 2 [IU] via SUBCUTANEOUS
  Administered 2020-06-04: 3 [IU] via SUBCUTANEOUS
  Administered 2020-06-05: 5 [IU] via SUBCUTANEOUS
  Administered 2020-06-05: 3 [IU] via SUBCUTANEOUS
  Administered 2020-06-05: 5 [IU] via SUBCUTANEOUS
  Administered 2020-06-06: 3 [IU] via SUBCUTANEOUS
  Administered 2020-06-06: 8 [IU] via SUBCUTANEOUS
  Administered 2020-06-06 – 2020-06-07 (×2): 5 [IU] via SUBCUTANEOUS
  Administered 2020-06-07: 8 [IU] via SUBCUTANEOUS
  Administered 2020-06-07: 3 [IU] via SUBCUTANEOUS
  Administered 2020-06-08: 5 [IU] via SUBCUTANEOUS
  Administered 2020-06-08: 15 [IU] via SUBCUTANEOUS

## 2020-05-29 MED ORDER — LEVOTHYROXINE SODIUM 100 MCG PO TABS
100.0000 ug | ORAL_TABLET | Freq: Every day | ORAL | Status: DC
  Administered 2020-05-30 – 2020-06-08 (×10): 100 ug via ORAL
  Filled 2020-05-29 (×10): qty 1

## 2020-05-29 MED ORDER — ZOLPIDEM TARTRATE 5 MG PO TABS: 5.0000 mg | ORAL_TABLET | Freq: Every evening | ORAL | Status: DC | PRN

## 2020-05-29 MED ORDER — HYPROMELLOSE (GONIOSCOPIC) 2.5 % OP SOLN
1.0000 [drp] | Freq: Three times a day (TID) | OPHTHALMIC | Status: DC | PRN
  Filled 2020-05-29: qty 15

## 2020-05-29 MED ORDER — HALOPERIDOL LACTATE 5 MG/ML IJ SOLN
2.0000 mg | Freq: Once | INTRAMUSCULAR | Status: AC
  Administered 2020-05-29: 2 mg via INTRAVENOUS
  Filled 2020-05-29: qty 1

## 2020-05-29 MED ORDER — GABAPENTIN 100 MG PO CAPS
100.0000 mg | ORAL_CAPSULE | Freq: Every day | ORAL | Status: DC
  Administered 2020-05-29: 100 mg via ORAL
  Filled 2020-05-29: qty 1

## 2020-05-29 MED ORDER — DOCUSATE SODIUM 100 MG PO CAPS
100.0000 mg | ORAL_CAPSULE | Freq: Two times a day (BID) | ORAL | Status: DC
  Administered 2020-05-29 – 2020-06-08 (×16): 100 mg via ORAL
  Filled 2020-05-29 (×17): qty 1

## 2020-05-29 MED ORDER — POLYVINYL ALCOHOL 1.4 % OP SOLN: 1.0000 [drp] | Freq: Three times a day (TID) | OPHTHALMIC | Status: DC | PRN

## 2020-05-29 MED ORDER — ONDANSETRON HCL 4 MG PO TABS: 4.0000 mg | ORAL_TABLET | Freq: Four times a day (QID) | ORAL | Status: DC | PRN

## 2020-05-29 MED ORDER — ONDANSETRON HCL 4 MG/2ML IJ SOLN: 4.0000 mg | Freq: Four times a day (QID) | INTRAMUSCULAR | Status: DC | PRN

## 2020-05-29 MED ORDER — ACEBUTOLOL HCL 200 MG PO CAPS
200.0000 mg | ORAL_CAPSULE | Freq: Every day | ORAL | Status: DC
  Administered 2020-05-30 – 2020-06-07 (×9): 200 mg via ORAL
  Filled 2020-05-29 (×12): qty 1

## 2020-05-29 MED ORDER — AMLODIPINE BESYLATE 5 MG PO TABS
5.0000 mg | ORAL_TABLET | ORAL | Status: DC
  Administered 2020-05-30 – 2020-06-03 (×5): 5 mg via ORAL
  Filled 2020-05-29 (×5): qty 1

## 2020-05-29 MED ORDER — SODIUM CHLORIDE 0.9 % IV BOLUS: 500.0000 mL | Freq: Once | INTRAVENOUS | Status: DC

## 2020-05-29 MED ORDER — LEVETIRACETAM IN NACL 500 MG/100ML IV SOLN
500.0000 mg | Freq: Two times a day (BID) | INTRAVENOUS | Status: AC
  Administered 2020-05-29 – 2020-06-02 (×9): 500 mg via INTRAVENOUS
  Filled 2020-05-29 (×10): qty 100

## 2020-05-29 MED ORDER — LORAZEPAM 2 MG/ML IJ SOLN: 1.0000 mg | INTRAMUSCULAR | Status: DC | PRN

## 2020-05-29 MED ORDER — BISACODYL 5 MG PO TBEC: 5.0000 mg | DELAYED_RELEASE_TABLET | Freq: Every day | ORAL | Status: DC | PRN

## 2020-05-29 MED ORDER — GABAPENTIN 100 MG PO CAPS: 200.0000 mg | ORAL_CAPSULE | Freq: Every day | ORAL | Status: DC

## 2020-05-29 MED ORDER — LEVETIRACETAM IN NACL 1000 MG/100ML IV SOLN
1000.0000 mg | Freq: Once | INTRAVENOUS | Status: AC
  Administered 2020-05-29: 1000 mg via INTRAVENOUS
  Filled 2020-05-29: qty 100

## 2020-05-29 MED ORDER — HYDRALAZINE HCL 20 MG/ML IJ SOLN: 5.0000 mg | INTRAMUSCULAR | Status: DC | PRN

## 2020-05-29 MED ORDER — ATORVASTATIN CALCIUM 10 MG PO TABS
10.0000 mg | ORAL_TABLET | Freq: Every day | ORAL | Status: DC
  Administered 2020-05-30 – 2020-06-08 (×10): 10 mg via ORAL
  Filled 2020-05-29 (×10): qty 1

## 2020-05-29 MED ORDER — HYDROCODONE-ACETAMINOPHEN 5-325 MG PO TABS: 1.0000 | ORAL_TABLET | ORAL | Status: DC | PRN

## 2020-05-29 NOTE — Progress Notes (Signed)
Established Patient Office Visit  Subjective:  Patient ID: [name], female    DOB: [birth year]  Age: 86 y.o. MRN: [id number]  CC:  Chief Complaint  Patient presents with  . Follow-up    hospital follow up, patient put on neew medication and sleeping more than normal allday and night.     HPI [name] presents for hospital discharge follow-up from the ninth of this month.  Patient had been found unresponsive sitting on the toilet with no evidence of apparent trauma.  Diagnosed with a seizure disorder and started on Keppra.  Has been particularly somnolent after starting this medication.  Follow-up with neurology was recommended.  She ate a bowl of cereal this morning.  Daughter did not give her the Keppra this morning.  Past Medical History:  Diagnosis Date  . Anxiety    loss of child 1 year ago  . Cancer ([place])    right breast  . Diabetes mellitus, type 2 ([place])   . Glaucoma   . Gout   . Hyperlipidemia   . Hypertension    states eccho Dr Mare Ferrari 1 year ago- no record found in [facility] or office records  . Hyperthyroidism     Past Surgical History:  Procedure Laterality Date  . ABDOMINAL HYSTERECTOMY  1995  . BREAST LUMPECTOMY  11/06/2011   Procedure: LUMPECTOMY;  Surgeon: Harl Bowie, MD;  Location: [facility];  Service: General;  Laterality: Right;  . CHOLECYSTECTOMY  1976  . ROTATOR CUFF REPAIR  1999   right    Family History  Problem Relation Age of Onset  . Cancer Mother        bladder  . Kidney disease Mother   . Heart attack Brother   . Stroke Father   . Hypertension Sister   . Hyperlipidemia Daughter   . Diabetes Sister     Social History   Socioeconomic History  . Marital status: Widowed    Spouse name: Not on file  . Number of children: Not on file  . Years of education: Not on file  . Highest education level: Not on file  Occupational History  . Not on file  Tobacco Use  . Smoking status: Never Smoker  . Smokeless tobacco: Never Used    Substance and Sexual Activity  . Alcohol use: No  . Drug use: No  . Sexual activity: Not on file  Other Topics Concern  . Not on file  Social History Narrative   Daughter helps with pt considerably- she lives across the street from her daughter   Homemaker   Completed high school   2 children, both daughters, one died at age 28   Enjoys spending time outside, yard work.   No pets   Social Determinants of Health   Financial Resource Strain:   . Difficulty of Paying Living Expenses:   Food Insecurity:   . Worried About Charity fundraiser in the Last Year:   . Arboriculturist in the Last Year:   Transportation Needs:   . Film/video editor (Medical):   [name] Lack of Transportation (Non-Medical):   Physical Activity:   . Days of Exercise per Week:   . Minutes of Exercise per Session:   Stress:   . Feeling of Stress :   Social Connections:   . Frequency of Communication with Friends and Family:   . Frequency of Social Gatherings with Friends and Family:   . Attends Religious Services:   .  Active Member of Clubs or Organizations:   . Attends Archivist Meetings:   [name] Marital Status:   Intimate Partner Violence:   . Fear of Current or Ex-Partner:   . Emotionally Abused:   [name] Physically Abused:   . Sexually Abused:     Outpatient Medications Prior to Visit  Medication Sig Dispense Refill  . acebutolol (SECTRAL) 200 MG capsule Take 1 capsule (200 mg total) by mouth at bedtime. 90 capsule 2  . acetaminophen (TYLENOL) 500 MG tablet Take 500 mg by mouth in the morning, at noon, and at bedtime.     [name] amLODipine (NORVASC) 5 MG tablet Take 1 tablet (5 mg total) by mouth every morning. 90 tablet 2  . atorvastatin (LIPITOR) 10 MG tablet Take 1 tablet (10 mg total) by mouth daily. 90 tablet 2  . gabapentin (NEURONTIN) 100 MG capsule Take 200 mg by mouth in the morning and 100 mg by mouth at bedtime (Patient taking differently: Take 100-200 mg by mouth 2 (two) times daily.  271m in the morning and 1055mat bedtime.) 90 capsule 2  . glucose blood (TRUETRACK TEST) test strip Use as instructed to check blood sugar once a day.  Dx E11.9 (Patient taking differently: 1 each by Other route See admin instructions. Use as instructed to check blood sugar once a day.  Dx E11.9) 100 each 3  . hydroxypropyl methylcellulose / hypromellose (ISOPTO TEARS / GONIOVISC) 2.5 % ophthalmic solution Place 1 drop into both eyes 3 (three) times daily as needed for dry eyes.    . Marland KitchenevETIRAcetam (KEPPRA) 250 MG tablet Take 1 tablet (250 mg total) by mouth 2 (two) times daily. 60 tablet 0  . levothyroxine (SYNTHROID) 100 MCG tablet Take 1 tablet (100 mcg total) by mouth daily before breakfast. 90 tablet 2  . sitaGLIPtin (JANUVIA) 50 MG tablet Take 1 tablet (50 mg total) by mouth every morning. 90 tablet 2  . telmisartan (MICARDIS) 80 MG tablet Take 1 tablet (80 mg total) by mouth daily. 90 tablet 2  . TRUEPLUS LANCETS 33G MISC Use to check blood sugar once a day. Dx E11.9 (Patient taking differently: 1 each by Other route See admin instructions. Use to check blood sugar once a day. Dx E11.9) 100 each 3  . QUEtiapine (SEROQUEL) 25 MG tablet Take 1 tablet (25 mg total) by mouth at bedtime. (Patient not taking: Reported on 05/29/2020) 30 tablet 0  . risperiDONE (RISPERDAL) 0.5 MG tablet Take 1 tablet (0.5 mg total) by mouth at bedtime. (Patient not taking: Reported on 02/02/2020) 30 tablet 1   No facility-administered medications prior to visit.    Allergies  Allergen Reactions  . Ativan [Lorazepam]     Can not tolerate.     ROS Review of Systems  Constitutional: Positive for activity change. Negative for appetite change, fatigue and unexpected weight change.  HENT: Negative.   Respiratory: Negative.   Cardiovascular: Negative.   Gastrointestinal: Negative.   Neurological: Positive for seizures.  Psychiatric/Behavioral: Positive for decreased concentration. Negative for agitation.        Objective:    Physical Exam Vitals and nursing note reviewed.  Constitutional:      Appearance: She is not diaphoretic.  Cardiovascular:     Rate and Rhythm: Normal rate and regular rhythm.     Pulses:          Carotid pulses are 2+ on the right side and 2+ on the left side. Pulmonary:     Effort: No  respiratory distress.     Breath sounds: Decreased air movement present.  Neurological:     Mental Status: She is lethargic.     BP 102/64   Pulse 60   Temp (!) 97.5 F (36.4 C) (Tympanic)   Ht 5' 7" (1.702 m)   Wt 165 lb (74.8 kg) Comment: with wheelchair  SpO2 95%   BMI 25.84 kg/m  Wt Readings from Last 3 Encounters:  05/29/20 165 lb (74.8 kg)  03/14/20 158 lb 9.6 oz (71.9 kg)  03/01/20 160 lb (72.6 kg)     Health Maintenance Due  Topic Date Due  . COVID-19 Vaccine (1) Never done  . TETANUS/TDAP  Never done  . DEXA SCAN  Never done  . MAMMOGRAM  11/18/2017  . OPHTHALMOLOGY EXAM  05/19/2019    There are no preventive care reminders to display for this patient.  Lab Results  Component Value Date   TSH 1.40 01/31/2020   Lab Results  Component Value Date   WBC 11.8 (H) 05/26/2020   HGB 13.7 05/26/2020   HCT 39.7 05/26/2020   MCV 96.1 05/26/2020   PLT 218 05/26/2020   Lab Results  Component Value Date   NA 138 05/26/2020   K 3.9 05/26/2020   CHLORIDE 102 01/04/2016   CO2 26 05/26/2020   GLUCOSE 239 (H) 05/26/2020   BUN 21 05/26/2020   CREATININE 1.46 (H) 05/26/2020   BILITOT 0.3 02/02/2020   ALKPHOS 49 02/02/2020   AST 20 02/02/2020   ALT 16 02/02/2020   PROT 6.0 (L) 02/02/2020   ALBUMIN 3.2 (L) 02/02/2020   CALCIUM 9.4 05/26/2020   ANIONGAP 9 05/26/2020   EGFR 29 (L) 01/04/2016   GFR 23.80 (L) 03/14/2020   Lab Results  Component Value Date   CHOL 172 06/08/2019   Lab Results  Component Value Date   HDL 42.30 06/08/2019   Lab Results  Component Value Date   LDLCALC 93 03/12/2016   Lab Results  Component Value Date   TRIG 287.0 (H)  06/08/2019   Lab Results  Component Value Date   CHOLHDL 4 06/08/2019   Lab Results  Component Value Date   HGBA1C 6.3 01/31/2020      Assessment & Plan:   Problem List Items Addressed This Visit      Other   Hospital discharge follow-up - Primary   Unresponsive      No orders of the defined types were placed in this encounter.   Follow-up: No follow-ups on file.   Patient lethargic and unresponsive to voice and noxious stimuli (pinching).  EMS summoned.   [name], MD

## 2020-05-29 NOTE — ED Notes (Signed)
Attempted to give report 

## 2020-05-29 NOTE — ED Provider Notes (Signed)
  Provider Note MRN:  [id number]  Arrival date & time: 05/29/20    ED Course and Medical Decision Making  Assumed care from Dr. Sherry Ruffing at shift change.  Patient has had 2 separate episodes of unresponsiveness recently, suspected seizure activity, was recently discharged from the emergency department on Keppra, here for a second episode.  Patient also has diarrhea and AKI, will admit to hospitalist service.  Neurology is following and has provided recommendations including MRI imaging.  Procedures  Final Clinical Impressions(s) / ED Diagnoses     ICD-10-CM   1. Altered awareness, transient  R40.4   2. AKI (acute kidney injury) [facility])  N17.9     ED Discharge Orders    None      Discharge Instructions   None     Barth Kirks. Sedonia Small, [place] [email or web address]    Maudie Flakes, MD 05/29/20 [phone]

## 2020-05-29 NOTE — Consult Note (Addendum)
Neurology Consultation  Reason for Consult: Episode of unresponsiveness Referring Physician: Dr. Gerlene Fee  CC: Episode of unresponsiveness  History is obtained from: Daughter  HPI: [name] is a 86 y.o. female with past medical history of hypothyroidism, hypertension, hyperlipidemia, diabetes, previous episode of unresponsiveness.  Per patient's daughter on 05/26/2020, patient had got up in the morning and had a coffee.  When she went to the bathroom her daughter noted that she was in there for quite a while.  She open the door and found her mother slumped over and at that time was reaching out with her right arm.  The daughter called 911 to which she was brought to the emergency department.  There was question of staring at that time, and involuntary extension of the right arm-concerning for seizure-thus patient was started on 250 mg of Keppra twice daily.  At that time she was asked to  follow-up with PCP and neurology in 1 week.   However prior to following up with neurology today, she was at her primary care doctor waiting area.  While in the waiting area she had a episode to which again she slumped over and was nonresponsive.  The physician did come out and try to arouse her with noxious stimuli and noted she was unarousable.  For that reason 911 was called and patient was brought to the emergency department again.  On this event there was no shaking, reaching out as prior, urinary incontinence or tongue biting.  Currently patient is awake and able to follow commands.  Daughter does note at baseline that she is usually awake and alert however she has noted forgetfulness, patient does not drive, patient does not do her bills and at times has had hallucinations.  On a daily basis she will get up, eat breakfast, walk to her recliner and most of the day sleeps.  ED course  -Patient was loaded with 1 g of Keppra  Past Medical History:  Diagnosis Date  . Anxiety    loss of child 1 year ago   . Cancer ([place])    right breast  . Diabetes mellitus, type 2 ([place])   . Glaucoma   . Gout   . Hyperlipidemia   . Hypertension    states eccho Dr Mare Ferrari 1 year ago- no record found in [facility] or office records  . Hyperthyroidism    Family History  Problem Relation Age of Onset  . Cancer Mother        bladder  . Kidney disease Mother   . Heart attack Brother   . Stroke Father   . Hypertension Sister   . Hyperlipidemia Daughter   . Diabetes Sister    Social History:   reports that she has never smoked. She has never used smokeless tobacco. She reports that she does not drink alcohol and does not use drugs.  Medications  Current Facility-Administered Medications:  .  levETIRAcetam (KEPPRA) IVPB 500 mg/100 mL premix, 500 mg, Intravenous, Q12H, Marliss Coots, PA-C .  sodium chloride 0.9 % bolus 500 mL, 500 mL, Intravenous, Once, Tegeler, Gwenyth Allegra, MD  Current Outpatient Medications:  .  acebutolol (SECTRAL) 200 MG capsule, Take 1 capsule (200 mg total) by mouth at bedtime., Disp: 90 capsule, Rfl: 2 .  acetaminophen (TYLENOL) 500 MG tablet, Take 500 mg by mouth in the morning, at noon, and at bedtime. , Disp: , Rfl:  .  amLODipine (NORVASC) 5 MG tablet, Take 1 tablet (5 mg total) by mouth every  morning., Disp: 90 tablet, Rfl: 2 .  atorvastatin (LIPITOR) 10 MG tablet, Take 1 tablet (10 mg total) by mouth daily., Disp: 90 tablet, Rfl: 2 .  gabapentin (NEURONTIN) 100 MG capsule, Take 200 mg by mouth in the morning and 100 mg by mouth at bedtime (Patient taking differently: Take 100-200 mg by mouth 2 (two) times daily. 200mg  in the morning and 100mg  at bedtime.), Disp: 90 capsule, Rfl: 2 .  hydroxypropyl methylcellulose / hypromellose (ISOPTO TEARS / GONIOVISC) 2.5 % ophthalmic solution, Place 1 drop into both eyes 3 (three) times daily as needed for dry eyes., Disp: , Rfl:  .  levETIRAcetam (KEPPRA) 250 MG tablet, Take 1 tablet (250 mg total) by mouth 2 (two) times daily., Disp: 60  tablet, Rfl: 0 .  levothyroxine (SYNTHROID) 100 MCG tablet, Take 1 tablet (100 mcg total) by mouth daily before breakfast., Disp: 90 tablet, Rfl: 2 .  Multiple Vitamins-Minerals (PRESERVISION AREDS 2+MULTI VIT PO), Take 1 capsule by mouth in the morning and at bedtime., Disp: , Rfl:  .  sitaGLIPtin (JANUVIA) 50 MG tablet, Take 1 tablet (50 mg total) by mouth every morning., Disp: 90 tablet, Rfl: 2 .  telmisartan (MICARDIS) 80 MG tablet, Take 1 tablet (80 mg total) by mouth daily., Disp: 90 tablet, Rfl: 2 .  glucose blood (TRUETRACK TEST) test strip, Use as instructed to check blood sugar once a day.  Dx E11.9 (Patient taking differently: 1 each by Other route See admin instructions. Use as instructed to check blood sugar once a day.  Dx E11.9), Disp: 100 each, Rfl: 3 .  TRUEPLUS LANCETS 33G MISC, Use to check blood sugar once a day. Dx E11.9 (Patient taking differently: 1 each by Other route See admin instructions. Use to check blood sugar once a day. Dx E11.9), Disp: 100 each, Rfl: 3  ROS:    General ROS: negative for - chills, fatigue, fever, night sweats, weight gain or weight loss Psychological ROS: Positive for -  hallucinations, memory difficulties, Ophthalmic ROS: negative for - blurry vision, double vision, eye pain or loss of vision ENT ROS: negative for - epistaxis, nasal discharge, oral lesions, sore throat, tinnitus or vertigo Respiratory ROS: negative for - cough, hemoptysis, shortness of breath or wheezing Cardiovascular ROS: negative for - chest pain, dyspnea on exertion, edema or irregular heartbeat Gastrointestinal ROS: negative for - abdominal pain, diarrhea, hematemesis, nausea/vomiting or stool incontinence Genito-Urinary ROS: negative for - dysuria, hematuria, incontinence or urinary frequency/urgency Musculoskeletal ROS: negative for - joint swelling or muscular weakness Neurological ROS: as noted in HPI Dermatological ROS: negative for rash and skin lesion  changes  Exam: Current vital signs: BP (!) 149/88   Pulse 64   Resp 16   Wt 74.8 kg   SpO2 93%   BMI 25.83 kg/m  Vital signs in last 24 hours: Temp:  [97.5 F (36.4 C)] 97.5 F (36.4 C) (07/12 1138) Pulse Rate:  [60-65] 64 (07/12 1456) Resp:  [16-21] 16 (07/12 1456) BP: (102-150)/(53-88) 149/88 (07/12 1445) SpO2:  [90 %-95 %] 93 % (07/12 1456) Weight:  [74.8 kg] 74.8 kg (07/12 1415) Constitutional: Appears well-developed and well-nourished.  Eyes: No scleral injection HENT: No OP obstrucion Head: Normocephalic.  Cardiovascular: Normal rate and regular rhythm.  Respiratory: Effort normal, non-labored breathing GI: Soft.  No distension. There is no tenderness.  Skin: WDI Neuro: Mental Status: Patient is awake, alert, oriented to name, age, hospital and the date is Monday however she did miss the month initially but when  told it was July, she was able to say was July.  Patient was able to follow commands that were simple, patient showed no dysarthria, aphasia. Cranial Nerves: II: Visual Fields are full.  III,IV, VI: EOMI without ptosis or diploplia. Pupils equal, round and reactive to light V: Facial sensation is symmetric to temperature VII: Facial movement is symmetric.  VIII: hearing is intact to voice XII: tongue is midline without atrophy or fasciculations.  Motor: Moving all extremities antigravity with no drift and/or asterixis. Sensory: Sensation is symmetric to light touch and temperature in the arms and legs.  Labs I have reviewed labs in epic and the results pertinent to this consultation are:   CBC    Component Value Date/Time   WBC 9.5 05/29/2020 1400   RBC 3.54 (L) 05/29/2020 1400   HGB 11.6 (L) 05/29/2020 1400   HGB 13.3 01/04/2016 1218   HCT 34.2 (L) 05/29/2020 1400   HCT 39.5 01/04/2016 1218   PLT 196 05/29/2020 1400   PLT 238 01/04/2016 1218   MCV 96.6 05/29/2020 1400   MCV 95.3 01/04/2016 1218   MCH 32.8 05/29/2020 1400   MCHC 33.9  05/29/2020 1400   RDW 11.9 05/29/2020 1400   RDW 12.2 01/04/2016 1218   LYMPHSABS 1.5 05/29/2020 1400   LYMPHSABS 2.0 01/04/2016 1218   MONOABS 0.8 05/29/2020 1400   MONOABS 0.7 01/04/2016 1218   EOSABS 0.1 05/29/2020 1400   EOSABS 0.2 01/04/2016 1218   BASOSABS 0.0 05/29/2020 1400   BASOSABS 0.1 01/04/2016 1218    CMP     Component Value Date/Time   NA 135 05/29/2020 1400   NA 139 08/13/2019 1444   NA 139 01/04/2016 1218   K 3.5 05/29/2020 1400   K 4.0 01/04/2016 1218   CL 100 05/29/2020 1400   CL 104 12/10/2012 1145   CO2 22 05/29/2020 1400   CO2 28 01/04/2016 1218   GLUCOSE 194 (H) 05/29/2020 1400   GLUCOSE 148 (H) 01/04/2016 1218   GLUCOSE 149 (H) 12/10/2012 1145   BUN 34 (H) 05/29/2020 1400   BUN 18 08/13/2019 1444   BUN 30.5 (H) 01/04/2016 1218   CREATININE 2.26 (H) 05/29/2020 1400   CREATININE 1.5 (H) 01/04/2016 1218   CALCIUM 8.6 (L) 05/29/2020 1400   CALCIUM 9.8 01/04/2016 1218   PROT 5.9 (L) 05/29/2020 1400   PROT 7.4 01/04/2016 1218   ALBUMIN 2.9 (L) 05/29/2020 1400   ALBUMIN 3.8 01/04/2016 1218   AST 13 (L) 05/29/2020 1400   AST 17 01/04/2016 1218   ALT 11 05/29/2020 1400   ALT 16 01/04/2016 1218   ALKPHOS 56 05/29/2020 1400   ALKPHOS 67 01/04/2016 1218   BILITOT 0.6 05/29/2020 1400   BILITOT 0.69 01/04/2016 1218   GFRNONAA 17 (L) 05/29/2020 1400   GFRAA 20 (L) 05/29/2020 1400    Lipid Panel     Component Value Date/Time   CHOL 172 06/08/2019 1030   TRIG 287.0 (H) 06/08/2019 1030   HDL 42.30 06/08/2019 1030   CHOLHDL 4 06/08/2019 1030   VLDL 57.4 (H) 06/08/2019 1030   LDLCALC 93 03/12/2016 0935   LDLDIRECT 114.0 01/31/2020 1627     Imaging I have reviewed the images obtained:   Etta Quill PA-C [facility] [phone]  M-F  (9:00 am- 5:00 PM)  05/29/2020, 3:24 PM   Assessment:  This is a 86 year old female presenting to the hospital with her second episode of unresponsiveness.  Both episodes were very similar however  the episode today did  not have any actions such as reaching out to touch something and were movements of limbs.  As noted patient was started on Keppra 250 mg twice daily on last visit.  Given the fact that the patient is 86 years old and most likely does have a neurocognitive decline it does raise the question of possible seizures however the other etiology very well could be syncopal episodes.  Impression: -Neurocognitive decline -Periods of unresponsiveness-seizure versus syncope  Recommendations: -Seizure precautions -Orthostatic blood pressures -EEG -MRA head without contrast -Carotid Dopplers -We will increase Keppra to 500 mg twice daily -Cardiac work-up for syncope -We will still need outpatient follow-up with neurology.  Attending Neurohospitalist Addendum Patient seen and examined with APP/Resident. Agree with the history and physical as documented above. Agree with the plan as documented, which I helped formulate. I have independently reviewed the chart, obtained history, review of systems and examined the patient.I have personally reviewed pertinent head/neck/spine imaging (CT/MRI). Please feel free to call with any questions. --- Amie Portland, MD [facility] Pager: [phone]  If 7pm to 7am, please call on call as listed on AMION.  Addendum: MRI was finished but MRA was unable to be finished despite use of Haldo due to inability to stay still.

## 2020-05-29 NOTE — Progress Notes (Signed)
Got Patient to MRI. Started Brain scan. Patient started beating on inside of scanner and pulled off head coil. Patient moving around and trying to get off table. Called RN. Got some medicine for patient to help relax. Was able to get brain scan complete, but was unable to get MRA Neck complete as patient started in with the same behavior as before. Sent back to ED. RN Notified.

## 2020-05-29 NOTE — ED Notes (Signed)
Report given [facility] RN

## 2020-05-29 NOTE — Procedures (Signed)
Patient Name: [name]  MRN: [id number]  Epilepsy Attending: Lora Havens  Referring Physician/Provider: Dr Karmen Bongo Date: 07/30/2020 Duration: 23.25 mins  Patient history:  86 year old female presenting to the hospital with her second episode of unresponsiveness. EEG to evaluate for seizure.  Level of alertness: Awake  AEDs during EEG study: gabapentin, LEV  Technical aspects: This EEG study was done with scalp electrodes positioned according to the 10-20 International system of electrode placement. Electrical activity was acquired at a sampling rate of 500Hz  and reviewed with a high frequency filter of 70Hz  and a low frequency filter of 1Hz . EEG data were recorded continuously and digitally stored.   Description: No clear posterior dominant rhythm consists of 9-10 Hz activity of moderate voltage (25-35 uV) seen predominantly in posterior head regions, symmetric and reactive to eye opening and eye closing. EEG showed continuous generalized 5 to 8 Hz theta-alpha activity. Hyperventilation and photic stimulation were not performed.     ABNORMALITY -Continuous slow, generalized  IMPRESSION: This study is suggestive of mild to moderate diffuse encephalopathy, nonspecific etiology. No seizures or epileptiform discharges were seen throughout the recording.  Leman Martinek Barbra Sarks

## 2020-05-29 NOTE — ED Notes (Signed)
Help get patient undress on the monitor did ekg shown to Dr Sherry Ruffing

## 2020-05-29 NOTE — H&P (Signed)
History and Physical    [name] BDZ:[id number] DOB: 12-21-1918 DOA: 05/29/2020  PCP: [name], MD Consultants:  Prudence Davidson - podiatry Patient coming from: Home - lives alone with family very close by; NOK: Daughter, [name], [phone]    Chief Complaint: AMS  HPI: [name] is a 86 y.o. female with medical history significant of HTN; HLD; DM; glaucoma; anxiety; hypothyroidism; and R breast cancer presenting with AMS.  She was previously seen on 7/9 for syncope after falling off the commode.  There had been several episodes of falls and abnormal behavior including fecal/urinary incontinence after being found down; unresponsiveness with repetitive blinking movements; and repetitive grasping movements with her right hand suggestive of seizures.  Neurology recommended loading with Keppra and outpatient neuro f/u.    This AM, she was at the PCP for f/u.  She was snoozing (has been more somnolent since starting Keppra) and became unresponsive.  911 was called for transport.  In further discussion, the patient is having memory issues as well as apparent AV hallucinations.  She is becoming increasingly confused and has had multiple falls with inability to get herself up.  There was urinary and fecal incontinence but it is hard to know if this was related to the event itself or whether it was due to inability to get back up after the fall.  Her daughter hired nighttime caregivers 4 nights a week and stays with her the other 3 nights and also stays with her during the day most of the daytime hours.        ED Course: Syncope vs. Seizure on the commode 3 days ago - given Keppra.  Sleepy since, unresponsive at doctor office today.  Here, awake and joking.  Covered in stool and urine.  Concern for strokes.  EEG, MRI/MRA recommended by neuro - seizure vs. CVA.  Has diarrhea and AKI, as well.    Review of Systems: Unable to perform due to cognitive impairment   Past Medical History:    Diagnosis Date  . Anxiety    loss of child 1 year ago  . Cancer ([place])    right breast  . Diabetes mellitus, type 2 ([place])   . DNR (do not resuscitate) discussion 05/29/2020  . Glaucoma   . Gout   . Hyperlipidemia   . Hypertension    states eccho Dr Mare Ferrari 1 year ago- no record found in [facility] or office records  . Hyperthyroidism     Past Surgical History:  Procedure Laterality Date  . ABDOMINAL HYSTERECTOMY  1995  . BREAST LUMPECTOMY  11/06/2011   Procedure: LUMPECTOMY;  Surgeon: Harl Bowie, MD;  Location: [facility];  Service: General;  Laterality: Right;  . CHOLECYSTECTOMY  1976  . ROTATOR CUFF REPAIR  1999   right    Social History   Socioeconomic History  . Marital status: Widowed    Spouse name: Not on file  . Number of children: Not on file  . Years of education: Not on file  . Highest education level: Not on file  Occupational History  . Not on file  Tobacco Use  . Smoking status: Never Smoker  . Smokeless tobacco: Never Used  Substance and Sexual Activity  . Alcohol use: No  . Drug use: No  . Sexual activity: Not on file  Other Topics Concern  . Not on file  Social History Narrative   Daughter helps with pt considerably- she lives across the street from her daughter  Homemaker   Completed high school   2 children, both daughters, one died at age 36   Enjoys spending time outside, yard work.   No pets   Social Determinants of Health   Financial Resource Strain:   . Difficulty of Paying Living Expenses:   Food Insecurity:   . Worried About Charity fundraiser in the Last Year:   . Arboriculturist in the Last Year:   Transportation Needs:   . Film/video editor (Medical):   [name] Lack of Transportation (Non-Medical):   Physical Activity:   . Days of Exercise per Week:   . Minutes of Exercise per Session:   Stress:   . Feeling of Stress :   Social Connections:   . Frequency of Communication with Friends and Family:   . Frequency of Social  Gatherings with Friends and Family:   . Attends Religious Services:   . Active Member of Clubs or Organizations:   . Attends Archivist Meetings:   [name] Marital Status:   Intimate Partner Violence:   . Fear of Current or Ex-Partner:   . Emotionally Abused:   [name] Physically Abused:   . Sexually Abused:     Allergies  Allergen Reactions  . Ativan [Lorazepam] Other (See Comments)    Aggressive behavior    Family History  Problem Relation Age of Onset  . Cancer Mother        bladder  . Kidney disease Mother   . Heart attack Brother   . Stroke Father   . Hypertension Sister   . Hyperlipidemia Daughter   . Diabetes Sister     Prior to Admission medications   Medication Sig Start Date End Date Taking? Authorizing Provider  acebutolol (SECTRAL) 200 MG capsule Take 1 capsule (200 mg total) by mouth at bedtime. 10/20/19  Yes [name], MD  acetaminophen (TYLENOL) 500 MG tablet Take 500 mg by mouth in the morning, at noon, and at bedtime.    Yes [provider]  amLODipine (NORVASC) 5 MG tablet Take 1 tablet (5 mg total) by mouth every morning. 10/20/19  Yes [name], MD  atorvastatin (LIPITOR) 10 MG tablet Take 1 tablet (10 mg total) by mouth daily. 10/20/19  Yes [name], MD  gabapentin (NEURONTIN) 100 MG capsule Take 200 mg by mouth in the morning and 100 mg by mouth at bedtime Patient taking differently: Take 100-200 mg by mouth 2 (two) times daily. 200mg  in the morning and 100mg  at bedtime. 10/20/19  Yes [name], MD  hydroxypropyl methylcellulose / hypromellose (ISOPTO TEARS / GONIOVISC) 2.5 % ophthalmic solution Place 1 drop into both eyes 3 (three) times daily as needed for dry eyes.   Yes [provider]  levETIRAcetam (KEPPRA) 250 MG tablet Take 1 tablet (250 mg total) by mouth 2 (two) times daily. 05/26/20 06/25/20 Yes Noemi Chapel, MD  levothyroxine (SYNTHROID) 100 MCG tablet Take 1 tablet (100 mcg total)  by mouth daily before breakfast. 10/20/19  Yes [name], MD  Multiple Vitamins-Minerals (PRESERVISION AREDS 2+MULTI VIT PO) Take 1 capsule by mouth in the morning and at bedtime.   Yes [provider]  sitaGLIPtin (JANUVIA) 50 MG tablet Take 1 tablet (50 mg total) by mouth every morning. 10/20/19  Yes [name], MD  telmisartan (MICARDIS) 80 MG tablet Take 1 tablet (80 mg total) by mouth daily. 10/20/19  Yes [name], MD  glucose blood (TRUETRACK TEST) test  strip Use as instructed to check blood sugar once a day.  Dx E11.9 Patient taking differently: 1 each by Other route See admin instructions. Use as instructed to check blood sugar once a day.  Dx E11.9 12/29/18   [name], MD  TRUEPLUS LANCETS 33G MISC Use to check blood sugar once a day. Dx E11.9 Patient taking differently: 1 each by Other route See admin instructions. Use to check blood sugar once a day. Dx E11.9 12/29/18   [name], MD  glimepiride (AMARYL) 1 MG tablet Take 1 tablet (1 mg total) by mouth every morning. Patient not taking: Reported on 02/02/2020 10/20/19 02/03/20  [name], MD    Physical Exam: Vitals:   05/29/20 1415 05/29/20 1430 05/29/20 1445 05/29/20 1456  BP: (!) 138/53 (!) 150/63 (!) 149/88   Pulse: 61 64 65 64  Resp: 17 19 16 16   SpO2: 91% 95% 91% 93%  Weight: 74.8 kg        . General:  Appears calm and comfortable and is NAD; somewhat somnolent; oriented to person only . Eyes:  EOMI, normal lids, iris . ENT:  grossly normal hearing, lips & tongue, mmm . Neck:  no LAD, masses or thyromegaly . Cardiovascular:  RRR, no m/r/g. No LE edema.  [name] Respiratory:   CTA bilaterally with no wheezes/rales/rhonchi.  Normal respiratory effort. . Abdomen:  soft, NT, ND, NABS . Skin:  no rash or induration seen on limited exam . Musculoskeletal:  grossly normal tone BUE/BLE, good ROM, no bony abnormality . Lower extremity:  No LE edema.   Limited foot exam with no ulcerations.  2+ distal pulses. [name] Psychiatric:  grossly normal mood and affect, speech fluent and appropriate, AOx1 . Neurologic:  CN 2-12 grossly intact, moves all extremities in coordinated fashion    Radiological Exams on Admission: MR BRAIN WO CONTRAST  Result Date: 05/29/2020 CLINICAL DATA:  Altered mental status of unknown etiology. EXAM: MRI HEAD WITHOUT CONTRAST TECHNIQUE: Multiplanar, multiecho pulse sequences of the brain and surrounding structures were obtained without intravenous contrast. COMPARISON:  Head CT 05/26/2020 FINDINGS: Brain: Diffusion imaging does not show any acute or subacute infarction. Mild chronic small-vessel change affects the pons. No focal cerebellar finding. Cerebral hemispheres show age related atrophy with moderate chronic small-vessel ischemic changes of the white matter. No large vessel territory infarction. No intra-axial mass lesion, acute hemorrhage, hydrocephalus or extra-axial collection. There are scattered punctate foci of hemosiderin deposition associated with some of the old small vessel insults. There is a meningioma along the left side of the falx with a diameter of 15 mm and a thickness of 8 mm, incidental, not having any mass-effect upon the brain. Vascular: Major vessels at the base of the brain show flow. Skull and upper cervical spine: Negative Sinuses/Orbits: Clear/normal Other: None IMPRESSION: No acute or reversible finding. Age related atrophy. Chronic small-vessel ischemic changes throughout the brain, many associated with punctate hemosiderin deposition. No evidence of recent hemorrhage. Incidental meningioma along the left side of the falx with a diameter of 15 mm in thickness of 8 mm, not having any significant mass-effect upon the brain. Electronically Signed   By: Nelson Chimes M.D.   On: 05/29/2020 16:34   DG Chest Portable 1 View  Result Date: 05/29/2020 CLINICAL DATA:  Loss of consciousness EXAM: PORTABLE CHEST  1 VIEW COMPARISON:  None. FINDINGS: Multiple leads overlie the patient. The patient's hands overlie the lung bases. Low lung volumes. Lungs grossly clear. No significant pleural  effusion. No pneumothorax. Cardiomediastinal contours are likely within normal limits for portable technique. IMPRESSION: No acute process the chest. Electronically Signed   By: Macy Mis M.D.   On: 05/29/2020 14:04    EKG: Independently reviewed.  NSR with rate 65; nonspecific ST changes with no evidence of acute ischemia   Labs on Admission: I have personally reviewed the available labs and imaging studies at the time of the admission.  Pertinent labs:   Glucose 194 BUN 34/Creatinine 2.26/GFR 17; 21/1.46/30 on 7/9 Albumin 2.9 WBC 9.5 Hgb 11.6 TSH 2.886   Assessment/Plan Principal Problem:   Altered mental status Active Problems:   Hypothyroidism   Diabetes mellitus type 2, noninsulin dependent (HCC)   Essential hypertension   Hyperlipidemia   CKD (chronic kidney disease) stage 3, GFR 30-59 ml/min   Dementia with behavioral disturbance (HCC)   DNR (do not resuscitate) discussion   AMS with underlying dementia -Patient presenting with encephalopathy as evidenced by her unresponsiveness at PCP office -While the patient does have underlying dementia, this may be a change compared to her usual baseline mental status -Her daughter reports some issues suggestive of seizure - although it would be somewhat unusual to develop epilepsy at age 41 -There is also some ? Of CVA - neuro has recommended MRI/MRA -At this time, based on my discussion with the patient's daughter, I am more suspicious that this is related to advancing dementia -Evaluation thus far unremarkable other than mild AKI -Based on unremarkable evaluation with current ability to protect her airway, will observe for now with IVF hydration and telemetry monitoring -50% of patients with delirium while hospitalized will be institutionalized at  6 months, and these patients have a 25% mortality at 6 months -The family would benefit from being referred to the [facility] and also provided with the [employer] website -Neurology has seen the patient -Increase Keppra to 500 mg PO BID -Will order EEG  -Seizure precautions -Ativan prn -If imaging/EEG is negative, consider palliative care consultation  AKI on stage 3b CKD -Baseline advanced CKD nearing stage 4, currently with apparent mild AKI -Possibly related to increased losses associated with diarrhea, although the ER RN notes that stool is formed and not overly foul-smelling so fairly low suspicion for C diff (only one stool so far in the ER) -C diff testing is pending -Will provide gentle IVF rehydration in case mild AKI is contributing to AMS -Hold Micardis  DM -Will check A1c -hold Januvia -Cover with moderate-scale SSI  HTN -Continue Acebutolol, Norvasc -Hold Micardis given AKI  HLD -Continue Lipitor - although it may be reasonable to consider discontinuation of this medication at some point in the near future  Hypothyroidism -Check TSH -Continue Synthroid at current dose for now  DNR -I have discussed code status with the patient's daughter; the patient would not desire resuscitation and would prefer to die a natural death should that situation arise.    Note: This patient has been tested and is negative for the novel coronavirus COVID-19.  DVT prophylaxis:  Lovenox  Code Status:  DNR - confirmed with patient Family Communication: Daughter was present throughout evaluation Disposition Plan:  The patient is from: home  Anticipated d/c is to: home with [facility] services vs. home with hospice  Anticipated d/c date will depend on clinical response to treatment, but possibly as early as tomorrow if she has excellent response to treatment  Patient is currently: acutely ill Consults called: Neurology; [facility] team Admission status:  It  is my clinical opinion  that referral for OBSERVATION is reasonable and necessary in this patient based on the above information provided. The aforementioned taken together are felt to place the patient at high risk for further clinical deterioration. However it is anticipated that the patient may be medically stable for discharge from the hospital within 24 to 48 hours.    Karmen Bongo MD [facility]   How to contact the [facility] Attending or Consulting provider [place] or covering provider during after hours [place], for this patient?  1. Check the care team in [facility] and look for a) attending/consulting TR[facility] provider listed and b) the [facility] team listed 2. Log into [email or web address] and use 's universal password to access. If you do not have the password, please contact the hospital operator. 3. Locate the [facility] provider you are looking for under [facility] and page to a number that you can be directly reached. 4. If you still have difficulty reaching the provider, please page the [facility] (Director on Call) for the Hospitalists listed on amion for assistance.   05/29/2020, 5:12 PM

## 2020-05-29 NOTE — ED Provider Notes (Signed)
[facility]ERGENCY DEPARTMENT Provider Note   CSN: [id number] Arrival date & time:        History Chief Complaint  Patient presents with  . Loss of Consciousness    [name] is a 86 y.o. female.  The history is provided by the patient, medical records and a relative (daughter). No language interpreter was used.  Altered Mental Status Presenting symptoms: unresponsiveness   Presenting symptoms: no confusion   Presenting symptoms comment:  Seizure vs syncope Severity:  Severe Most recent episode:  2 days ago Episode history:  Multiple Duration:  3 days Timing:  Intermittent Progression:  Waxing and waning Chronicity:  New Context: not nursing home resident   Associated symptoms: seizures (as of 3 days ago)   Associated symptoms: no abdominal pain, no agitation, no fever, no hallucinations, no headaches, no light-headedness, no nausea, no palpitations, no rash, no vomiting and no weakness   Associated symptoms comment:  Diarrrhea       Past Medical History:  Diagnosis Date  . Anxiety    loss of child 1 year ago  . Cancer ([place])    right breast  . Diabetes mellitus, type 2 ([place])   . Glaucoma   . Gout   . Hyperlipidemia   . Hypertension    states eccho Dr Mare Ferrari 1 year ago- no record found in [facility] or office records  . Hyperthyroidism     Patient Active Problem List   Diagnosis Date Noted  . Unresponsive 05/29/2020  . Dementia with behavioral disturbance ([place]) 12/01/2019  . 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 Open wound of arm with complication, right, initial encounter 08/13/2019  . Need for influenza vaccination 08/13/2019  . Hypochloremia 07/13/2019  . Syncope 06/24/2019  . Hyponatremia   . Pain due to onychomycosis of toenails of both feet 05/18/2019  . Diabetic neuropathy ([place]) 05/18/2019  . Macrocytic anemia 12/08/2018  . Dehydration 08/06/2018  . Hospital discharge follow-up 08/06/2018  . Abnormal liver function   . Acute  lower UTI 07/31/2018  . Sepsis ([place]) 07/31/2018  . Hypokalemia 07/31/2018  . AKI (acute kidney injury) ([place]) 07/31/2018  . CKD (chronic kidney disease) stage 3, GFR 30-59 ml/min 07/09/2018  . Urinary frequency 04/14/2018  . Hyperlipidemia 04/08/2017  . Right knee pain 12/10/2016  . Osteoarthritis 04/23/2016  . Peripheral neuropathy 04/23/2016  . Joint pain, hip 06/22/2015  . Skin lesion of face 06/10/2013  . General medical examination 10/30/2011  . Cancer of overlapping sites of right female breast ([place]) 10/09/2011  . Hypothyroidism 12/13/2010  . Diabetes mellitus type 2, noninsulin dependent ([place]) 12/13/2010  . Gout 12/13/2010  . ANXIETY 12/13/2010  . Essential hypertension 12/13/2010    Past Surgical History:  Procedure Laterality Date  . ABDOMINAL HYSTERECTOMY  1995  . BREAST LUMPECTOMY  11/06/2011   Procedure: LUMPECTOMY;  Surgeon: Harl Bowie, MD;  Location: [facility];  Service: General;  Laterality: Right;  . CHOLECYSTECTOMY  1976  . [place]   right     OB History   No obstetric history on file.     Family History  Problem Relation Age of Onset  . Cancer Mother        bladder  . Kidney disease Mother   . Heart attack Brother   . Stroke Father   . Hypertension Sister   . Hyperlipidemia Daughter   . Diabetes Sister     Social History   Tobacco Use  . Smoking status:  Never Smoker  . Smokeless tobacco: Never Used  Substance Use Topics  . Alcohol use: No  . Drug use: No    Home Medications Prior to Admission medications   Medication Sig Start Date End Date Taking? Authorizing Provider  acebutolol (SECTRAL) 200 MG capsule Take 1 capsule (200 mg total) by mouth at bedtime. 10/20/19   [name], MD  acetaminophen (TYLENOL) 500 MG tablet Take 500 mg by mouth in the morning, at noon, and at bedtime.     [provider]  amLODipine (NORVASC) 5 MG tablet Take 1 tablet (5 mg total) by mouth every morning. 10/20/19    [name], MD  atorvastatin (LIPITOR) 10 MG tablet Take 1 tablet (10 mg total) by mouth daily. 10/20/19   [name], MD  gabapentin (NEURONTIN) 100 MG capsule Take 200 mg by mouth in the morning and 100 mg by mouth at bedtime Patient taking differently: Take 100-200 mg by mouth 2 (two) times daily. 200mg  in the morning and 100mg  at bedtime. 10/20/19   [name], MD  glucose blood (TRUETRACK TEST) test strip Use as instructed to check blood sugar once a day.  Dx E11.9 Patient taking differently: 1 each by Other route See admin instructions. Use as instructed to check blood sugar once a day.  Dx E11.9 12/29/18   [name], MD  hydroxypropyl methylcellulose / hypromellose (ISOPTO TEARS / GONIOVISC) 2.5 % ophthalmic solution Place 1 drop into both eyes 3 (three) times daily as needed for dry eyes.    [provider]  levETIRAcetam (KEPPRA) 250 MG tablet Take 1 tablet (250 mg total) by mouth 2 (two) times daily. 05/26/20 06/25/20  Noemi Chapel, MD  levothyroxine (SYNTHROID) 100 MCG tablet Take 1 tablet (100 mcg total) by mouth daily before breakfast. 10/20/19   [name], MD  sitaGLIPtin (JANUVIA) 50 MG tablet Take 1 tablet (50 mg total) by mouth every morning. 10/20/19   [name], MD  telmisartan (MICARDIS) 80 MG tablet Take 1 tablet (80 mg total) by mouth daily. 10/20/19   [name], MD  TRUEPLUS LANCETS 33G MISC Use to check blood sugar once a day. Dx E11.9 Patient taking differently: 1 each by Other route See admin instructions. Use to check blood sugar once a day. Dx E11.9 12/29/18   [name], MD  glimepiride (AMARYL) 1 MG tablet Take 1 tablet (1 mg total) by mouth every morning. Patient not taking: Reported on 02/02/2020 10/20/19 02/03/20  [name], MD    Allergies    Ativan [lorazepam]  Review of Systems   Review of Systems  Constitutional: Positive for fatigue. Negative for  chills, diaphoresis and fever.  HENT: Negative for congestion.   Eyes: Negative for visual disturbance.  Respiratory: Negative for cough, chest tightness, shortness of breath and wheezing.   Cardiovascular: Negative for chest pain, palpitations and leg swelling.  Gastrointestinal: Positive for diarrhea. Negative for abdominal pain, constipation, nausea and vomiting.  Genitourinary: Negative for dysuria, flank pain and frequency.  Musculoskeletal: Negative for back pain, neck pain and neck stiffness.  Skin: Negative for rash and wound.  Neurological: Positive for seizures (as of 3 days ago) and syncope (vs seizure). Negative for weakness, light-headedness, numbness and headaches.  Psychiatric/Behavioral: Negative for agitation, confusion and hallucinations.  All other systems reviewed and are negative.   Physical Exam Updated Vital Signs BP (!) 149/88   Pulse 64   Resp 16   Wt 74.8 kg  SpO2 93%   BMI 25.83 kg/m   Physical Exam Vitals and nursing note reviewed.  Constitutional:      General: She is not in acute distress.    Appearance: She is well-developed. She is not ill-appearing, toxic-appearing or diaphoretic.  HENT:     Head: Normocephalic and atraumatic.     Nose: Nose normal. No congestion or rhinorrhea.     Mouth/Throat:     Mouth: Mucous membranes are dry.     Pharynx: No oropharyngeal exudate or posterior oropharyngeal erythema.  Eyes:     Extraocular Movements: Extraocular movements intact.     Conjunctiva/sclera: Conjunctivae normal.     Pupils: Pupils are equal, round, and reactive to light.  Cardiovascular:     Rate and Rhythm: Normal rate and regular rhythm.     Pulses: Normal pulses.     Heart sounds: No murmur heard.   Pulmonary:     Effort: Pulmonary effort is normal. No respiratory distress.     Breath sounds: Normal breath sounds. No wheezing, rhonchi or rales.  Chest:     Chest wall: No tenderness.  Abdominal:     General: Abdomen is flat.      Palpations: Abdomen is soft.     Tenderness: There is no abdominal tenderness. There is no right CVA tenderness, left CVA tenderness, guarding or rebound.  Musculoskeletal:        General: No tenderness or deformity.     Cervical back: Neck supple. No tenderness.     Left lower leg: No edema.  Skin:    General: Skin is warm and dry.     Capillary Refill: Capillary refill takes less than 2 seconds.     Findings: No erythema.  Neurological:     General: No focal deficit present.     Mental Status: She is alert.     Sensory: No sensory deficit.     Motor: No weakness.  Psychiatric:        Mood and Affect: Mood normal.     ED Results / Procedures / Treatments   Labs (all labs ordered are listed, but only abnormal results are displayed) Labs Reviewed  CBC WITH DIFFERENTIAL/PLATELET - Abnormal; Notable for the following components:      Result Value   RBC 3.54 (*)    Hemoglobin 11.6 (*)    HCT 34.2 (*)    All other components within normal limits  COMPREHENSIVE METABOLIC PANEL - Abnormal; Notable for the following components:   Glucose, Bld 194 (*)    BUN 34 (*)    Creatinine, Ser 2.26 (*)    Calcium 8.6 (*)    Total Protein 5.9 (*)    Albumin 2.9 (*)    AST 13 (*)    GFR calc non Af Amer 17 (*)    GFR calc Af Amer 20 (*)    All other components within normal limits  CBG MONITORING, ED - Abnormal; Notable for the following components:   Glucose-Capillary 169 (*)    All other components within normal limits  URINE CULTURE  SARS CORONAVIRUS 2 BY RT PCR (HOSPITAL ORDER, [facility])  AMMONIA  TSH  URINALYSIS, ROUTINE W REFLEX MICROSCOPIC    EKG EKG Interpretation  Date/Time:  Monday May 29 2020 13:33:25 EDT Ventricular Rate:  65 PR Interval:    QRS Duration: 104 QT Interval:  467 QTC Calculation: 486 R Axis:   -70 Text Interpretation: Sinus or ectopic atrial rhythm Prolonged PR interval  Left anterior fascicular block Probable anterior  infarct, age indeterminate Similar appearance to prior. No sTEMI Confirmed by Antony Blackbird [phone]) on 05/29/2020 1:49:38 PM   Radiology DG Chest Portable 1 View  Result Date: 05/29/2020 CLINICAL DATA:  Loss of consciousness EXAM: PORTABLE CHEST 1 VIEW COMPARISON:  None. FINDINGS: Multiple leads overlie the patient. The patient's hands overlie the lung bases. Low lung volumes. Lungs grossly clear. No significant pleural effusion. No pneumothorax. Cardiomediastinal contours are likely within normal limits for portable technique. IMPRESSION: No acute process the chest. Electronically Signed   By: Macy Mis M.D.   On: 05/29/2020 14:04    Procedures Procedures (including critical care time)  Medications Ordered in ED Medications  sodium chloride 0.9 % bolus 500 mL (has no administration in time range)  levETIRAcetam (KEPPRA) IVPB 1000 mg/100 mL premix (1,000 mg Intravenous New Bag/Given 05/29/20 1435)    ED Course  I have reviewed the triage vital signs and the nursing notes.  Pertinent labs & imaging results that were available during my care of the patient were reviewed by me and considered in my medical decision making (see chart for details).    MDM Rules/Calculators/A&P                          NIVEAH BOERNER is a 86 y.o. female with a past medical history significant for diabetes, hypothyroidism, hypertension, anxiety, CKD, diabetic neuropathies, prior breast cancer, sundowning, and dementia with recent diagnosis of seizures 3 days ago who presents with recurrent unresponsive episode. According to EMS and family, patient was diagnosed with a seizure disorder 3 days ago when she had a syncope versus seizure episode at home. Patient came to emergency department and had a reassuring head CT and was started on Keppra and let go home. Family reports that she has been sleepy since starting the [place] but today was unresponsive at the doctor's office. She was on responsive somewhat to pain so  they sent her to the emergency department. Upon arrival, patient is answering questions appropriately and is back to her normal self. Patient did not receive the [place] today. There was concern about this being either recurrent seizure versus syncope as the patient did have incontinence of stool and urine during this episode. There was no reported shaking however. Patient reports he is feeling normal and does not member what happened. She currently denies any chest pain, shortness breath, palpitations, nausea, vomiting, urinary symptoms, headache, neck pain, neck stiffness. She denies any neurologic complaints. She is resting comfortably and is very pleasant.  On exam, no sense of trauma. Patient does have a Band-Aid covering a reportedly recent skin cancer removal on her right forehead. Nontender. Hemostatic. Otherwise, no focal neurologic deficit normal sensation and strength in extremities. Normal sensation of the face. No facial droop. Symmetric smile. Pupils are symmetric and reactive with normal extraocular movements. Clear speech. Lungs clear chest nontender. Abdomen nontender. Back nontender. Neck nontender.  Based on the patient story I am concerned that recurrent seizure versus syncopal episodes. I had a conversation with the daughter and she is concerned about sending her home given the recurrent unresponsive episodes. Patient does live alone but family lives across the street.  Spoke with neurology who agrees that this likely is an observation admission to further tease out if these are seizures or recurrent syncopal episodes. We will get an MRI as she had a CT several days ago. She will need admission for EEG and  further work-up. We will get other screening labs and Covid test. Neurology did want [place] to load her with Keppra.   Anticipate admission after some work-up is completed.  2:50 PM Neurology came to see the patient and would like MRI, MRA, carotid Dopplers, and agree with admission for  either seizure versus syncope work-up and management.  She denies AKI compared to 3 days ago with a creatinine of now 2.26.  Suspect this is related to the diarrhea and some dehydration.  Will give a small amount of fluids initially.  Chest x-ray shows no pneumonia.  We will go ahead and start admission given the recommendations of neurology to do so.       Final Clinical Impression(s) / ED Diagnoses Final diagnoses:  Altered awareness, transient  AKI (acute kidney injury) ([place])    Clinical Impression: 1. Altered awareness, transient   2. AKI (acute kidney injury) ([place])     Disposition: Admit  This note was prepared with assistance of Dragon voice recognition software. Occasional wrong-word or sound-a-like substitutions may have occurred due to the inherent limitations of voice recognition software.      Archie Atilano, Gwenyth Allegra, MD 05/29/20 [phone]

## 2020-05-29 NOTE — ED Triage Notes (Signed)
Pt in via [facility] after syncopal event while sitting in WC at doc's office. Daughter states she has been having multiple episodes of diarrhea x 3 days. She also states she had recently been started on Keppra, but it made her so sleepy, she had held today's dose. sats 90% on RA per EMS, CBG 178

## 2020-05-29 NOTE — Progress Notes (Signed)
  Progress Note   [name] WER:[id number] DOB: 07/29/1919 DOA: 05/29/2020  PCP: Cheryl Maw, MD Consultants:  [name] - podiatry Patient coming from: Home - lives alone with family very close by    [name] is a 86 y.o. female with medical history significant of HTN; HLD; DM; glaucoma; anxiety; and R breast cancer presenting with AMS.   ED Course:  Syncope vs. Seizure on the commode 3 days ago - given Keppra.  Sleepy since, unresponsive at doctor office today.  Here, awake and joking.  Covered in stool and urine.  Concern for strokes.  EEG, MRI/MRA recommended by neuro - seizure vs. CVA.  Has diarrhea and AKI, as well.     Radiological Exams on Admission: DG Chest Portable 1 View  Result Date: 05/29/2020 CLINICAL DATA:  Loss of consciousness EXAM: PORTABLE CHEST 1 VIEW COMPARISON:  None. FINDINGS: Multiple leads overlie the patient. The patient's hands overlie the lung bases. Low lung volumes. Lungs grossly clear. No significant pleural effusion. No pneumothorax. Cardiomediastinal contours are likely within normal limits for portable technique. IMPRESSION: No acute process the chest. Electronically Signed   By: Macy Mis M.D.   On: 05/29/2020 14:04    EKG: Independently reviewed.  NSR with rate 65; nonspecific ST changes with no evidence of acute ischemia   Labs on Admission: I have personally reviewed the available labs and imaging studies at the time of the admission.  Pertinent labs:   Glucose 194 BUN 34/Creatinine 2.26/GFR 17; 21/1.46/30 on 7/9 Albumin 2.9 WBC 9.5 Hgb 11.6 TSH 2.886   I was called to admit this patient.  Unfortunately, by the time I was able to call back and go to evaluate the patient, she was in MRI.  As such, the patient is deferred at this time.  She will be re-paged in an hour when she returns from MRI and will be assessed by a [facility] admitting physician at that time.     Cheryl Bongo MD [facility]   How to contact the  [facility] Attending or Consulting provider [place] or covering provider during after hours [place], for this patient?  1. Check the care team in [facility] and look for a) attending/consulting [facility] provider listed and b) the [facility] team listed 2. Log into [email or web address] and use [place]'s universal password to access. If you do not have the password, please contact the hospital operator. 3. Locate the [facility] provider you are looking for under [facility] and page to a number that you can be directly reached. 4. If you still have difficulty reaching the provider, please page the [facility] (Director on Call) for the Hospitalists listed on amion for assistance.   05/29/2020, 3:28 PM

## 2020-05-29 NOTE — ED Notes (Signed)
Multiple attempts from 3 staff for urine sample via I&O cath, no success

## 2020-05-29 NOTE — Progress Notes (Signed)
EEG complete - results pending 

## 2020-05-29 NOTE — ED Notes (Signed)
Attempted to call report

## 2020-05-29 NOTE — Progress Notes (Signed)
Late entry Per EEG Tech K. Suitor pt in MRI. RN will notify when pt is back

## 2020-05-30 ENCOUNTER — Observation Stay (HOSPITAL_COMMUNITY): Payer: Medicare Other

## 2020-05-30 DIAGNOSIS — Z79890 Hormone replacement therapy (postmenopausal): Secondary | ICD-10-CM | POA: Diagnosis not present

## 2020-05-30 DIAGNOSIS — M6281 Muscle weakness (generalized): Secondary | ICD-10-CM | POA: Diagnosis not present

## 2020-05-30 DIAGNOSIS — I129 Hypertensive chronic kidney disease with stage 1 through stage 4 chronic kidney disease, or unspecified chronic kidney disease: Secondary | ICD-10-CM | POA: Diagnosis present

## 2020-05-30 DIAGNOSIS — Z853 Personal history of malignant neoplasm of breast: Secondary | ICD-10-CM | POA: Diagnosis not present

## 2020-05-30 DIAGNOSIS — Z79899 Other long term (current) drug therapy: Secondary | ICD-10-CM | POA: Diagnosis not present

## 2020-05-30 DIAGNOSIS — E119 Type 2 diabetes mellitus without complications: Secondary | ICD-10-CM | POA: Diagnosis not present

## 2020-05-30 DIAGNOSIS — E1140 Type 2 diabetes mellitus with diabetic neuropathy, unspecified: Secondary | ICD-10-CM | POA: Diagnosis not present

## 2020-05-30 DIAGNOSIS — R402411 Glasgow coma scale score 13-15, in the field [EMT or ambulance]: Secondary | ICD-10-CM | POA: Diagnosis not present

## 2020-05-30 DIAGNOSIS — R32 Unspecified urinary incontinence: Secondary | ICD-10-CM | POA: Diagnosis present

## 2020-05-30 DIAGNOSIS — R55 Syncope and collapse: Secondary | ICD-10-CM

## 2020-05-30 DIAGNOSIS — Z7189 Other specified counseling: Secondary | ICD-10-CM | POA: Diagnosis not present

## 2020-05-30 DIAGNOSIS — Z7984 Long term (current) use of oral hypoglycemic drugs: Secondary | ICD-10-CM | POA: Diagnosis not present

## 2020-05-30 DIAGNOSIS — E039 Hypothyroidism, unspecified: Secondary | ICD-10-CM | POA: Diagnosis not present

## 2020-05-30 DIAGNOSIS — Z7401 Bed confinement status: Secondary | ICD-10-CM | POA: Diagnosis not present

## 2020-05-30 DIAGNOSIS — Z90710 Acquired absence of both cervix and uterus: Secondary | ICD-10-CM | POA: Diagnosis not present

## 2020-05-30 DIAGNOSIS — M2550 Pain in unspecified joint: Secondary | ICD-10-CM | POA: Diagnosis not present

## 2020-05-30 DIAGNOSIS — E1142 Type 2 diabetes mellitus with diabetic polyneuropathy: Secondary | ICD-10-CM | POA: Diagnosis not present

## 2020-05-30 DIAGNOSIS — I10 Essential (primary) hypertension: Secondary | ICD-10-CM | POA: Diagnosis not present

## 2020-05-30 DIAGNOSIS — R2689 Other abnormalities of gait and mobility: Secondary | ICD-10-CM | POA: Diagnosis not present

## 2020-05-30 DIAGNOSIS — Z66 Do not resuscitate: Secondary | ICD-10-CM | POA: Diagnosis present

## 2020-05-30 DIAGNOSIS — E876 Hypokalemia: Secondary | ICD-10-CM | POA: Diagnosis present

## 2020-05-30 DIAGNOSIS — N179 Acute kidney failure, unspecified: Secondary | ICD-10-CM | POA: Diagnosis not present

## 2020-05-30 DIAGNOSIS — E785 Hyperlipidemia, unspecified: Secondary | ICD-10-CM

## 2020-05-30 DIAGNOSIS — G4089 Other seizures: Secondary | ICD-10-CM | POA: Diagnosis present

## 2020-05-30 DIAGNOSIS — G9341 Metabolic encephalopathy: Secondary | ICD-10-CM | POA: Diagnosis not present

## 2020-05-30 DIAGNOSIS — Z20822 Contact with and (suspected) exposure to covid-19: Secondary | ICD-10-CM | POA: Diagnosis present

## 2020-05-30 DIAGNOSIS — R42 Dizziness and giddiness: Secondary | ICD-10-CM | POA: Diagnosis not present

## 2020-05-30 DIAGNOSIS — R4182 Altered mental status, unspecified: Secondary | ICD-10-CM | POA: Diagnosis not present

## 2020-05-30 DIAGNOSIS — R197 Diarrhea, unspecified: Secondary | ICD-10-CM | POA: Diagnosis present

## 2020-05-30 DIAGNOSIS — R159 Full incontinence of feces: Secondary | ICD-10-CM | POA: Diagnosis present

## 2020-05-30 DIAGNOSIS — R296 Repeated falls: Secondary | ICD-10-CM | POA: Diagnosis present

## 2020-05-30 DIAGNOSIS — E1122 Type 2 diabetes mellitus with diabetic chronic kidney disease: Secondary | ICD-10-CM | POA: Diagnosis present

## 2020-05-30 DIAGNOSIS — R569 Unspecified convulsions: Secondary | ICD-10-CM | POA: Diagnosis not present

## 2020-05-30 DIAGNOSIS — R52 Pain, unspecified: Secondary | ICD-10-CM | POA: Diagnosis not present

## 2020-05-30 DIAGNOSIS — D329 Benign neoplasm of meninges, unspecified: Secondary | ICD-10-CM | POA: Diagnosis present

## 2020-05-30 DIAGNOSIS — E1165 Type 2 diabetes mellitus with hyperglycemia: Secondary | ICD-10-CM | POA: Diagnosis present

## 2020-05-30 DIAGNOSIS — N1832 Chronic kidney disease, stage 3b: Secondary | ICD-10-CM | POA: Diagnosis not present

## 2020-05-30 DIAGNOSIS — F0391 Unspecified dementia with behavioral disturbance: Secondary | ICD-10-CM | POA: Diagnosis present

## 2020-05-30 DIAGNOSIS — N183 Chronic kidney disease, stage 3 unspecified: Secondary | ICD-10-CM | POA: Diagnosis not present

## 2020-05-30 LAB — COMPREHENSIVE METABOLIC PANEL
ALT: 11 U/L (ref 0–44)
AST: 13 U/L — ABNORMAL LOW (ref 15–41)
Albumin: 2.8 g/dL — ABNORMAL LOW (ref 3.5–5.0)
Alkaline Phosphatase: 57 U/L (ref 38–126)
Anion gap: 10 (ref 5–15)
BUN: 23 mg/dL (ref 8–23)
CO2: 26 mmol/L (ref 22–32)
Calcium: 8.9 mg/dL (ref 8.9–10.3)
Chloride: 105 mmol/L (ref 98–111)
Creatinine, Ser: 1.68 mg/dL — ABNORMAL HIGH (ref 0.44–1.00)
GFR calc Af Amer: 29 mL/min — ABNORMAL LOW (ref >60)
GFR calc non Af Amer: 25 mL/min — ABNORMAL LOW (ref >60)
Glucose, Bld: 178 mg/dL — ABNORMAL HIGH (ref 70–99)
Potassium: 3.1 mmol/L — ABNORMAL LOW (ref 3.5–5.1)
Sodium: 141 mmol/L (ref 135–145)
Total Bilirubin: 0.8 mg/dL (ref 0.3–1.2)
Total Protein: 6.1 g/dL — ABNORMAL LOW (ref 6.5–8.1)

## 2020-05-30 LAB — GLUCOSE, CAPILLARY
Glucose-Capillary: 173 mg/dL — ABNORMAL HIGH (ref 70–99)
Glucose-Capillary: 171 mg/dL — ABNORMAL HIGH (ref 70–99)
Glucose-Capillary: 146 mg/dL — ABNORMAL HIGH (ref 70–99)
Glucose-Capillary: 72 mg/dL (ref 70–99)
Glucose-Capillary: 154 mg/dL — ABNORMAL HIGH (ref 70–99)

## 2020-05-30 LAB — CBC
HCT: 36.2 % (ref 36.0–46.0)
Hemoglobin: 12.5 g/dL (ref 12.0–15.0)
MCH: 32.8 pg (ref 26.0–34.0)
MCHC: 34.5 g/dL (ref 30.0–36.0)
MCV: 95 fL (ref 80.0–100.0)
Platelets: 219 10*3/uL (ref 150–400)
RBC: 3.81 MIL/uL — ABNORMAL LOW (ref 3.87–5.11)
RDW: 11.6 % (ref 11.5–15.5)
WBC: 7.5 10*3/uL (ref 4.0–10.5)
nRBC: 0 % (ref 0.0–0.2)

## 2020-05-30 LAB — URINE CULTURE: Culture: NO GROWTH

## 2020-05-30 LAB — C DIFFICILE QUICK SCREEN W PCR REFLEX
C Diff antigen: NEGATIVE
C Diff interpretation: NOT DETECTED
C Diff toxin: NEGATIVE

## 2020-05-30 LAB — ECHOCARDIOGRAM COMPLETE
Height: 67 in
Weight: 2638.47 oz

## 2020-05-30 MED ORDER — SODIUM CHLORIDE 0.9 % IV SOLN: INTRAVENOUS | Status: DC

## 2020-05-30 MED ORDER — GABAPENTIN 100 MG PO CAPS
100.0000 mg | ORAL_CAPSULE | Freq: Two times a day (BID) | ORAL | Status: DC
  Administered 2020-05-30 – 2020-05-31 (×3): 100 mg via ORAL
  Filled 2020-05-30 (×3): qty 1

## 2020-05-30 MED ORDER — LOPERAMIDE HCL 2 MG PO CAPS
4.0000 mg | ORAL_CAPSULE | ORAL | Status: DC | PRN
  Administered 2020-05-30 – 2020-06-02 (×4): 4 mg via ORAL
  Filled 2020-05-30 (×4): qty 2

## 2020-05-30 MED ORDER — SENNOSIDES-DOCUSATE SODIUM 8.6-50 MG PO TABS: 2.0000 | ORAL_TABLET | Freq: Every evening | ORAL | Status: DC | PRN

## 2020-05-30 MED ORDER — HYDROCODONE-ACETAMINOPHEN 5-325 MG PO TABS: 1.0000 | ORAL_TABLET | ORAL | Status: DC | PRN

## 2020-05-30 MED ORDER — ACETAMINOPHEN 325 MG PO TABS
650.0000 mg | ORAL_TABLET | Freq: Four times a day (QID) | ORAL | Status: DC | PRN
  Administered 2020-06-02 – 2020-06-04 (×3): 650 mg via ORAL
  Filled 2020-05-30 (×3): qty 2

## 2020-05-30 MED ORDER — POTASSIUM CHLORIDE 10 MEQ/100ML IV SOLN
10.0000 meq | INTRAVENOUS | Status: AC
  Administered 2020-05-30 (×4): 10 meq via INTRAVENOUS
  Filled 2020-05-30 (×4): qty 100

## 2020-05-30 MED ORDER — POLYETHYLENE GLYCOL 3350 17 G PO PACK: 17.0000 g | PACK | Freq: Every day | ORAL | Status: DC | PRN

## 2020-05-30 NOTE — Progress Notes (Signed)
°  Echocardiogram 2D Echocardiogram has been performed.  Bobbye Charleston 05/30/2020, 10:46 AM

## 2020-05-30 NOTE — Progress Notes (Signed)
PROGRESS NOTE    [name]  QIH:[id number] DOB: 05/18/1919 DOA: 05/29/2020 PCP: [name], MD   Brief Narrative:  86 year old with history of HTN, HLD, DM 2, glaucoma, hypothyroidism, anxiety, right breast cancer presented with altered mental status.  Had recent syncope on 7/9.  Admitted for some concerns of seizures previously started on Keppra and recommended outpatient follow-up.  On the day of admission again found to be unresponsive therefore brought to the hospital   Assessment & Plan:   Principal Problem:   Altered mental status Active Problems:   Hypothyroidism   Diabetes mellitus type 2, noninsulin dependent ([place])   Essential hypertension   Hyperlipidemia   CKD (chronic kidney disease) stage 3, GFR 30-59 ml/min   Dementia with behavioral disturbance (HCC)   DNR (do not resuscitate) discussion   Altered mental status, concern for CVA versus seizures Syncope, cardiogenic? History of dementia with occasional behavioral disturbances -CT head- -MRI brain-negative, chronic small vessel ischemic changes, many associated with hemosiderin deposition, incidental finding of meningioma without any significant mass-effect -UA-negative -EEG-negative for any obvious epileptic discharges -TSH-Normal -Ammonia-normal -Keppra increased to 500 mg twice daily -Echocardiogram 2019-EF 87%, grade 1 diastolic dysfunction. Repeat Echo.  D/c Ativan, morphine and bedtime ambien. Reduce oxycodone to 5mg  q4hrs prn.  Diarrhea, nonspecific non bloody causing mild dehydration Ordered C diff, pending collection. IVF  Incidental finding of meningioma -No significant mass-effect seen on the brain.  Outpatient follow-up.  Acute kidney injury on CKD stage IIIb -Baseline creatinine 1.4, admission creatinine 2.2.  Continue gentle hydration and monitor urine output.  Diabetes mellitus type 2 Peripheral neuropathy secondary to DM2 -Sliding scale and Accu-Chek -A1c-pending -Reduce  gabapentin to 100mg  po bid.   Hypothyroidism -Continue Synthroid -TSH-normal  Essential hypertension -My card is on hold due to AKI -Continue Norvasc and acebutolol  Hyperlipidemia -Statin  Goals of care discussion -Patient is DNR/DNI   DVT prophylaxis: enoxaparin (LOVENOX) injection 30 mg Start: 05/29/20 1715  Code Status: DNR Family Communication:  Spoke with her daugther  Dispo: The patient is from: Home              Anticipated d/c is to: Home              Anticipated d/c date is: 1 day              Patient currently is not medically stable to d/c. Ongoing evaluation for Syncope/diarrhea. Check C diff. Awaiting mentation to improve so she can adequately take her po meds and meals. D/c sedative meds and allow wash out.     Body mass index is 25.83 kg/m.      Subjective: Drowsy this morning but answers basic questions. She is alert to name and place. Denies any complaints. Poor po intake. Dry lips  Per daughter at baseline, she is AAOx3, uses walker, no longer drives. Over last week or so she has been drowsy with poor po intake.   Review of Systems Otherwise negative except as per HPI, including: General = no fevers, chills, dizziness,  fatigue HEENT/EYES = negative for loss of vision, double vision, blurred vision,  sore throa Cardiovascular= negative for chest pain, palpitation Respiratory/lungs= negative for shortness of breath, cough, wheezing; hemoptysis,  Gastrointestinal= negative for nausea, vomiting, abdominal pain Genitourinary= negative for Dysuria MSK = Negative for arthralgia, myalgias Neurology= Negative for headache, numbness, tingling  Psychiatry= Negative for suicidal and homocidal ideation Skin= Negative for Rash   Examination:  Constitutional: elderly frail, dry lips.  Respiratory:  Clear to auscultation bilaterally Cardiovascular: Normal sinus rhythm, no rubs Abdomen: Nontender nondistended good bowel sounds Musculoskeletal: No edema  noted Skin: No rashes seen Neurologic: CN 2-12 grossly intact.  And nonfocal Psychiatric: Poor judgement and insignt. AAox3  Objective: Vitals:   05/29/20 2030 05/29/20 2106 05/29/20 2243 05/30/20 0412  BP:  (!) 148/72 (!) 158/72 (!) 154/87  Pulse: 73  74 72  Resp:   20 18  Temp:   98.8 F (37.1 C) 98.2 F (36.8 C)  TempSrc:   Oral Oral  SpO2: 93%  93% 94%  Weight:        Intake/Output Summary (Last 24 hours) at 05/30/2020 0753 Last data filed at 05/30/2020 0700 Gross per 24 hour  Intake 608.63 ml  Output 800 ml  Net -191.37 ml   Filed Weights   05/29/20 1415  Weight: 74.8 kg     Data Reviewed:   CBC: Recent Labs  Lab 05/26/20 1300 05/29/20 1400  WBC 11.8* 9.5  NEUTROABS  --  7.0  HGB 13.7 11.6*  HCT 39.7 34.2*  MCV 96.1 96.6  PLT 218 025   Basic Metabolic Panel: Recent Labs  Lab 05/26/20 1300 05/29/20 1400  NA 138 135  K 3.9 3.5  CL 103 100  CO2 26 22  GLUCOSE 239* 194*  BUN 21 34*  CREATININE 1.46* 2.26*  CALCIUM 9.4 8.6*   GFR: Estimated Creatinine Clearance: 14.7 mL/min (A) (by C-G formula based on SCr of 2.26 mg/dL (H)). Liver Function Tests: Recent Labs  Lab 05/29/20 1400  AST 13*  ALT 11  ALKPHOS 56  BILITOT 0.6  PROT 5.9*  ALBUMIN 2.9*   No results for input(s): LIPASE, AMYLASE in the last 168 hours. Recent Labs  Lab 05/29/20 1400  AMMONIA 22   Coagulation Profile: No results for input(s): INR, PROTIME in the last 168 hours. Cardiac Enzymes: No results for input(s): CKTOTAL, CKMB, CKMBINDEX, TROPONINI in the last 168 hours. BNP (last 3 results) No results for input(s): PROBNP in the last 8760 hours. HbA1C: No results for input(s): HGBA1C in the last 72 hours. CBG: Recent Labs  Lab 05/29/20 1328 05/29/20 1725 05/29/20 2002 05/29/20 2355 05/30/20 0624  GLUCAP 169* 158* 141* 142* 173*   Lipid Profile: No results for input(s): CHOL, HDL, LDLCALC, TRIG, CHOLHDL, LDLDIRECT in the last 72 hours. Thyroid Function  Tests: Recent Labs    05/29/20 1400  TSH 2.886   Anemia Panel: No results for input(s): VITAMINB12, FOLATE, FERRITIN, TIBC, IRON, RETICCTPCT in the last 72 hours. Sepsis Labs: No results for input(s): PROCALCITON, LATICACIDVEN in the last 168 hours.  Recent Results (from the past 240 hour(s))  SARS Coronavirus 2 by RT PCR (hospital order, performed in [facility] hospital lab) Nasopharyngeal Nasopharyngeal Swab     Status: None   Collection Time: 05/29/20  2:06 PM   Specimen: Nasopharyngeal Swab  Result Value Ref Range Status   SARS Coronavirus 2 NEGATIVE NEGATIVE Final    Comment: (NOTE) SARS-CoV-2 target nucleic acids are NOT DETECTED.  The SARS-CoV-2 RNA is generally detectable in upper and lower respiratory specimens during the acute phase of infection. The lowest concentration of SARS-CoV-2 viral copies this assay can detect is 250 copies / mL. A negative result does not preclude SARS-CoV-2 infection and should not be used as the sole basis for treatment or other patient management decisions.  A negative result may occur with improper specimen collection / handling, submission of specimen other than nasopharyngeal swab, presence of viral mutation(s) within  the areas targeted by this assay, and inadequate number of viral copies (<250 copies / mL). A negative result must be combined with clinical observations, patient history, and epidemiological information.  Fact Sheet for Patients:   StrictlyIdeas.no  Fact Sheet for Healthcare Providers: BankingDealers.co.za  This test is not yet approved or  cleared by the [place] FDA and has been authorized for detection and/or diagnosis of SARS-CoV-2 by FDA under an Emergency Use Authorization (EUA).  This EUA will remain in effect (meaning this test can be used) for the duration of the COVID-19 declaration under Section 564(b)(1) of the Act, 21 U.S.C. section 360bbb-3(b)(1),  unless the authorization is terminated or revoked sooner.  Performed at [facility], [place] [address], [place], [place] [address]          Radiology Studies: EEG  Result Date: 05/29/2020 Lora Havens, MD     05/29/2020  5:53 PM Patient Name: [name] MRN: [id number] Epilepsy Attending: Lora Havens Referring Physician/Provider: Dr Karmen Bongo Date: 07/30/2020 Duration: 23.25 mins Patient history:  86 year old female presenting to the hospital with her second episode of unresponsiveness. EEG to evaluate for seizure. Level of alertness: Awake AEDs during EEG study: gabapentin, LEV Technical aspects: This EEG study was done with scalp electrodes positioned according to the 10-20 International system of electrode placement. Electrical activity was acquired at a sampling rate of 500Hz  and reviewed with a high frequency filter of 70Hz  and a low frequency filter of 1Hz . EEG data were recorded continuously and digitally stored. Description: No clear posterior dominant rhythm consists of 9-10 Hz activity of moderate voltage (25-35 uV) seen predominantly in posterior head regions, symmetric and reactive to eye opening and eye closing. EEG showed continuous generalized 5 to 8 Hz theta-alpha activity. Hyperventilation and photic stimulation were not performed.   ABNORMALITY -Continuous slow, generalized IMPRESSION: This study is suggestive of mild to moderate diffuse encephalopathy, nonspecific etiology. No seizures or epileptiform discharges were seen throughout the recording. Lora Havens   MR BRAIN WO CONTRAST  Result Date: 05/29/2020 CLINICAL DATA:  Altered mental status of unknown etiology. EXAM: MRI HEAD WITHOUT CONTRAST TECHNIQUE: Multiplanar, multiecho pulse sequences of the brain and surrounding structures were obtained without intravenous contrast. COMPARISON:  Head CT 05/26/2020 FINDINGS: Brain: Diffusion imaging does not show any acute or subacute infarction. Mild chronic  small-vessel change affects the pons. No focal cerebellar finding. Cerebral hemispheres show age related atrophy with moderate chronic small-vessel ischemic changes of the white matter. No large vessel territory infarction. No intra-axial mass lesion, acute hemorrhage, hydrocephalus or extra-axial collection. There are scattered punctate foci of hemosiderin deposition associated with some of the old small vessel insults. There is a meningioma along the left side of the falx with a diameter of 15 mm and a thickness of 8 mm, incidental, not having any mass-effect upon the brain. Vascular: Major vessels at the base of the brain show flow. Skull and upper cervical spine: Negative Sinuses/Orbits: Clear/normal Other: None IMPRESSION: No acute or reversible finding. Age related atrophy. Chronic small-vessel ischemic changes throughout the brain, many associated with punctate hemosiderin deposition. No evidence of recent hemorrhage. Incidental meningioma along the left side of the falx with a diameter of 15 mm in thickness of 8 mm, not having any significant mass-effect upon the brain. Electronically Signed   By: Nelson Chimes M.D.   On: 05/29/2020 16:34   DG Chest Portable 1 View  Result Date: 05/29/2020 CLINICAL DATA:  Loss of consciousness  EXAM: PORTABLE CHEST 1 VIEW COMPARISON:  None. FINDINGS: Multiple leads overlie the patient. The patient's hands overlie the lung bases. Low lung volumes. Lungs grossly clear. No significant pleural effusion. No pneumothorax. Cardiomediastinal contours are likely within normal limits for portable technique. IMPRESSION: No acute process the chest. Electronically Signed   By: Macy Mis M.D.   On: 05/29/2020 14:04        Scheduled Meds: . acebutolol  200 mg Oral QHS  . amLODipine  5 mg Oral BH-q7a  . atorvastatin  10 mg Oral Daily  . docusate sodium  100 mg Oral BID  . enoxaparin (LOVENOX) injection  30 mg Subcutaneous Q24H  . gabapentin  100 mg Oral QHS  .  gabapentin  200 mg Oral Daily  . insulin aspart  0-15 Units Subcutaneous TID WC  . insulin aspart  0-5 Units Subcutaneous QHS  . levothyroxine  100 mcg Oral Q0600   Continuous Infusions: . sodium chloride Stopped (05/30/20 0623)  . levETIRAcetam Stopped (05/29/20 2336)     LOS: 0 days   Time spent= 35 mins    Yaneth Fairbairn Arsenio Loader, MD [facility]  If 7PM-7AM, please contact night-coverage  05/30/2020, 7:53 AM

## 2020-05-30 NOTE — Progress Notes (Addendum)
Neurology Progress Note   S:// Seen and examined.  More awake alert and oriented today. Reports feeling uncomfortable in bed and wanting more bedsheets/covers On enteric precautions overnight-question of loose stools in the ER.  C. difficile test pending  O:// Current vital signs: BP (!) 154/87 (BP Location: Right Arm)   Pulse 72   Temp 98.2 F (36.8 C) (Oral)   Resp 18   Wt 74.8 kg   SpO2 94%   BMI 25.83 kg/m  Vital signs in last 24 hours: Temp:  [97.5 F (36.4 C)-98.8 F (37.1 C)] 98.2 F (36.8 C) (07/13 0412) Pulse Rate:  [60-112] 72 (07/13 0412) Resp:  [15-22] 18 (07/13 0412) BP: (102-158)/(53-123) 154/87 (07/13 0412) SpO2:  [88 %-98 %] 94 % (07/13 0412) Weight:  [74.8 kg] 74.8 kg (07/12 1415) General: Awake alert in no distress HEENT: Normocephalic, right frontal area of the head has a scab, dry oral mucous membranes CVS: Regular rate rhythm Respiratory: Breathing well saturating normally on room air Abdomen soft nondistended nontender Extremities warm well perfused with no edema Neurological exam She is awake, alert, oriented to self, fact that she is at [facility], and its July 2021. Her speech is clear Naming comprehension and repetition is intact Attention concentration is mildly reduced Cranial nerves: Pupils equal round reactive light, extraocular movements intact, visual fields full, facial sensation intact, face symmetric, auditory acuity mildly reduced bilaterally, tongue and palate midline. Motor exam: Antigravity 5/5 in all 4 extremities Sensory exam: Intact to light touch all over Coordination: No dysmetria noted  Medications  Current Facility-Administered Medications:  .  0.9 %  sodium chloride infusion, 75 mL/hr, Intravenous, Continuous, Karmen Bongo, MD, Paused at 05/30/20 [phone] .  acebutolol (SECTRAL) capsule 200 mg, 200 mg, Oral, QHS, Karmen Bongo, MD .  acetaminophen (TYLENOL) tablet 650 mg, 650 mg, Oral, Q6H PRN **OR**  acetaminophen (TYLENOL) suppository 650 mg, 650 mg, Rectal, Q6H PRN, Karmen Bongo, MD .  amLODipine (NORVASC) tablet 5 mg, 5 mg, Oral, Ledell Noss, MD, 5 mg at 05/30/20 0532 .  atorvastatin (LIPITOR) tablet 10 mg, 10 mg, Oral, Daily, Karmen Bongo, MD .  bisacodyl (DULCOLAX) EC tablet 5 mg, 5 mg, Oral, Daily PRN, Karmen Bongo, MD .  docusate sodium (COLACE) capsule 100 mg, 100 mg, Oral, BID, Karmen Bongo, MD, 100 mg at 05/29/20 2322 .  enoxaparin (LOVENOX) injection 30 mg, 30 mg, Subcutaneous, Q24H, Karmen Bongo, MD .  gabapentin (NEURONTIN) capsule 100 mg, 100 mg, Oral, QHS, Karmen Bongo, MD, 100 mg at 05/29/20 2321 .  gabapentin (NEURONTIN) capsule 200 mg, 200 mg, Oral, Daily, Duanne Limerick, RPH .  hydrALAZINE (APRESOLINE) injection 5 mg, 5 mg, Intravenous, Q4H PRN, Karmen Bongo, MD .  HYDROcodone-acetaminophen (NORCO/VICODIN) 5-325 MG per tablet 1-2 tablet, 1-2 tablet, Oral, Q4H PRN, Karmen Bongo, MD .  insulin aspart (novoLOG) injection 0-15 Units, 0-15 Units, Subcutaneous, TID WC, Karmen Bongo, MD .  insulin aspart (novoLOG) injection 0-5 Units, 0-5 Units, Subcutaneous, QHS, Karmen Bongo, MD .  levETIRAcetam (KEPPRA) IVPB 500 mg/100 mL premix, 500 mg, Intravenous, Q12H, Karmen Bongo, MD, Stopped at 05/29/20 2336 .  levothyroxine (SYNTHROID) tablet 100 mcg, 100 mcg, Oral, Q0600, Karmen Bongo, MD, 100 mcg at 05/30/20 0531 .  LORazepam (ATIVAN) injection 1-2 mg, 1-2 mg, Intravenous, Q2H PRN, Karmen Bongo, MD .  morphine 2 MG/ML injection 2 mg, 2 mg, Intravenous, Q2H PRN, Karmen Bongo, MD .  ondansetron [facility]) tablet 4 mg, 4 mg, Oral, Q6H PRN **OR** ondansetron (ZOFRAN) injection 4  mg, 4 mg, Intravenous, Q6H PRN, Karmen Bongo, MD .  polyethylene glycol (MIRALAX / GLYCOLAX) packet 17 g, 17 g, Oral, Daily PRN, Karmen Bongo, MD .  polyvinyl alcohol (LIQUIFILM TEARS) 1.4 % ophthalmic solution 1 drop, 1 drop, Both Eyes, TID PRN, Karmen Bongo, MD .  zolpidem Lorrin Mais) tablet 5 mg, 5 mg, Oral, QHS PRN, Karmen Bongo, MD Labs CBC    Component Value Date/Time   WBC 9.5 05/29/2020 1400   RBC 3.54 (L) 05/29/2020 1400   HGB 11.6 (L) 05/29/2020 1400   HGB 13.3 01/04/2016 1218   HCT 34.2 (L) 05/29/2020 1400   HCT 39.5 01/04/2016 1218   PLT 196 05/29/2020 1400   PLT 238 01/04/2016 1218   MCV 96.6 05/29/2020 1400   MCV 95.3 01/04/2016 1218   MCH 32.8 05/29/2020 1400   MCHC 33.9 05/29/2020 1400   RDW 11.9 05/29/2020 1400   RDW 12.2 01/04/2016 1218   LYMPHSABS 1.5 05/29/2020 1400   LYMPHSABS 2.0 01/04/2016 1218   MONOABS 0.8 05/29/2020 1400   MONOABS 0.7 01/04/2016 1218   EOSABS 0.1 05/29/2020 1400   EOSABS 0.2 01/04/2016 1218   BASOSABS 0.0 05/29/2020 1400   BASOSABS 0.1 01/04/2016 1218    CMP     Component Value Date/Time   NA 135 05/29/2020 1400   NA 139 08/13/2019 1444   NA 139 01/04/2016 1218   K 3.5 05/29/2020 1400   K 4.0 01/04/2016 1218   CL 100 05/29/2020 1400   CL 104 12/10/2012 1145   CO2 22 05/29/2020 1400   CO2 28 01/04/2016 1218   GLUCOSE 194 (H) 05/29/2020 1400   GLUCOSE 148 (H) 01/04/2016 1218   GLUCOSE 149 (H) 12/10/2012 1145   BUN 34 (H) 05/29/2020 1400   BUN 18 08/13/2019 1444   BUN 30.5 (H) 01/04/2016 1218   CREATININE 2.26 (H) 05/29/2020 1400   CREATININE 1.5 (H) 01/04/2016 1218   CALCIUM 8.6 (L) 05/29/2020 1400   CALCIUM 9.8 01/04/2016 1218   PROT 5.9 (L) 05/29/2020 1400   PROT 7.4 01/04/2016 1218   ALBUMIN 2.9 (L) 05/29/2020 1400   ALBUMIN 3.8 01/04/2016 1218   AST 13 (L) 05/29/2020 1400   AST 17 01/04/2016 1218   ALT 11 05/29/2020 1400   ALT 16 01/04/2016 1218   ALKPHOS 56 05/29/2020 1400   ALKPHOS 67 01/04/2016 1218   BILITOT 0.6 05/29/2020 1400   BILITOT 0.69 01/04/2016 1218   GFRNONAA 17 (L) 05/29/2020 1400   GFRAA 20 (L) 05/29/2020 1400      Imaging I have reviewed images in epic and the results pertinent to this consultation are: MRI of the brain with no  acute stroke. Chronic small vessel ischemic changes, many associated with punctate hemosiderin deposition.  No evidence of recent hemorrhage.  Likely related to aging as well as chronic hypertension. Incidental meningioma on the left side of the falx with a 15 mm diameter, with no significant mass-effect upon the brain.  EEG with generalized slowing-mild to moderate diffuse encephalopathy-nonspecific.  No seizures or epileptiform discharges seen throughout the recording.  UA and chest x-ray unremarkable for any evidence of UTI or pneumonia.  Assessment: 86 year old woman presented to the hospital with second episode of unresponsiveness yesterday.  Prior episode was associated with a staring spell and the right arm reaching out and some limb movement concerning for seizures. Started on Keppra 250 twice daily in the ER on the prior visit, increased to [place] 500 twice daily yesterday. Question seizures versus syncopal episode. Given  her advanced age and neurocognitive decline over time, seizures are a possibility and I would continue her on the antiepileptics but at this time, I feel that she might have had a syncopal episode-also has had diarrhea-might have been dehydrated on presentation. She is also on multiple sedating medications including hydrocodone, gabapentin and zolpidem. They might be contributing to her diminished level of consciousness or intermittent somnolence.  Impression: Multifactorial toxic metabolic encephalopathy Evaluate for syncope Evaluate for seizure Evaluate for polypharmacy   Recommendations: -I would continue her on Keppra 500 twice daily -please check orthostatic vitals -Check C. Difficile -Check echocardiogram -Pending carotid Dopplers.  MRI brain unremarkable.  MRA-she could not tolerate. -Continue to maintain seizure precautions -I would recommend reducing sedating medications as much as possible.  In the setting of deranged renal function, even low doses of  gabapentin, and elderly can have sedating effects as well as can produce myoclonic jerking looking movements. -Plan on outpatient follow-up with neurology in 4 to 6 weeks.  -- Amie Portland, MD [facility] Pager: [phone] If 7pm to 7am, please call on call as listed on AMION.

## 2020-05-31 ENCOUNTER — Inpatient Hospital Stay (HOSPITAL_COMMUNITY): Payer: Medicare Other

## 2020-05-31 DIAGNOSIS — G9340 Encephalopathy, unspecified: Secondary | ICD-10-CM

## 2020-05-31 DIAGNOSIS — R55 Syncope and collapse: Secondary | ICD-10-CM

## 2020-05-31 DIAGNOSIS — N179 Acute kidney failure, unspecified: Secondary | ICD-10-CM

## 2020-05-31 LAB — COMPREHENSIVE METABOLIC PANEL
ALT: 13 U/L (ref 0–44)
AST: 18 U/L (ref 15–41)
Albumin: 3 g/dL — ABNORMAL LOW (ref 3.5–5.0)
Alkaline Phosphatase: 60 U/L (ref 38–126)
Anion gap: 11 (ref 5–15)
BUN: 15 mg/dL (ref 8–23)
CO2: 24 mmol/L (ref 22–32)
Calcium: 8.8 mg/dL — ABNORMAL LOW (ref 8.9–10.3)
Chloride: 104 mmol/L (ref 98–111)
Creatinine, Ser: 1.39 mg/dL — ABNORMAL HIGH (ref 0.44–1.00)
GFR calc Af Amer: 36 mL/min — ABNORMAL LOW (ref >60)
GFR calc non Af Amer: 31 mL/min — ABNORMAL LOW (ref >60)
Glucose, Bld: 165 mg/dL — ABNORMAL HIGH (ref 70–99)
Potassium: 3 mmol/L — ABNORMAL LOW (ref 3.5–5.1)
Sodium: 139 mmol/L (ref 135–145)
Total Bilirubin: 0.6 mg/dL (ref 0.3–1.2)
Total Protein: 6.3 g/dL — ABNORMAL LOW (ref 6.5–8.1)

## 2020-05-31 LAB — CBC
HCT: 37.6 % (ref 36.0–46.0)
Hemoglobin: 13 g/dL (ref 12.0–15.0)
MCH: 32.8 pg (ref 26.0–34.0)
MCHC: 34.6 g/dL (ref 30.0–36.0)
MCV: 94.9 fL (ref 80.0–100.0)
Platelets: 226 10*3/uL (ref 150–400)
RBC: 3.96 MIL/uL (ref 3.87–5.11)
RDW: 11.5 % (ref 11.5–15.5)
WBC: 9.3 10*3/uL (ref 4.0–10.5)
nRBC: 0 % (ref 0.0–0.2)

## 2020-05-31 LAB — GLUCOSE, CAPILLARY
Glucose-Capillary: 154 mg/dL — ABNORMAL HIGH (ref 70–99)
Glucose-Capillary: 136 mg/dL — ABNORMAL HIGH (ref 70–99)
Glucose-Capillary: 136 mg/dL — ABNORMAL HIGH (ref 70–99)
Glucose-Capillary: 108 mg/dL — ABNORMAL HIGH (ref 70–99)

## 2020-05-31 LAB — MAGNESIUM: Magnesium: 1.6 mg/dL — ABNORMAL LOW (ref 1.7–2.4)

## 2020-05-31 MED ORDER — HALOPERIDOL 0.5 MG PO TABS
0.5000 mg | ORAL_TABLET | Freq: Once | ORAL | Status: AC | PRN
  Administered 2020-05-31: 0.5 mg via ORAL
  Filled 2020-05-31 (×2): qty 1

## 2020-05-31 MED ORDER — HALOPERIDOL LACTATE 5 MG/ML IJ SOLN: 1.0000 mg | Freq: Once | INTRAMUSCULAR | Status: AC | PRN

## 2020-05-31 MED ORDER — HALOPERIDOL LACTATE 5 MG/ML IJ SOLN: 1.0000 mg | Freq: Four times a day (QID) | INTRAMUSCULAR | Status: DC | PRN

## 2020-05-31 MED ORDER — MAGNESIUM SULFATE 2 GM/50ML IV SOLN
2.0000 g | Freq: Once | INTRAVENOUS | Status: AC
  Administered 2020-05-31: 2 g via INTRAVENOUS
  Filled 2020-05-31: qty 50

## 2020-05-31 MED ORDER — POTASSIUM CHLORIDE CRYS ER 20 MEQ PO TBCR
40.0000 meq | EXTENDED_RELEASE_TABLET | Freq: Two times a day (BID) | ORAL | Status: AC
  Administered 2020-05-31 – 2020-06-01 (×3): 40 meq via ORAL
  Filled 2020-05-31 (×3): qty 2

## 2020-05-31 NOTE — Progress Notes (Signed)
Patient is confused, trying to get out of bed, wants to leave, and is difficult to redirect.   Recommend sitter, lights off at night, TV off or on soft music or white noise, address patient gently, avoid overnight interruptions, avoid restraints.   Unfortunately, there are no sitters available. Try to avoid sedating medications but may have to give a small dose of Haldol for the patient's safety if she continues to try to get of bed and leave.

## 2020-05-31 NOTE — Progress Notes (Signed)
Carotid duplex  has been completed. Refer to [facility] under chart review to view preliminary results.   05/31/2020  11:44 AM [place]ia [place], Bonnye Fava

## 2020-05-31 NOTE — Progress Notes (Signed)
[name]  PPJ:[id number] DOB: [birth year] DOA: 05/29/2020 PCP: [name], MD    Brief Narrative:  86 year old with a history of HTN, HLD, DM2, glaucoma, hypothyroidism, anxiety disorder, and breast cancer who presented to the ED with altered mental status.  She was admitted to the hospital 05/26/2020 with an episode of syncope at which time there was concern she may have suffered a seizure and she was placed on Keppra.  She returned to the hospital 7/12 with another episode of altered mental status after becoming unresponsive in the waiting area of her PCPs office.  Significant Events: 7/13 TTE -EF 65-70% -grade 1 DD  Antimicrobials:  None  Subjective: Sedate during exam. Unable to provide a hx. Spoke w/ daughter at bedside. No evidence of acute distress.   Assessment & Plan:  Recurrent syncopal episodes -possible seizure activity -altered mental status CT head unrevealing -MRI brain noting only chronic small vessel ischemic change -UA unremarkable -EEG did not reveal epileptic discharges at time of testing -TSH normal -ammonia normal -Keppra increased per neurology -discontinuing/decreasing all sedating medications -TTE unremarkable -unable to tolerate MRA - ?related to neurontin; have now discontinued it fully   Nonbloody diarrhea POA C. difficile testing negative - diarrhea appears to be resolving per daughter   Incidental meningioma Noted on MRI brain and exhibiting no significant mass-effect  Acute kidney injury on CKD stage IIIb Baseline creatinine 1.4 with creatinine 2.2 at admission -likely due to poor intake and increased GI losses in setting of diarrhea - gently hydrate and recheck in AM  Hypokalemia Due to poor intake and GI losses -supplement and follow  Hypomagnesemia Due to poor intake and GI losses -supplement and follow  DM2 uncontrolled with peripheral neuropathy Discontinue Neurontin altogether due to concern it could be related to  above  Hypothyroidism Continue Synthroid -TSH in normal range  Essential hypertension Blood pressure variable -may soon require further titration of medication   DVT prophylaxis: Lovenox Code Status: No code Family Communication: Spoke with daughter at bedside Status is: Inpatient  Remains inpatient appropriate because:Inpatient level of care appropriate due to severity of illness   Dispo: The patient is from: Home              Anticipated d/c is to: Unclear              Anticipated d/c date is: 2 days              Patient currently is not medically stable to d/c.   Consultants:  Neurology  Objective: Blood pressure (!) 174/66, pulse 65, temperature 97.8 F (36.6 C), temperature source Oral, resp. rate 18, weight 74.8 kg, SpO2 96 %.  Intake/Output Summary (Last 24 hours) at 05/31/2020 0931 Last data filed at 05/30/2020 2240 Gross per 24 hour  Intake 700.47 ml  Output 1050 ml  Net -349.53 ml   Filed Weights   05/29/20 1415  Weight: 74.8 kg    Examination: General: No acute respiratory distress Lungs: Clear to auscultation bilaterally without wheezes or crackles Cardiovascular: Regular rate and rhythm without murmur gallop or rub normal S1 and S2 Abdomen: Nontender, nondistended, soft, bowel sounds positive, no rebound, no ascites, no appreciable mass Extremities: No significant cyanosis, clubbing, or edema bilateral lower extremities  CBC: Recent Labs  Lab 05/29/20 1400 05/30/20 0820 05/31/20 0545  WBC 9.5 7.5 9.3  NEUTROABS 7.0  --   --   HGB 11.6* 12.5 13.0  HCT 34.2* 36.2 37.6  MCV 96.6 95.0 94.9  PLT 196 219 338   Basic Metabolic Panel: Recent Labs  Lab 05/29/20 1400 05/30/20 0820 05/31/20 0545  NA 135 141 139  K 3.5 3.1* 3.0*  CL 100 105 104  CO2 22 26 24   GLUCOSE 194* 178* 165*  BUN 34* 23 15  CREATININE 2.26* 1.68* 1.39*  CALCIUM 8.6* 8.9 8.8*  MG  --   --  1.6*   GFR: Estimated Creatinine Clearance: 23.9 mL/min (A) (by C-G formula  based on SCr of 1.39 mg/dL (H)).  Liver Function Tests: Recent Labs  Lab 05/29/20 1400 05/30/20 0820 05/31/20 0545  AST 13* 13* 18  ALT 11 11 13   ALKPHOS 56 57 60  BILITOT 0.6 0.8 0.6  PROT 5.9* 6.1* 6.3*  ALBUMIN 2.9* 2.8* 3.0*   No results for input(s): LIPASE, AMYLASE in the last 168 hours. Recent Labs  Lab 05/29/20 1400  AMMONIA 22    HbA1C: Hgb A1c MFr Bld  Date/Time Value Ref Range Status  01/31/2020 04:27 PM 6.3 4.6 - 6.5 % Final    Comment:    Glycemic Control Guidelines for People with Diabetes:Non Diabetic:  <6%Goal of Therapy: <7%Additional Action Suggested:  >8%   06/08/2019 10:30 AM 6.8 (H) 4.6 - 6.5 % Final    Comment:    Glycemic Control Guidelines for People with Diabetes:Non Diabetic:  <6%Goal of Therapy: <7%Additional Action Suggested:  >8%     CBG: Recent Labs  Lab 05/30/20 0828 05/30/20 1204 05/30/20 1727 05/30/20 2052 05/31/20 0630  GLUCAP 171* 146* 72 154* 154*    Recent Results (from the past 240 hour(s))  SARS Coronavirus 2 by RT PCR (hospital order, performed in [facility] hospital lab) Nasopharyngeal Nasopharyngeal Swab     Status: None   Collection Time: 05/29/20  2:06 PM   Specimen: Nasopharyngeal Swab  Result Value Ref Range Status   SARS Coronavirus 2 NEGATIVE NEGATIVE Final    Comment: (NOTE) SARS-CoV-2 target nucleic acids are NOT DETECTED.  The SARS-CoV-2 RNA is generally detectable in upper and lower respiratory specimens during the acute phase of infection. The lowest concentration of SARS-CoV-2 viral copies this assay can detect is 250 copies / mL. A negative result does not preclude SARS-CoV-2 infection and should not be used as the sole basis for treatment or other patient management decisions.  A negative result may occur with improper specimen collection / handling, submission of specimen other than nasopharyngeal swab, presence of viral mutation(s) within the areas targeted by this assay, and inadequate number of  viral copies (<250 copies / mL). A negative result must be combined with clinical observations, patient history, and epidemiological information.  Fact Sheet for Patients:   StrictlyIdeas.no  Fact Sheet for Healthcare Providers: BankingDealers.co.za  This test is not yet approved or  cleared by the [place] FDA and has been authorized for detection and/or diagnosis of SARS-CoV-2 by FDA under an Emergency Use Authorization (EUA).  This EUA will remain in effect (meaning this test can be used) for the duration of the COVID-19 declaration under Section 564(b)(1) of the Act, 21 U.S.C. section 360bbb-3(b)(1), unless the authorization is terminated or revoked sooner.  Performed at [facility], [place] [address] [place], [place] [address]   Urine culture     Status: None   Collection Time: 05/29/20  6:10 PM   Specimen: Urine, Random  Result Value Ref Range Status   Specimen Description URINE, RANDOM  Final   Special Requests NONE  Final   Culture  Final    NO GROWTH Performed at [facility], [place] [address] [place], [place] [address]    Report Status 05/30/2020 FINAL  Final  C Difficile Quick Screen w PCR reflex     Status: None   Collection Time: 05/30/20  1:22 PM   Specimen: STOOL  Result Value Ref Range Status   C Diff antigen NEGATIVE NEGATIVE Final   C Diff toxin NEGATIVE NEGATIVE Final   C Diff interpretation No C. difficile detected.  Final    Comment: Performed at [facility], [place] [address], [place], [place] [address]     Scheduled Meds: . acebutolol  200 mg Oral QHS  . amLODipine  5 mg Oral BH-q7a  . atorvastatin  10 mg Oral Daily  . docusate sodium  100 mg Oral BID  . enoxaparin (LOVENOX) injection  30 mg Subcutaneous Q24H  . gabapentin  100 mg Oral BID  . insulin aspart  0-15 Units Subcutaneous TID WC  . insulin aspart  0-5 Units Subcutaneous QHS  . levothyroxine  100 mcg Oral Q0600    Continuous Infusions: . sodium chloride 75 mL/hr at 05/31/20 0704  . levETIRAcetam 500 mg (05/31/20 0905)     LOS: 1 day   Cherene Altes, MD [facility] Office  [phone] Pager - Text Page per Amion  If 7PM-7AM, please contact night-coverage per Amion 05/31/2020, 9:31 AM

## 2020-06-01 LAB — BASIC METABOLIC PANEL
Anion gap: 11 (ref 5–15)
BUN: 17 mg/dL (ref 8–23)
CO2: 23 mmol/L (ref 22–32)
Calcium: 8.7 mg/dL — ABNORMAL LOW (ref 8.9–10.3)
Chloride: 105 mmol/L (ref 98–111)
Creatinine, Ser: 1.48 mg/dL — ABNORMAL HIGH (ref 0.44–1.00)
GFR calc Af Amer: 34 mL/min — ABNORMAL LOW (ref >60)
GFR calc non Af Amer: 29 mL/min — ABNORMAL LOW (ref >60)
Glucose, Bld: 169 mg/dL — ABNORMAL HIGH (ref 70–99)
Potassium: 3.3 mmol/L — ABNORMAL LOW (ref 3.5–5.1)
Sodium: 139 mmol/L (ref 135–145)

## 2020-06-01 LAB — FOLATE: Folate: 36.5 ng/mL (ref >5.9)

## 2020-06-01 LAB — GLUCOSE, CAPILLARY
Glucose-Capillary: 143 mg/dL — ABNORMAL HIGH (ref 70–99)
Glucose-Capillary: 248 mg/dL — ABNORMAL HIGH (ref 70–99)
Glucose-Capillary: 182 mg/dL — ABNORMAL HIGH (ref 70–99)
Glucose-Capillary: 157 mg/dL — ABNORMAL HIGH (ref 70–99)

## 2020-06-01 LAB — MAGNESIUM: Magnesium: 2 mg/dL (ref 1.7–2.4)

## 2020-06-01 LAB — CORTISOL: Cortisol, Plasma: 15.9 ug/dL

## 2020-06-01 LAB — VITAMIN B12: Vitamin B-12: 414 pg/mL (ref 180–914)

## 2020-06-01 MED ORDER — POTASSIUM CHLORIDE CRYS ER 20 MEQ PO TBCR
40.0000 meq | EXTENDED_RELEASE_TABLET | Freq: Two times a day (BID) | ORAL | Status: AC
  Administered 2020-06-01 – 2020-06-02 (×3): 40 meq via ORAL
  Filled 2020-06-01 (×4): qty 2

## 2020-06-01 NOTE — Progress Notes (Signed)
[name]  IRJ:[id number] DOB: Jan 23, 1919 DOA: 05/29/2020 PCP: [name], MD    Brief Narrative:  86 year old with a history of HTN, HLD, DM2, glaucoma, hypothyroidism, anxiety disorder, and breast cancer who presented to the ED with altered mental status.  She was admitted to the hospital 05/26/2020 with an episode of syncope at which time there was concern she may have suffered a seizure and she was placed on Keppra.  She returned to the hospital 7/12 with another episode of altered mental status after becoming unresponsive in the waiting area of her PCPs office.  Significant Events: 7/13 TTE -EF 65-70% -grade 1 DD 7/14 carotid dopplers -   Antimicrobials:  None  Subjective: More alert at the time of my exam today. Confused but pleasant and able to answer some directed questions.   Assessment & Plan:  Recurrent syncopal episodes -possible seizure activity -altered mental status CT head unrevealing -MRI brain noting only chronic small vessel ischemic change -UA unremarkable -EEG did not reveal epileptic discharges at time of testing -TSH normal -ammonia normal -Keppra increased per neurology -discontinuing/decreasing all sedating medications -TTE unremarkable -unable to tolerate MRA - ?related to neurontin; have now discontinued it fully - T01 and folic acid are not low - a.m. cortisol level within normal range - continue to allow time to clear neurontin   Nonbloody diarrhea POA C. difficile testing negative - diarrhea appears to be resolving   Incidental meningioma Noted on MRI brain and exhibiting no significant mass-effect  Acute kidney injury on CKD stage IIIb Baseline creatinine 1.4 with creatinine 2.2 at admission -likely due to poor intake and increased GI losses in setting of diarrhea -stable near baseline with gentle hydration  Hypokalemia Due to poor intake and GI losses -continue to supplement to goal of 4.0  Hypomagnesemia Due to poor intake and GI losses  -corrected with supplementation  DM2 uncontrolled with peripheral neuropathy Discontinue Neurontin altogether due to concern it could be related to above -CBG well controlled  Hypothyroidism Continue Synthroid -TSH in normal range  Essential hypertension Blood pressure variable but not severely elevated and I wish to avoid overaggressive correction in this individual of advanced age with recurring syncopal spells   DVT prophylaxis: Lovenox Code Status: No code Family Communication: Spoke with daughter at bedside Status is: Inpatient  Remains inpatient appropriate because:Inpatient level of care appropriate due to severity of illness   Dispo: The patient is from: Home              Anticipated d/c is to: Unclear              Anticipated d/c date is: 2 days              Patient currently is not medically stable to d/c.   Consultants:  Neurology  Objective: Blood pressure (!) 157/65, pulse 68, temperature 97.8 F (36.6 C), temperature source Oral, resp. rate 16, weight 74.8 kg, SpO2 95 %.  Intake/Output Summary (Last 24 hours) at 06/01/2020 0934 Last data filed at 06/01/2020 0600 Gross per 24 hour  Intake 160 ml  Output 475 ml  Net -315 ml   Filed Weights   05/29/20 1415  Weight: 74.8 kg    Examination: General: No acute respiratory distress Lungs: CTA B - no wheezing  Cardiovascular: RRR - no M Abdomen: NT/ND, soft, bs+, no mass  Extremities: trace B LE edema   CBC: Recent Labs  Lab 05/29/20 1400 05/30/20 0820 05/31/20 0545  WBC 9.5 7.5  9.3  NEUTROABS 7.0  --   --   HGB 11.6* 12.5 13.0  HCT 34.2* 36.2 37.6  MCV 96.6 95.0 94.9  PLT 196 219 893   Basic Metabolic Panel: Recent Labs  Lab 05/30/20 0820 05/31/20 0545 06/01/20 0308  NA 141 139 139  K 3.1* 3.0* 3.3*  CL 105 104 105  CO2 26 24 23   GLUCOSE 178* 165* 169*  BUN 23 15 17   CREATININE 1.68* 1.39* 1.48*  CALCIUM 8.9 8.8* 8.7*  MG  --  1.6* 2.0   GFR: Estimated Creatinine Clearance: 22.4  mL/min (A) (by C-G formula based on SCr of 1.48 mg/dL (H)).  Liver Function Tests: Recent Labs  Lab 05/29/20 1400 05/30/20 0820 05/31/20 0545  AST 13* 13* 18  ALT 11 11 13   ALKPHOS 56 57 60  BILITOT 0.6 0.8 0.6  PROT 5.9* 6.1* 6.3*  ALBUMIN 2.9* 2.8* 3.0*   No results for input(s): LIPASE, AMYLASE in the last 168 hours. Recent Labs  Lab 05/29/20 1400  AMMONIA 22    HbA1C: Hgb A1c MFr Bld  Date/Time Value Ref Range Status  01/31/2020 04:27 PM 6.3 4.6 - 6.5 % Final    Comment:    Glycemic Control Guidelines for People with Diabetes:Non Diabetic:  <6%Goal of Therapy: <7%Additional Action Suggested:  >8%   06/08/2019 10:30 AM 6.8 (H) 4.6 - 6.5 % Final    Comment:    Glycemic Control Guidelines for People with Diabetes:Non Diabetic:  <6%Goal of Therapy: <7%Additional Action Suggested:  >8%     CBG: Recent Labs  Lab 05/31/20 0630 05/31/20 1151 05/31/20 1506 05/31/20 2122 06/01/20 0659  GLUCAP 154* 136* 136* 108* 143*    Recent Results (from the past 240 hour(s))  SARS Coronavirus 2 by RT PCR (hospital order, performed in [facility] hospital lab) Nasopharyngeal Nasopharyngeal Swab     Status: None   Collection Time: 05/29/20  2:06 PM   Specimen: Nasopharyngeal Swab  Result Value Ref Range Status   SARS Coronavirus 2 NEGATIVE NEGATIVE Final    Comment: (NOTE) SARS-CoV-2 target nucleic acids are NOT DETECTED.  The SARS-CoV-2 RNA is generally detectable in upper and lower respiratory specimens during the acute phase of infection. The lowest concentration of SARS-CoV-2 viral copies this assay can detect is 250 copies / mL. A negative result does not preclude SARS-CoV-2 infection and should not be used as the sole basis for treatment or other patient management decisions.  A negative result may occur with improper specimen collection / handling, submission of specimen other than nasopharyngeal swab, presence of viral mutation(s) within the areas targeted by this  assay, and inadequate number of viral copies (<250 copies / mL). A negative result must be combined with clinical observations, patient history, and epidemiological information.  Fact Sheet for Patients:   StrictlyIdeas.no  Fact Sheet for Healthcare Providers: BankingDealers.co.za  This test is not yet approved or  cleared by the [place] FDA and has been authorized for detection and/or diagnosis of SARS-CoV-2 by FDA under an Emergency Use Authorization (EUA).  This EUA will remain in effect (meaning this test can be used) for the duration of the COVID-19 declaration under Section 564(b)(1) of the Act, 21 U.S.C. section 360bbb-3(b)(1), unless the authorization is terminated or revoked sooner.  Performed at [facility], [place] [address], [place], [place] [address]   Urine culture     Status: None   Collection Time: 05/29/20  6:10 PM   Specimen: Urine, Random  Result  Value Ref Range Status   Specimen Description URINE, RANDOM  Final   Special Requests NONE  Final   Culture   Final    NO GROWTH Performed at [facility], [address], [place], [place] [address]    Report Status 05/30/2020 FINAL  Final  C Difficile Quick Screen w PCR reflex     Status: None   Collection Time: 05/30/20  1:22 PM   Specimen: STOOL  Result Value Ref Range Status   C Diff antigen NEGATIVE NEGATIVE Final   C Diff toxin NEGATIVE NEGATIVE Final   C Diff interpretation No C. difficile detected.  Final    Comment: Performed at [facility], [place] [address], [place], [place] [address]     Scheduled Meds: . acebutolol  200 mg Oral QHS  . amLODipine  5 mg Oral BH-q7a  . atorvastatin  10 mg Oral Daily  . docusate sodium  100 mg Oral BID  . enoxaparin (LOVENOX) injection  30 mg Subcutaneous Q24H  . insulin aspart  0-15 Units Subcutaneous TID WC  . insulin aspart  0-5 Units Subcutaneous QHS  . levothyroxine  100 mcg Oral Q0600    Continuous Infusions: . levETIRAcetam 500 mg (06/01/20 0922)     LOS: 2 days   Cherene Altes, MD [facility] Office  [phone] Pager - Text Page per Shea Evans  If 7PM-7AM, please contact night-coverage per Amion 06/01/2020, 9:34 AM

## 2020-06-01 NOTE — Evaluation (Signed)
Physical Therapy Evaluation Patient Details Name: [name] MRN: [id number] DOB: May 11, 1919 Today's Date: 06/01/2020   History of Present Illness  86 y.o. female with medical history significant of HTN; HLD; DM; glaucoma; anxiety; hypothyroidism; and R breast cancer presenting with AMS.    Clinical Impression  Pt admitted with above diagnosis. PTA pt required 24-hour assist at home, min assist mobility using rollator. On eval, she required min/mod assist bed mobility and +2 min assist transfers. Session limited by multiple episodes of loose stool.  Pt currently with functional limitations due to the deficits listed below (see PT Problem List). Pt will benefit from skilled PT to increase their independence and safety with mobility to allow discharge to the venue listed below.  Daughter present in room and confirms she is unable to provide level of care needed at home.     Follow Up Recommendations SNF    Equipment Recommendations  None recommended by PT    Recommendations for Other Services       Precautions / Restrictions Precautions Precautions: Fall;Other (comment) Precaution Comments: loose stools/bowel incontinence Restrictions Weight Bearing Restrictions: No      Mobility  Bed Mobility Overal bed mobility: Needs Assistance Bed Mobility: Rolling;Supine to Sit;Sit to Supine Rolling: Min assist   Supine to sit: HOB elevated;+2 for safety/equipment;Mod assist Sit to supine: Min assist;+2 for safety/equipment   General bed mobility comments: +rail, cues for sequencing and safety  Transfers Overall transfer level: Needs assistance Equipment used: 2 person hand held assist Transfers: Sit to/from Stand Sit to Stand: Min assist;+2 physical assistance         General transfer comment: Sit to stand bedside for pericare. Pt fatigued quickly, only tolerating static stand < 30 seconds.  Ambulation/Gait             General Gait Details: unable to assess due to  multiple episodes of diarrhea  Stairs            Wheelchair Mobility    Modified Rankin (Stroke Patients Only)       Balance Overall balance assessment: Needs assistance Sitting-balance support: Feet supported;Bilateral upper extremity supported Sitting balance-Leahy Scale: Fair     Standing balance support: Bilateral upper extremity supported Standing balance-Leahy Scale: Poor Standing balance comment: reliant on external support                             Pertinent Vitals/Pain Pain Assessment: Faces Faces Pain Scale: Hurts little more Pain Location: buttocks during pericare (due to multiple loose BMs) Pain Descriptors / Indicators: Tender;Sore Pain Intervention(s): Monitored during session    Home Living Family/patient expects to be discharged to:: Private residence Living Arrangements: Alone Available Help at Discharge: Family;Personal care attendant;Available 24 hours/day Type of Home: House Home Access: Ramped entrance     Home Layout: One level Home Equipment: Walker - 2 wheels;Cane - single point;Walker - 4 wheels      Prior Function Level of Independence: Needs assistance   Gait / Transfers Assistance Needed: ambulates short household distance with rollator and min assist. Daughter reports multi falls at home.  ADL's / Homemaking Assistance Needed: assist for all ADLs. PCA 4 nights/week. Daughter stays with pt all other times.        Hand Dominance        Extremity/Trunk Assessment   Upper Extremity Assessment Upper Extremity Assessment: Defer to OT evaluation    Lower Extremity Assessment Lower Extremity Assessment: Generalized weakness  Cervical / Trunk Assessment Cervical / Trunk Assessment: Kyphotic  Communication   Communication: HOH  Cognition Arousal/Alertness: Awake/alert Behavior During Therapy: WFL for tasks assessed/performed Overall Cognitive Status: Impaired/Different from baseline Area of Impairment:  Orientation;Attention;Memory;Following commands;Safety/judgement;Awareness;Problem solving                 Orientation Level: Disoriented to;Time Current Attention Level: Sustained Memory: Decreased short-term memory Following Commands: Follows one step commands consistently;Follows one step commands with increased time Safety/Judgement: Decreased awareness of safety Awareness: Intellectual Problem Solving: Difficulty sequencing;Requires verbal cues;Slow processing General Comments: Pt with eyes closed during majority of session. Pt states "I'm not tired. The light hurts my eyes."      General Comments      Exercises     Assessment/Plan    PT Assessment Patient needs continued PT services  PT Problem List Decreased strength;Decreased mobility;Decreased safety awareness;Decreased knowledge of precautions;Decreased activity tolerance;Decreased cognition;Decreased balance       PT Treatment Interventions DME instruction;Therapeutic activities;Gait training;Therapeutic exercise;Patient/family education;Balance training;Functional mobility training;Cognitive remediation    PT Goals (Current goals can be found in the Care Plan section)  Acute Rehab PT Goals Patient Stated Goal: not stated PT Goal Formulation: With patient/family Time For Goal Achievement: 06/15/20 Potential to Achieve Goals: Fair    Frequency Min 2X/week   Barriers to discharge        Co-evaluation PT/OT/SLP Co-Evaluation/Treatment: Yes Reason for Co-Treatment: Complexity of the patient's impairments (multi-system involvement);To address functional/ADL transfers PT goals addressed during session: Mobility/safety with mobility;Balance         AM-PAC PT "6 Clicks" Mobility  Outcome Measure Help needed turning from your back to your side while in a flat bed without using bedrails?: A Little Help needed moving from lying on your back to sitting on the side of a flat bed without using bedrails?: A  Little Help needed moving to and from a bed to a chair (including a wheelchair)?: A Lot Help needed standing up from a chair using your arms (e.g., wheelchair or bedside chair)?: A Little Help needed to walk in hospital room?: A Lot Help needed climbing 3-5 steps with a railing? : Total 6 Click Score: 14    End of Session Equipment Utilized During Treatment: Gait belt Activity Tolerance: Other (comment) (limited by diarrhea) Patient left: in bed;with call bell/phone within reach;with bed alarm set;with family/visitor present Nurse Communication: Mobility status PT Visit Diagnosis: Muscle weakness (generalized) (M62.81);Difficulty in walking, not elsewhere classified (R26.2);History of falling (Z91.81)    Time: 1610-9604 PT Time Calculation (min) (ACUTE ONLY): 24 min   Charges:   PT Evaluation $PT Eval Moderate Complexity: 1 Mod          Lorrin Goodell, PT  Office # [phone] Pager [phone]   Lorriane Shire 06/01/2020, 11:54 AM

## 2020-06-01 NOTE — Evaluation (Signed)
Occupational Therapy Evaluation Patient Details Name: [name] MRN: [id number] DOB: 09-27-1919 Today's Date: 06/01/2020    History of Present Illness 86 y.o. female with medical history significant of HTN; HLD; DM; glaucoma; anxiety; hypothyroidism; and R breast cancer presenting with AMS.   Clinical Impression   PTA pt's daughter reports that her mom was living at home alone with 24/7 supervision receiving help from aide and daughter with ADLs and IADLs and using RW/rollator for mobility. Pt was admitted for above and treated for problem list below (see OT Problem List). Pt limited historian - daughter present during eval to confirm PLOF and home set up. Limited eval due to bowel incontinence. Requires Min A - Mod A with multimodal cues for sequencing during ADLs due to decreased cognition and generalized weakness. Requires Min A +2 with transfers and Min A with bed mobility with multimodal cues for sequencing. Pt's daughter confirms 24/7 assist at home from herself and aide, but reports with pt current pt status, she does not feel it safe for her to take care of her mom. Believe pt would benefit from skilled OT services acutely and at the SNF level to return to PLOF.    Follow Up Recommendations  SNF;Supervision/Assistance - 24 hour    Equipment Recommendations  Other (comment) (TBD at next venue of care)       Precautions / Restrictions Precautions Precautions: Fall;Other (comment) Precaution Comments: loose stools/bowel incontinence      Mobility Bed Mobility Overal bed mobility: Needs Assistance Bed Mobility: Rolling;Supine to Sit;Sit to Supine Rolling: Min assist   Supine to sit: HOB elevated;+2 for safety/equipment;Mod assist Sit to supine: Min assist;+2 for safety/equipment   General bed mobility comments: Pt using bed rails with mod verbal cues for sequencing  Transfers Overall transfer level: Needs assistance Equipment used: 2 person hand held assist Transfers: Sit  to/from Stand Sit to Stand: Min assist;+2 physical assistance         General transfer comment: Pt tolerated quick sit<>stand at bedside for pericare    Balance Overall balance assessment: Needs assistance Sitting-balance support: Feet supported;Bilateral upper extremity supported Sitting balance-Leahy Scale: Fair     Standing balance support: Bilateral upper extremity supported Standing balance-Leahy Scale: Poor Standing balance comment: reliant on external support                           ADL either performed or assessed with clinical judgement   ADL Overall ADL's : Needs assistance/impaired     Grooming: Set up;Sitting Grooming Details (indicate cue type and reason): Set up with sitting Upper Body Bathing: Minimal assistance;Sitting;Cueing for sequencing Upper Body Bathing Details (indicate cue type and reason): Min A with cues for sequencing Lower Body Bathing: Moderate assistance;Sit to/from stand;Sitting/lateral leans Lower Body Bathing Details (indicate cue type and reason): Mod A in sitting with cues for sequencing Upper Body Dressing : Minimal assistance;Sitting;Cueing for sequencing Upper Body Dressing Details (indicate cue type and reason): Min A with cues for sequencing Lower Body Dressing: Cueing for sequencing;Sitting/lateral leans;Sit to/from stand;Maximal assistance;+2 for physical assistance Lower Body Dressing Details (indicate cue type and reason): Max A +2 for sit to stand and clothing aid with cues for sequencing   Toilet Transfer Details (indicate cue type and reason): deferred due to bowel incontinence and fatigue Toileting- Clothing Manipulation and Hygiene: Total assistance;+2 for safety/equipment;Sit to/from stand Toileting - Clothing Manipulation Details (indicate cue type and reason): Need for BUE assist   Tub/Shower  Transfer Details (indicate cue type and reason): deferred due to pt safety Functional mobility during ADLs:  (deferred due  to pt incontinence) General ADL Comments: Limited due to pt with bowel incontinence                  Pertinent Vitals/Pain Pain Assessment: Faces Faces Pain Scale: Hurts little more Pain Location: buttocks during pericare (due to multiple loose BMs) Pain Descriptors / Indicators: Tender;Sore Pain Intervention(s): Repositioned;Monitored during session        Extremity/Trunk Assessment Upper Extremity Assessment Upper Extremity Assessment: Generalized weakness   Lower Extremity Assessment Lower Extremity Assessment: Defer to PT evaluation   Cervical / Trunk Assessment Cervical / Trunk Assessment: Kyphotic   Communication Communication Communication: HOH   Cognition Arousal/Alertness: Awake/alert Behavior During Therapy: WFL for tasks assessed/performed Overall Cognitive Status: Impaired/Different from baseline Area of Impairment: Orientation;Attention;Memory;Following commands;Safety/judgement;Awareness;Problem solving                 Orientation Level: Disoriented to;Time Current Attention Level: Sustained Memory: Decreased short-term memory Following Commands: Follows one step commands consistently;Follows one step commands with increased time Safety/Judgement: Decreased awareness of safety;Decreased awareness of deficits Awareness: Intellectual Problem Solving: Difficulty sequencing;Requires verbal cues;Slow processing General Comments: Pt with eyes closed but denying fatigue   General Comments  Pt's daughter present to confirm PLOF and home set up - pt limited historian            Home Living Family/patient expects to be discharged to:: Private residence Living Arrangements: Alone Available Help at Discharge: Family;Personal care attendant;Available 24 hours/day Type of Home: House Home Access: Ramped entrance     Home Layout: One level         Bathroom Toilet: Standard     Home Equipment: Walker - 2 wheels;Cane - single point;Walker - 4  wheels   Additional Comments: daughter states  she lives across the street, assists her mom with meds, whatever is needed. she is ususally with her mother ~ half day as that is about all her mother will allow.      Prior Functioning/Environment Level of Independence: Needs assistance  Gait / Transfers Assistance Needed: ambulates short household distance with rollator and min assist. Daughter reports multi falls at home. ADL's / Homemaking Assistance Needed: assist for all ADLs. PCA 4 nights/week. Daughter stays with pt all other times.            OT Problem List: Decreased strength;Decreased activity tolerance;Impaired balance (sitting and/or standing);Decreased cognition;Decreased safety awareness;Decreased knowledge of use of DME or AE;Decreased knowledge of precautions;Pain      OT Treatment/Interventions: Self-care/ADL training;Therapeutic exercise;Energy conservation;DME and/or AE instruction;Therapeutic activities;Cognitive remediation/compensation;Patient/family education;Balance training    OT Goals(Current goals can be found in the care plan section) Acute Rehab OT Goals Patient Stated Goal: not stated Time For Goal Achievement: 06/15/20 Potential to Achieve Goals: Good  OT Frequency: Min 2X/week   Barriers to D/C: Decreased caregiver support  Pt daughter reports she would not be able to take care of her mom in her current condition       Co-evaluation PT/OT/SLP Co-Evaluation/Treatment: Yes Reason for Co-Treatment: Complexity of the patient's impairments (multi-system involvement);To address functional/ADL transfers PT goals addressed during session: Mobility/safety with mobility;Balance OT goals addressed during session: ADL's and self-care;Strengthening/ROM;Proper use of Adaptive equipment and DME      AM-PAC OT "6 Clicks" Daily Activity     Outcome Measure Help from another person eating meals?: None Help from another person taking care of personal grooming?:  A  Little Help from another person toileting, which includes using toliet, bedpan, or urinal?: A Lot Help from another person bathing (including washing, rinsing, drying)?: A Lot Help from another person to put on and taking off regular upper body clothing?: A Little Help from another person to put on and taking off regular lower body clothing?: A Lot 6 Click Score: 16   End of Session Equipment Utilized During Treatment: Gait belt;Rolling walker Nurse Communication: Mobility status  Activity Tolerance: Patient tolerated treatment well;Other (comment);Treatment limited secondary to medical complications (Comment) (bowel incontinence) Patient left: in bed;with call bell/phone within reach;with bed alarm set;with family/visitor present  OT Visit Diagnosis: Unsteadiness on feet (R26.81);Muscle weakness (generalized) (M62.81);History of falling (Z91.81);Other symptoms and signs involving cognitive function;Pain Pain - part of body:  (buttocks)                Time: 6222-9798 OT Time Calculation (min): 18 min Charges:  OT General Charges $OT Visit: 1 Visit OT Evaluation $OT Eval Moderate Complexity: 1 Mod  Jashun Puertas/OTS  Arjay Jaskiewicz 06/01/2020, 1:28 PM

## 2020-06-01 NOTE — NC FL2 (Signed)
[place] LEVEL OF CARE SCREENING TOOL     IDENTIFICATION  Patient Name: [name] Birthdate: 06-24-1919 Sex: female Admission Date (Current Location): 05/29/2020  [facility] and [place] Number:  Herbalist and Address:  [facility] [place] [place] [address] [place], [place] [address]      Provider Number: [id number]  Attending Physician Name and Address:  Cherene Altes, MD  Relative Name and Phone Number:       Current Level of Care: Hospital Recommended Level of Care: [place] Prior Approval Number:    Date Approved/Denied:   PASRR Number: [id number]  Discharge Plan: SNF    Current Diagnoses: Patient Active Problem List   Diagnosis Date Noted   Postural dizziness with presyncope 05/30/2020   Unresponsive 05/29/2020   Altered mental status 05/29/2020   DNR (do not resuscitate) discussion 05/29/2020   Dementia with behavioral disturbance ([place]) 12/01/2019   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   Open wound of arm with complication, right, initial encounter 08/13/2019   Need for influenza vaccination 08/13/2019   Hypochloremia 07/13/2019   Syncope 06/24/2019   Hyponatremia    Pain due to onychomycosis of toenails of both feet 05/18/2019   Diabetic neuropathy ([place]) 05/18/2019   Macrocytic anemia 12/08/2018   Dehydration 08/06/2018   Hospital discharge follow-up 08/06/2018   Abnormal liver function    Acute lower UTI 07/31/2018   Sepsis ([place]) 07/31/2018   Hypokalemia 07/31/2018   AKI (acute kidney injury) ([place]) 07/31/2018   CKD (chronic kidney disease) stage 3, GFR 30-59 ml/min 07/09/2018   Urinary frequency 04/14/2018   Hyperlipidemia 04/08/2017   Right knee pain 12/10/2016   Osteoarthritis 04/23/2016   Peripheral neuropathy 04/23/2016   Joint pain, hip 06/22/2015   Skin lesion of face 06/10/2013   General medical examination 10/30/2011   Cancer of overlapping sites of right  female breast ([place]) 10/09/2011   Hypothyroidism 12/13/2010   Diabetes mellitus type 2, noninsulin dependent ([place]) 12/13/2010   Gout 12/13/2010   ANXIETY 12/13/2010   Essential hypertension 12/13/2010    Orientation RESPIRATION BLADDER Height & Weight     Self, Place  Normal Incontinent Weight: 164 lb 14.5 oz (74.8 kg) Height:     BEHAVIORAL SYMPTOMS/MOOD NEUROLOGICAL BOWEL NUTRITION STATUS      Incontinent Diet  AMBULATORY STATUS COMMUNICATION OF NEEDS Skin   Extensive Assist Verbally Skin abrasions                       Personal Care Assistance Level of Assistance  Bathing, Feeding, Dressing Bathing Assistance: Limited assistance Feeding assistance: Limited assistance Dressing Assistance: Limited assistance     Functional Limitations Info             [place]  PT (By licensed PT), OT (By licensed OT)     PT Frequency: 5x/wk OT Frequency: 5x/wk            Contractures Contractures Info: Not present    Additional Factors Info  Code Status, Allergies, Insulin Sliding Scale Code Status Info: DNR Allergies Info: Ativan   Insulin Sliding Scale Info: 0-15 units 3x/day with meals; 0-5 units daily at bed       Current Medications (06/01/2020):  This is the current hospital active medication list Current Facility-Administered Medications  Medication Dose Route Frequency Provider Last Rate Last Admin   acebutolol (SECTRAL) capsule 200 mg  200 mg Oral QHS Karmen Bongo, MD   200 mg  at 05/31/20 2244   acetaminophen (TYLENOL) tablet 650 mg  650 mg Oral Q6H PRN Amin, Ankit Chirag, MD       amLODipine (NORVASC) tablet 5 mg  5 mg Oral Ledell Noss, MD   5 mg at 06/01/20 0600   atorvastatin (LIPITOR) tablet 10 mg  10 mg Oral Daily Karmen Bongo, MD   10 mg at 06/01/20 0981   docusate sodium (COLACE) capsule 100 mg  100 mg Oral BID Karmen Bongo, MD   100 mg at 05/31/20 2244   enoxaparin (LOVENOX) injection 30 mg  30 mg  Subcutaneous Q24H Karmen Bongo, MD   30 mg at 05/31/20 1703   insulin aspart (novoLOG) injection 0-15 Units  0-15 Units Subcutaneous TID WC Karmen Bongo, MD   5 Units at 06/01/20 1236   insulin aspart (novoLOG) injection 0-5 Units  0-5 Units Subcutaneous QHS Karmen Bongo, MD       levETIRAcetam (KEPPRA) IVPB 500 mg/100 mL premix  500 mg Intravenous Lillia Mountain, MD 400 mL/hr at 06/01/20 0922 500 mg at 06/01/20 1914   levothyroxine (SYNTHROID) tablet 100 mcg  100 mcg Oral Q0600 Karmen Bongo, MD   100 mcg at 06/01/20 0559   loperamide (IMODIUM) capsule 4 mg  4 mg Oral PRN Damita Lack, MD   4 mg at 06/01/20 1044   ondansetron (ZOFRAN) tablet 4 mg  4 mg Oral Q6H PRN Karmen Bongo, MD       Or   ondansetron [facility]) injection 4 mg  4 mg Intravenous Q6H PRN Karmen Bongo, MD       polyethylene glycol (MIRALAX / GLYCOLAX) packet 17 g  17 g Oral Daily PRN Amin, Ankit Chirag, MD       polyvinyl alcohol (LIQUIFILM TEARS) 1.4 % ophthalmic solution 1 drop  1 drop Both Eyes TID PRN Karmen Bongo, MD       senna-docusate (Senokot-S) tablet 2 tablet  2 tablet Oral QHS PRN Damita Lack, MD         Discharge Medications: Please see discharge summary for a list of discharge medications.  Relevant Imaging Results:  Relevant Lab Results:   Additional Information SS#: [id number]  Geralynn Ochs, LCSW

## 2020-06-02 LAB — GLUCOSE, CAPILLARY
Glucose-Capillary: 206 mg/dL — ABNORMAL HIGH (ref 70–99)
Glucose-Capillary: 186 mg/dL — ABNORMAL HIGH (ref 70–99)
Glucose-Capillary: 141 mg/dL — ABNORMAL HIGH (ref 70–99)
Glucose-Capillary: 138 mg/dL — ABNORMAL HIGH (ref 70–99)

## 2020-06-02 MED ORDER — LEVETIRACETAM 500 MG PO TABS
500.0000 mg | ORAL_TABLET | Freq: Two times a day (BID) | ORAL | Status: DC
  Administered 2020-06-03 – 2020-06-08 (×11): 500 mg via ORAL
  Filled 2020-06-02 (×11): qty 1

## 2020-06-02 NOTE — Progress Notes (Signed)
[name]  UUV:[id number] DOB: 21-Dec-1918 DOA: 05/29/2020 PCP: [name], MD    Brief Narrative:  86 year old with a history of HTN, HLD, DM2, glaucoma, hypothyroidism, anxiety disorder, and breast cancer who presented to the ED with altered mental status.  She was admitted to the hospital 05/26/2020 with an episode of syncope at which time there was concern she may have suffered a seizure and she was placed on Keppra.  She returned to the hospital 7/12 with another episode of altered mental status after becoming unresponsive in the waiting area of her PCPs office.  Significant Events: 7/13 TTE -EF 65-70% -grade 1 DD 7/14 carotid dopplers -   Antimicrobials:  None  Subjective: Mental status appears to be waxing and waning but in my estimation is slowly improving overall.   Assessment & Plan:  Recurrent syncopal episodes -possible seizure activity -altered mental status CT head unrevealing - MRI brain noting only chronic small vessel ischemic change - UA unremarkable -EEG did not reveal epileptic discharges at time of testing -TSH normal -ammonia normal -Keppra increased per neurology -discontinuing/decreasing all other sedating medications -TTE unremarkable -unable to tolerate MRA - ?related to neurontin; have now discontinued it fully - K74 and folic acid are not low - a.m. cortisol level within normal range - continue to allow time to clear Neurontin - medically ready for placement in SNF   Nonbloody diarrhea POA C. difficile testing negative - diarrhea resolved   Incidental meningioma Noted on MRI brain and exhibiting no significant mass-effect  Acute kidney injury on CKD stage IIIb Baseline creatinine 1.4 with creatinine 2.2 at admission - likely due to poor intake and increased GI losses in setting of diarrhea - stable near baseline with gentle hydration  Hypokalemia Due to poor intake and GI losses -continue to supplement to goal of 4.0  Hypomagnesemia Due to  poor intake and GI losses -corrected with supplementation  DM2 uncontrolled with peripheral neuropathy Discontinued Neurontin altogether due to concern it could be related to above -CBG well controlled  Hypothyroidism Continue Synthroid -TSH in normal range  Essential hypertension Blood pressure variable but not severely elevated and I wish to avoid overaggressive correction in this individual of advanced age with recurring syncopal spells   DVT prophylaxis: Lovenox Code Status: No code Family Communication: Spoke with daughter at bedside Status is: Inpatient  Remains inpatient appropriate because:Unsafe d/c plan and Inpatient level of care appropriate due to severity of illness   Dispo: The patient is from: Home              Anticipated d/c is to: SNF              Anticipated d/c date is: 1 day              Patient currently is medically stable to d/c.   Consultants:  Neurology  Objective: Blood pressure (!) 131/56, pulse 61, temperature 100.2 F (37.9 C), temperature source Oral, resp. rate 16, weight 74.8 kg, SpO2 100 %.  Intake/Output Summary (Last 24 hours) at 06/02/2020 0932 Last data filed at 06/02/2020 0600 Gross per 24 hour  Intake 270 ml  Output 300 ml  Net -30 ml   Filed Weights   05/29/20 1415  Weight: 74.8 kg    Examination: General: No acute respiratory distress Lungs: CTA B  Cardiovascular: RRR - no M Abdomen: NT/ND, soft, bs+, no mass  Extremities: No significant edema B LE  CBC: Recent Labs  Lab 05/29/20 1400 05/30/20 0820  05/31/20 0545  WBC 9.5 7.5 9.3  NEUTROABS 7.0  --   --   HGB 11.6* 12.5 13.0  HCT 34.2* 36.2 37.6  MCV 96.6 95.0 94.9  PLT 196 219 466   Basic Metabolic Panel: Recent Labs  Lab 05/30/20 0820 05/31/20 0545 06/01/20 0308  NA 141 139 139  K 3.1* 3.0* 3.3*  CL 105 104 105  CO2 26 24 23   GLUCOSE 178* 165* 169*  BUN 23 15 17   CREATININE 1.68* 1.39* 1.48*  CALCIUM 8.9 8.8* 8.7*  MG  --  1.6* 2.0    GFR: Estimated Creatinine Clearance: 22.4 mL/min (A) (by C-G formula based on SCr of 1.48 mg/dL (H)).  Liver Function Tests: Recent Labs  Lab 05/29/20 1400 05/30/20 0820 05/31/20 0545  AST 13* 13* 18  ALT 11 11 13   ALKPHOS 56 57 60  BILITOT 0.6 0.8 0.6  PROT 5.9* 6.1* 6.3*  ALBUMIN 2.9* 2.8* 3.0*    Recent Labs  Lab 05/29/20 1400  AMMONIA 22    HbA1C: Hgb A1c MFr Bld  Date/Time Value Ref Range Status  01/31/2020 04:27 PM 6.3 4.6 - 6.5 % Final    Comment:    Glycemic Control Guidelines for People with Diabetes:Non Diabetic:  <6%Goal of Therapy: <7%Additional Action Suggested:  >8%   06/08/2019 10:30 AM 6.8 (H) 4.6 - 6.5 % Final    Comment:    Glycemic Control Guidelines for People with Diabetes:Non Diabetic:  <6%Goal of Therapy: <7%Additional Action Suggested:  >8%     CBG: Recent Labs  Lab 06/01/20 0659 06/01/20 1221 06/01/20 1629 06/01/20 2124 06/02/20 0637  GLUCAP 143* 248* 182* 157* 206*    Recent Results (from the past 240 hour(s))  SARS Coronavirus 2 by RT PCR (hospital order, performed in [facility] hospital lab) Nasopharyngeal Nasopharyngeal Swab     Status: None   Collection Time: 05/29/20  2:06 PM   Specimen: Nasopharyngeal Swab  Result Value Ref Range Status   SARS Coronavirus 2 NEGATIVE NEGATIVE Final    Comment: (NOTE) SARS-CoV-2 target nucleic acids are NOT DETECTED.  The SARS-CoV-2 RNA is generally detectable in upper and lower respiratory specimens during the acute phase of infection. The lowest concentration of SARS-CoV-2 viral copies this assay can detect is 250 copies / mL. A negative result does not preclude SARS-CoV-2 infection and should not be used as the sole basis for treatment or other patient management decisions.  A negative result may occur with improper specimen collection / handling, submission of specimen other than nasopharyngeal swab, presence of viral mutation(s) within the areas targeted by this assay, and  inadequate number of viral copies (<250 copies / mL). A negative result must be combined with clinical observations, patient history, and epidemiological information.  Fact Sheet for Patients:   StrictlyIdeas.no  Fact Sheet for Healthcare Providers: BankingDealers.co.za  This test is not yet approved or  cleared by the [place] FDA and has been authorized for detection and/or diagnosis of SARS-CoV-2 by FDA under an Emergency Use Authorization (EUA).  This EUA will remain in effect (meaning this test can be used) for the duration of the COVID-19 declaration under Section 564(b)(1) of the Act, 21 U.S.C. section 360bbb-3(b)(1), unless the authorization is terminated or revoked sooner.  Performed at [facility], [place] [address], [place], [place] [address]   Urine culture     Status: None   Collection Time: 05/29/20  6:10 PM   Specimen: Urine, Random  Result Value Ref Range Status  Specimen Description URINE, RANDOM  Final   Special Requests NONE  Final   Culture   Final    NO GROWTH Performed at [facility], [place] [address] [place], [place] [address]    Report Status 05/30/2020 FINAL  Final  C Difficile Quick Screen w PCR reflex     Status: None   Collection Time: 05/30/20  1:22 PM   Specimen: STOOL  Result Value Ref Range Status   C Diff antigen NEGATIVE NEGATIVE Final   C Diff toxin NEGATIVE NEGATIVE Final   C Diff interpretation No C. difficile detected.  Final    Comment: Performed at [facility], [place] [address], [place], [place] [address]     Scheduled Meds: . acebutolol  200 mg Oral QHS  . amLODipine  5 mg Oral BH-[id number]  . atorvastatin  10 mg Oral Daily  . docusate sodium  100 mg Oral BID  . enoxaparin (LOVENOX) injection  30 mg Subcutaneous Q24H  . insulin aspart  0-15 Units Subcutaneous TID WC  . insulin aspart  0-5 Units Subcutaneous QHS  . levothyroxine  100 mcg Oral Q0600  . potassium  chloride  40 mEq Oral BID   Continuous Infusions: . levETIRAcetam 500 mg (06/01/20 2139)     LOS: 3 days   Cherene Altes, MD [facility] Office  [phone] Pager - Text Page per Shea Evans  If 7PM-7AM, please contact night-coverage per Amion 06/02/2020, 9:32 AM

## 2020-06-02 NOTE — Progress Notes (Addendum)
Physical Therapy Treatment Patient Details Name: [name] MRN: [id number] DOB: [birth year] Today's Date: 06/02/2020    History of Present Illness 86 y.o. female with medical history significant of HTN; HLD; DM; glaucoma; anxiety; hypothyroidism; and R breast cancer presenting with AMS.    PT Comments    Pt received in bed, very restless with leg up on bottom side rail. She required mod assist bed mobility and mod/max assist transfers. Pt positioned in recliner with feet elevated. She continued to demonstrate restlessness. Therefore, determined unsafe to leave in recliner and assisted back to bed. Pt less interactive this session as compared to yesterday's eval. More confused, little to no eye contact, and little verbalizations.     Follow Up Recommendations  SNF     Equipment Recommendations  None recommended by PT    Recommendations for Other Services       Precautions / Restrictions Precautions Precautions: Fall;Other (comment) Precaution Comments: loose stools/bowel incontinence    Mobility  Bed Mobility Overal bed mobility: Needs Assistance Bed Mobility: Supine to Sit;Sit to Supine;Rolling Rolling: Min assist   Supine to sit: HOB elevated;Mod assist Sit to supine: Mod assist   General bed mobility comments: cues for sequencing and safety  Transfers Overall transfer level: Needs assistance Equipment used: Rolling walker (2 wheeled);1 person hand held assist Transfers: Sit to/from [facility] Sit to Stand: Mod assist Stand pivot transfers: Max assist       General transfer comment: Pivot transfers bed to recliner with RW. Pivot transfer recliner to bed without RW. Pt maintaining flexed posture.  Ambulation/Gait             General Gait Details: unable to safely progress   Stairs             Wheelchair Mobility    Modified Rankin (Stroke Patients Only)       Balance Overall balance assessment: Needs  assistance Sitting-balance support: Feet supported;No upper extremity supported Sitting balance-Leahy Scale: Fair     Standing balance support: Bilateral upper extremity supported;During functional activity Standing balance-Leahy Scale: Poor Standing balance comment: reliant on external support                            Cognition Arousal/Alertness: Awake/alert Behavior During Therapy: Restless Overall Cognitive Status: Impaired/Different from baseline Area of Impairment: Orientation;Attention;Memory;Following commands;Safety/judgement;Awareness;Problem solving                 Orientation Level: Disoriented to;Time;Place;Situation Current Attention Level: Focused Memory: Decreased short-term memory Following Commands: Follows one step commands inconsistently;Follows one step commands with increased time Safety/Judgement: Decreased awareness of safety;Decreased awareness of deficits Awareness: Intellectual Problem Solving: Difficulty sequencing;Requires verbal cues;Slow processing General Comments: Pt very restless. Little to no eye contact or verbal interactions. Mildly impulsive.      Exercises      General Comments        Pertinent Vitals/Pain Pain Assessment: Faces Faces Pain Scale: Hurts little more Pain Location: BLE Pain Descriptors / Indicators: Grimacing;Discomfort Pain Intervention(s): Monitored during session;Repositioned    Home Living                      Prior Function            PT Goals (current goals can now be found in the care plan section) Acute Rehab PT Goals Patient Stated Goal: not stated Progress towards PT goals: Progressing toward goals    Frequency  Min 2X/week      PT Plan Current plan remains appropriate    Co-evaluation              AM-PAC PT "6 Clicks" Mobility   Outcome Measure  Help needed turning from your back to your side while in a flat bed without using bedrails?: A Little Help  needed moving from lying on your back to sitting on the side of a flat bed without using bedrails?: A Little Help needed moving to and from a bed to a chair (including a wheelchair)?: A Lot Help needed standing up from a chair using your arms (e.g., wheelchair or bedside chair)?: A Lot Help needed to walk in hospital room?: Total Help needed climbing 3-5 steps with a railing? : Total 6 Click Score: 12    End of Session Equipment Utilized During Treatment: Gait belt Activity Tolerance: Patient limited by fatigue Patient left: in bed;with call bell/phone within reach;with bed alarm set;Other (comment) (telesitter)   PT Visit Diagnosis: Muscle weakness (generalized) (M62.81);Difficulty in walking, not elsewhere classified (R26.2);History of falling (Z91.81)     Time: 3716-9678 PT Time Calculation (min) (ACUTE ONLY): 26 min  Charges:  $Therapeutic Activity: 23-37 mins                     Lorrin Goodell, PT  Office # [phone] Pager [phone]    [name] 06/02/2020, 8:31 AM

## 2020-06-03 LAB — GLUCOSE, CAPILLARY
Glucose-Capillary: 167 mg/dL — ABNORMAL HIGH (ref 70–99)
Glucose-Capillary: 148 mg/dL — ABNORMAL HIGH (ref 70–99)
Glucose-Capillary: 142 mg/dL — ABNORMAL HIGH (ref 70–99)
Glucose-Capillary: 157 mg/dL — ABNORMAL HIGH (ref 70–99)

## 2020-06-03 LAB — COMPREHENSIVE METABOLIC PANEL
ALT: 14 U/L (ref 0–44)
AST: 16 U/L (ref 15–41)
Albumin: 2.9 g/dL — ABNORMAL LOW (ref 3.5–5.0)
Alkaline Phosphatase: 57 U/L (ref 38–126)
Anion gap: 13 (ref 5–15)
BUN: 18 mg/dL (ref 8–23)
CO2: 20 mmol/L — ABNORMAL LOW (ref 22–32)
Calcium: 9.3 mg/dL (ref 8.9–10.3)
Chloride: 106 mmol/L (ref 98–111)
Creatinine, Ser: 1.43 mg/dL — ABNORMAL HIGH (ref 0.44–1.00)
GFR calc Af Amer: 35 mL/min — ABNORMAL LOW (ref >60)
GFR calc non Af Amer: 30 mL/min — ABNORMAL LOW (ref >60)
Glucose, Bld: 160 mg/dL — ABNORMAL HIGH (ref 70–99)
Potassium: 3.9 mmol/L (ref 3.5–5.1)
Sodium: 139 mmol/L (ref 135–145)
Total Bilirubin: 0.8 mg/dL (ref 0.3–1.2)
Total Protein: 6.8 g/dL (ref 6.5–8.1)

## 2020-06-03 LAB — MAGNESIUM: Magnesium: 1.7 mg/dL (ref 1.7–2.4)

## 2020-06-03 MED ORDER — AMLODIPINE BESYLATE 10 MG PO TABS
10.0000 mg | ORAL_TABLET | ORAL | Status: DC
  Administered 2020-06-04 – 2020-06-08 (×5): 10 mg via ORAL
  Filled 2020-06-03 (×5): qty 1

## 2020-06-03 NOTE — Progress Notes (Signed)
[name]  ACZ:[id number] DOB: [birth year] DOA: 05/29/2020 PCP: [name], MD    Brief Narrative:  86 year old with a history of HTN, HLD, DM2, glaucoma, hypothyroidism, anxiety disorder, and breast cancer who presented to the ED with altered mental status.  She was admitted to the hospital 05/26/2020 with an episode of syncope at which time there was concern she may have suffered a seizure and she was placed on Keppra.  She returned to the hospital 7/12 with another episode of altered mental status after becoming unresponsive in the waiting area of her PCPs office.  Significant Events: 7/13 TTE -EF 65-70% -grade 1 DD 7/14 carotid dopplers -   Antimicrobials:  None  Subjective: Resting comfortably in bed at time of my visit.  Awakens to my voice.  Answer simple questions but remains confused.  No evidence of respiratory distress or discomfort.  Spoke with patient's daughter at bedside.  Assessment & Plan:  Recurrent syncopal episodes -possible seizure activity -altered mental status CT head unrevealing - MRI brain noting only chronic small vessel ischemic change - UA unremarkable -EEG did not reveal epileptic discharges at time of testing -TSH normal -ammonia normal -Keppra increased per neurology -discontinuing/decreasing all other sedating medications -TTE unremarkable -unable to tolerate MRA - ?related to neurontin; have now discontinued it fully - F09 and folic acid are not low - a.m. cortisol level within normal range - continue to allow time to clear Neurontin - medically ready for placement in SNF when bed available  Nonbloody diarrhea POA C. difficile testing negative - diarrhea resolved   Incidental meningioma Noted on MRI brain and exhibiting no significant mass effect  Acute kidney injury on CKD stage IIIb Baseline creatinine 1.4 with creatinine 2.2 at admission - likely due to poor intake and increased GI losses in setting of diarrhea - stable near baseline with  gentle hydration  Hypokalemia Due to poor intake and GI losses - continue to supplement to goal of 4.0  Hypomagnesemia Due to poor intake and GI losses -corrected with supplementation  DM2 uncontrolled with peripheral neuropathy Discontinued Neurontin altogether due to concern it could be related to above -CBG well controlled  Hypothyroidism Continue Synthroid -TSH in normal range  Essential hypertension With consistent elevation of blood pressure will gently adjust medical therapy and follow   DVT prophylaxis: Lovenox Code Status: No code Family Communication: Spoke with daughter at bedside Status is: Inpatient  Remains inpatient appropriate because:Unsafe d/c plan and Inpatient level of care appropriate due to severity of illness   Dispo: The patient is from: Home              Anticipated d/c is to: SNF              Anticipated d/c date is: 1 day              Patient currently is medically stable to d/c.   Consultants:  Neurology  Objective: Blood pressure (!) 158/71, pulse 71, temperature 98.4 F (36.9 C), temperature source Oral, resp. rate 18, weight 74.8 kg, SpO2 98 %. No intake or output data in the 24 hours ending 06/03/20 0944 Filed Weights   05/29/20 1415  Weight: 74.8 kg    Examination: General: No acute respiratory distress Lungs: CTA B without wheezing Cardiovascular: RRR  Abdomen: NT/ND, soft, bs+, no mass  Extremities: No edema B LE  CBC: Recent Labs  Lab 05/29/20 1400 05/30/20 0820 05/31/20 0545  WBC 9.5 7.5 9.3  NEUTROABS 7.0  --   --  HGB 11.6* 12.5 13.0  HCT 34.2* 36.2 37.6  MCV 96.6 95.0 94.9  PLT 196 219 093   Basic Metabolic Panel: Recent Labs  Lab 05/30/20 0820 05/31/20 0545 06/01/20 0308  NA 141 139 139  K 3.1* 3.0* 3.3*  CL 105 104 105  CO2 26 24 23   GLUCOSE 178* 165* 169*  BUN 23 15 17   CREATININE 1.68* 1.39* 1.48*  CALCIUM 8.9 8.8* 8.7*  MG  --  1.6* 2.0   GFR: Estimated Creatinine Clearance: 22.4 mL/min (A)  (by C-G formula based on SCr of 1.48 mg/dL (H)).  Liver Function Tests: Recent Labs  Lab 05/29/20 1400 05/30/20 0820 05/31/20 0545  AST 13* 13* 18  ALT 11 11 13   ALKPHOS 56 57 60  BILITOT 0.6 0.8 0.6  PROT 5.9* 6.1* 6.3*  ALBUMIN 2.9* 2.8* 3.0*    Recent Labs  Lab 05/29/20 1400  AMMONIA 22    HbA1C: Hgb A1c MFr Bld  Date/Time Value Ref Range Status  01/31/2020 04:27 PM 6.3 4.6 - 6.5 % Final    Comment:    Glycemic Control Guidelines for People with Diabetes:Non Diabetic:  <6%Goal of Therapy: <7%Additional Action Suggested:  >8%   06/08/2019 10:30 AM 6.8 (H) 4.6 - 6.5 % Final    Comment:    Glycemic Control Guidelines for People with Diabetes:Non Diabetic:  <6%Goal of Therapy: <7%Additional Action Suggested:  >8%     CBG: Recent Labs  Lab 06/02/20 0637 06/02/20 1150 06/02/20 1731 06/02/20 2119 06/03/20 0609  GLUCAP 206* 186* 141* 138* 167*    Recent Results (from the past 240 hour(s))  SARS Coronavirus 2 by RT PCR (hospital order, performed in [place] hospital lab) Nasopharyngeal Nasopharyngeal Swab     Status: None   Collection Time: 05/29/20  2:06 PM   Specimen: Nasopharyngeal Swab  Result Value Ref Range Status   SARS Coronavirus 2 NEGATIVE NEGATIVE Final    Comment: (NOTE) SARS-CoV-2 target nucleic acids are NOT DETECTED.  The SARS-CoV-2 RNA is generally detectable in upper and lower respiratory specimens during the acute phase of infection. The lowest concentration of SARS-CoV-2 viral copies this assay can detect is 250 copies / mL. A negative result does not preclude SARS-CoV-2 infection and should not be used as the sole basis for treatment or other patient management decisions.  A negative result may occur with improper specimen collection / handling, submission of specimen other than nasopharyngeal swab, presence of viral mutation(s) within the areas targeted by this assay, and inadequate number of viral copies (<250 copies / mL). A negative  result must be combined with clinical observations, patient history, and epidemiological information.  Fact Sheet for Patients:   StrictlyIdeas.no  Fact Sheet for Healthcare Providers: BankingDealers.co.za  This test is not yet approved or  cleared by the [place] FDA and has been authorized for detection and/or diagnosis of SARS-CoV-2 by FDA under an Emergency Use Authorization (EUA).  This EUA will remain in effect (meaning this test can be used) for the duration of the COVID-19 declaration under Section 564(b)(1) of the Act, 21 U.S.C. section 360bbb-3(b)(1), unless the authorization is terminated or revoked sooner.  Performed at [facility], [place] [address] [place], [place] [address]   Urine culture     Status: None   Collection Time: 05/29/20  6:10 PM   Specimen: Urine, Random  Result Value Ref Range Status   Specimen Description URINE, RANDOM  Final   Special Requests NONE  Final  Culture   Final    NO GROWTH Performed at [facility], [place] [address], [place], [place] [address]    Report Status 05/30/2020 FINAL  Final  C Difficile Quick Screen w PCR reflex     Status: None   Collection Time: 05/30/20  1:22 PM   Specimen: STOOL  Result Value Ref Range Status   C Diff antigen NEGATIVE NEGATIVE Final   C Diff toxin NEGATIVE NEGATIVE Final   C Diff interpretation No C. difficile detected.  Final    Comment: Performed at [facility], [place] [address] [place], [place] [address]     Scheduled Meds:  acebutolol  200 mg Oral QHS   amLODipine  5 mg Oral BH-q7a   atorvastatin  10 mg Oral Daily   docusate sodium  100 mg Oral BID   enoxaparin (LOVENOX) injection  30 mg Subcutaneous Q24H   insulin aspart  0-15 Units Subcutaneous TID WC   insulin aspart  0-5 Units Subcutaneous QHS   levETIRAcetam  500 mg Oral BID   levothyroxine  100 mcg Oral Q0600      LOS: 4 days   Cherene Altes,  MD [facility] Office  [phone] Pager - Text Page per Shea Evans  If 7PM-7AM, please contact night-coverage per Amion 06/03/2020, 9:44 AM

## 2020-06-04 LAB — GLUCOSE, CAPILLARY
Glucose-Capillary: 186 mg/dL — ABNORMAL HIGH (ref 70–99)
Glucose-Capillary: 161 mg/dL — ABNORMAL HIGH (ref 70–99)
Glucose-Capillary: 147 mg/dL — ABNORMAL HIGH (ref 70–99)
Glucose-Capillary: 165 mg/dL — ABNORMAL HIGH (ref 70–99)

## 2020-06-04 NOTE — Social Work (Signed)
CSW spoke with RN and confirmed telesitter has been discontinued as of 12P.   Criss Alvine, [place] Social Worker

## 2020-06-04 NOTE — Progress Notes (Signed)
[name]  DEY:[id number] DOB: 03/28/1919 DOA: 05/29/2020 PCP: [name], MD    Brief Narrative:  86 year old with a history of HTN, HLD, DM2, glaucoma, hypothyroidism, anxiety disorder, and breast cancer who presented to the ED with altered mental status.  She was admitted to the hospital 05/26/2020 with an episode of syncope at which time there was concern she may have suffered a seizure and she was placed on Keppra.  She returned to the hospital 7/12 with another episode of altered mental status after becoming unresponsive in the waiting area of her PCPs office.  Significant Events: 7/13 TTE -EF 65-70% -grade 1 DD 7/14 carotid dopplers -   Antimicrobials:  None  Subjective: Resting in bed comfortably at the time of my visit.  Assessment & Plan:  Recurrent syncopal episodes -possible seizure activity -altered mental status CT head unrevealing - MRI brain noting only chronic small vessel ischemic change - UA unremarkable -EEG did not reveal epileptic discharges at time of testing -TSH normal -ammonia normal -Keppra increased per neurology -discontinuing/decreasing all other sedating medications -TTE unremarkable -unable to tolerate MRA - ?related to neurontin; have now discontinued it fully - D14 and folic acid are not low - a.m. cortisol level within normal range - continue to allow time to clear Neurontin - medically ready for placement in SNF when bed available  Nonbloody diarrhea POA C. difficile testing negative - diarrhea resolved   Incidental meningioma Noted on MRI brain and exhibiting no significant mass effect  Acute kidney injury on CKD stage IIIb Baseline creatinine 1.4 with creatinine 2.2 at admission - likely due to poor intake and increased GI losses in setting of diarrhea - stable near baseline with gentle hydration  Hypokalemia Due to poor intake and GI losses -corrected with supplementation -recheck intermittently  Hypomagnesemia Due to poor intake  and GI losses -corrected with supplementation  DM2 uncontrolled with peripheral neuropathy Discontinued Neurontin altogether due to concern it could be related to above -CBG well controlled  Hypothyroidism Continue Synthroid -TSH in normal range  Essential hypertension With consistent elevation of blood pressure will gently adjust medical therapy and follow   DVT prophylaxis: Lovenox Code Status: No code Family Communication: No family present at time of exam today Status is: Inpatient  Remains inpatient appropriate because:Unsafe d/c plan and Inpatient level of care appropriate due to severity of illness   Dispo: The patient is from: Home              Anticipated d/c is to: SNF              Anticipated d/c date is: 1 day              Patient currently is medically stable to d/c.   Consultants:  Neurology  Objective: Blood pressure (!) 168/40, pulse 62, temperature 98.3 F (36.8 C), temperature source Oral, resp. rate 18, weight 74.8 kg, SpO2 98 %.  Intake/Output Summary (Last 24 hours) at 06/04/2020 0917 Last data filed at 06/03/2020 1600 Gross per 24 hour  Intake 50 ml  Output --  Net 50 ml   Filed Weights   05/29/20 1415  Weight: 74.8 kg    Examination: General: NAD Lungs: CTA B without wheezing Cardiovascular: RRR  Abdomen: NT/ND, soft, bs+, no mass  Extremities: No edema B lower extremities  CBC: Recent Labs  Lab 05/29/20 1400 05/30/20 0820 05/31/20 0545  WBC 9.5 7.5 9.3  NEUTROABS 7.0  --   --   HGB 11.6*  12.5 13.0  HCT 34.2* 36.2 37.6  MCV 96.6 95.0 94.9  PLT 196 219 354   Basic Metabolic Panel: Recent Labs  Lab 05/31/20 0545 06/01/20 0308 06/03/20 1801  NA 139 139 139  K 3.0* 3.3* 3.9  CL 104 105 106  CO2 24 23 20*  GLUCOSE 165* 169* 160*  BUN 15 17 18   CREATININE 1.39* 1.48* 1.43*  CALCIUM 8.8* 8.7* 9.3  MG 1.6* 2.0 1.7   GFR: Estimated Creatinine Clearance: 23.2 mL/min (A) (by C-G formula based on SCr of 1.43 mg/dL  (H)).  Liver Function Tests: Recent Labs  Lab 05/29/20 1400 05/30/20 0820 05/31/20 0545 06/03/20 1801  AST 13* 13* 18 16  ALT 11 11 13 14   ALKPHOS 56 57 60 57  BILITOT 0.6 0.8 0.6 0.8  PROT 5.9* 6.1* 6.3* 6.8  ALBUMIN 2.9* 2.8* 3.0* 2.9*    Recent Labs  Lab 05/29/20 1400  AMMONIA 22    HbA1C: Hgb A1c MFr Bld  Date/Time Value Ref Range Status  01/31/2020 04:27 PM 6.3 4.6 - 6.5 % Final    Comment:    Glycemic Control Guidelines for People with Diabetes:Non Diabetic:  <6%Goal of Therapy: <7%Additional Action Suggested:  >8%   06/08/2019 10:30 AM 6.8 (H) 4.6 - 6.5 % Final    Comment:    Glycemic Control Guidelines for People with Diabetes:Non Diabetic:  <6%Goal of Therapy: <7%Additional Action Suggested:  >8%     CBG: Recent Labs  Lab 06/03/20 0609 06/03/20 1115 06/03/20 1600 06/03/20 2133 06/04/20 0604  GLUCAP 167* 148* 142* 157* 186*    Recent Results (from the past 240 hour(s))  SARS Coronavirus 2 by RT PCR (hospital order, performed in [place] hospital lab) Nasopharyngeal Nasopharyngeal Swab     Status: None   Collection Time: 05/29/20  2:06 PM   Specimen: Nasopharyngeal Swab  Result Value Ref Range Status   SARS Coronavirus 2 NEGATIVE NEGATIVE Final    Comment: (NOTE) SARS-CoV-2 target nucleic acids are NOT DETECTED.  The SARS-CoV-2 RNA is generally detectable in upper and lower respiratory specimens during the acute phase of infection. The lowest concentration of SARS-CoV-2 viral copies this assay can detect is 250 copies / mL. A negative result does not preclude SARS-CoV-2 infection and should not be used as the sole basis for treatment or other patient management decisions.  A negative result may occur with improper specimen collection / handling, submission of specimen other than nasopharyngeal swab, presence of viral mutation(s) within the areas targeted by this assay, and inadequate number of viral copies (<250 copies / mL). A negative result  must be combined with clinical observations, patient history, and epidemiological information.  Fact Sheet for Patients:   StrictlyIdeas.no  Fact Sheet for Healthcare Providers: BankingDealers.co.za  This test is not yet approved or  cleared by the [place] FDA and has been authorized for detection and/or diagnosis of SARS-CoV-2 by FDA under an Emergency Use Authorization (EUA).  This EUA will remain in effect (meaning this test can be used) for the duration of the COVID-19 declaration under Section 564(b)(1) of the Act, 21 U.S.C. section 360bbb-3(b)(1), unless the authorization is terminated or revoked sooner.  Performed at [facility], [place] [address], [place], [place] [address]   Urine culture     Status: None   Collection Time: 05/29/20  6:10 PM   Specimen: Urine, Random  Result Value Ref Range Status   Specimen Description URINE, RANDOM  Final   Special Requests NONE  Final   Culture   Final    NO GROWTH Performed at [facility], [place] [address] [place], [place] [address]    Report Status 05/30/2020 FINAL  Final  C Difficile Quick Screen w PCR reflex     Status: None   Collection Time: 05/30/20  1:22 PM   Specimen: STOOL  Result Value Ref Range Status   C Diff antigen NEGATIVE NEGATIVE Final   C Diff toxin NEGATIVE NEGATIVE Final   C Diff interpretation No C. difficile detected.  Final    Comment: Performed at [facility], [place] [address], [place], [place] [address]     Scheduled Meds: . acebutolol  200 mg Oral QHS  . amLODipine  10 mg Oral BH-q7a  . atorvastatin  10 mg Oral Daily  . docusate sodium  100 mg Oral BID  . enoxaparin (LOVENOX) injection  30 mg Subcutaneous Q24H  . insulin aspart  0-15 Units Subcutaneous TID WC  . insulin aspart  0-5 Units Subcutaneous QHS  . levETIRAcetam  500 mg Oral BID  . levothyroxine  100 mcg Oral Q0600      LOS: 5 days   Cherene Altes, MD [facility] Office  [phone] Pager - Text Page per Amion  If 7PM-7AM, please contact night-coverage per Amion 06/04/2020, 9:17 AM

## 2020-06-05 LAB — GLUCOSE, CAPILLARY
Glucose-Capillary: 215 mg/dL — ABNORMAL HIGH (ref 70–99)
Glucose-Capillary: 197 mg/dL — ABNORMAL HIGH (ref 70–99)
Glucose-Capillary: 187 mg/dL — ABNORMAL HIGH (ref 70–99)
Glucose-Capillary: 203 mg/dL — ABNORMAL HIGH (ref 70–99)
Glucose-Capillary: 176 mg/dL — ABNORMAL HIGH (ref 70–99)

## 2020-06-05 NOTE — Progress Notes (Signed)
Muskan Bolla Wagler  DUK:[id number] DOB: 15-Mar-1919 DOA: 05/29/2020 PCP: [name], MD    Brief Narrative:  86 year old with a history of HTN, HLD, DM2, glaucoma, hypothyroidism, anxiety disorder, and breast cancer who presented to the ED with altered mental status.  She was admitted to the hospital 05/26/2020 with an episode of syncope at which time there was concern she may have suffered a seizure and she was placed on Keppra.  She returned to the hospital 7/12 with another episode of altered mental status after becoming unresponsive in the waiting area of her PCPs office.  Significant Events: 7/13 TTE -EF 65-70% -grade 1 DD 7/14 carotid dopplers -   Antimicrobials:  None  Subjective: Resting quietly at the time of my visit.  [name] states she has been more alert and conversant over the past 24 hours.  She is eating good.  No new issues.  Awaiting SNF placement.  Currently medically stable for discharge once a safe discharge disposition can be assured.  Assessment & Plan:  Recurrent syncopal episodes -possible seizure activity -altered mental status CT head unrevealing - MRI brain noting only chronic small vessel ischemic change - UA unremarkable -EEG did not reveal epileptic discharges at time of testing -TSH normal -ammonia normal -Keppra increased per neurology -discontinuing/decreasing all other sedating medications -TTE unremarkable -unable to tolerate MRA - ?related to neurontin; have now discontinued it fully - B76 and folic acid are not low - a.m. cortisol level within normal range - continue to allow time to clear Neurontin - medically ready for placement in SNF when bed available  Nonbloody diarrhea POA C. difficile testing negative - diarrhea resolved   Incidental meningioma Noted on MRI brain and exhibiting no significant mass effect  Acute kidney injury on CKD stage IIIb Baseline creatinine 1.4 with creatinine 2.2 at admission - likely due to poor intake  and increased GI losses in setting of diarrhea - stable near baseline with gentle hydration  Hypokalemia Due to poor intake and GI losses -corrected with supplementation -recheck intermittently  Hypomagnesemia Due to poor intake and GI losses -corrected with supplementation  DM2 uncontrolled with peripheral neuropathy Discontinued Neurontin altogether due to concern it could be related to above -CBG reasonably well controlled  Hypothyroidism Continue Synthroid -TSH in normal range  Essential hypertension Blood pressure medications being gently titrated   DVT prophylaxis: Lovenox Code Status: No code Family Communication: Spoke with daughter at bedside Status is: Inpatient  Remains inpatient appropriate because:Unsafe d/c plan and Inpatient level of care appropriate due to severity of illness   Dispo: The patient is from: Home              Anticipated d/c is to: SNF              Anticipated d/c date is: 1 day              Patient currently is medically stable to d/c.   Consultants:  Neurology  Objective: Blood pressure (!) 168/70, pulse 66, temperature 98.6 F (37 C), temperature source Oral, resp. rate 20, weight 74.8 kg, SpO2 98 %.  Intake/Output Summary (Last 24 hours) at 06/05/2020 0904 Last data filed at 06/04/2020 1605 Gross per 24 hour  Intake 100 ml  Output 200 ml  Net -100 ml   Filed Weights   05/29/20 1415  Weight: 74.8 kg    Examination: General: NAD Lungs: CTA B without wheezing Cardiovascular: RRR  Abdomen: NT/ND, soft, bs+, no mass  Extremities: No  edema BLE  CBC: Recent Labs  Lab 05/29/20 1400 05/30/20 0820 05/31/20 0545  WBC 9.5 7.5 9.3  NEUTROABS 7.0  --   --   HGB 11.6* 12.5 13.0  HCT 34.2* 36.2 37.6  MCV 96.6 95.0 94.9  PLT 196 219 063   Basic Metabolic Panel: Recent Labs  Lab 05/31/20 0545 06/01/20 0308 06/03/20 1801  NA 139 139 139  K 3.0* 3.3* 3.9  CL 104 105 106  CO2 24 23 20*  GLUCOSE 165* 169* 160*  BUN 15 17 18     CREATININE 1.39* 1.48* 1.43*  CALCIUM 8.8* 8.7* 9.3  MG 1.6* 2.0 1.7   GFR: Estimated Creatinine Clearance: 23.2 mL/min (A) (by C-G formula based on SCr of 1.43 mg/dL (H)).  Liver Function Tests: Recent Labs  Lab 05/29/20 1400 05/30/20 0820 05/31/20 0545 06/03/20 1801  AST 13* 13* 18 16  ALT 11 11 13 14   ALKPHOS 56 57 60 57  BILITOT 0.6 0.8 0.6 0.8  PROT 5.9* 6.1* 6.3* 6.8  ALBUMIN 2.9* 2.8* 3.0* 2.9*    Recent Labs  Lab 05/29/20 1400  AMMONIA 22    HbA1C: Hgb A1c MFr Bld  Date/Time Value Ref Range Status  01/31/2020 04:27 PM 6.3 4.6 - 6.5 % Final    Comment:    Glycemic Control Guidelines for People with Diabetes:Non Diabetic:  <6%Goal of Therapy: <7%Additional Action Suggested:  >8%   06/08/2019 10:30 AM 6.8 (H) 4.6 - 6.5 % Final    Comment:    Glycemic Control Guidelines for People with Diabetes:Non Diabetic:  <6%Goal of Therapy: <7%Additional Action Suggested:  >8%     CBG: Recent Labs  Lab 06/04/20 1134 06/04/20 1609 06/04/20 2129 06/05/20 0623 06/05/20 0847  GLUCAP 161* 147* 165* 215* 197*    Recent Results (from the past 240 hour(s))  SARS Coronavirus 2 by RT PCR (hospital order, performed in [place] hospital lab) Nasopharyngeal Nasopharyngeal Swab     Status: None   Collection Time: 05/29/20  2:06 PM   Specimen: Nasopharyngeal Swab  Result Value Ref Range Status   SARS Coronavirus 2 NEGATIVE NEGATIVE Final    Comment: (NOTE) SARS-CoV-2 target nucleic acids are NOT DETECTED.  The SARS-CoV-2 RNA is generally detectable in upper and lower respiratory specimens during the acute phase of infection. The lowest concentration of SARS-CoV-2 viral copies this assay can detect is 250 copies / mL. A negative result does not preclude SARS-CoV-2 infection and should not be used as the sole basis for treatment or other patient management decisions.  A negative result may occur with improper specimen collection / handling, submission of specimen  other than nasopharyngeal swab, presence of viral mutation(s) within the areas targeted by this assay, and inadequate number of viral copies (<250 copies / mL). A negative result must be combined with clinical observations, patient history, and epidemiological information.  Fact Sheet for Patients:   StrictlyIdeas.no  Fact Sheet for Healthcare Providers: BankingDealers.co.za  This test is not yet approved or  cleared by the [place] FDA and has been authorized for detection and/or diagnosis of SARS-CoV-2 by FDA under an Emergency Use Authorization (EUA).  This EUA will remain in effect (meaning this test can be used) for the duration of the COVID-19 declaration under Section 564(b)(1) of the Act, 21 U.S.C. section 360bbb-3(b)(1), unless the authorization is terminated or revoked sooner.  Performed at [facility], [place] [address] [place], [place] [address]   Urine culture     Status: None  Collection Time: 05/29/20  6:10 PM   Specimen: Urine, Random  Result Value Ref Range Status   Specimen Description URINE, RANDOM  Final   Special Requests NONE  Final   Culture   Final    NO GROWTH Performed at [facility], [address], [place], [place] [address]    Report Status 05/30/2020 FINAL  Final  C Difficile Quick Screen w PCR reflex     Status: None   Collection Time: 05/30/20  1:22 PM   Specimen: STOOL  Result Value Ref Range Status   C Diff antigen NEGATIVE NEGATIVE Final   C Diff toxin NEGATIVE NEGATIVE Final   C Diff interpretation No C. difficile detected.  Final    Comment: Performed at [facility], [place] [address], [place], [place] [address]     Scheduled Meds: . acebutolol  200 mg Oral QHS  . amLODipine  10 mg Oral BH-q7a  . atorvastatin  10 mg Oral Daily  . docusate sodium  100 mg Oral BID  . enoxaparin (LOVENOX) injection  30 mg Subcutaneous Q24H  . insulin aspart  0-15 Units Subcutaneous  TID WC  . insulin aspart  0-5 Units Subcutaneous QHS  . levETIRAcetam  500 mg Oral BID  . levothyroxine  100 mcg Oral Q0600      LOS: 6 days   Cherene Altes, MD [facility] Office  [phone] Pager - Text Page per Amion  If 7PM-7AM, please contact night-coverage per Amion 06/05/2020, 9:04 AM

## 2020-06-06 LAB — GLUCOSE, CAPILLARY
Glucose-Capillary: 188 mg/dL — ABNORMAL HIGH (ref 70–99)
Glucose-Capillary: 177 mg/dL — ABNORMAL HIGH (ref 70–99)
Glucose-Capillary: 260 mg/dL — ABNORMAL HIGH (ref 70–99)
Glucose-Capillary: 237 mg/dL — ABNORMAL HIGH (ref 70–99)
Glucose-Capillary: 286 mg/dL — ABNORMAL HIGH (ref 70–99)

## 2020-06-06 LAB — SARS CORONAVIRUS 2 BY RT PCR (HOSPITAL ORDER, PERFORMED IN ~~LOC~~ HOSPITAL LAB): SARS Coronavirus 2: NEGATIVE

## 2020-06-06 NOTE — Consult Note (Signed)
   [facility] Inpatient Consult   06/06/2020  CARIDAD SILVEIRA 10/28/1919 [id number]   [facility] [ACO] Patient: Medicare  Patient screened for high risk score for unplanned readmission score and for hospitalizations to check if potential [employer] service needs.  Review of patient's medical record reveals patient is being recommended for a skilled nursing facility level of care for transitional needs.  Plan:  Patient is currently awaiting a facility, if she transitions to a [facility] affiliated SNF, will have the [facility] RN PAC alerted of transition.  Continue to follow progress and disposition to assess for post hospital care management needs.    Please place a [facility] Care Management consult as appropriate and for questions contact:   Natividad Brood, RN BSN [facility] Liaison  [phone] business mobile phone Toll free office [phone]  Fax number: [phone] [email or web address]

## 2020-06-06 NOTE — Progress Notes (Signed)
[name]  ZOX:[id number] DOB: [birth year] DOA: 05/29/2020 PCP: [name], MD    Brief Narrative:  86 year old with a history of HTN, HLD, DM2, glaucoma, hypothyroidism, anxiety disorder, and breast cancer who presented to the ED with altered mental status.  She was admitted to the hospital 05/26/2020 with an episode of syncope at which time there was concern she may have suffered a seizure and she was placed on Keppra.  She returned to the hospital 7/12 with another episode of altered mental status after becoming unresponsive in the waiting area of her PCPs office.  Significant Events: 7/13 TTE -EF 65-70% -grade 1 DD 7/14 carotid dopplers -   Antimicrobials:  None  Subjective: Sitting up in a bedside chair at the time of my visit.  More alert than I have seen her since I took over her care.  Pleasant and interactive though mildly confused.  Denies acute complaints.  I spoke with the patient's daughter at bedside at length.  We continue to await procurement of an SNF bed at which time the patient will be discharged as she remains medically stable for discharge.  Assessment & Plan:  Recurrent syncopal episodes -possible seizure activity -altered mental status CT head unrevealing - MRI brain noting only chronic small vessel ischemic change - UA unremarkable -EEG did not reveal epileptic discharges at time of testing -TSH normal -ammonia normal -Keppra increased per Neurology - discontinued/decreased all other sedating medications - TTE unremarkable -unable to tolerate MRA - ?related to neurontin which has been discontinued - W11 and folic acid are not low - a.m. cortisol level within normal range -significant improvement in mental acuity has been noted over the last 24 hours  -the suspicion presently is that the patient's altered mental status was due to the combination of Neurontin induced sedation and a possible postictal state from seizure activity - medically ready for placement  in SNF when bed available  Nonbloody diarrhea POA C. difficile testing negative - diarrhea resolved   Incidental meningioma Noted on MRI brain and exhibiting no significant mass effect  Acute kidney injury on CKD stage IIIb Baseline creatinine 1.4 with creatinine 2.2 at admission - likely due to poor intake and increased GI losses in setting of diarrhea - stable near baseline with gentle hydration  Hypokalemia Due to poor intake and GI losses -corrected with supplementation -recheck intermittently  Hypomagnesemia Due to poor intake and GI losses -corrected with supplementation  DM2 uncontrolled with peripheral neuropathy Discontinued Neurontin altogether due to concern it could be related to above -CBG reasonably well controlled  Hypothyroidism Continue Synthroid -TSH in normal range  Essential hypertension Blood pressure medications being gently titrated   DVT prophylaxis: Lovenox Code Status: No code Family Communication: Spoke with daughter at bedside Status is: Inpatient  Remains inpatient appropriate because:Unsafe d/c plan and Inpatient level of care appropriate due to severity of illness   Dispo: The patient is from: Home              Anticipated d/c is to: SNF              Anticipated d/c date is: 1 day              Patient currently is medically stable to d/c.   Consultants:  Neurology  Objective: Blood pressure (!) 141/61, pulse 60, temperature 97.6 F (36.4 C), temperature source Oral, resp. rate 13, weight 74.8 kg, SpO2 100 %.  Intake/Output Summary (Last 24 hours) at 06/06/2020 1553 Last  data filed at 06/06/2020 1300 Gross per 24 hour  Intake 592 ml  Output 112 ml  Net 480 ml   Filed Weights   05/29/20 1415  Weight: 74.8 kg    Examination: General: NAD Lungs: CTA B  Cardiovascular: RRR  Abdomen: NT/ND, soft, bs+, no mass  Extremities: No edema bilateral lower extremities  CBC: Recent Labs  Lab 05/31/20 0545  WBC 9.3  HGB 13.0  HCT  37.6  MCV 94.9  PLT 528   Basic Metabolic Panel: Recent Labs  Lab 05/31/20 0545 06/01/20 0308 06/03/20 1801  NA 139 139 139  K 3.0* 3.3* 3.9  CL 104 105 106  CO2 24 23 20*  GLUCOSE 165* 169* 160*  BUN 15 17 18   CREATININE 1.39* 1.48* 1.43*  CALCIUM 8.8* 8.7* 9.3  MG 1.6* 2.0 1.7   GFR: Estimated Creatinine Clearance: 23.2 mL/min (A) (by C-G formula based on SCr of 1.43 mg/dL (H)).  Liver Function Tests: Recent Labs  Lab 05/31/20 0545 06/03/20 1801  AST 18 16  ALT 13 14  ALKPHOS 60 57  BILITOT 0.6 0.8  PROT 6.3* 6.8  ALBUMIN 3.0* 2.9*    HbA1C: Hgb A1c MFr Bld  Date/Time Value Ref Range Status  01/31/2020 04:27 PM 6.3 4.6 - 6.5 % Final    Comment:    Glycemic Control Guidelines for People with Diabetes:Non Diabetic:  <6%Goal of Therapy: <7%Additional Action Suggested:  >8%   06/08/2019 10:30 AM 6.8 (H) 4.6 - 6.5 % Final    Comment:    Glycemic Control Guidelines for People with Diabetes:Non Diabetic:  <6%Goal of Therapy: <7%Additional Action Suggested:  >8%     CBG: Recent Labs  Lab 06/05/20 1530 06/05/20 2136 06/06/20 0622 06/06/20 0806 06/06/20 1126  GLUCAP 203* 176* 188* 177* 260*    Recent Results (from the past 240 hour(s))  SARS Coronavirus 2 by RT PCR (hospital order, performed in [place] hospital lab) Nasopharyngeal Nasopharyngeal Swab     Status: None   Collection Time: 05/29/20  2:06 PM   Specimen: Nasopharyngeal Swab  Result Value Ref Range Status   SARS Coronavirus 2 NEGATIVE NEGATIVE Final    Comment: (NOTE) SARS-CoV-2 target nucleic acids are NOT DETECTED.  The SARS-CoV-2 RNA is generally detectable in upper and lower respiratory specimens during the acute phase of infection. The lowest concentration of SARS-CoV-2 viral copies this assay can detect is 250 copies / mL. A negative result does not preclude SARS-CoV-2 infection and should not be used as the sole basis for treatment or other patient management decisions.  A negative  result may occur with improper specimen collection / handling, submission of specimen other than nasopharyngeal swab, presence of viral mutation(s) within the areas targeted by this assay, and inadequate number of viral copies (<250 copies / mL). A negative result must be combined with clinical observations, patient history, and epidemiological information.  Fact Sheet for Patients:   StrictlyIdeas.no  Fact Sheet for Healthcare Providers: BankingDealers.co.za  This test is not yet approved or  cleared by the [place] FDA and has been authorized for detection and/or diagnosis of SARS-CoV-2 by FDA under an Emergency Use Authorization (EUA).  This EUA will remain in effect (meaning this test can be used) for the duration of the COVID-19 declaration under Section 564(b)(1) of the Act, 21 U.S.C. section 360bbb-3(b)(1), unless the authorization is terminated or revoked sooner.  Performed at [facility], [place] [address], [place], [place] [address]   Urine culture  Status: None   Collection Time: 05/29/20  6:10 PM   Specimen: Urine, Random  Result Value Ref Range Status   Specimen Description URINE, RANDOM  Final   Special Requests NONE  Final   Culture   Final    NO GROWTH Performed at [facility], [address] [place], [place] [address]    Report Status 05/30/2020 FINAL  Final  C Difficile Quick Screen w PCR reflex     Status: None   Collection Time: 05/30/20  1:22 PM   Specimen: STOOL  Result Value Ref Range Status   C Diff antigen NEGATIVE NEGATIVE Final   C Diff toxin NEGATIVE NEGATIVE Final   C Diff interpretation No C. difficile detected.  Final    Comment: Performed at [facility], [place] [address] [place], [place] [address]  SARS Coronavirus 2 by RT PCR (hospital order, performed in [facility] hospital lab) Nasopharyngeal Nasopharyngeal Swab     Status: None   Collection Time: 06/06/20  4:00 AM     Specimen: Nasopharyngeal Swab  Result Value Ref Range Status   SARS Coronavirus 2 NEGATIVE NEGATIVE Final    Comment: (NOTE) SARS-CoV-2 target nucleic acids are NOT DETECTED.  The SARS-CoV-2 RNA is generally detectable in upper and lower respiratory specimens during the acute phase of infection. The lowest concentration of SARS-CoV-2 viral copies this assay can detect is 250 copies / mL. A negative result does not preclude SARS-CoV-2 infection and should not be used as the sole basis for treatment or other patient management decisions.  A negative result may occur with improper specimen collection / handling, submission of specimen other than nasopharyngeal swab, presence of viral mutation(s) within the areas targeted by this assay, and inadequate number of viral copies (<250 copies / mL). A negative result must be combined with clinical observations, patient history, and epidemiological information.  Fact Sheet for Patients:   StrictlyIdeas.no  Fact Sheet for Healthcare Providers: BankingDealers.co.za  This test is not yet approved or  cleared by the [place] FDA and has been authorized for detection and/or diagnosis of SARS-CoV-2 by FDA under an Emergency Use Authorization (EUA).  This EUA will remain in effect (meaning this test can be used) for the duration of the COVID-19 declaration under Section 564(b)(1) of the Act, 21 U.S.C. section 360bbb-3(b)(1), unless the authorization is terminated or revoked sooner.  Performed at [facility], [place] [address], [place], [place] [address]      Scheduled Meds: . acebutolol  200 mg Oral QHS  . amLODipine  10 mg Oral BH-q7a  . atorvastatin  10 mg Oral Daily  . docusate sodium  100 mg Oral BID  . enoxaparin (LOVENOX) injection  30 mg Subcutaneous Q24H  . insulin aspart  0-15 Units Subcutaneous TID WC  . insulin aspart  0-5 Units Subcutaneous QHS  . levETIRAcetam  500 mg  Oral BID  . levothyroxine  100 mcg Oral Q0600      LOS: 7 days   Cherene Altes, MD [facility] Office  [phone] Pager - Text Page per Amion  If 7PM-7AM, please contact night-coverage per Amion 06/06/2020, 3:53 PM

## 2020-06-06 NOTE — TOC Progression Note (Signed)
Transition of Care [facility]) - Progression Note    Patient Details  Name: [name] MRN: [id number] Date of Birth: 1919/07/24  Transition of Care [facility]) CM/SW [place], [place] Phone Number: 06/06/2020, 12:14 PM  Clinical Narrative:   CSW received update from [facility], the family's first choice, that they are unable to offer a bed for the patient. CSW contacted daughter [name] to provide update and ask for alternate choices, and she asked about [facility]. CSW asked [facility] to review referral, awaiting response.       Barriers to Discharge: Continued Medical Work up  Expected Discharge Plan and [place] Choice: [place] arrangements for the past 2 months: Single Family Home                                       Social Determinants of Health (SDOH) Interventions    Readmission Risk Interventions No flowsheet data found.

## 2020-06-06 NOTE — Progress Notes (Signed)
Physical Therapy Treatment Patient Details Name: [name] MRN: [id number] DOB: Feb 27, 1919 Today's Date: 06/06/2020    History of Present Illness 86 y.o. female with medical history significant of HTN; HLD; DM; glaucoma; anxiety; hypothyroidism; and R breast cancer presenting with AMS.  Admitted with recurrent syncopal episodes, ? seizures, hypokalemia.    PT Comments    Pt demonstrating gradual progress today.  She required mod A of 2 for transfers and took a few steps but fatigued easily.  Pt required max multimodal cues for initiation of transfers and exercises.  Pt remains weaker than baseline and unable to return home safely.  Cont POC and recommendation for SNF.   Follow Up Recommendations  SNF     Equipment Recommendations  None recommended by PT    Recommendations for Other Services       Precautions / Restrictions Precautions Precautions: Fall;Other (comment) Precaution Comments: loose stools/bowel incontinence    Mobility  Bed Mobility Overal bed mobility: Needs Assistance Bed Mobility: Supine to Sit;Rolling Rolling: Mod assist;+2 for physical assistance   Supine to sit: Mod assist;+2 for physical assistance     General bed mobility comments: Increased assist to initiate but then pt was able to assist with transfers  Transfers Overall transfer level: Needs assistance Equipment used: Rolling walker (2 wheeled) Transfers: Sit to/from Stand Sit to Stand: Mod assist;+2 physical assistance         General transfer comment: mod A x 2 to power up; attempted with HHA but unable and added RW; pt with flexed posture  Ambulation/Gait Ambulation/Gait assistance: Mod assist;+2 physical assistance Gait Distance (Feet): 2 Feet Assistive device: Rolling walker (2 wheeled) Gait Pattern/deviations: Step-to pattern;Decreased stride length;Shuffle;Trunk flexed Gait velocity: decreased   General Gait Details: Trunk and knees flexed; Pt took 1-2 steps and then legs  weakned and required mod A x 2 to complete steps to chair   Stairs             Wheelchair Mobility    Modified Rankin (Stroke Patients Only)       Balance Overall balance assessment: Needs assistance Sitting-balance support: Feet supported;No upper extremity supported Sitting balance-Leahy Scale: Fair     Standing balance support: Bilateral upper extremity supported;During functional activity Standing balance-Leahy Scale: Poor Standing balance comment: reliant on external support                            Cognition Arousal/Alertness: Lethargic Behavior During Therapy: WFL for tasks assessed/performed Overall Cognitive Status: Impaired/Different from baseline Area of Impairment: Orientation;Attention;Memory;Following commands;Safety/judgement;Awareness;Problem solving                 Orientation Level: Disoriented to;Time;Situation   Memory: Decreased short-term memory Following Commands: Follows one step commands inconsistently Safety/Judgement: Decreased awareness of safety;Decreased awareness of deficits   Problem Solving: Difficulty sequencing;Requires verbal cues;Slow processing;Decreased initiation;Requires tactile cues General Comments: Pt was able to participate and interact verbally today.  Did require increased time to arouse but once sitting EOB was awake and able to participate      Exercises General Exercises - Lower Extremity Ankle Circles/Pumps: AROM;Both;20 reps;Seated Long Arc Quad: PROM;Both;15 reps;Seated Hip Flexion/Marching: AROM;Both;15 reps;Seated    General Comments General comments (skin integrity, edema, etc.): Pt daughter present end of session      Pertinent Vitals/Pain Pain Assessment: No/denies pain    Home Living  Prior Function            PT Goals (current goals can now be found in the care plan section) Acute Rehab PT Goals Patient Stated Goal: not stated PT Goal  Formulation: With patient/family Time For Goal Achievement: 06/15/20 Potential to Achieve Goals: Fair Progress towards PT goals: Progressing toward goals    Frequency    Min 2X/week      PT Plan Current plan remains appropriate    Co-evaluation PT/OT/SLP Co-Evaluation/Treatment: Yes Reason for Co-Treatment: For patient/therapist safety;Other (comment) (case managment needed note early for insurance, pt +2, tech help not available in am so saw PT/OT)          AM-PAC PT "6 Clicks" Mobility   Outcome Measure  Help needed turning from your back to your side while in a flat bed without using bedrails?: A Lot Help needed moving from lying on your back to sitting on the side of a flat bed without using bedrails?: A Lot Help needed moving to and from a bed to a chair (including a wheelchair)?: A Lot Help needed standing up from a chair using your arms (e.g., wheelchair or bedside chair)?: A Lot Help needed to walk in hospital room?: Total Help needed climbing 3-5 steps with a railing? : Total 6 Click Score: 10    End of Session Equipment Utilized During Treatment: Gait belt Activity Tolerance: Patient limited by fatigue Patient left: with chair alarm set;in chair;with call bell/phone within reach;with family/visitor present Nurse Communication: Mobility status (need for +2 pivot) PT Visit Diagnosis: Muscle weakness (generalized) (M62.81);Difficulty in walking, not elsewhere classified (R26.2);History of falling (Z91.81)     Time: 1035-1101 PT Time Calculation (min) (ACUTE ONLY): 26 min  Charges:  $Therapeutic Activity: 8-22 mins                     Abran Richard, PT Acute Rehab Services Pager [phone] [facility] [place] 06/06/2020, 11:11 AM

## 2020-06-06 NOTE — Progress Notes (Signed)
Occupational Therapy Treatment Patient Details Name: [name] MRN: [id number] DOB: 06-Jul-1919 Today's Date: 06/06/2020    History of present illness 86 y.o. female with medical history significant of HTN; HLD; DM; glaucoma; anxiety; hypothyroidism; and R breast cancer presenting with AMS.  Admitted with recurrent syncopal episodes, ? seizures, hypokalemia.   OT comments  Pt seen in conjunction with PT to ,maximize participation and activity tolerance. Pt initially lethargic upon arrival, but did arouse more with multimodal cues and increased time. Pt continues to present with cognitive deficits, decreased activity tolerance, and generalized weakness impacting pts ability to complete BADLs. Pt able to progress OOB to chair with RW and MOD A +2. Pt would continue to benefit from skilled occupational therapy while admitted and after d/c to address the below listed limitations in order to improve overall functional mobility and facilitate independence with BADL participation. DC plan remains appropriate, will follow acutely per POC.    Follow Up Recommendations  SNF;Supervision/Assistance - 24 hour    Equipment Recommendations  Other (comment) (TBD at next venue of care)    Recommendations for Other Services      Precautions / Restrictions Precautions Precautions: Fall;Other (comment) Precaution Comments: loose stools/bowel incontinence Restrictions Weight Bearing Restrictions: No       Mobility Bed Mobility Overal bed mobility: Needs Assistance Bed Mobility: Supine to Sit;Rolling Rolling: Mod assist;+2 for physical assistance   Supine to sit: Mod assist;+2 for physical assistance     General bed mobility comments: Increased assist to initiate but then pt was able to assist with transfers  Transfers Overall transfer level: Needs assistance Equipment used: Rolling walker (2 wheeled) Transfers: Sit to/from [facility] Sit to Stand: Mod assist;+2 physical  assistance Stand pivot transfers: Mod assist;+2 physical assistance       General transfer comment: mod A x 2 to power up; attempted with HHA but unable and added RW; pt with flexed posture. pt was able to pivot to recliner with MOD HHA. pt with BLE weakness noted knees to buckle during transfer needing up to MAX A    Balance Overall balance assessment: Needs assistance Sitting-balance support: Feet supported;No upper extremity supported Sitting balance-Leahy Scale: Fair Sitting balance - Comments: close minguard for safety   Standing balance support: Bilateral upper extremity supported;During functional activity Standing balance-Leahy Scale: Poor Standing balance comment: reliant on external support                           ADL either performed or assessed with clinical judgement   ADL Overall ADL's : Needs assistance/impaired     Grooming: Brushing hair;Sitting;Supervision/safety;Set up           Upper Body Dressing : Minimal assistance;Sitting;Cueing for sequencing Upper Body Dressing Details (indicate cue type and reason): Min A with cues for sequencing Lower Body Dressing: Bed level;Total assistance Lower Body Dressing Details (indicate cue type and reason): to don brief from bed level Toilet Transfer: Moderate assistance;+2 for physical assistance;Stand-pivot Toilet Transfer Details (indicate cue type and reason): simulated via stand pivot transfer to recliner with MOD A +2 d/t BLE weakness and fatigue         Functional mobility during ADLs: Moderate assistance;+2 for physical assistance (stand pivot only) General ADL Comments: pt with BLE weakness needing MOD A +2 to pivot     Vision       Perception     Praxis      Cognition Arousal/Alertness: Lethargic;Awake/alert (initially  lethargic but aroused more as session progressed) Behavior During Therapy: WFL for tasks assessed/performed Overall Cognitive Status: Impaired/Different from  baseline Area of Impairment: Orientation;Attention;Memory;Following commands;Safety/judgement;Awareness;Problem solving                 Orientation Level: Disoriented to;Time;Situation (able to state she was in "cone" unable to state why) Current Attention Level: Focused Memory: Decreased short-term memory Following Commands: Follows one step commands inconsistently Safety/Judgement: Decreased awareness of safety;Decreased awareness of deficits Awareness: Emergent Problem Solving: Difficulty sequencing;Requires verbal cues;Slow processing;Decreased initiation;Requires tactile cues General Comments: Pt was able to participate and interact verbally today.  Did require increased time to arouse but once sitting EOB was awake and able to participate        Exercises General Exercises - Lower Extremity Ankle Circles/Pumps: AROM;Both;20 reps;Seated Long Arc Quad: PROM;Both;15 reps;Seated Hip Flexion/Marching: AROM;Both;15 reps;Seated   Shoulder Instructions       General Comments pts daughter and other family member present at end of session    Pertinent Vitals/ Pain       Pain Assessment: No/denies pain  Home Living                                          Prior Functioning/Environment              Frequency  Min 2X/week        Progress Toward Goals  OT Goals(current goals can now be found in the care plan section)  Progress towards OT goals: Progressing toward goals  Acute Rehab OT Goals Patient Stated Goal: not stated Time For Goal Achievement: 06/15/20 Potential to Achieve Goals: Good  Plan Discharge plan remains appropriate;Frequency remains appropriate    Co-evaluation      Reason for Co-Treatment: For patient/therapist safety;To address functional/ADL transfers   OT goals addressed during session: ADL's and self-care      AM-PAC OT "6 Clicks" Daily Activity     Outcome Measure   Help from another person eating meals?:  None Help from another person taking care of personal grooming?: A Little Help from another person toileting, which includes using toliet, bedpan, or urinal?: A Lot Help from another person bathing (including washing, rinsing, drying)?: A Lot Help from another person to put on and taking off regular upper body clothing?: A Little Help from another person to put on and taking off regular lower body clothing?: A Lot 6 Click Score: 16    End of Session Equipment Utilized During Treatment: Gait belt;Rolling walker  OT Visit Diagnosis: Unsteadiness on feet (R26.81);Muscle weakness (generalized) (M62.81);History of falling (Z91.81);Other symptoms and signs involving cognitive function;Pain   Activity Tolerance Patient tolerated treatment well   Patient Left in chair;with call bell/phone within reach;with chair alarm set   Nurse Communication Mobility status;Other (comment) (needs new purewick; +2 with gait belt to pivot back to bed)        Time: 1035-1101 OT Time Calculation (min): 26 min  Charges: OT General Charges $OT Visit: 1 Visit OT Treatments $Self Care/Home Management : 8-22 mins  Lanier Clam., COTA/L Acute Rehabilitation Services [phone]    Ihor Gully 06/06/2020, 11:19 AM

## 2020-06-06 NOTE — Plan of Care (Signed)
  Problem: Education: Goal: Knowledge of General Education information will improve Description: Including pain rating scale, medication(s)/side effects and non-pharmacologic comfort measures Outcome: Progressing   Problem: Pain Managment: Goal: General experience of comfort will improve Outcome: Progressing   Problem: Safety: Goal: Ability to remain free from injury will improve Outcome: Progressing   

## 2020-06-07 DIAGNOSIS — R55 Syncope and collapse: Secondary | ICD-10-CM

## 2020-06-07 DIAGNOSIS — R42 Dizziness and giddiness: Secondary | ICD-10-CM

## 2020-06-07 LAB — GLUCOSE, CAPILLARY
Glucose-Capillary: 217 mg/dL — ABNORMAL HIGH (ref 70–99)
Glucose-Capillary: 266 mg/dL — ABNORMAL HIGH (ref 70–99)
Glucose-Capillary: 170 mg/dL — ABNORMAL HIGH (ref 70–99)
Glucose-Capillary: 112 mg/dL — ABNORMAL HIGH (ref 70–99)

## 2020-06-07 NOTE — Progress Notes (Signed)
[facility] consulted to obtain IV access. Pt is currently not receiving IV meds or fluids. SecureChat sent to pt's nurse Juanda Crumble inquiring for reason IV was needed; he responded, "only needed for PRN, B/P meds, or an emergency, patient full code, no rush on it. Called unit and spoke with pt's night shift nurse, Merrily Pew. Educated about vein preservation and reducing infection risk by not placing "just in case" IV's. Further educated IV consult can be placed at later time if IV access becomes a necessity and status can be made STAT if it is an emergency. Also educated [facility] responds to all codes. Josh verbalized understanding.

## 2020-06-07 NOTE — Progress Notes (Signed)
PROGRESS NOTE    [name]  QPY:[id number] DOB: August 18, 1919 DOA: 05/29/2020 PCP: [name], MD   Brief Narrative:  HPI On 05/29/2020 by Dr. Karmen Bongo [name] is a 86 y.o. female with medical history significant of HTN; HLD; DM; glaucoma; anxiety; hypothyroidism; and R breast cancer presenting with AMS.  She was previously seen on 7/9 for syncope after falling off the commode.  There had been several episodes of falls and abnormal behavior including fecal/urinary incontinence after being found down; unresponsiveness with repetitive blinking movements; and repetitive grasping movements with her right hand suggestive of seizures.  Neurology recommended loading with Keppra and outpatient neuro f/u.    This AM, she was at the PCP for f/u.  She was snoozing (has been more somnolent since starting Keppra) and became unresponsive.  911 was called for transport.  In further discussion, the patient is having memory issues as well as apparent AV hallucinations.  She is becoming increasingly confused and has had multiple falls with inability to get herself up.  There was urinary and fecal incontinence but it is hard to know if this was related to the event itself or whether it was due to inability to get back up after the fall.  Her daughter hired nighttime caregivers 4 nights a week and stays with her the other 3 nights and also stays with her during the day most of the daytime hours.  Interim history She was admitted to the hospital 05/26/2020 with an episode of syncope at which time there was concern she may have suffered a seizure and she was placed on Keppra.  She returned to the hospital 7/12 with another episode of altered mental status after becoming unresponsive in the waiting area of her PCPs office. Assessment & Plan   Recurrent syncopal episodes, possible seizure activity with Acute metabolic encephalopathy  -CT head showed no evidence of acute intercranial  abnormality -MRI Brain: No acute reversible finding.  Chronic small vessel ischemic changes throughout the brain, many associated with punctate hemosiderin deposition.  No evidence of recent hemorrhage.  Incidental meningioma -UA unremarkable for infection -EEG did not reveal epileptic discharges at time of testing -Echocardiogram showed an EF of 65 to 12%, grade 1 diastolic dysfunction -Carotid Doppler showed dirty 9% bilateral ICA stenosis.  Normal flow dynamics her bilateral subclavian arteries. -TSH and ammonia levels were normal -Vitamin W58 and folic acid within normal limits, morning cortisol level within normal range -Neurology consulted and appreciated, Keppra was increased. -Other sedative medications were discontinued -Mental status improving some, likely a combination of Neurontin induced sedation and possible postictal state from seizure activity  Nonbloody diarrhea -Present on admission -C. difficile testing negative -Diarrhea has not resolved   Incidental meningioma -Noted on MRI brain and exhibiting no significant mass effect  Acute kidney injury on chronic kidney disease, stage IIIb -Resolved -Plan creatinine proximally 1.4, creatinine on admission was 2.2  -Suspect secondary to poor oral intake as well as increased GI losses from diarrhea  -Creatinine back down to 1.43 -Continue to monitor BMP  Hypokalemia -Likely due to poor oral intake as well as GI losses -Potassium was replaced, currently up to 3.9 -Continue to monitor BMP   Hypomagnesemia -Due to poor intake and GI losses  -Was given replacement  Diabetes mellitus, type II, uncontrolled with peripheral neuropathy  -Given altered mental status, Neurontin was discontinued -Continue insulin sliding scale and CBG monitoring  Hypothyroidism -TSH within normal limits -continue Synthroid  Essential hypertension -Continue amlodipine  DVT Prophylaxis Lovenox  Code Status: DNR  Family  Communication: None at bedside  Disposition Plan:  Status is: Inpatient  Remains inpatient appropriate because:Unsafe d/c plan   Dispo:  Patient From: Home  Planned Disposition: [place]  Expected discharge date: 1 day  Medically stable for discharge: Yes  Consultants Neurology  Procedures  Echocardiogram Carotid Doppler  Antibiotics   Anti-infectives (From admission, onward)   None      Subjective:   [name] seen and examined today.  Has no complaints this morning wants to go to sleep.  Objective:   Vitals:   06/06/20 2334 06/07/20 0410 06/07/20 0731 06/07/20 1121  BP: (!) 151/83 (!) 161/99 (!) 155/60 (!) 136/97  Pulse: 63 62 64 69  Resp: 18 18 14 18   Temp: (!) 97.3 F (36.3 C) 98.1 F (36.7 C) 97.8 F (36.6 C) 98.4 F (36.9 C)  TempSrc: Oral Oral Axillary Axillary  SpO2: 98% 96% 97% 98%  Weight:        Intake/Output Summary (Last 24 hours) at 06/07/2020 1413 Last data filed at 06/07/2020 1300 Gross per 24 hour  Intake 693 ml  Output --  Net 693 ml   Filed Weights   05/29/20 1415  Weight: 74.8 kg    Exam  General: Well developed, well nourished, elderly, NAD, appears stated age  55: NCAT, mucous membranes moist.   Cardiovascular: S1 S2 auscultated, RRR  Respiratory: Clear to auscultation bilaterally with equal chest rise  Abdomen: Soft, nontender, nondistended, + bowel sounds  Extremities: warm dry without cyanosis clubbing or edema  Neuro: AAOx1, nonfocal  Psych: Appropriate mood and affect   Data Reviewed: I have personally reviewed following labs and imaging studies  CBC: No results for input(s): WBC, NEUTROABS, HGB, HCT, MCV, PLT in the last 168 hours. Basic Metabolic Panel: Recent Labs  Lab 06/01/20 0308 06/03/20 1801  NA 139 139  K 3.3* 3.9  CL 105 106  CO2 23 20*  GLUCOSE 169* 160*  BUN 17 18  CREATININE 1.48* 1.43*  CALCIUM 8.7* 9.3  MG 2.0 1.7   GFR: Estimated Creatinine Clearance: 23.2  mL/min (A) (by C-G formula based on SCr of 1.43 mg/dL (H)). Liver Function Tests: Recent Labs  Lab 06/03/20 1801  AST 16  ALT 14  ALKPHOS 57  BILITOT 0.8  PROT 6.8  ALBUMIN 2.9*   No results for input(s): LIPASE, AMYLASE in the last 168 hours. No results for input(s): AMMONIA in the last 168 hours. Coagulation Profile: No results for input(s): INR, PROTIME in the last 168 hours. Cardiac Enzymes: No results for input(s): CKTOTAL, CKMB, CKMBINDEX, TROPONINI in the last 168 hours. BNP (last 3 results) No results for input(s): PROBNP in the last 8760 hours. HbA1C: No results for input(s): HGBA1C in the last 72 hours. CBG: Recent Labs  Lab 06/06/20 1126 06/06/20 1820 06/06/20 2117 06/07/20 0643 06/07/20 1116  GLUCAP 260* 237* 286* 217* 266*   Lipid Profile: No results for input(s): CHOL, HDL, LDLCALC, TRIG, CHOLHDL, LDLDIRECT in the last 72 hours. Thyroid Function Tests: No results for input(s): TSH, T4TOTAL, FREET4, T3FREE, THYROIDAB in the last 72 hours. Anemia Panel: No results for input(s): VITAMINB12, FOLATE, FERRITIN, TIBC, IRON, RETICCTPCT in the last 72 hours. Urine analysis:    Component Value Date/Time   COLORURINE YELLOW 05/29/2020 1810   APPEARANCEUR CLEAR 05/29/2020 1810   LABSPEC 1.009 05/29/2020 1810   PHURINE 5.0 05/29/2020 1810   GLUCOSEU NEGATIVE 05/29/2020 1810   GLUCOSEU NEGATIVE 01/31/2020 1627  HGBUR SMALL (A) 05/29/2020 1810   BILIRUBINUR NEGATIVE 05/29/2020 1810   BILIRUBINUR Negative 02/29/2020 1524   KETONESUR NEGATIVE 05/29/2020 1810   PROTEINUR 100 (A) 05/29/2020 1810   UROBILINOGEN 0.2 02/29/2020 1524   UROBILINOGEN 0.2 01/31/2020 1627   NITRITE NEGATIVE 05/29/2020 1810   LEUKOCYTESUR NEGATIVE 05/29/2020 1810   Sepsis Labs: @LABRCNTIP (procalcitonin:4,lacticidven:4)  ) Recent Results (from the past 240 hour(s))  SARS Coronavirus 2 by RT PCR (hospital order, performed in [facility] hospital lab) Nasopharyngeal Nasopharyngeal Swab      Status: None   Collection Time: 05/29/20  2:06 PM   Specimen: Nasopharyngeal Swab  Result Value Ref Range Status   SARS Coronavirus 2 NEGATIVE NEGATIVE Final    Comment: (NOTE) SARS-CoV-2 target nucleic acids are NOT DETECTED.  The SARS-CoV-2 RNA is generally detectable in upper and lower respiratory specimens during the acute phase of infection. The lowest concentration of SARS-CoV-2 viral copies this assay can detect is 250 copies / mL. A negative result does not preclude SARS-CoV-2 infection and should not be used as the sole basis for treatment or other patient management decisions.  A negative result may occur with improper specimen collection / handling, submission of specimen other than nasopharyngeal swab, presence of viral mutation(s) within the areas targeted by this assay, and inadequate number of viral copies (<250 copies / mL). A negative result must be combined with clinical observations, patient history, and epidemiological information.  Fact Sheet for Patients:   StrictlyIdeas.no  Fact Sheet for Healthcare Providers: BankingDealers.co.za  This test is not yet approved or  cleared by the [place] FDA and has been authorized for detection and/or diagnosis of SARS-CoV-2 by FDA under an Emergency Use Authorization (EUA).  This EUA will remain in effect (meaning this test can be used) for the duration of the COVID-19 declaration under Section 564(b)(1) of the Act, 21 U.S.C. section 360bbb-3(b)(1), unless the authorization is terminated or revoked sooner.  Performed at [facility], [place] [address] [place], [place] [address]   Urine culture     Status: None   Collection Time: 05/29/20  6:10 PM   Specimen: Urine, Random  Result Value Ref Range Status   Specimen Description URINE, RANDOM  Final   Special Requests NONE  Final   Culture   Final    NO GROWTH Performed at [facility], [place] [address], [place], [place] [address]    Report Status 05/30/2020 FINAL  Final  C Difficile Quick Screen w PCR reflex     Status: None   Collection Time: 05/30/20  1:22 PM   Specimen: STOOL  Result Value Ref Range Status   C Diff antigen NEGATIVE NEGATIVE Final   C Diff toxin NEGATIVE NEGATIVE Final   C Diff interpretation No C. difficile detected.  Final    Comment: Performed at [facility], [place] [address] [place], [place] [address]  SARS Coronavirus 2 by RT PCR (hospital order, performed in [facility] hospital lab) Nasopharyngeal Nasopharyngeal Swab     Status: None   Collection Time: 06/06/20  4:00 AM   Specimen: Nasopharyngeal Swab  Result Value Ref Range Status   SARS Coronavirus 2 NEGATIVE NEGATIVE Final    Comment: (NOTE) SARS-CoV-2 target nucleic acids are NOT DETECTED.  The SARS-CoV-2 RNA is generally detectable in upper and lower respiratory specimens during the acute phase of infection. The lowest concentration of SARS-CoV-2 viral copies this assay can detect is 250 copies / mL. A negative result  does not preclude SARS-CoV-2 infection and should not be used as the sole basis for treatment or other patient management decisions.  A negative result may occur with improper specimen collection / handling, submission of specimen other than nasopharyngeal swab, presence of viral mutation(s) within the areas targeted by this assay, and inadequate number of viral copies (<250 copies / mL). A negative result must be combined with clinical observations, patient history, and epidemiological information.  Fact Sheet for Patients:   StrictlyIdeas.no  Fact Sheet for Healthcare Providers: BankingDealers.co.za  This test is not yet approved or  cleared by the [place] FDA and has been authorized for detection and/or diagnosis of SARS-CoV-2 by FDA under an Emergency Use Authorization (EUA).  This EUA will remain in effect (meaning this  test can be used) for the duration of the COVID-19 declaration under Section 564(b)(1) of the Act, 21 U.S.C. section 360bbb-3(b)(1), unless the authorization is terminated or revoked sooner.  Performed at [facility], [place] [address], [place], [place] [address]       Radiology Studies: No results found.   Scheduled Meds:  acebutolol  200 mg Oral QHS   amLODipine  10 mg Oral BH-q7a   atorvastatin  10 mg Oral Daily   docusate sodium  100 mg Oral BID   enoxaparin (LOVENOX) injection  30 mg Subcutaneous Q24H   insulin aspart  0-15 Units Subcutaneous TID WC   insulin aspart  0-5 Units Subcutaneous QHS   levETIRAcetam  500 mg Oral BID   levothyroxine  100 mcg Oral Q0600   Continuous Infusions:   LOS: 8 days   Time Spent in minutes   30 minutes  Alekai Pocock D.O. on 06/07/2020 at 2:13 PM  Between 7am to 7pm - Please see pager noted on [email or web address]  After 7pm go to [email or web address]  And look for the night coverage person covering for me after hours  [facility] Office  [phone]

## 2020-06-07 NOTE — Plan of Care (Signed)
  Problem: Safety: Goal: Verbalization of understanding the information provided will improve Outcome: Progressing   Problem: Self-Concept: Goal: Level of anxiety will decrease Outcome: Progressing

## 2020-06-08 DIAGNOSIS — S0990XA Unspecified injury of head, initial encounter: Secondary | ICD-10-CM | POA: Diagnosis not present

## 2020-06-08 DIAGNOSIS — E559 Vitamin D deficiency, unspecified: Secondary | ICD-10-CM | POA: Diagnosis not present

## 2020-06-08 DIAGNOSIS — E039 Hypothyroidism, unspecified: Secondary | ICD-10-CM | POA: Diagnosis not present

## 2020-06-08 DIAGNOSIS — E1165 Type 2 diabetes mellitus with hyperglycemia: Secondary | ICD-10-CM | POA: Diagnosis not present

## 2020-06-08 DIAGNOSIS — W19XXXA Unspecified fall, initial encounter: Secondary | ICD-10-CM | POA: Diagnosis not present

## 2020-06-08 DIAGNOSIS — G9341 Metabolic encephalopathy: Secondary | ICD-10-CM | POA: Diagnosis not present

## 2020-06-08 DIAGNOSIS — J90 Pleural effusion, not elsewhere classified: Secondary | ICD-10-CM | POA: Diagnosis not present

## 2020-06-08 DIAGNOSIS — G4089 Other seizures: Secondary | ICD-10-CM | POA: Diagnosis not present

## 2020-06-08 DIAGNOSIS — E119 Type 2 diabetes mellitus without complications: Secondary | ICD-10-CM | POA: Diagnosis not present

## 2020-06-08 DIAGNOSIS — M2550 Pain in unspecified joint: Secondary | ICD-10-CM | POA: Diagnosis not present

## 2020-06-08 DIAGNOSIS — I129 Hypertensive chronic kidney disease with stage 1 through stage 4 chronic kidney disease, or unspecified chronic kidney disease: Secondary | ICD-10-CM | POA: Diagnosis not present

## 2020-06-08 DIAGNOSIS — R5383 Other fatigue: Secondary | ICD-10-CM | POA: Diagnosis not present

## 2020-06-08 DIAGNOSIS — Y939 Activity, unspecified: Secondary | ICD-10-CM | POA: Diagnosis not present

## 2020-06-08 DIAGNOSIS — E785 Hyperlipidemia, unspecified: Secondary | ICD-10-CM | POA: Diagnosis not present

## 2020-06-08 DIAGNOSIS — S069X9A Unspecified intracranial injury with loss of consciousness of unspecified duration, initial encounter: Secondary | ICD-10-CM | POA: Diagnosis not present

## 2020-06-08 DIAGNOSIS — R0902 Hypoxemia: Secondary | ICD-10-CM | POA: Diagnosis not present

## 2020-06-08 DIAGNOSIS — R569 Unspecified convulsions: Secondary | ICD-10-CM | POA: Diagnosis not present

## 2020-06-08 DIAGNOSIS — R54 Age-related physical debility: Secondary | ICD-10-CM | POA: Diagnosis not present

## 2020-06-08 DIAGNOSIS — R4182 Altered mental status, unspecified: Secondary | ICD-10-CM | POA: Diagnosis not present

## 2020-06-08 DIAGNOSIS — Z79890 Hormone replacement therapy (postmenopausal): Secondary | ICD-10-CM | POA: Diagnosis not present

## 2020-06-08 DIAGNOSIS — I10 Essential (primary) hypertension: Secondary | ICD-10-CM | POA: Diagnosis not present

## 2020-06-08 DIAGNOSIS — R404 Transient alteration of awareness: Secondary | ICD-10-CM | POA: Diagnosis not present

## 2020-06-08 DIAGNOSIS — E43 Unspecified severe protein-calorie malnutrition: Secondary | ICD-10-CM | POA: Diagnosis not present

## 2020-06-08 DIAGNOSIS — N1831 Chronic kidney disease, stage 3a: Secondary | ICD-10-CM | POA: Diagnosis not present

## 2020-06-08 DIAGNOSIS — Y999 Unspecified external cause status: Secondary | ICD-10-CM | POA: Diagnosis not present

## 2020-06-08 DIAGNOSIS — R2689 Other abnormalities of gait and mobility: Secondary | ICD-10-CM | POA: Diagnosis not present

## 2020-06-08 DIAGNOSIS — Z7189 Other specified counseling: Secondary | ICD-10-CM | POA: Diagnosis not present

## 2020-06-08 DIAGNOSIS — R42 Dizziness and giddiness: Secondary | ICD-10-CM | POA: Diagnosis not present

## 2020-06-08 DIAGNOSIS — R52 Pain, unspecified: Secondary | ICD-10-CM | POA: Diagnosis not present

## 2020-06-08 DIAGNOSIS — R1111 Vomiting without nausea: Secondary | ICD-10-CM | POA: Diagnosis not present

## 2020-06-08 DIAGNOSIS — F0390 Unspecified dementia without behavioral disturbance: Secondary | ICD-10-CM | POA: Diagnosis not present

## 2020-06-08 DIAGNOSIS — Z79899 Other long term (current) drug therapy: Secondary | ICD-10-CM | POA: Diagnosis not present

## 2020-06-08 DIAGNOSIS — F0391 Unspecified dementia with behavioral disturbance: Secondary | ICD-10-CM | POA: Diagnosis not present

## 2020-06-08 DIAGNOSIS — R197 Diarrhea, unspecified: Secondary | ICD-10-CM | POA: Diagnosis not present

## 2020-06-08 DIAGNOSIS — E876 Hypokalemia: Secondary | ICD-10-CM | POA: Diagnosis not present

## 2020-06-08 DIAGNOSIS — N1832 Chronic kidney disease, stage 3b: Secondary | ICD-10-CM | POA: Diagnosis not present

## 2020-06-08 DIAGNOSIS — Y929 Unspecified place or not applicable: Secondary | ICD-10-CM | POA: Diagnosis not present

## 2020-06-08 DIAGNOSIS — Z7984 Long term (current) use of oral hypoglycemic drugs: Secondary | ICD-10-CM | POA: Diagnosis not present

## 2020-06-08 DIAGNOSIS — N183 Chronic kidney disease, stage 3 unspecified: Secondary | ICD-10-CM | POA: Diagnosis not present

## 2020-06-08 DIAGNOSIS — C44319 Basal cell carcinoma of skin of other parts of face: Secondary | ICD-10-CM | POA: Diagnosis not present

## 2020-06-08 DIAGNOSIS — M6281 Muscle weakness (generalized): Secondary | ICD-10-CM | POA: Diagnosis not present

## 2020-06-08 DIAGNOSIS — E1142 Type 2 diabetes mellitus with diabetic polyneuropathy: Secondary | ICD-10-CM | POA: Diagnosis not present

## 2020-06-08 DIAGNOSIS — R55 Syncope and collapse: Secondary | ICD-10-CM | POA: Diagnosis not present

## 2020-06-08 DIAGNOSIS — N184 Chronic kidney disease, stage 4 (severe): Secondary | ICD-10-CM | POA: Diagnosis not present

## 2020-06-08 DIAGNOSIS — R402411 Glasgow coma scale score 13-15, in the field [EMT or ambulance]: Secondary | ICD-10-CM | POA: Diagnosis not present

## 2020-06-08 DIAGNOSIS — Z7401 Bed confinement status: Secondary | ICD-10-CM | POA: Diagnosis not present

## 2020-06-08 DIAGNOSIS — E1140 Type 2 diabetes mellitus with diabetic neuropathy, unspecified: Secondary | ICD-10-CM | POA: Diagnosis not present

## 2020-06-08 DIAGNOSIS — R627 Adult failure to thrive: Secondary | ICD-10-CM | POA: Diagnosis not present

## 2020-06-08 LAB — BASIC METABOLIC PANEL
Anion gap: 13 (ref 5–15)
BUN: 34 mg/dL — ABNORMAL HIGH (ref 8–23)
CO2: 23 mmol/L (ref 22–32)
Calcium: 9.3 mg/dL (ref 8.9–10.3)
Chloride: 105 mmol/L (ref 98–111)
Creatinine, Ser: 1.47 mg/dL — ABNORMAL HIGH (ref 0.44–1.00)
GFR calc Af Amer: 34 mL/min — ABNORMAL LOW (ref >60)
GFR calc non Af Amer: 29 mL/min — ABNORMAL LOW (ref >60)
Glucose, Bld: 220 mg/dL — ABNORMAL HIGH (ref 70–99)
Potassium: 3.6 mmol/L (ref 3.5–5.1)
Sodium: 141 mmol/L (ref 135–145)

## 2020-06-08 LAB — GLUCOSE, CAPILLARY
Glucose-Capillary: 218 mg/dL — ABNORMAL HIGH (ref 70–99)
Glucose-Capillary: 195 mg/dL — ABNORMAL HIGH (ref 70–99)
Glucose-Capillary: 365 mg/dL — ABNORMAL HIGH (ref 70–99)

## 2020-06-08 LAB — SARS CORONAVIRUS 2 (TAT 6-24 HRS): SARS Coronavirus 2: NEGATIVE

## 2020-06-08 MED ORDER — SENNOSIDES-DOCUSATE SODIUM 8.6-50 MG PO TABS: 2.0000 | ORAL_TABLET | Freq: Every evening | ORAL | Status: DC | PRN

## 2020-06-08 MED ORDER — POLYETHYLENE GLYCOL 3350 17 G PO PACK: 17.0000 g | PACK | Freq: Every day | ORAL | 0 refills | Status: DC | PRN

## 2020-06-08 MED ORDER — LEVETIRACETAM 500 MG PO TABS: 500.0000 mg | ORAL_TABLET | Freq: Two times a day (BID) | ORAL | Status: DC

## 2020-06-08 MED ORDER — AMLODIPINE BESYLATE 10 MG PO TABS: 10.0000 mg | ORAL_TABLET | ORAL | Status: DC

## 2020-06-08 MED ORDER — DOCUSATE SODIUM 100 MG PO CAPS: 100.0000 mg | ORAL_CAPSULE | Freq: Two times a day (BID) | ORAL | 0 refills | Status: DC

## 2020-06-08 MED ORDER — LOPERAMIDE HCL 2 MG PO CAPS: 4.0000 mg | ORAL_CAPSULE | ORAL | 0 refills | Status: DC | PRN

## 2020-06-08 NOTE — TOC Transition Note (Signed)
Transition of Care [facility]) - CM/SW Discharge Note   Patient Details  Name: [name] MRN: [id number] Date of Birth: 03-02-1919  [facility] CM/SW Contact:  Geralynn Ochs, LCSW Phone Number: 06/08/2020, 10:38 AM   Clinical Narrative:   Nurse to call report to [phone], room 113  Transport set for 12:00 PM.    Final next level of care: Skilled Nursing Facility Barriers to Discharge: Barriers Resolved   Patient Goals and CMS Choice Patient states their goals for this hospitalization and ongoing recovery are:: patient unable to participate in goal setting due to confusion CMS Medicare.gov Compare Post Acute Care list provided to:: Patient Represenative (must comment) Choice offered to / list presented to : Adult Children  Discharge Placement              Patient [facility]: [facility] Patient to be transferred to facility by: [place] Name of family member notified: [name] Patient and family notified of of transfer: 06/08/20  Discharge Plan and Services     Post Acute Care Choice: [place]                               Social Determinants of Health (SDOH) Interventions     Readmission Risk Interventions No flowsheet data found.

## 2020-06-08 NOTE — Progress Notes (Signed)
Occupational Therapy Treatment Patient Details Name: [name] MRN: [id number] DOB: 04-30-1919 Today's Date: 06/08/2020    History of present illness 86 y.o. female with medical history significant of HTN; HLD; DM; glaucoma; anxiety; hypothyroidism; and R breast cancer presenting with AMS.  Admitted with recurrent syncopal episodes, ? seizures, hypokalemia.   OT comments  Pt making steady progress towards OT goals this session. Pt continues to present with decreased activity tolerance, impaired balance, generalized weakness and baseline cognitive deficits impacting pts ability to complete BADLs. Pt required MOD A + 2 for bed mobility and MINA for sitting balance EOB. MOD A +2 to laterally scoot to recliner. Pt currently required total A for UB ADLs. DC plan remains appropriate, will follow acutely per POC.    Follow Up Recommendations  SNF;Supervision/Assistance - 24 hour    Equipment Recommendations  Other (comment) (TBD at next venue of care)    Recommendations for Other Services      Precautions / Restrictions Precautions Precautions: Fall;Other (comment) Precaution Comments: loose stools/bowel incontinence Restrictions Weight Bearing Restrictions: No       Mobility Bed Mobility   Bed Mobility: Rolling Rolling: Mod assist;+2 for physical assistance   Supine to sit: Mod assist;+2 for physical assistance     General bed mobility comments: pt required MOD A to roll R<>L for pericare with use ofo bed pads. pt able to assist with progressing BLEs to EOB but required MOD A to elevate trunk and maneuver BLEs completely off EOB  Transfers Overall transfer level: Needs assistance   Transfers: Lateral/Scoot Transfers          Lateral/Scoot Transfers: Mod assist;+2 physical assistance General transfer comment: once pt sitting EOB, pt attempting to scoot to chair. MOD A +2 with use of bed pad to laterally scoot pt to drop arm recliner    Balance Overall balance  assessment: Needs assistance Sitting-balance support: Feet supported;Bilateral upper extremity supported Sitting balance-Leahy Scale: Poor Sitting balance - Comments: pt required at least MIN A to maintain upright posture EOB Postural control: Posterior lean   Standing balance-Leahy Scale: Zero                             ADL either performed or assessed with clinical judgement   ADL Overall ADL's : Needs assistance/impaired     Grooming: Brushing hair;Sitting;Total assistance Grooming Details (indicate cue type and reason): pt declined participating in seated grooming tasks from recliner, pt needed total  A to complete task                 Toilet Transfer: Moderate assistance;+2 for physical assistance Toilet Transfer Details (indicate cue type and reason): simulated via functional mobility; MOD A +2 to laterally scoot to drop arm recliner Toileting- Clothing Manipulation and Hygiene: Total assistance;+2 for safety/equipment;Bed level Toileting - Clothing Manipulation Details (indicate cue type and reason): for posterior pericare from bed level after incontinent stool     Functional mobility during ADLs: Moderate assistance;+2 for physical assistance (lateral scoot to recliner) General ADL Comments: lateral scoot to transfer from EOB>recliner, pleasantly confused     Vision       Perception     Praxis      Cognition Arousal/Alertness: Awake/alert Behavior During Therapy: WFL for tasks assessed/performed Overall Cognitive Status: History of cognitive impairments - at baseline                   Orientation Level: Disoriented  to;Time Current Attention Level: Focused Memory: Decreased short-term memory Following Commands: Follows one step commands with increased time Safety/Judgement: Decreased awareness of safety;Decreased awareness of deficits Awareness: Emergent Problem Solving: Difficulty sequencing;Requires verbal cues;Slow  processing;Decreased initiation;Requires tactile cues General Comments: pt with history of dementia at baseline, however pleasantly confused        Exercises     Shoulder Instructions       General Comments pts daughter present during session, noted redness around buttock during pericare    Pertinent Vitals/ Pain       Pain Assessment: No/denies pain  Home Living                                          Prior Functioning/Environment              Frequency  Min 2X/week        Progress Toward Goals  OT Goals(current goals can now be found in the care plan section)  Progress towards OT goals: Progressing toward goals  Acute Rehab OT Goals Patient Stated Goal: not stated Time For Goal Achievement: 06/15/20 Potential to Achieve Goals: Good  Plan Discharge plan remains appropriate;Frequency remains appropriate    Co-evaluation                 AM-PAC OT "6 Clicks" Daily Activity     Outcome Measure   Help from another person eating meals?: A Little Help from another person taking care of personal grooming?: A Lot Help from another person toileting, which includes using toliet, bedpan, or urinal?: Total Help from another person bathing (including washing, rinsing, drying)?: A Lot Help from another person to put on and taking off regular upper body clothing?: A Lot Help from another person to put on and taking off regular lower body clothing?: A Lot 6 Click Score: 12    End of Session    OT Visit Diagnosis: Unsteadiness on feet (R26.81);Muscle weakness (generalized) (M62.81);History of falling (Z91.81);Other symptoms and signs involving cognitive function;Pain   Activity Tolerance Patient tolerated treatment well   Patient Left in chair;with call bell/phone within reach;with chair alarm set;with family/visitor present   Nurse Communication Mobility status        Time: 3220-2542 OT Time Calculation (min): 23 min  Charges: OT  General Charges $OT Visit: 1 Visit OT Treatments $Self Care/Home Management : 8-22 mins $Therapeutic Activity: 8-22 mins  Lanier Clam., COTA/L Acute Rehabilitation Services [phone]    Ihor Gully 06/08/2020, 10:25 AM

## 2020-06-08 NOTE — TOC Progression Note (Signed)
Transition of Care [facility]) - Progression Note    Patient Details  Name: IRIDESSA HARROW MRN: [id number] Date of Birth: 1919/08/27  Transition of Care [facility]) CM/SW [place], [place] Phone Number: 06/08/2020, 10:47 AM  Clinical Narrative:   CSW following for discharge plan. [facility] also unable to offer a bed for the patient. CSW spoke with daughter this morning and provided bed offers, she will review options and update with choice. CSW spoke with daughter several times throughout the day with her progress, and she ultimately chose [facility] for SNF. They can take her tomorrow, bed available at noon. CSW to follow.      Barriers to Discharge: Continued Medical Work up  Expected Discharge Plan and [place] Choice: [place] arrangements for the past 2 months: Single Family Home Expected Discharge Date: 06/08/20                                     Social Determinants of Health (SDOH) Interventions    Readmission Risk Interventions No flowsheet data found.

## 2020-06-08 NOTE — Progress Notes (Signed)
Patient being discharged to [facility].  Patient to be transported by [facility].  No IV present.  Discharge instructions and prescription information placed in the packet for transport.  Report given to [facility] at [facility].

## 2020-06-08 NOTE — Care Management Important Message (Signed)
Important Message  Patient Details  Name: ESPERANZA MADRAZO MRN: [id number] Date of Birth: 1919-06-18   Medicare Important Message Given:  Yes Patient left Prior to IM delivery.  IM mailed to the home address.  Leontina Skidmore 06/08/2020, 2:36 PM

## 2020-06-08 NOTE — Plan of Care (Signed)
  Problem: Coping: Goal: Level of anxiety will decrease Outcome: Adequate for Discharge   Problem: Pain Managment: Goal: General experience of comfort will improve Outcome: Adequate for Discharge   Problem: Safety: Goal: Ability to remain free from injury will improve Outcome: Adequate for Discharge

## 2020-06-08 NOTE — Discharge Summary (Addendum)
Physician Discharge Summary  PAMLEA FINDER MHD:[id number] DOB: [birth year] DOA: 05/29/2020  PCP: [name], MD  Admit date: 05/29/2020 Discharge date: 06/08/2020  Time spent: 45 minutes  Recommendations for Outpatient Follow-up:  Patient will be discharged to skilled nursing facility, continue physical and occupational therapy.  Patient will need to follow up with primary care provider within one week of discharge.  Patient should continue medications as prescribed.  Patient should follow a heart healthy/carb modified diet.   Discharge Diagnoses:  Recurrent syncopal episodes, possible seizure activity with Acute metabolic encephalopathy  Nonbloody diarrhea Incidental meningioma Acute kidney injury on chronic kidney disease, stage IIIb Hypokalemia Hypomagnesemia Diabetes mellitus, type II, uncontrolled with peripheral neuropathy  Hypothyroidism Essential hypertension  Discharge Condition: Stable  Diet recommendation: Heart healthy/carb modified  Filed Weights   05/29/20 1415  Weight: 74.8 kg    History of present illness:  On 05/29/2020 by Dr. Sharmaine Base Reepis a 86 y.o.femalewith medical history significant ofHTN; HLD; DM; glaucoma; anxiety;hypothyroidism;and R breast cancer presenting with AMS.She was previously seen on 7/9 for syncope after falling off the commode. There had been several episodes of falls and abnormal behavior including fecal/urinary incontinence after being found down; unresponsiveness with repetitive blinking movements; and repetitive grasping movements with her right hand suggestive of seizures. Neurology recommended loading with Keppra and outpatient neuro f/u.   This AM, she was at the PCP for f/u. She was snoozing (has been more somnolent since starting Keppra) and became unresponsive. 911 was called for transport.  In further discussion, the patient is having memory issues as well as apparent AV hallucinations. She is  becoming increasingly confused and has had multiple falls with inability to get herself up. There was urinary and fecal incontinence but it is hard to know if this was related to the event itself or whether it was due to inability to get back up after the fall. Her daughter hired nighttime caregivers 4 nights a week and stays with her the other 3 nights and also stays with her during the day most of the daytime hours.  Hospital Course:  Recurrent syncopal episodes, possible seizure activity with Acute metabolic encephalopathy  -CT head showed no evidence of acute intercranial abnormality -MRI Brain: No acute reversible finding.  Chronic small vessel ischemic changes throughout the brain, many associated with punctate hemosiderin deposition.  No evidence of recent hemorrhage.  Incidental meningioma -UA unremarkable for infection -EEG did not reveal epileptic discharges at time of testing -Echocardiogram showed an EF of 65 to 21%, grade 1 diastolic dysfunction -Carotid Doppler showed 1-39% bilateral ICA stenosis.  Normal flow dynamics- bilateral subclavian arteries. -TSH and ammonia levels were normal -Vitamin J94 and folic acid within normal limits, morning cortisol level within normal range -Neurology consulted and appreciated, Keppra was increased. -Other sedative medications were discontinued -Mental status improving some, likely a combination of Neurontin induced sedation and possible postictal state from seizure activity  Nonbloody diarrhea -Present on admission -C. difficile testing negative -Diarrhea has not resolved   Incidental meningioma -Noted on MRI brain and exhibiting no significant mass effect  Acute kidney injury on chronic kidney disease, stage IIIb -Resolved -Baseline creatinine proximally 1.4, creatinine on admission was 2.2  -Suspect secondary to poor oral intake as well as increased GI losses from diarrhea  -Creatinine back down to 1.47 -Repeat BMP in one  week  Hypokalemia -Likely due to poor oral intake as well as GI losses -Potassium was replaced, currently up to 3.6 -Repeat BMP  in one week  Hypomagnesemia -Due to poor intake and GI losses  -Was given replacement  Diabetes mellitus, type II, uncontrolled with peripheral neuropathy  -Given altered mental status, Neurontin was discontinued -Last hemoglobin A1c was 6.3 on 01/31/2020 -Continue home medications on discharge for diabetes control  Hypothyroidism -TSH within normal limits -continue Synthroid  Essential hypertension -Continue amlodipine, dose increased  Code status: DNR  Consultants Neurology  Procedures  Echocardiogram Carotid Doppler  Discharge Exam: Vitals:   06/08/20 0403 06/08/20 0747  BP: (!) 144/71 (!) 154/59  Pulse: 66 61  Resp: 18 18  Temp: 97.9 F (36.6 C) 97.8 F (36.6 C)  SpO2: 99% 97%     General: Well developed, well nourished, NAD, appears stated age  HEENT: NCAT, mucous membranes moist. Forehead abrasion/wound  Cardiovascular: S1 S2 auscultated  Respiratory: Clear to auscultation bilaterally with equal chest rise  Abdomen: Soft, nontender, nondistended, + bowel sounds  Extremities: warm dry without cyanosis clubbing or edema  Neuro: AAOx2 (self, place)  Psych: pleasant and appropriate  Discharge Instructions Discharge Instructions    Discharge instructions   Complete by: As directed    Patient will be discharged to skilled nursing facility, continue physical and occupational therapy.  Patient will need to follow up with primary care provider within one week of discharge.  Patient should continue medications as prescribed.  Patient should follow a heart healthy/carb modified diet.   Increase activity slowly   Complete by: As directed    No dressing needed   Complete by: As directed      Allergies as of 06/08/2020      Reactions   Ativan [lorazepam] Other (See Comments)   Aggressive behavior      Medication List     STOP taking these medications   gabapentin 100 MG capsule Commonly known as: NEURONTIN   telmisartan 80 MG tablet Commonly known as: MICARDIS     TAKE these medications   acebutolol 200 MG capsule Commonly known as: Sectral Take 1 capsule (200 mg total) by mouth at bedtime.   acetaminophen 500 MG tablet Commonly known as: TYLENOL Take 500 mg by mouth in the morning, at noon, and at bedtime.   amLODipine 10 MG tablet Commonly known as: NORVASC Take 1 tablet (10 mg total) by mouth every morning. Start taking on: June 09, 2020 What changed:   medication strength  how much to take   atorvastatin 10 MG tablet Commonly known as: LIPITOR Take 1 tablet (10 mg total) by mouth daily.   docusate sodium 100 MG capsule Commonly known as: COLACE Take 1 capsule (100 mg total) by mouth 2 (two) times daily.   glucose blood test strip Commonly known as: TrueTrack Test Use as instructed to check blood sugar once a day.  Dx E11.9 What changed:   how much to take  how to take this  when to take this   hydroxypropyl methylcellulose / hypromellose 2.5 % ophthalmic solution Commonly known as: ISOPTO TEARS / GONIOVISC Place 1 drop into both eyes 3 (three) times daily as needed for dry eyes.   levETIRAcetam 500 MG tablet Commonly known as: KEPPRA Take 1 tablet (500 mg total) by mouth 2 (two) times daily. What changed:   medication strength  how much to take   levothyroxine 100 MCG tablet Commonly known as: SYNTHROID Take 1 tablet (100 mcg total) by mouth daily before breakfast.   loperamide 2 MG capsule Commonly known as: IMODIUM Take 2 capsules (4 mg total) by mouth  as needed for diarrhea or loose stools.   polyethylene glycol 17 g packet Commonly known as: MIRALAX / GLYCOLAX Take 17 g by mouth daily as needed for moderate constipation or severe constipation.   PRESERVISION AREDS 2+MULTI VIT PO Take 1 capsule by mouth in the morning and at bedtime.    senna-docusate 8.6-50 MG tablet Commonly known as: Senokot-S Take 2 tablets by mouth at bedtime as needed for mild constipation or moderate constipation.   sitaGLIPtin 50 MG tablet Commonly known as: JANUVIA Take 1 tablet (50 mg total) by mouth every morning.   TRUEplus Lancets 33G Misc Use to check blood sugar once a day. Dx E11.9 What changed:   how much to take  how to take this  when to take this            Discharge Care Instructions  (From admission, onward)         Start     Ordered   06/08/20 0000  No dressing needed        06/08/20 1019         Allergies  Allergen Reactions  . Ativan [Lorazepam] Other (See Comments)    Aggressive behavior    Follow-up Information    [name], MD. Schedule an appointment as soon as possible for a visit in 1 week(s).   Specialty: Family Medicine Why: Hospital follow up Contact information: [place] [address] [phone]                The results of significant diagnostics from this hospitalization (including imaging, microbiology, ancillary and laboratory) are listed below for reference.    Significant Diagnostic Studies: EEG  Result Date: 05/29/2020 Lora Havens, MD     05/29/2020  5:53 PM Patient Name: [name] MRN: [id number] Epilepsy Attending: Lora Havens Referring Physician/Provider: Dr Karmen Bongo Date: 07/30/2020 Duration: 23.25 mins Patient history:  86 year old female presenting to the hospital with her second episode of unresponsiveness. EEG to evaluate for seizure. Level of alertness: Awake AEDs during EEG study: gabapentin, LEV Technical aspects: This EEG study was done with scalp electrodes positioned according to the 10-20 International system of electrode placement. Electrical activity was acquired at a sampling rate of 500Hz  and reviewed with a high frequency filter of 70Hz  and a low frequency filter of 1Hz . EEG data were recorded  continuously and digitally stored. Description: No clear posterior dominant rhythm consists of 9-10 Hz activity of moderate voltage (25-35 uV) seen predominantly in posterior head regions, symmetric and reactive to eye opening and eye closing. EEG showed continuous generalized 5 to 8 Hz theta-alpha activity. Hyperventilation and photic stimulation were not performed.   ABNORMALITY -Continuous slow, generalized IMPRESSION: This study is suggestive of mild to moderate diffuse encephalopathy, nonspecific etiology. No seizures or epileptiform discharges were seen throughout the recording. Lora Havens   CT Head Wo Contrast  Result Date: 05/26/2020 CLINICAL DATA:  Seizure unresponsive EXAM: CT HEAD WITHOUT CONTRAST TECHNIQUE: Contiguous axial images were obtained from the base of the skull through the vertex without intravenous contrast. COMPARISON:  CT brain 06/24/2019 FINDINGS: Brain: No acute territorial infarction, hemorrhage, or intracranial mass. Moderate atrophy. Mild white matter hypodensity consistent with chronic small vessel ischemic change. Stable ventricle size. Vascular: No hyperdense vessels. Vertebral and carotid vascular calcification Skull: No fracture. Stable right central skull base lucent lesion with internal calcifications and no associated soft tissue mass. This is probably benign. Sinuses/Orbits: Mucosal thickening in the maxillary  and ethmoid sinuses. Other: Right frontal scalp injury. IMPRESSION: 1. No CT evidence for acute intracranial abnormality. 2. Atrophy and mild chronic small vessel ischemic changes of the white matter. Electronically Signed   By: Donavan Foil M.D.   On: 05/26/2020 18:19   MR BRAIN WO CONTRAST  Result Date: 05/29/2020 CLINICAL DATA:  Altered mental status of unknown etiology. EXAM: MRI HEAD WITHOUT CONTRAST TECHNIQUE: Multiplanar, multiecho pulse sequences of the brain and surrounding structures were obtained without intravenous contrast. COMPARISON:  Head CT  05/26/2020 FINDINGS: Brain: Diffusion imaging does not show any acute or subacute infarction. Mild chronic small-vessel change affects the pons. No focal cerebellar finding. Cerebral hemispheres show age related atrophy with moderate chronic small-vessel ischemic changes of the white matter. No large vessel territory infarction. No intra-axial mass lesion, acute hemorrhage, hydrocephalus or extra-axial collection. There are scattered punctate foci of hemosiderin deposition associated with some of the old small vessel insults. There is a meningioma along the left side of the falx with a diameter of 15 mm and a thickness of 8 mm, incidental, not having any mass-effect upon the brain. Vascular: Major vessels at the base of the brain show flow. Skull and upper cervical spine: Negative Sinuses/Orbits: Clear/normal Other: None IMPRESSION: No acute or reversible finding. Age related atrophy. Chronic small-vessel ischemic changes throughout the brain, many associated with punctate hemosiderin deposition. No evidence of recent hemorrhage. Incidental meningioma along the left side of the falx with a diameter of 15 mm in thickness of 8 mm, not having any significant mass-effect upon the brain. Electronically Signed   By: Nelson Chimes M.D.   On: 05/29/2020 16:34   DG Chest Portable 1 View  Result Date: 05/29/2020 CLINICAL DATA:  Loss of consciousness EXAM: PORTABLE CHEST 1 VIEW COMPARISON:  None. FINDINGS: Multiple leads overlie the patient. The patient's hands overlie the lung bases. Low lung volumes. Lungs grossly clear. No significant pleural effusion. No pneumothorax. Cardiomediastinal contours are likely within normal limits for portable technique. IMPRESSION: No acute process the chest. Electronically Signed   By: Macy Mis M.D.   On: 05/29/2020 14:04   ECHOCARDIOGRAM COMPLETE  Result Date: 05/30/2020    ECHOCARDIOGRAM REPORT   Patient Name:   CARMIN ALVIDREZ Date of Exam: 05/30/2020 Medical Rec #:  [id number]     Height:       67.0 in Accession #:    [id number]   Weight:       164.9 lb Date of Birth:  1918-12-18    BSA:          1.863 m Patient Age:    37 years     BP:           143/70 mmHg Patient Gender: F            HR:           71 bpm. Exam Location:  Inpatient Procedure: 2D Echo, Cardiac Doppler and Color Doppler Indications:    R55 Syncope  History:        Patient has prior history of Echocardiogram examinations, most                 recent 08/01/2018. Signs/Symptoms:Altered Mental Status and                 Alzheimer's; Risk Factors:Hypertension and Diabetes. Breast                 cancer.  Sonographer:    Roseanna Rainbow RDCS Referring Phys:  [id number] ASHISH ARORA IMPRESSIONS  1. Left ventricular ejection fraction, by estimation, is 65 to 70%. The left ventricle has normal function. The left ventricle has no regional wall motion abnormalities. There is mild concentric left ventricular hypertrophy. Left ventricular diastolic parameters are consistent with Grade I diastolic dysfunction (impaired relaxation). Elevated left ventricular end-diastolic pressure.  2. Right ventricular systolic function is normal. The right ventricular size is mildly enlarged. Tricuspid regurgitation signal is inadequate for assessing PA pressure.  3. The mitral valve is normal in structure. Trivial mitral valve regurgitation. No evidence of mitral stenosis.  4. The aortic valve is tricuspid. Aortic valve regurgitation is not visualized. Mild aortic valve sclerosis is present, with no evidence of aortic valve stenosis.  5. The inferior vena cava is normal in size with greater than 50% respiratory variability, suggesting right atrial pressure of 3 mmHg. Comparison(s): No significant change from prior study. Conclusion(s)/Recommendation(s): Normal biventricular function without evidence of hemodynamically significant valvular heart disease. FINDINGS  Left Ventricle: Left ventricular ejection fraction, by estimation, is 65 to 70%. The left ventricle has  normal function. The left ventricle has no regional wall motion abnormalities. The left ventricular internal cavity size was normal in size. There is  mild concentric left ventricular hypertrophy. Left ventricular diastolic parameters are consistent with Grade I diastolic dysfunction (impaired relaxation). Elevated left ventricular end-diastolic pressure. Right Ventricle: The right ventricular size is mildly enlarged. No increase in right ventricular wall thickness. Right ventricular systolic function is normal. Tricuspid regurgitation signal is inadequate for assessing PA pressure. Left Atrium: Left atrial size was normal in size. Right Atrium: Right atrial size was normal in size. Pericardium: There is no evidence of pericardial effusion. Presence of pericardial fat pad. Mitral Valve: The mitral valve is normal in structure. Mild mitral annular calcification. Trivial mitral valve regurgitation. No evidence of mitral valve stenosis. Tricuspid Valve: The tricuspid valve is normal in structure. Tricuspid valve regurgitation is trivial. No evidence of tricuspid stenosis. Aortic Valve: The aortic valve is tricuspid. . There is mild thickening and mild calcification of the aortic valve. Aortic valve regurgitation is not visualized. Mild aortic valve sclerosis is present, with no evidence of aortic valve stenosis. There is mild thickening of the aortic valve. There is mild calcification of the aortic valve. Pulmonic Valve: The pulmonic valve was grossly normal. Pulmonic valve regurgitation is mild. No evidence of pulmonic stenosis. Aorta: The aortic root, ascending aorta and aortic arch are all structurally normal, with no evidence of dilitation or obstruction. Venous: The inferior vena cava is normal in size with greater than 50% respiratory variability, suggesting right atrial pressure of 3 mmHg. IAS/Shunts: The atrial septum is grossly normal.  LEFT VENTRICLE PLAX 2D LVIDd:         3.60 cm     Diastology LVIDs:          2.30 cm     LV e' lateral:   3.15 cm/s LV PW:         1.40 cm     LV E/e' lateral: 25.4 LV IVS:        1.30 cm     LV e' medial:    3.81 cm/s LVOT diam:     1.80 cm     LV E/e' medial:  21.0 LV SV:         60 LV SV Index:   32          2D Longitudinal Strain LVOT Area:     2.54 cm  2D Strain GLS (A2C):   -19.6 %                            2D Strain GLS (A3C):   -24.9 %                            2D Strain GLS (A4C):   -25.2 % LV Volumes (MOD)           2D Strain GLS Avg:     -23.2 % LV vol d, MOD A2C: 25.5 ml LV vol d, MOD A4C: 60.1 ml LV vol s, MOD A2C: 9.8 ml LV vol s, MOD A4C: 17.4 ml LV SV MOD A2C:     15.7 ml LV SV MOD A4C:     60.1 ml LV SV MOD BP:      28.1 ml RIGHT VENTRICLE             IVC RV S prime:     11.20 cm/s  IVC diam: 1.70 cm TAPSE (M-mode): 2.6 cm LEFT ATRIUM             Index      RIGHT ATRIUM           Index LA diam:        3.10 cm 1.66 cm/m RA Area:     10.70 cm LA Vol (A2C):   9.3 ml  5.00 ml/m RA Volume:   23.10 ml  12.40 ml/m LA Vol (A4C):   17.2 ml 9.23 ml/m LA Biplane Vol: 13.5 ml 7.25 ml/m  AORTIC VALVE             PULMONIC VALVE LVOT Vmax:   97.30 cm/s  PR End Diast Vel: 1.63 msec LVOT Vmean:  69.500 cm/s LVOT VTI:    0.237 m  AORTA Ao Root diam: 3.20 cm Ao Asc diam:  3.50 cm MITRAL VALVE MV Area (PHT): 3.72 cm     SHUNTS MV Decel Time: 204 msec     Systemic VTI:  0.24 m MV E velocity: 79.90 cm/s   Systemic Diam: 1.80 cm MV A velocity: 152.00 cm/s MV E/A ratio:  0.53 Buford Dresser MD Electronically signed by Buford Dresser MD Signature Date/Time: 05/30/2020/11:44:54 AM    Final    VAS US CAROTID  Result Date: 05/31/2020 Carotid Arterial Duplex Study Indications:       Syncope. Risk Factors:      Hypertension, hyperlipidemia. Comparison Study:  No prior. Performing Technologist: Oda Cogan RDMS, RVT  Examination Guidelines: A complete evaluation includes B-mode imaging, spectral Doppler, color Doppler, and power Doppler as needed of all accessible  portions of each vessel. Bilateral testing is considered an integral part of a complete examination. Limited examinations for reoccurring indications may be performed as noted.  Right Carotid Findings: +----------+--------+--------+--------+------------------+--------+           PSV cm/sEDV cm/sStenosisPlaque DescriptionComments +----------+--------+--------+--------+------------------+--------+ CCA Prox  71      6                                          +----------+--------+--------+--------+------------------+--------+ CCA Distal59      9                                          +----------+--------+--------+--------+------------------+--------+  ICA Prox  49      8       1-39%   calcific                   +----------+--------+--------+--------+------------------+--------+ ICA Distal107     17                                         +----------+--------+--------+--------+------------------+--------+ ECA       80      8                                          +----------+--------+--------+--------+------------------+--------+ +----------+--------+-------+----------------+-------------------+           PSV cm/sEDV cmsDescribe        Arm Pressure (mmHG) +----------+--------+-------+----------------+-------------------+ [id number]            Multiphasic, WNL                    +----------+--------+-------+----------------+-------------------+ +---------+--------+--+--------+-+---------+ VertebralPSV cm/s28EDV cm/s9Antegrade +---------+--------+--+--------+-+---------+  Left Carotid Findings: +----------+--------+--------+--------+------------------+------------------+           PSV cm/sEDV cm/sStenosisPlaque DescriptionComments           +----------+--------+--------+--------+------------------+------------------+ CCA Prox  96      13                                                    +----------+--------+--------+--------+------------------+------------------+ CCA Distal80      13                                intimal thickening +----------+--------+--------+--------+------------------+------------------+ ICA Prox  62      11      1-39%                     intimal thickening +----------+--------+--------+--------+------------------+------------------+ ICA Distal66      13                                                   +----------+--------+--------+--------+------------------+------------------+ ECA       81      10                                                   +----------+--------+--------+--------+------------------+------------------+ +----------+--------+--------+----------------+-------------------+           PSV cm/sEDV cm/sDescribe        Arm Pressure (mmHG) +----------+--------+--------+----------------+-------------------+ [id number]             Multiphasic, WNL                    +----------+--------+--------+----------------+-------------------+ +---------+--------+--------+--------------+ VertebralPSV cm/sEDV cm/sNot identified +---------+--------+--------+--------------+   Summary: Right Carotid: Velocities in the right ICA are consistent with a 1-39% stenosis. Left Carotid: Velocities in the left ICA are consistent with a 1-39% stenosis. Subclavians:  Normal flow hemodynamics were seen in bilateral subclavian              arteries. *See table(s) above for measurements and observations.  Electronically signed by Curt Jews MD on 05/31/2020 at 7:40:38 PM.    Final     Microbiology: Recent Results (from the past 240 hour(s))  SARS Coronavirus 2 by RT PCR (hospital order, performed in [facility] hospital lab) Nasopharyngeal Nasopharyngeal Swab     Status: None   Collection Time: 05/29/20  2:06 PM   Specimen: Nasopharyngeal Swab  Result Value Ref Range Status   SARS Coronavirus 2 NEGATIVE NEGATIVE Final    Comment:  (NOTE) SARS-CoV-2 target nucleic acids are NOT DETECTED.  The SARS-CoV-2 RNA is generally detectable in upper and lower respiratory specimens during the acute phase of infection. The lowest concentration of SARS-CoV-2 viral copies this assay can detect is 250 copies / mL. A negative result does not preclude SARS-CoV-2 infection and should not be used as the sole basis for treatment or other patient management decisions.  A negative result may occur with improper specimen collection / handling, submission of specimen other than nasopharyngeal swab, presence of viral mutation(s) within the areas targeted by this assay, and inadequate number of viral copies (<250 copies / mL). A negative result must be combined with clinical observations, patient history, and epidemiological information.  Fact Sheet for Patients:   StrictlyIdeas.no  Fact Sheet for Healthcare Providers: BankingDealers.co.za  This test is not yet approved or  cleared by the [place] FDA and has been authorized for detection and/or diagnosis of SARS-CoV-2 by FDA under an Emergency Use Authorization (EUA).  This EUA will remain in effect (meaning this test can be used) for the duration of the COVID-19 declaration under Section 564(b)(1) of the Act, 21 U.S.C. section 360bbb-3(b)(1), unless the authorization is terminated or revoked sooner.  Performed at [facility], [place] [address], [place], [place] [address]   Urine culture     Status: None   Collection Time: 05/29/20  6:10 PM   Specimen: Urine, Random  Result Value Ref Range Status   Specimen Description URINE, RANDOM  Final   Special Requests NONE  Final   Culture   Final    NO GROWTH Performed at [facility], [place] [address], [place], [place] [address]    Report Status 05/30/2020 FINAL  Final  C Difficile Quick Screen w PCR reflex     Status: None   Collection Time: 05/30/20  1:22 PM   Specimen:  STOOL  Result Value Ref Range Status   C Diff antigen NEGATIVE NEGATIVE Final   C Diff toxin NEGATIVE NEGATIVE Final   C Diff interpretation No C. difficile detected.  Final    Comment: Performed at [facility], [place] [address], [place], [place] [address]  SARS Coronavirus 2 by RT PCR (hospital order, performed in [facility] hospital lab) Nasopharyngeal Nasopharyngeal Swab     Status: None   Collection Time: 06/06/20  4:00 AM   Specimen: Nasopharyngeal Swab  Result Value Ref Range Status   SARS Coronavirus 2 NEGATIVE NEGATIVE Final    Comment: (NOTE) SARS-CoV-2 target nucleic acids are NOT DETECTED.  The SARS-CoV-2 RNA is generally detectable in upper and lower respiratory specimens during the acute phase of infection. The lowest concentration of SARS-CoV-2 viral copies this assay can detect is 250 copies / mL. A negative result does not preclude SARS-CoV-2 infection and should not be used as  the sole basis for treatment or other patient management decisions.  A negative result may occur with improper specimen collection / handling, submission of specimen other than nasopharyngeal swab, presence of viral mutation(s) within the areas targeted by this assay, and inadequate number of viral copies (<250 copies / mL). A negative result must be combined with clinical observations, patient history, and epidemiological information.  Fact Sheet for Patients:   StrictlyIdeas.no  Fact Sheet for Healthcare Providers: BankingDealers.co.za  This test is not yet approved or  cleared by the [place] FDA and has been authorized for detection and/or diagnosis of SARS-CoV-2 by FDA under an Emergency Use Authorization (EUA).  This EUA will remain in effect (meaning this test can be used) for the duration of the COVID-19 declaration under Section 564(b)(1) of the Act, 21 U.S.C. section 360bbb-3(b)(1), unless the authorization is terminated  or revoked sooner.  Performed at [facility], [place] [address], [place], [place] [address]      Labs: Basic Metabolic Panel: Recent Labs  Lab 06/03/20 1801 06/08/20 0506  NA 139 141  K 3.9 3.6  CL 106 105  CO2 20* 23  GLUCOSE 160* 220*  BUN 18 34*  CREATININE 1.43* 1.47*  CALCIUM 9.3 9.3  MG 1.7  --    Liver Function Tests: Recent Labs  Lab 06/03/20 1801  AST 16  ALT 14  ALKPHOS 57  BILITOT 0.8  PROT 6.8  ALBUMIN 2.9*   No results for input(s): LIPASE, AMYLASE in the last 168 hours. No results for input(s): AMMONIA in the last 168 hours. CBC: No results for input(s): WBC, NEUTROABS, HGB, HCT, MCV, PLT in the last 168 hours. Cardiac Enzymes: No results for input(s): CKTOTAL, CKMB, CKMBINDEX, TROPONINI in the last 168 hours. BNP: BNP (last 3 results) No results for input(s): BNP in the last 8760 hours.  ProBNP (last 3 results) No results for input(s): PROBNP in the last 8760 hours.  CBG: Recent Labs  Lab 06/07/20 1116 06/07/20 1620 06/07/20 2117 06/08/20 0616 06/08/20 0744  GLUCAP 266* 170* 112* 218* 195*       Signed:  Zephan Beauchaine  [facility] 06/08/2020, 10:20 AM

## 2020-06-09 ENCOUNTER — Ambulatory Visit: Payer: Medicare Other | Admitting: Family Medicine

## 2020-06-12 ENCOUNTER — Telehealth: Payer: Self-pay

## 2020-06-12 DIAGNOSIS — E43 Unspecified severe protein-calorie malnutrition: Secondary | ICD-10-CM | POA: Diagnosis not present

## 2020-06-12 DIAGNOSIS — C44319 Basal cell carcinoma of skin of other parts of face: Secondary | ICD-10-CM | POA: Diagnosis not present

## 2020-06-12 DIAGNOSIS — M6281 Muscle weakness (generalized): Secondary | ICD-10-CM | POA: Diagnosis not present

## 2020-06-12 DIAGNOSIS — W19XXXA Unspecified fall, initial encounter: Secondary | ICD-10-CM | POA: Diagnosis not present

## 2020-06-12 DIAGNOSIS — S0990XA Unspecified injury of head, initial encounter: Secondary | ICD-10-CM | POA: Diagnosis not present

## 2020-06-12 NOTE — Telephone Encounter (Signed)
[name] aware of message.

## 2020-06-12 NOTE — Telephone Encounter (Signed)
Patients daughter sent message regarding patient and her medication. Per daughter patient was taken off of Gabapentin she is currently in a nursing home for rehabilitation. Last office visit patient was taken to ED from out office do to no response but had pulse. Please advise message below.    As you know, you called 911 at your office for her about 3 weeks ago. She was admitted to [facility] and was there 12 days. During that time, the drs. there took her off her Gabapentin totally. She is now at [facility] in their rehab wing, still has no gabapentin and now her legs and feet give her pain. Should this sound right to you? She had EEG, MRI (1/2 only because she would not stay still), and CT scan and doctors still not sure what happened.  Thank you for your opinion.  [name]

## 2020-06-12 NOTE — Telephone Encounter (Signed)
Unfortunately I can not make any medication changes while she is admitted at rehab facility. I recommend she discuss use of gabapentin with the facility's provider.

## 2020-06-13 ENCOUNTER — Ambulatory Visit: Payer: Medicare Other | Admitting: Family Medicine

## 2020-06-19 DIAGNOSIS — G4089 Other seizures: Secondary | ICD-10-CM | POA: Diagnosis not present

## 2020-06-19 DIAGNOSIS — N1831 Chronic kidney disease, stage 3a: Secondary | ICD-10-CM | POA: Diagnosis not present

## 2020-06-19 DIAGNOSIS — R4182 Altered mental status, unspecified: Secondary | ICD-10-CM | POA: Diagnosis not present

## 2020-06-19 DIAGNOSIS — E1140 Type 2 diabetes mellitus with diabetic neuropathy, unspecified: Secondary | ICD-10-CM | POA: Diagnosis not present

## 2020-06-19 DIAGNOSIS — R5383 Other fatigue: Secondary | ICD-10-CM | POA: Diagnosis not present

## 2020-06-19 DIAGNOSIS — F0390 Unspecified dementia without behavioral disturbance: Secondary | ICD-10-CM | POA: Diagnosis not present

## 2020-06-22 DIAGNOSIS — N184 Chronic kidney disease, stage 4 (severe): Secondary | ICD-10-CM | POA: Diagnosis not present

## 2020-06-22 DIAGNOSIS — E559 Vitamin D deficiency, unspecified: Secondary | ICD-10-CM | POA: Diagnosis not present

## 2020-06-22 DIAGNOSIS — E876 Hypokalemia: Secondary | ICD-10-CM | POA: Diagnosis not present

## 2020-06-22 DIAGNOSIS — R197 Diarrhea, unspecified: Secondary | ICD-10-CM | POA: Diagnosis not present

## 2020-06-26 ENCOUNTER — Emergency Department (HOSPITAL_COMMUNITY): Payer: Medicare Other

## 2020-06-26 ENCOUNTER — Emergency Department (HOSPITAL_COMMUNITY)
Admission: EM | Admit: 2020-06-26 | Discharge: 2020-06-26 | Disposition: A | Discharge status: Home / Self Care | Payer: Medicare Other | Attending: Emergency Medicine | Admitting: Emergency Medicine

## 2020-06-26 DIAGNOSIS — Y999 Unspecified external cause status: Secondary | ICD-10-CM | POA: Insufficient documentation

## 2020-06-26 DIAGNOSIS — I129 Hypertensive chronic kidney disease with stage 1 through stage 4 chronic kidney disease, or unspecified chronic kidney disease: Secondary | ICD-10-CM | POA: Diagnosis not present

## 2020-06-26 DIAGNOSIS — E876 Hypokalemia: Secondary | ICD-10-CM | POA: Diagnosis not present

## 2020-06-26 DIAGNOSIS — W19XXXA Unspecified fall, initial encounter: Secondary | ICD-10-CM | POA: Diagnosis not present

## 2020-06-26 DIAGNOSIS — Y929 Unspecified place or not applicable: Secondary | ICD-10-CM | POA: Diagnosis not present

## 2020-06-26 DIAGNOSIS — Z79899 Other long term (current) drug therapy: Secondary | ICD-10-CM | POA: Insufficient documentation

## 2020-06-26 DIAGNOSIS — N183 Chronic kidney disease, stage 3 unspecified: Secondary | ICD-10-CM | POA: Insufficient documentation

## 2020-06-26 DIAGNOSIS — Z79890 Hormone replacement therapy (postmenopausal): Secondary | ICD-10-CM | POA: Insufficient documentation

## 2020-06-26 DIAGNOSIS — Y939 Activity, unspecified: Secondary | ICD-10-CM | POA: Insufficient documentation

## 2020-06-26 DIAGNOSIS — S069X9A Unspecified intracranial injury with loss of consciousness of unspecified duration, initial encounter: Secondary | ICD-10-CM | POA: Diagnosis not present

## 2020-06-26 DIAGNOSIS — E039 Hypothyroidism, unspecified: Secondary | ICD-10-CM | POA: Diagnosis not present

## 2020-06-26 DIAGNOSIS — Z7984 Long term (current) use of oral hypoglycemic drugs: Secondary | ICD-10-CM | POA: Diagnosis not present

## 2020-06-26 DIAGNOSIS — R55 Syncope and collapse: Secondary | ICD-10-CM | POA: Diagnosis not present

## 2020-06-26 DIAGNOSIS — J90 Pleural effusion, not elsewhere classified: Secondary | ICD-10-CM | POA: Diagnosis not present

## 2020-06-26 DIAGNOSIS — E119 Type 2 diabetes mellitus without complications: Secondary | ICD-10-CM | POA: Diagnosis not present

## 2020-06-26 LAB — CBC WITH DIFFERENTIAL/PLATELET
Abs Immature Granulocytes: 0.05 10*3/uL (ref 0.00–0.07)
Basophils Absolute: 0.1 10*3/uL (ref 0.0–0.1)
Basophils Relative: 1 %
Eosinophils Absolute: 0.1 10*3/uL (ref 0.0–0.5)
Eosinophils Relative: 1 %
HCT: 36.7 % (ref 36.0–46.0)
Hemoglobin: 12.5 g/dL (ref 12.0–15.0)
Immature Granulocytes: 1 %
Lymphocytes Relative: 15 %
Lymphs Abs: 1.4 10*3/uL (ref 0.7–4.0)
MCH: 32.5 pg (ref 26.0–34.0)
MCHC: 34.1 g/dL (ref 30.0–36.0)
MCV: 95.3 fL (ref 80.0–100.0)
Monocytes Absolute: 0.6 10*3/uL (ref 0.1–1.0)
Monocytes Relative: 6 %
Neutro Abs: 7.2 10*3/uL (ref 1.7–7.7)
Neutrophils Relative %: 76 %
Platelets: 241 10*3/uL (ref 150–400)
RBC: 3.85 MIL/uL — ABNORMAL LOW (ref 3.87–5.11)
RDW: 12.1 % (ref 11.5–15.5)
WBC: 9.5 10*3/uL (ref 4.0–10.5)
nRBC: 0 % (ref 0.0–0.2)

## 2020-06-26 LAB — COMPREHENSIVE METABOLIC PANEL
ALT: 16 U/L (ref 0–44)
AST: 20 U/L (ref 15–41)
Albumin: 2.7 g/dL — ABNORMAL LOW (ref 3.5–5.0)
Alkaline Phosphatase: 59 U/L (ref 38–126)
Anion gap: 13 (ref 5–15)
BUN: 23 mg/dL (ref 8–23)
CO2: 27 mmol/L (ref 22–32)
Calcium: 8.8 mg/dL — ABNORMAL LOW (ref 8.9–10.3)
Chloride: 99 mmol/L (ref 98–111)
Creatinine, Ser: 1.76 mg/dL — ABNORMAL HIGH (ref 0.44–1.00)
GFR calc Af Amer: 27 mL/min — ABNORMAL LOW (ref >60)
GFR calc non Af Amer: 24 mL/min — ABNORMAL LOW (ref >60)
Glucose, Bld: 254 mg/dL — ABNORMAL HIGH (ref 70–99)
Potassium: 3 mmol/L — ABNORMAL LOW (ref 3.5–5.1)
Sodium: 139 mmol/L (ref 135–145)
Total Bilirubin: 0.7 mg/dL (ref 0.3–1.2)
Total Protein: 5.6 g/dL — ABNORMAL LOW (ref 6.5–8.1)

## 2020-06-26 LAB — CBG MONITORING, ED: Glucose-Capillary: 229 mg/dL — ABNORMAL HIGH (ref 70–99)

## 2020-06-26 LAB — MAGNESIUM: Magnesium: 1.6 mg/dL — ABNORMAL LOW (ref 1.7–2.4)

## 2020-06-26 MED ORDER — MAGNESIUM 30 MG PO TABS
30.0000 mg | ORAL_TABLET | Freq: Every day | ORAL | 0 refills | Status: DC
Start: 2020-06-26 — End: 2023-08-01

## 2020-06-26 MED ORDER — SODIUM CHLORIDE 0.9 % IV SOLN: INTRAVENOUS | Status: DC

## 2020-06-26 MED ORDER — POTASSIUM CHLORIDE ER 10 MEQ PO TBCR
10.0000 meq | EXTENDED_RELEASE_TABLET | Freq: Every day | ORAL | 0 refills | Status: AC
Start: 2020-06-26 — End: 2020-07-26

## 2020-06-26 NOTE — Discharge Instructions (Signed)
[name] was evaluated in the ED today for syncope.  Her labs were reassuring though her potassium and magnesium were slightly low.  Please take 1 tablet of potassium as well as 1 tablet of magnesium once a day and please follow-up with your primary care physician to recheck these values.

## 2020-06-26 NOTE — ED Triage Notes (Signed)
To ED via [facility] from [facility] , had a syncopal episode while being assisted to bathroom by staff-- did not hit head, is alert/oriented x 4, does not remember what happened.   Bedbound or wheelchair.

## 2020-06-26 NOTE — ED Provider Notes (Addendum)
[facility] EMERGENCY DEPARTMENT Provider Note   CSN: [id number] Arrival date & time: 06/26/20  1022     History Chief Complaint  Patient presents with  . Fall  . Loss of Consciousness    [name] is a 86 y.o. female.  The history is provided by the patient, the EMS personnel and a relative.  Loss of Consciousness Episode history:  Single Most recent episode:  Today Timing:  Intermittent Progression:  Unchanged Chronicity:  New Witnessed: yes   Relieved by:  Nothing Worsened by:  Nothing Ineffective treatments:  None tried Associated symptoms: no chest pain, no fever, no palpitations, no seizures, no shortness of breath and no vomiting   Risk factors: no congenital heart disease, no coronary artery disease and no vascular disease    86 year old female with recent recurrent syncopal episodes, diabetes, dementia presents with syncope.  Syncopal episode was witnessed by her daughter.  Patient was ambulating to the bathroom when she had a syncopal episode.  Staff at facility witnessed the episode and stated that patient did not hit her head and denied any other trauma.  Patient is reportedly asymptomatic prior to the fall.  Patient states that she does not remember falling however she is currently denying any pain including headache, neck pain, chest pain, abdominal pain, hip pain.  Patient does not remember any of the preceding events or events after her syncopal episode.  Of note patient was recently admitted to the hospital due to recurrent syncope where she had a thorough work-up that did not reveal the etiology of her syncope.  Patient has been having diarrhea for the past 3 weeks according to her daughter.  Past Medical History:  Diagnosis Date  . Anxiety    loss of child 1 year ago  . Cancer ([place])    right breast  . Diabetes mellitus, type 2 ([place])   . DNR (do not resuscitate) discussion 05/29/2020  . Glaucoma   . Gout   . Hyperlipidemia   .  Hypertension    states eccho Dr Mare Ferrari 1 year ago- no record found in [facility] or office records  . Hyperthyroidism     Patient Active Problem List   Diagnosis Date Noted  . Postural dizziness with presyncope 05/30/2020  . Unresponsive 05/29/2020  . Altered mental status 05/29/2020  . DNR (do not resuscitate) discussion 05/29/2020  . Dementia with behavioral disturbance ([place]) 12/01/2019  . 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 Open wound of arm with complication, right, initial encounter 08/13/2019  . Need for influenza vaccination 08/13/2019  . Hypochloremia 07/13/2019  . Syncope 06/24/2019  . Hyponatremia   . Pain due to onychomycosis of toenails of both feet 05/18/2019  . Diabetic neuropathy ([place]) 05/18/2019  . Macrocytic anemia 12/08/2018  . Dehydration 08/06/2018  . Hospital discharge follow-up 08/06/2018  . Abnormal liver function   . Acute lower UTI 07/31/2018  . Sepsis ([place]) 07/31/2018  . Hypokalemia 07/31/2018  . AKI (acute kidney injury) ([place]) 07/31/2018  . CKD (chronic kidney disease) stage 3, GFR 30-59 ml/min 07/09/2018  . Urinary frequency 04/14/2018  . Hyperlipidemia 04/08/2017  . Right knee pain 12/10/2016  . Osteoarthritis 04/23/2016  . Peripheral neuropathy 04/23/2016  . Joint pain, hip 06/22/2015  . Skin lesion of face 06/10/2013  . General medical examination 10/30/2011  . Cancer of overlapping sites of right female breast ([place]) 10/09/2011  . Hypothyroidism 12/13/2010  . Diabetes mellitus type 2, noninsulin dependent ([place]) 12/13/2010  . Gout 12/13/2010  .  ANXIETY 12/13/2010  . Essential hypertension 12/13/2010    Past Surgical History:  Procedure Laterality Date  . ABDOMINAL HYSTERECTOMY  1995  . BREAST LUMPECTOMY  11/06/2011   Procedure: LUMPECTOMY;  Surgeon: Harl Bowie, MD;  Location: [facility];  Service: General;  Laterality: Right;  . CHOLECYSTECTOMY  1976  . [place]   right     OB History   No obstetric history on file.       Family History  Problem Relation Age of Onset  . Cancer Mother        bladder  . Kidney disease Mother   . Heart attack Brother   . Stroke Father   . Hypertension Sister   . Hyperlipidemia Daughter   . Diabetes Sister     Social History   Tobacco Use  . Smoking status: Never Smoker  . Smokeless tobacco: Never Used  Substance Use Topics  . Alcohol use: No  . Drug use: No    Home Medications Prior to Admission medications   Medication Sig Start Date End Date Taking? Authorizing Provider  acebutolol (SECTRAL) 200 MG capsule Take 1 capsule (200 mg total) by mouth at bedtime. 10/20/19  Yes [name], MD  acetaminophen (TYLENOL) 500 MG tablet Take 500 mg by mouth in the morning, at noon, and at bedtime.    Yes [provider]  amLODipine (NORVASC) 10 MG tablet Take 1 tablet (10 mg total) by mouth every morning. Patient taking differently: Take 10 mg by mouth daily.  06/09/20  Yes Mikhail, Velta Addison, DO  atorvastatin (LIPITOR) 10 MG tablet Take 1 tablet (10 mg total) by mouth daily. 10/20/19  Yes [name], MD  cholecalciferol (VITAMIN D3) 25 MCG (1000 UNIT) tablet Take 1,000 Units by mouth daily.   Yes [provider]  docusate sodium (COLACE) 100 MG capsule Take 1 capsule (100 mg total) by mouth 2 (two) times daily. 06/08/20  Yes Mikhail, [place], DO  hydroxypropyl methylcellulose / hypromellose (ISOPTO TEARS / GONIOVISC) 2.5 % ophthalmic solution Place 1 drop into both eyes 3 (three) times daily as needed for dry eyes.   Yes [provider]  levETIRAcetam (KEPPRA) 500 MG tablet Take 1 tablet (500 mg total) by mouth 2 (two) times daily. 06/08/20  Yes Mikhail, Velta Addison, DO  levothyroxine (SYNTHROID) 100 MCG tablet Take 1 tablet (100 mcg total) by mouth daily before breakfast. 10/20/19  Yes [name], MD  loperamide (IMODIUM) 2 MG capsule Take 2 capsules (4 mg total) by mouth as needed for diarrhea or loose stools. 06/08/20   Yes Mikhail, [place], DO  Multiple Vitamins-Minerals (PRESERVISION AREDS 2+MULTI VIT PO) Take 1 capsule by mouth in the morning and at bedtime.   Yes [provider]  Nutritional Supplements (FEEDING SUPPLEMENT, GLUCERNA 1.2 CAL,) LIQD Place 1,000 mLs into feeding tube 2 (two) times daily as needed (to support calorie malnutrition).   Yes [provider]  polyethylene glycol (MIRALAX / GLYCOLAX) 17 g packet Take 17 g by mouth daily as needed for moderate constipation or severe constipation. 06/08/20  Yes Mikhail, Velta Addison, DO  potassium chloride (KLOR-CON) 10 MEQ tablet Take 10 mEq by mouth 2 (two) times daily. x7ds   Yes [provider]  senna-docusate (SENOKOT-S) 8.6-50 MG tablet Take 2 tablets by mouth at bedtime as needed for mild constipation or moderate constipation. 06/08/20  Yes Mikhail, Maryann, DO  sitaGLIPtin (JANUVIA) 50 MG tablet Take 1 tablet (50 mg total) by mouth every morning.  10/20/19  Yes [name], MD  glucose blood (TRUETRACK TEST) test strip Use as instructed to check blood sugar once a day.  Dx E11.9 Patient taking differently: 1 each by Other route See admin instructions. Use as instructed to check blood sugar once a day.  Dx E11.9 12/29/18   [name], MD  magnesium 30 MG tablet Take 1 tablet (30 mg total) by mouth daily. 06/26/20   Silvestre Gunner, MD  potassium chloride (KLOR-CON) 10 MEQ tablet Take 1 tablet (10 mEq total) by mouth daily. 06/26/20 07/26/20  Silvestre Gunner, MD  TRUEPLUS LANCETS 33G MISC Use to check blood sugar once a day. Dx E11.9 Patient taking differently: 1 each by Other route See admin instructions. Use to check blood sugar once a day. Dx E11.9 12/29/18   [name], MD  glimepiride (AMARYL) 1 MG tablet Take 1 tablet (1 mg total) by mouth every morning. Patient not taking: Reported on 02/02/2020 10/20/19 02/03/20  [name], MD    Allergies    Ativan [lorazepam]  Review of Systems    Review of Systems  Constitutional: Negative for chills and fever.  HENT: Negative for ear pain and sore throat.   Eyes: Negative for pain and visual disturbance.  Respiratory: Negative for cough and shortness of breath.   Cardiovascular: Positive for syncope. Negative for chest pain and palpitations.  Gastrointestinal: Negative for abdominal pain and vomiting.  Genitourinary: Negative for dysuria and hematuria.  Musculoskeletal: Negative for arthralgias and back pain.  Skin: Negative for color change and rash.  Neurological: Positive for syncope. Negative for seizures.  All other systems reviewed and are negative.   Physical Exam Updated Vital Signs There were no vitals taken for this visit.  Physical Exam Vitals and nursing note reviewed.  Constitutional:      General: She is not in acute distress.    Appearance: She is well-developed.  HENT:     Head: Normocephalic and atraumatic.     Comments: Healing ecchymosis to L side of face. Eyes:     Conjunctiva/sclera: Conjunctivae normal.  Cardiovascular:     Rate and Rhythm: Normal rate and regular rhythm.     Heart sounds: No murmur heard.   Pulmonary:     Effort: Pulmonary effort is normal. No respiratory distress.     Breath sounds: Normal breath sounds.  Abdominal:     Palpations: Abdomen is soft.     Tenderness: There is no abdominal tenderness.  Musculoskeletal:     Cervical back: Neck supple.  Skin:    General: Skin is warm and dry.  Neurological:     Mental Status: She is alert.     Comments: Oriented to self and place but not time.  Strength 5/5 in all 4 extremities.  No facial droop.  No sensory deficits.  No slurred speech.       ED Results / Procedures / Treatments   Labs (all labs ordered are listed, but only abnormal results are displayed) Labs Reviewed  COMPREHENSIVE METABOLIC PANEL - Abnormal; Notable for the following components:      Result Value   Potassium 3.0 (*)    Glucose, Bld 254 (*)     Creatinine, Ser 1.76 (*)    Calcium 8.8 (*)    Total Protein 5.6 (*)    Albumin 2.7 (*)    GFR calc non Af Amer 24 (*)    GFR calc Af Amer 27 (*)    All other components within  normal limits  CBC WITH DIFFERENTIAL/PLATELET - Abnormal; Notable for the following components:   RBC 3.85 (*)    All other components within normal limits  MAGNESIUM - Abnormal; Notable for the following components:   Magnesium 1.6 (*)    All other components within normal limits  CBG MONITORING, ED - Abnormal; Notable for the following components:   Glucose-Capillary 229 (*)    All other components within normal limits    EKG None  Radiology DG Chest Port 1 View  Result Date: 06/26/2020 CLINICAL DATA:  Syncope with transient loss of memory EXAM: PORTABLE CHEST 1 VIEW COMPARISON:  May 29, 2020 FINDINGS: There is a small left pleural effusion. Lungs elsewhere clear. Heart is mildly enlarged with pulmonary vascularity normal. No adenopathy. There is aortic atherosclerosis as well as foci of right innominate artery calcification. There is a skin fold on the left. No pneumothorax. No bone lesions. IMPRESSION: Small left pleural effusion. No edema or airspace opacity. Stable cardiac prominence. Aortic Atherosclerosis (ICD10-I70.0). Electronically Signed   By: Lowella Grip III M.D.   On: 06/26/2020 10:57    Procedures Procedures (including critical care time)  Medications Ordered in ED Medications  0.9 %  sodium chloride infusion (has no administration in time range)    ED Course  I have reviewed the triage vital signs and the nursing notes.  Pertinent labs & imaging results that were available during my care of the patient were reviewed by me and considered in my medical decision making (see chart for details).    MDM Rules/Calculators/A&P                          86 year old female presents with syncope.  Afebrile vital signs stable.  Exam as above.  No focal neuro deficits or signs of new trauma  on physical exam.  Will obtain screening labs including CBC, BMP, EKG and reassess patient.  Patient is currently at her baseline per her daughter who is at bedside. Potassium 3.0, magnesium 1.6. Remainder of CMP, CBC unremarkable.    Upon chart review pt was admitted for recurrent syncope and was discharged on 7/22.  W/u for diarrhea at that time also negative. Given patient's recent admission for recurrent syncope as well as diarrhea with negative work-up (MRI. EEG, Echo) as well as similar symptomatology today do not feel the patient requires further labs or imaging at this time. Patient is currently back at her baseline and appears at this was another episode of her recurrent syncope. Patient will likely need higher skilled nursing when she graduates from rehab. This was discussed with patient's daughter at bedside who stated that she had a meeting with the rehab staff tomorrow to find permanent placement for her mother. I feel that this is reasonable for the patient and do not feel that she would benefit from another admission to the hospital for recurrent syncope. I discussed this with the patient's daughter who voiced agreement and understanding of this plan. Patient will be discharged on potassium and magnesium due to her low numbers as well as higher probability of her having low electrolytes given her chronic diarrhea. Patient was discharged in stable condition without further events.   Final Clinical Impression(s) / ED Diagnoses Final diagnoses:  Hypokalemia  Syncope, unspecified syncope type    Rx / DC Orders ED Discharge Orders         Ordered    magnesium 30 MG tablet  Daily  Discontinue  Reprint     06/26/20 1217    potassium chloride (KLOR-CON) 10 MEQ tablet  Daily     Discontinue  Reprint     06/26/20 1217           Silvestre Gunner, MD 06/26/20 1231    Carmin Muskrat, MD 06/27/20 [phone]

## 2020-06-27 DIAGNOSIS — R4182 Altered mental status, unspecified: Secondary | ICD-10-CM | POA: Diagnosis not present

## 2020-06-27 DIAGNOSIS — F0390 Unspecified dementia without behavioral disturbance: Secondary | ICD-10-CM | POA: Diagnosis not present

## 2020-06-27 DIAGNOSIS — R55 Syncope and collapse: Secondary | ICD-10-CM | POA: Diagnosis not present

## 2020-06-27 DIAGNOSIS — W19XXXA Unspecified fall, initial encounter: Secondary | ICD-10-CM | POA: Diagnosis not present

## 2020-06-27 DIAGNOSIS — R5383 Other fatigue: Secondary | ICD-10-CM | POA: Diagnosis not present

## 2020-06-27 DIAGNOSIS — G4089 Other seizures: Secondary | ICD-10-CM | POA: Diagnosis not present

## 2020-06-27 DIAGNOSIS — E1140 Type 2 diabetes mellitus with diabetic neuropathy, unspecified: Secondary | ICD-10-CM | POA: Diagnosis not present

## 2020-06-27 IMAGING — CR CHEST - 2 VIEW
2 series · 2 of 2 positions shown · non-contrast
Comparison: None.

CLINICAL DATA: Fall weakness in lower extremities

EXAM:
CHEST - 2 VIEW

[x chest ap]
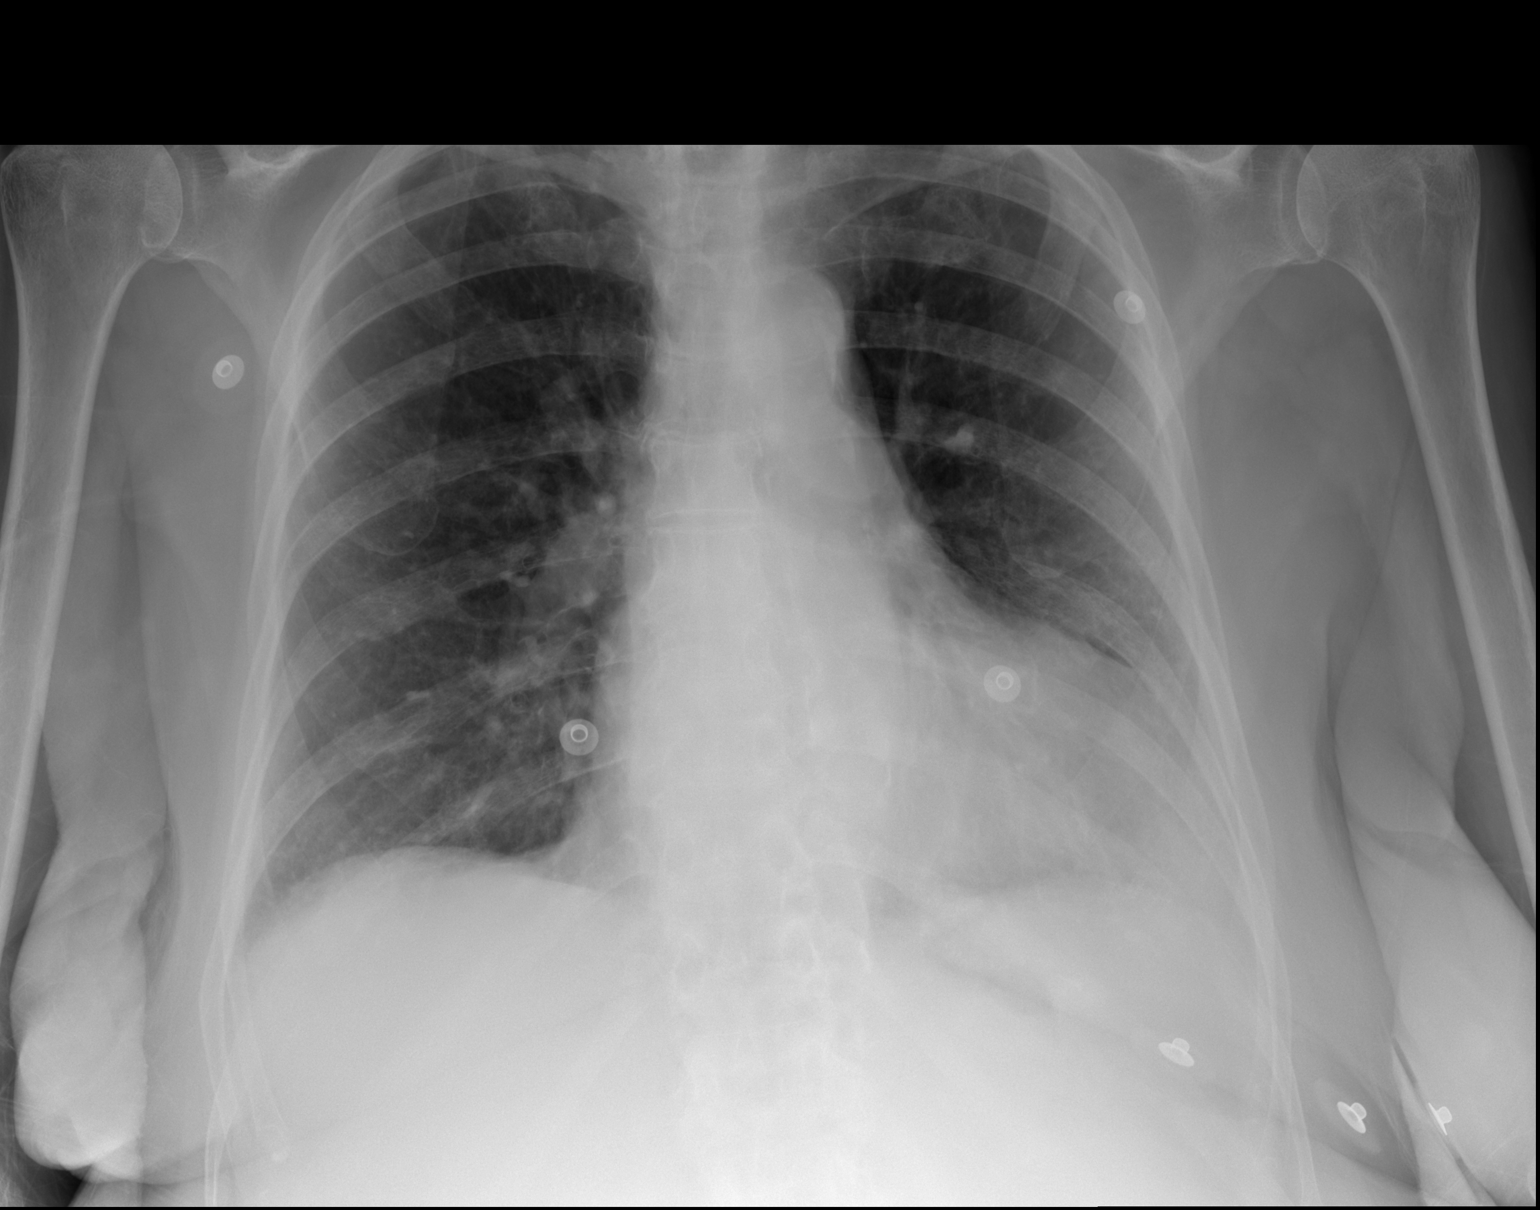

[w chest lat]
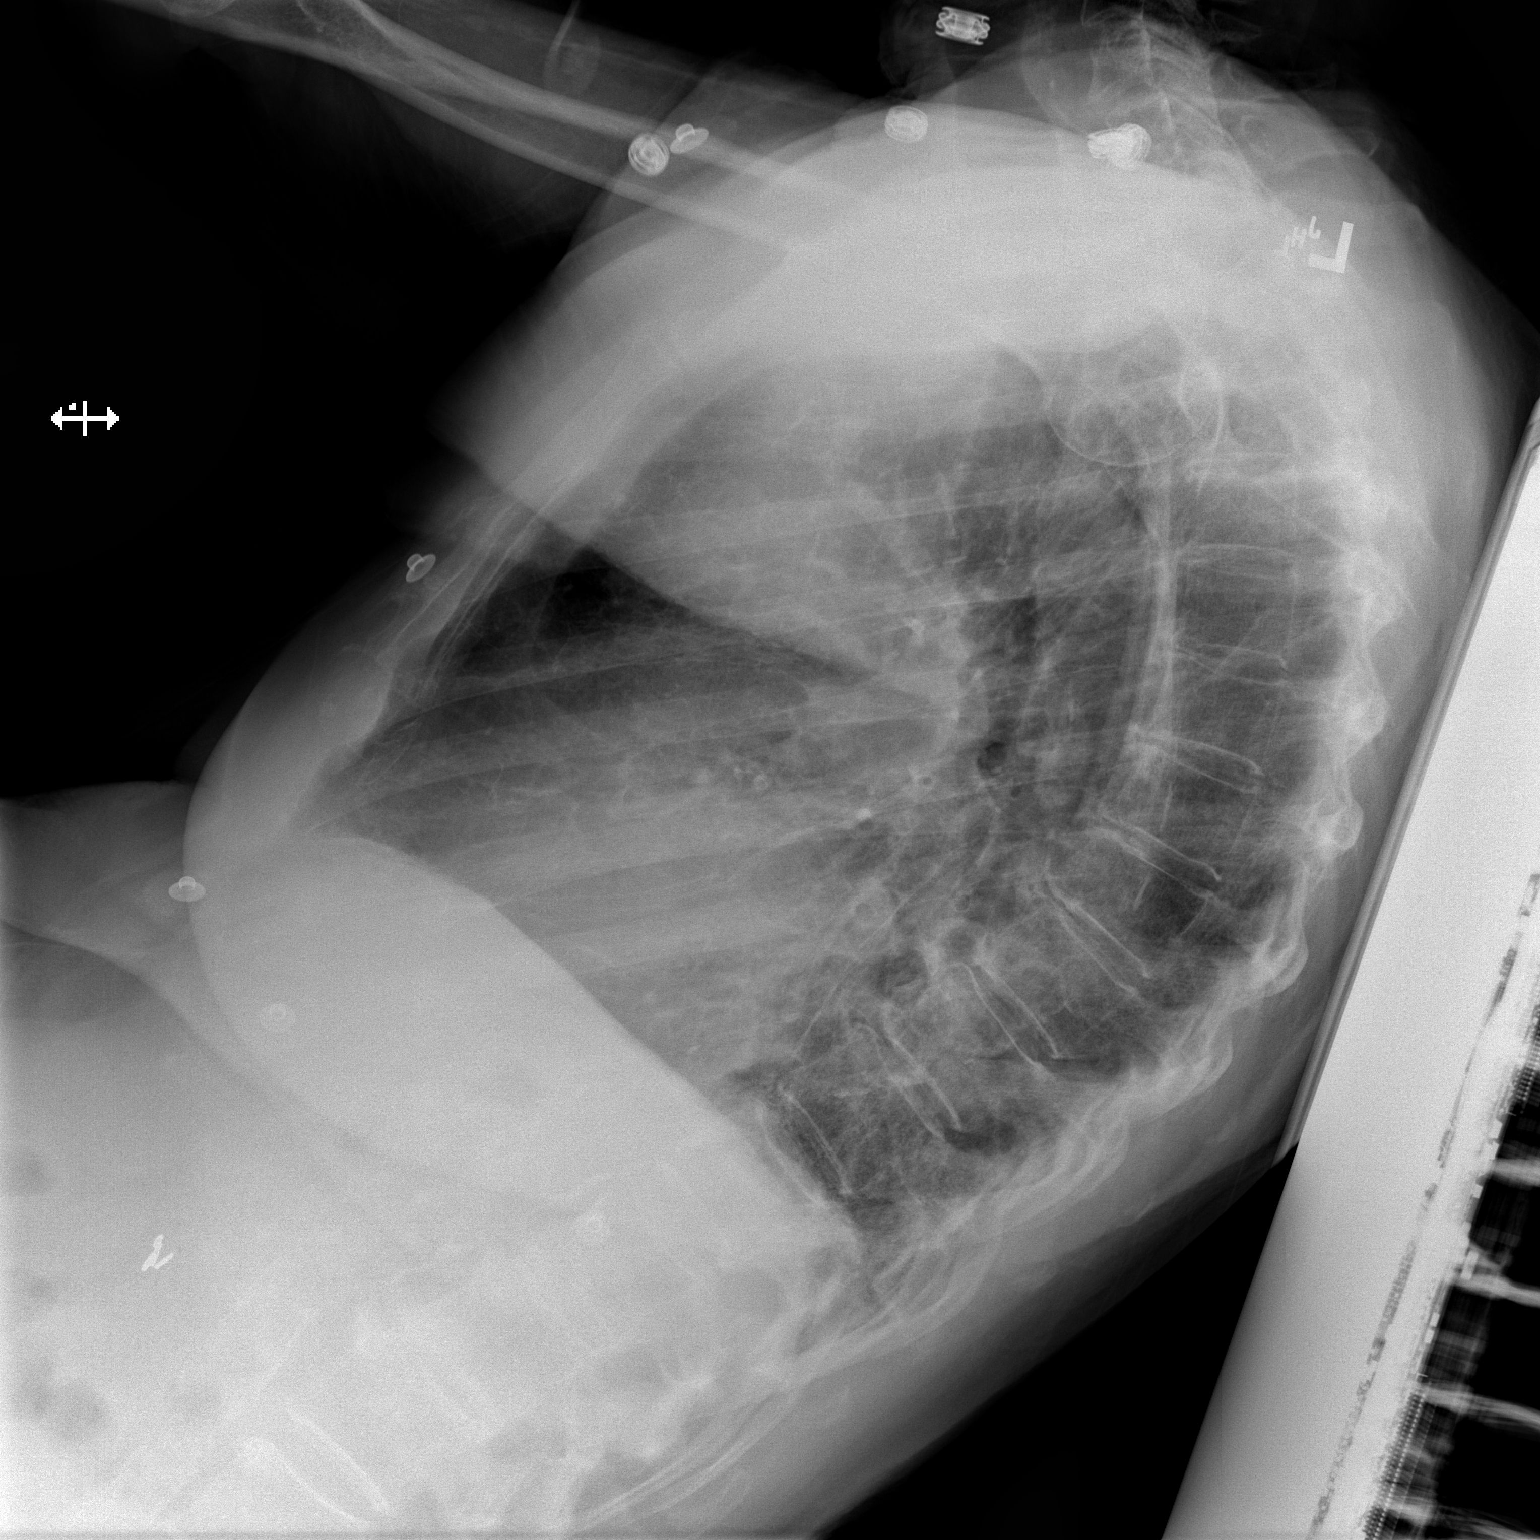

[2 of 2 positions shown; findings below may reference images not displayed]

FINDINGS: The lungs are clear without focal consolidation, edema, effusion or
pneumothorax. There is mild cardiomegaly. There is atherosclerotic
calcification the aortic knob with a tortuous descending aorta.
Degenerative changes seen in the midthoracic spine. No acute osseous
findings. Surgical clips in the upper quadrant.
IMPRESSION: No acute cardiopulmonary process.

## 2020-06-28 ENCOUNTER — Other Ambulatory Visit: Payer: Self-pay | Admitting: *Deleted

## 2020-06-28 ENCOUNTER — Ambulatory Visit: Payer: Medicare Other | Admitting: Podiatry

## 2020-06-28 NOTE — Patient Outreach (Signed)
Member screened for potential [facility] Care Management needs as a benefit of [place] Medicare.  Verified with [facility] Admissions Coordinator that member is residing in [place].   Communication sent to facility SW to inquire about transition plans and to make aware writer is following for potential [facility] Care Management services.   Marthenia Rolling, MSN-Ed, RN,BSN [place] Acute Care Coordinator [phone] [facility]) [phone]  (Toll free office)

## 2020-07-07 ENCOUNTER — Other Ambulatory Visit: Payer: Self-pay | Admitting: *Deleted

## 2020-07-07 NOTE — Patient Outreach (Signed)
[facility] Post- Acute Care Coordinator follow up. Member screened for potential [facility] Care Management needs as a benefit of [place] Medicare.  Update received from [place] indicating family is looking into ALF vs LTC placement at [place].  Will continue to follow for transition plans while member resides in SNF.    Marthenia Rolling, MSN-Ed, RN,BSN [place] Acute Care Coordinator [phone] [facility]) [phone]  (Toll free office)

## 2020-07-17 DIAGNOSIS — R55 Syncope and collapse: Secondary | ICD-10-CM | POA: Diagnosis not present

## 2020-07-17 DIAGNOSIS — R4182 Altered mental status, unspecified: Secondary | ICD-10-CM | POA: Diagnosis not present

## 2020-07-17 DIAGNOSIS — F0390 Unspecified dementia without behavioral disturbance: Secondary | ICD-10-CM | POA: Diagnosis not present

## 2020-07-17 DIAGNOSIS — R627 Adult failure to thrive: Secondary | ICD-10-CM | POA: Diagnosis not present

## 2020-07-17 DIAGNOSIS — R5383 Other fatigue: Secondary | ICD-10-CM | POA: Diagnosis not present

## 2020-07-17 DIAGNOSIS — R54 Age-related physical debility: Secondary | ICD-10-CM | POA: Diagnosis not present

## 2020-07-18 ENCOUNTER — Other Ambulatory Visit: Payer: Self-pay | Admitting: *Deleted

## 2020-07-18 NOTE — Patient Outreach (Signed)
[facility]  Post- Acute Care Coordinator follow up. Member screened for potential [facility] Care Management needs as a benefit of [place] Medicare.  Communication sent to [facility] SN[facility] SW to request an update.  Will continue to follow.   Marthenia Rolling, MSN-Ed, RN,BSN [place] Acute Care Coordinator [phone] [facility]) [phone]  (Toll free office)

## 2020-07-19 ENCOUNTER — Other Ambulatory Visit: Payer: Self-pay | Admitting: *Deleted

## 2020-07-19 DIAGNOSIS — G4089 Other seizures: Secondary | ICD-10-CM | POA: Diagnosis not present

## 2020-07-19 DIAGNOSIS — M6281 Muscle weakness (generalized): Secondary | ICD-10-CM | POA: Diagnosis not present

## 2020-07-19 DIAGNOSIS — I10 Essential (primary) hypertension: Secondary | ICD-10-CM | POA: Diagnosis not present

## 2020-07-19 DIAGNOSIS — E43 Unspecified severe protein-calorie malnutrition: Secondary | ICD-10-CM | POA: Diagnosis not present

## 2020-07-19 DIAGNOSIS — E1140 Type 2 diabetes mellitus with diabetic neuropathy, unspecified: Secondary | ICD-10-CM | POA: Diagnosis not present

## 2020-07-19 DIAGNOSIS — N1831 Chronic kidney disease, stage 3a: Secondary | ICD-10-CM | POA: Diagnosis not present

## 2020-07-19 DIAGNOSIS — R4182 Altered mental status, unspecified: Secondary | ICD-10-CM | POA: Diagnosis not present

## 2020-07-19 DIAGNOSIS — F0390 Unspecified dementia without behavioral disturbance: Secondary | ICD-10-CM | POA: Diagnosis not present

## 2020-07-19 DIAGNOSIS — E039 Hypothyroidism, unspecified: Secondary | ICD-10-CM | POA: Diagnosis not present

## 2020-07-19 DIAGNOSIS — G9341 Metabolic encephalopathy: Secondary | ICD-10-CM | POA: Diagnosis not present

## 2020-07-19 NOTE — Patient Outreach (Signed)
[facility] Post- Acute Care Coordinator follow up.   Update received from [place] on 07/18/20 indicating member will transition to [facility] ALF on 07/19/20.   No identifiable [facility] Care Management needs at this time.    Marthenia Rolling, MSN-Ed, RN,BSN [place] Acute Care Coordinator [phone] [facility]) [phone]  (Toll free office)

## 2020-07-27 DIAGNOSIS — G40909 Epilepsy, unspecified, not intractable, without status epilepticus: Secondary | ICD-10-CM | POA: Diagnosis not present

## 2020-07-27 DIAGNOSIS — E119 Type 2 diabetes mellitus without complications: Secondary | ICD-10-CM | POA: Diagnosis not present

## 2020-07-27 DIAGNOSIS — R197 Diarrhea, unspecified: Secondary | ICD-10-CM | POA: Diagnosis not present

## 2020-07-27 DIAGNOSIS — I10 Essential (primary) hypertension: Secondary | ICD-10-CM | POA: Diagnosis not present

## 2020-07-28 DIAGNOSIS — E1122 Type 2 diabetes mellitus with diabetic chronic kidney disease: Secondary | ICD-10-CM | POA: Diagnosis not present

## 2020-07-28 DIAGNOSIS — F0281 Dementia in other diseases classified elsewhere with behavioral disturbance: Secondary | ICD-10-CM | POA: Diagnosis not present

## 2020-07-28 DIAGNOSIS — Z853 Personal history of malignant neoplasm of breast: Secondary | ICD-10-CM | POA: Diagnosis not present

## 2020-07-28 DIAGNOSIS — G9341 Metabolic encephalopathy: Secondary | ICD-10-CM | POA: Diagnosis not present

## 2020-07-28 DIAGNOSIS — D320 Benign neoplasm of cerebral meninges: Secondary | ICD-10-CM | POA: Diagnosis not present

## 2020-07-28 DIAGNOSIS — Z9181 History of falling: Secondary | ICD-10-CM | POA: Diagnosis not present

## 2020-07-28 DIAGNOSIS — N1832 Chronic kidney disease, stage 3b: Secondary | ICD-10-CM | POA: Diagnosis not present

## 2020-07-28 DIAGNOSIS — E039 Hypothyroidism, unspecified: Secondary | ICD-10-CM | POA: Diagnosis not present

## 2020-07-28 DIAGNOSIS — E43 Unspecified severe protein-calorie malnutrition: Secondary | ICD-10-CM | POA: Diagnosis not present

## 2020-07-28 DIAGNOSIS — G4089 Other seizures: Secondary | ICD-10-CM | POA: Diagnosis not present

## 2020-07-28 DIAGNOSIS — M109 Gout, unspecified: Secondary | ICD-10-CM | POA: Diagnosis not present

## 2020-07-28 DIAGNOSIS — H409 Unspecified glaucoma: Secondary | ICD-10-CM | POA: Diagnosis not present

## 2020-07-28 DIAGNOSIS — E785 Hyperlipidemia, unspecified: Secondary | ICD-10-CM | POA: Diagnosis not present

## 2020-07-28 DIAGNOSIS — F419 Anxiety disorder, unspecified: Secondary | ICD-10-CM | POA: Diagnosis not present

## 2020-07-28 DIAGNOSIS — Z7984 Long term (current) use of oral hypoglycemic drugs: Secondary | ICD-10-CM | POA: Diagnosis not present

## 2020-07-28 DIAGNOSIS — E1142 Type 2 diabetes mellitus with diabetic polyneuropathy: Secondary | ICD-10-CM | POA: Diagnosis not present

## 2020-07-28 DIAGNOSIS — E1140 Type 2 diabetes mellitus with diabetic neuropathy, unspecified: Secondary | ICD-10-CM | POA: Diagnosis not present

## 2020-07-28 DIAGNOSIS — I129 Hypertensive chronic kidney disease with stage 1 through stage 4 chronic kidney disease, or unspecified chronic kidney disease: Secondary | ICD-10-CM | POA: Diagnosis not present

## 2020-08-01 DIAGNOSIS — F0391 Unspecified dementia with behavioral disturbance: Secondary | ICD-10-CM | POA: Diagnosis not present

## 2020-08-01 DIAGNOSIS — G9341 Metabolic encephalopathy: Secondary | ICD-10-CM | POA: Diagnosis not present

## 2020-08-01 DIAGNOSIS — R278 Other lack of coordination: Secondary | ICD-10-CM | POA: Diagnosis not present

## 2020-08-02 DIAGNOSIS — E1142 Type 2 diabetes mellitus with diabetic polyneuropathy: Secondary | ICD-10-CM | POA: Diagnosis not present

## 2020-08-02 DIAGNOSIS — N1832 Chronic kidney disease, stage 3b: Secondary | ICD-10-CM | POA: Diagnosis not present

## 2020-08-02 DIAGNOSIS — G4089 Other seizures: Secondary | ICD-10-CM | POA: Diagnosis not present

## 2020-08-02 DIAGNOSIS — G9341 Metabolic encephalopathy: Secondary | ICD-10-CM | POA: Diagnosis not present

## 2020-08-02 DIAGNOSIS — I129 Hypertensive chronic kidney disease with stage 1 through stage 4 chronic kidney disease, or unspecified chronic kidney disease: Secondary | ICD-10-CM | POA: Diagnosis not present

## 2020-08-02 DIAGNOSIS — E1122 Type 2 diabetes mellitus with diabetic chronic kidney disease: Secondary | ICD-10-CM | POA: Diagnosis not present

## 2020-08-08 ENCOUNTER — Ambulatory Visit: Payer: Medicare Other | Admitting: Family Medicine

## 2020-08-08 DIAGNOSIS — M6281 Muscle weakness (generalized): Secondary | ICD-10-CM | POA: Diagnosis not present

## 2020-08-08 DIAGNOSIS — R2689 Other abnormalities of gait and mobility: Secondary | ICD-10-CM | POA: Diagnosis not present

## 2020-08-09 DIAGNOSIS — R278 Other lack of coordination: Secondary | ICD-10-CM | POA: Diagnosis not present

## 2020-08-09 DIAGNOSIS — F0391 Unspecified dementia with behavioral disturbance: Secondary | ICD-10-CM | POA: Diagnosis not present

## 2020-08-09 DIAGNOSIS — G9341 Metabolic encephalopathy: Secondary | ICD-10-CM | POA: Diagnosis not present

## 2020-08-10 DIAGNOSIS — F0391 Unspecified dementia with behavioral disturbance: Secondary | ICD-10-CM | POA: Diagnosis not present

## 2020-08-10 DIAGNOSIS — G9341 Metabolic encephalopathy: Secondary | ICD-10-CM | POA: Diagnosis not present

## 2020-08-10 DIAGNOSIS — R278 Other lack of coordination: Secondary | ICD-10-CM | POA: Diagnosis not present

## 2020-08-11 ENCOUNTER — Ambulatory Visit: Payer: Medicare Other | Admitting: Family Medicine

## 2020-08-11 DIAGNOSIS — R2689 Other abnormalities of gait and mobility: Secondary | ICD-10-CM | POA: Diagnosis not present

## 2020-08-11 DIAGNOSIS — M6281 Muscle weakness (generalized): Secondary | ICD-10-CM | POA: Diagnosis not present

## 2020-08-15 DIAGNOSIS — R2689 Other abnormalities of gait and mobility: Secondary | ICD-10-CM | POA: Diagnosis not present

## 2020-08-15 DIAGNOSIS — M6281 Muscle weakness (generalized): Secondary | ICD-10-CM | POA: Diagnosis not present

## 2020-08-16 DIAGNOSIS — G9341 Metabolic encephalopathy: Secondary | ICD-10-CM | POA: Diagnosis not present

## 2020-08-16 DIAGNOSIS — R278 Other lack of coordination: Secondary | ICD-10-CM | POA: Diagnosis not present

## 2020-08-16 DIAGNOSIS — F0391 Unspecified dementia with behavioral disturbance: Secondary | ICD-10-CM | POA: Diagnosis not present

## 2020-08-17 DIAGNOSIS — M6281 Muscle weakness (generalized): Secondary | ICD-10-CM | POA: Diagnosis not present

## 2020-08-17 DIAGNOSIS — R2689 Other abnormalities of gait and mobility: Secondary | ICD-10-CM | POA: Diagnosis not present

## 2020-08-17 DIAGNOSIS — R2681 Unsteadiness on feet: Secondary | ICD-10-CM | POA: Diagnosis not present

## 2020-08-17 DIAGNOSIS — R197 Diarrhea, unspecified: Secondary | ICD-10-CM | POA: Diagnosis not present

## 2020-08-17 DIAGNOSIS — D229 Melanocytic nevi, unspecified: Secondary | ICD-10-CM | POA: Diagnosis not present

## 2020-08-18 DIAGNOSIS — G9341 Metabolic encephalopathy: Secondary | ICD-10-CM | POA: Diagnosis not present

## 2020-08-18 DIAGNOSIS — F0391 Unspecified dementia with behavioral disturbance: Secondary | ICD-10-CM | POA: Diagnosis not present

## 2020-08-18 DIAGNOSIS — R278 Other lack of coordination: Secondary | ICD-10-CM | POA: Diagnosis not present

## 2020-08-21 DIAGNOSIS — M6281 Muscle weakness (generalized): Secondary | ICD-10-CM | POA: Diagnosis not present

## 2020-08-21 DIAGNOSIS — R2689 Other abnormalities of gait and mobility: Secondary | ICD-10-CM | POA: Diagnosis not present

## 2020-08-22 DIAGNOSIS — G9341 Metabolic encephalopathy: Secondary | ICD-10-CM | POA: Diagnosis not present

## 2020-08-22 DIAGNOSIS — F0391 Unspecified dementia with behavioral disturbance: Secondary | ICD-10-CM | POA: Diagnosis not present

## 2020-08-22 DIAGNOSIS — R278 Other lack of coordination: Secondary | ICD-10-CM | POA: Diagnosis not present

## 2020-08-27 DIAGNOSIS — E1140 Type 2 diabetes mellitus with diabetic neuropathy, unspecified: Secondary | ICD-10-CM | POA: Diagnosis not present

## 2020-09-07 DIAGNOSIS — R197 Diarrhea, unspecified: Secondary | ICD-10-CM | POA: Diagnosis not present

## 2020-09-07 DIAGNOSIS — E119 Type 2 diabetes mellitus without complications: Secondary | ICD-10-CM | POA: Diagnosis not present

## 2020-09-07 DIAGNOSIS — E039 Hypothyroidism, unspecified: Secondary | ICD-10-CM | POA: Diagnosis not present

## 2020-09-07 DIAGNOSIS — G40909 Epilepsy, unspecified, not intractable, without status epilepticus: Secondary | ICD-10-CM | POA: Diagnosis not present

## 2020-09-07 DIAGNOSIS — I10 Essential (primary) hypertension: Secondary | ICD-10-CM | POA: Diagnosis not present

## 2020-09-13 DIAGNOSIS — Z23 Encounter for immunization: Secondary | ICD-10-CM | POA: Diagnosis not present

## 2020-09-13 DIAGNOSIS — C44722 Squamous cell carcinoma of skin of right lower limb, including hip: Secondary | ICD-10-CM | POA: Diagnosis not present

## 2020-09-28 DIAGNOSIS — B351 Tinea unguium: Secondary | ICD-10-CM | POA: Diagnosis not present

## 2020-09-28 DIAGNOSIS — M79675 Pain in left toe(s): Secondary | ICD-10-CM | POA: Diagnosis not present

## 2020-09-28 DIAGNOSIS — M79674 Pain in right toe(s): Secondary | ICD-10-CM | POA: Diagnosis not present

## 2020-09-29 DIAGNOSIS — E038 Other specified hypothyroidism: Secondary | ICD-10-CM | POA: Diagnosis not present

## 2020-09-29 DIAGNOSIS — Z79899 Other long term (current) drug therapy: Secondary | ICD-10-CM | POA: Diagnosis not present

## 2020-09-29 DIAGNOSIS — D518 Other vitamin B12 deficiency anemias: Secondary | ICD-10-CM | POA: Diagnosis not present

## 2020-09-29 DIAGNOSIS — E119 Type 2 diabetes mellitus without complications: Secondary | ICD-10-CM | POA: Diagnosis not present

## 2020-10-11 DIAGNOSIS — U071 COVID-19: Secondary | ICD-10-CM | POA: Diagnosis not present

## 2020-10-11 DIAGNOSIS — Z20828 Contact with and (suspected) exposure to other viral communicable diseases: Secondary | ICD-10-CM | POA: Diagnosis not present

## 2020-10-29 DIAGNOSIS — R410 Disorientation, unspecified: Secondary | ICD-10-CM | POA: Diagnosis not present

## 2020-10-29 DIAGNOSIS — Z66 Do not resuscitate: Secondary | ICD-10-CM | POA: Diagnosis present

## 2020-10-29 DIAGNOSIS — I10 Essential (primary) hypertension: Secondary | ICD-10-CM | POA: Diagnosis not present

## 2020-10-29 DIAGNOSIS — I959 Hypotension, unspecified: Secondary | ICD-10-CM | POA: Diagnosis not present

## 2020-10-29 DIAGNOSIS — I214 Non-ST elevation (NSTEMI) myocardial infarction: Secondary | ICD-10-CM | POA: Diagnosis not present

## 2020-10-29 DIAGNOSIS — E1122 Type 2 diabetes mellitus with diabetic chronic kidney disease: Secondary | ICD-10-CM | POA: Diagnosis present

## 2020-10-29 DIAGNOSIS — I493 Ventricular premature depolarization: Secondary | ICD-10-CM | POA: Diagnosis not present

## 2020-10-29 DIAGNOSIS — Z20822 Contact with and (suspected) exposure to covid-19: Secondary | ICD-10-CM | POA: Diagnosis not present

## 2020-10-29 DIAGNOSIS — Z743 Need for continuous supervision: Secondary | ICD-10-CM | POA: Diagnosis not present

## 2020-10-29 DIAGNOSIS — I129 Hypertensive chronic kidney disease with stage 1 through stage 4 chronic kidney disease, or unspecified chronic kidney disease: Secondary | ICD-10-CM | POA: Diagnosis not present

## 2020-10-29 DIAGNOSIS — I444 Left anterior fascicular block: Secondary | ICD-10-CM | POA: Diagnosis not present

## 2020-10-29 DIAGNOSIS — N390 Urinary tract infection, site not specified: Secondary | ICD-10-CM | POA: Diagnosis not present

## 2020-10-29 DIAGNOSIS — E785 Hyperlipidemia, unspecified: Secondary | ICD-10-CM | POA: Diagnosis present

## 2020-10-29 DIAGNOSIS — D649 Anemia, unspecified: Secondary | ICD-10-CM | POA: Diagnosis present

## 2020-10-29 DIAGNOSIS — N189 Chronic kidney disease, unspecified: Secondary | ICD-10-CM | POA: Diagnosis not present

## 2020-10-29 DIAGNOSIS — E039 Hypothyroidism, unspecified: Secondary | ICD-10-CM | POA: Diagnosis present

## 2020-10-29 DIAGNOSIS — R5381 Other malaise: Secondary | ICD-10-CM | POA: Diagnosis not present

## 2020-10-29 DIAGNOSIS — N183 Chronic kidney disease, stage 3 unspecified: Secondary | ICD-10-CM | POA: Diagnosis present

## 2020-10-29 DIAGNOSIS — R279 Unspecified lack of coordination: Secondary | ICD-10-CM | POA: Diagnosis not present

## 2020-10-29 DIAGNOSIS — G9341 Metabolic encephalopathy: Secondary | ICD-10-CM | POA: Diagnosis not present

## 2020-10-29 DIAGNOSIS — G9340 Encephalopathy, unspecified: Secondary | ICD-10-CM | POA: Diagnosis not present

## 2020-10-29 DIAGNOSIS — I517 Cardiomegaly: Secondary | ICD-10-CM | POA: Diagnosis not present

## 2020-10-29 DIAGNOSIS — I440 Atrioventricular block, first degree: Secondary | ICD-10-CM | POA: Diagnosis not present

## 2020-10-29 DIAGNOSIS — F0390 Unspecified dementia without behavioral disturbance: Secondary | ICD-10-CM | POA: Diagnosis present

## 2020-10-29 DIAGNOSIS — E876 Hypokalemia: Secondary | ICD-10-CM | POA: Diagnosis present

## 2020-10-30 DIAGNOSIS — I214 Non-ST elevation (NSTEMI) myocardial infarction: Secondary | ICD-10-CM | POA: Diagnosis not present

## 2020-11-02 DIAGNOSIS — R4020 Unspecified coma: Secondary | ICD-10-CM | POA: Diagnosis not present

## 2020-11-02 DIAGNOSIS — R404 Transient alteration of awareness: Secondary | ICD-10-CM | POA: Diagnosis not present

## 2020-11-02 DIAGNOSIS — R0689 Other abnormalities of breathing: Secondary | ICD-10-CM | POA: Diagnosis not present

## 2020-11-02 DIAGNOSIS — R0902 Hypoxemia: Secondary | ICD-10-CM | POA: Diagnosis not present

## 2020-11-02 DIAGNOSIS — I4430 Unspecified atrioventricular block: Secondary | ICD-10-CM | POA: Diagnosis not present

## 2020-11-07 DIAGNOSIS — E039 Hypothyroidism, unspecified: Secondary | ICD-10-CM | POA: Diagnosis not present

## 2020-11-07 DIAGNOSIS — E119 Type 2 diabetes mellitus without complications: Secondary | ICD-10-CM | POA: Diagnosis not present

## 2020-11-07 DIAGNOSIS — I10 Essential (primary) hypertension: Secondary | ICD-10-CM | POA: Diagnosis not present

## 2020-11-07 DIAGNOSIS — E785 Hyperlipidemia, unspecified: Secondary | ICD-10-CM | POA: Diagnosis not present

## 2020-11-07 DIAGNOSIS — I214 Non-ST elevation (NSTEMI) myocardial infarction: Secondary | ICD-10-CM | POA: Diagnosis not present

## 2020-11-28 DIAGNOSIS — U071 COVID-19: Secondary | ICD-10-CM | POA: Diagnosis not present

## 2020-11-28 DIAGNOSIS — Z20828 Contact with and (suspected) exposure to other viral communicable diseases: Secondary | ICD-10-CM | POA: Diagnosis not present

## 2020-12-05 DIAGNOSIS — U071 COVID-19: Secondary | ICD-10-CM | POA: Diagnosis not present

## 2020-12-05 DIAGNOSIS — Z20828 Contact with and (suspected) exposure to other viral communicable diseases: Secondary | ICD-10-CM | POA: Diagnosis not present

## 2020-12-07 DIAGNOSIS — E119 Type 2 diabetes mellitus without complications: Secondary | ICD-10-CM | POA: Diagnosis not present

## 2020-12-07 DIAGNOSIS — E785 Hyperlipidemia, unspecified: Secondary | ICD-10-CM | POA: Diagnosis not present

## 2020-12-07 DIAGNOSIS — E039 Hypothyroidism, unspecified: Secondary | ICD-10-CM | POA: Diagnosis not present

## 2020-12-07 DIAGNOSIS — I10 Essential (primary) hypertension: Secondary | ICD-10-CM | POA: Diagnosis not present

## 2020-12-07 DIAGNOSIS — R2681 Unsteadiness on feet: Secondary | ICD-10-CM | POA: Diagnosis not present

## 2020-12-11 DIAGNOSIS — U071 COVID-19: Secondary | ICD-10-CM | POA: Diagnosis not present

## 2020-12-11 DIAGNOSIS — Z20828 Contact with and (suspected) exposure to other viral communicable diseases: Secondary | ICD-10-CM | POA: Diagnosis not present

## 2020-12-18 DIAGNOSIS — U071 COVID-19: Secondary | ICD-10-CM | POA: Diagnosis not present

## 2020-12-18 DIAGNOSIS — Z20828 Contact with and (suspected) exposure to other viral communicable diseases: Secondary | ICD-10-CM | POA: Diagnosis not present

## 2020-12-25 DIAGNOSIS — U071 COVID-19: Secondary | ICD-10-CM | POA: Diagnosis not present

## 2020-12-25 DIAGNOSIS — Z20828 Contact with and (suspected) exposure to other viral communicable diseases: Secondary | ICD-10-CM | POA: Diagnosis not present

## 2021-01-01 DIAGNOSIS — Z20828 Contact with and (suspected) exposure to other viral communicable diseases: Secondary | ICD-10-CM | POA: Diagnosis not present

## 2021-01-01 DIAGNOSIS — U071 COVID-19: Secondary | ICD-10-CM | POA: Diagnosis not present

## 2021-01-04 DIAGNOSIS — B351 Tinea unguium: Secondary | ICD-10-CM | POA: Diagnosis not present

## 2021-01-04 DIAGNOSIS — M79675 Pain in left toe(s): Secondary | ICD-10-CM | POA: Diagnosis not present

## 2021-01-04 DIAGNOSIS — M79674 Pain in right toe(s): Secondary | ICD-10-CM | POA: Diagnosis not present

## 2021-01-09 DIAGNOSIS — I10 Essential (primary) hypertension: Secondary | ICD-10-CM | POA: Diagnosis not present

## 2021-01-09 DIAGNOSIS — E785 Hyperlipidemia, unspecified: Secondary | ICD-10-CM | POA: Diagnosis not present

## 2021-01-09 DIAGNOSIS — E039 Hypothyroidism, unspecified: Secondary | ICD-10-CM | POA: Diagnosis not present

## 2021-01-09 DIAGNOSIS — G309 Alzheimer's disease, unspecified: Secondary | ICD-10-CM | POA: Diagnosis not present

## 2021-01-09 DIAGNOSIS — F0280 Dementia in other diseases classified elsewhere without behavioral disturbance: Secondary | ICD-10-CM | POA: Diagnosis not present

## 2021-01-14 DIAGNOSIS — R32 Unspecified urinary incontinence: Secondary | ICD-10-CM | POA: Diagnosis not present

## 2021-01-14 DIAGNOSIS — Z741 Need for assistance with personal care: Secondary | ICD-10-CM | POA: Diagnosis not present

## 2021-01-14 DIAGNOSIS — F0390 Unspecified dementia without behavioral disturbance: Secondary | ICD-10-CM | POA: Diagnosis not present

## 2021-01-14 DIAGNOSIS — G301 Alzheimer's disease with late onset: Secondary | ICD-10-CM | POA: Diagnosis not present

## 2021-01-14 DIAGNOSIS — Z993 Dependence on wheelchair: Secondary | ICD-10-CM | POA: Diagnosis not present

## 2021-01-14 DIAGNOSIS — Z87440 Personal history of urinary (tract) infections: Secondary | ICD-10-CM | POA: Diagnosis not present

## 2021-01-14 DIAGNOSIS — I129 Hypertensive chronic kidney disease with stage 1 through stage 4 chronic kidney disease, or unspecified chronic kidney disease: Secondary | ICD-10-CM | POA: Diagnosis not present

## 2021-01-14 DIAGNOSIS — N189 Chronic kidney disease, unspecified: Secondary | ICD-10-CM | POA: Diagnosis not present

## 2021-01-14 DIAGNOSIS — Z66 Do not resuscitate: Secondary | ICD-10-CM | POA: Diagnosis not present

## 2021-01-14 DIAGNOSIS — Z515 Encounter for palliative care: Secondary | ICD-10-CM | POA: Diagnosis not present

## 2021-01-14 DIAGNOSIS — R627 Adult failure to thrive: Secondary | ICD-10-CM | POA: Diagnosis not present

## 2021-01-14 DIAGNOSIS — I252 Old myocardial infarction: Secondary | ICD-10-CM | POA: Diagnosis not present

## 2021-01-14 DIAGNOSIS — E1122 Type 2 diabetes mellitus with diabetic chronic kidney disease: Secondary | ICD-10-CM | POA: Diagnosis not present

## 2021-01-14 DIAGNOSIS — G40909 Epilepsy, unspecified, not intractable, without status epilepticus: Secondary | ICD-10-CM | POA: Diagnosis not present

## 2021-01-14 DIAGNOSIS — E039 Hypothyroidism, unspecified: Secondary | ICD-10-CM | POA: Diagnosis not present

## 2021-01-14 DIAGNOSIS — E785 Hyperlipidemia, unspecified: Secondary | ICD-10-CM | POA: Diagnosis not present

## 2021-01-14 DIAGNOSIS — K5900 Constipation, unspecified: Secondary | ICD-10-CM | POA: Diagnosis not present

## 2021-01-14 DIAGNOSIS — R159 Full incontinence of feces: Secondary | ICD-10-CM | POA: Diagnosis not present

## 2021-01-16 DIAGNOSIS — Z741 Need for assistance with personal care: Secondary | ICD-10-CM | POA: Diagnosis not present

## 2021-01-16 DIAGNOSIS — R159 Full incontinence of feces: Secondary | ICD-10-CM | POA: Diagnosis not present

## 2021-01-16 DIAGNOSIS — G40909 Epilepsy, unspecified, not intractable, without status epilepticus: Secondary | ICD-10-CM | POA: Diagnosis not present

## 2021-01-16 DIAGNOSIS — K5900 Constipation, unspecified: Secondary | ICD-10-CM | POA: Diagnosis not present

## 2021-01-16 DIAGNOSIS — I129 Hypertensive chronic kidney disease with stage 1 through stage 4 chronic kidney disease, or unspecified chronic kidney disease: Secondary | ICD-10-CM | POA: Diagnosis not present

## 2021-01-16 DIAGNOSIS — G301 Alzheimer's disease with late onset: Secondary | ICD-10-CM | POA: Diagnosis not present

## 2021-01-16 DIAGNOSIS — Z87440 Personal history of urinary (tract) infections: Secondary | ICD-10-CM | POA: Diagnosis not present

## 2021-01-16 DIAGNOSIS — R32 Unspecified urinary incontinence: Secondary | ICD-10-CM | POA: Diagnosis not present

## 2021-01-16 DIAGNOSIS — Z993 Dependence on wheelchair: Secondary | ICD-10-CM | POA: Diagnosis not present

## 2021-01-16 DIAGNOSIS — F0390 Unspecified dementia without behavioral disturbance: Secondary | ICD-10-CM | POA: Diagnosis not present

## 2021-01-16 DIAGNOSIS — E1122 Type 2 diabetes mellitus with diabetic chronic kidney disease: Secondary | ICD-10-CM | POA: Diagnosis not present

## 2021-01-16 DIAGNOSIS — N189 Chronic kidney disease, unspecified: Secondary | ICD-10-CM | POA: Diagnosis not present

## 2021-01-16 DIAGNOSIS — Z515 Encounter for palliative care: Secondary | ICD-10-CM | POA: Diagnosis not present

## 2021-01-16 DIAGNOSIS — E785 Hyperlipidemia, unspecified: Secondary | ICD-10-CM | POA: Diagnosis not present

## 2021-01-16 DIAGNOSIS — Z66 Do not resuscitate: Secondary | ICD-10-CM | POA: Diagnosis not present

## 2021-01-16 DIAGNOSIS — R627 Adult failure to thrive: Secondary | ICD-10-CM | POA: Diagnosis not present

## 2021-01-16 DIAGNOSIS — I252 Old myocardial infarction: Secondary | ICD-10-CM | POA: Diagnosis not present

## 2021-01-16 DIAGNOSIS — E039 Hypothyroidism, unspecified: Secondary | ICD-10-CM | POA: Diagnosis not present

## 2021-01-17 DIAGNOSIS — G40909 Epilepsy, unspecified, not intractable, without status epilepticus: Secondary | ICD-10-CM | POA: Diagnosis not present

## 2021-01-17 DIAGNOSIS — F0390 Unspecified dementia without behavioral disturbance: Secondary | ICD-10-CM | POA: Diagnosis not present

## 2021-01-17 DIAGNOSIS — I129 Hypertensive chronic kidney disease with stage 1 through stage 4 chronic kidney disease, or unspecified chronic kidney disease: Secondary | ICD-10-CM | POA: Diagnosis not present

## 2021-01-17 DIAGNOSIS — E1122 Type 2 diabetes mellitus with diabetic chronic kidney disease: Secondary | ICD-10-CM | POA: Diagnosis not present

## 2021-01-17 DIAGNOSIS — G301 Alzheimer's disease with late onset: Secondary | ICD-10-CM | POA: Diagnosis not present

## 2021-01-17 DIAGNOSIS — R627 Adult failure to thrive: Secondary | ICD-10-CM | POA: Diagnosis not present

## 2021-01-18 DIAGNOSIS — R627 Adult failure to thrive: Secondary | ICD-10-CM | POA: Diagnosis not present

## 2021-01-18 DIAGNOSIS — E1122 Type 2 diabetes mellitus with diabetic chronic kidney disease: Secondary | ICD-10-CM | POA: Diagnosis not present

## 2021-01-18 DIAGNOSIS — I129 Hypertensive chronic kidney disease with stage 1 through stage 4 chronic kidney disease, or unspecified chronic kidney disease: Secondary | ICD-10-CM | POA: Diagnosis not present

## 2021-01-18 DIAGNOSIS — F0390 Unspecified dementia without behavioral disturbance: Secondary | ICD-10-CM | POA: Diagnosis not present

## 2021-01-18 DIAGNOSIS — G40909 Epilepsy, unspecified, not intractable, without status epilepticus: Secondary | ICD-10-CM | POA: Diagnosis not present

## 2021-01-18 DIAGNOSIS — G301 Alzheimer's disease with late onset: Secondary | ICD-10-CM | POA: Diagnosis not present

## 2021-01-22 DIAGNOSIS — E1122 Type 2 diabetes mellitus with diabetic chronic kidney disease: Secondary | ICD-10-CM | POA: Diagnosis not present

## 2021-01-22 DIAGNOSIS — F0390 Unspecified dementia without behavioral disturbance: Secondary | ICD-10-CM | POA: Diagnosis not present

## 2021-01-22 DIAGNOSIS — I129 Hypertensive chronic kidney disease with stage 1 through stage 4 chronic kidney disease, or unspecified chronic kidney disease: Secondary | ICD-10-CM | POA: Diagnosis not present

## 2021-01-22 DIAGNOSIS — R627 Adult failure to thrive: Secondary | ICD-10-CM | POA: Diagnosis not present

## 2021-01-22 DIAGNOSIS — G40909 Epilepsy, unspecified, not intractable, without status epilepticus: Secondary | ICD-10-CM | POA: Diagnosis not present

## 2021-01-22 DIAGNOSIS — G301 Alzheimer's disease with late onset: Secondary | ICD-10-CM | POA: Diagnosis not present

## 2021-01-23 DIAGNOSIS — R627 Adult failure to thrive: Secondary | ICD-10-CM | POA: Diagnosis not present

## 2021-01-23 DIAGNOSIS — G301 Alzheimer's disease with late onset: Secondary | ICD-10-CM | POA: Diagnosis not present

## 2021-01-23 DIAGNOSIS — F0390 Unspecified dementia without behavioral disturbance: Secondary | ICD-10-CM | POA: Diagnosis not present

## 2021-01-23 DIAGNOSIS — G40909 Epilepsy, unspecified, not intractable, without status epilepticus: Secondary | ICD-10-CM | POA: Diagnosis not present

## 2021-01-23 DIAGNOSIS — I129 Hypertensive chronic kidney disease with stage 1 through stage 4 chronic kidney disease, or unspecified chronic kidney disease: Secondary | ICD-10-CM | POA: Diagnosis not present

## 2021-01-23 DIAGNOSIS — E1122 Type 2 diabetes mellitus with diabetic chronic kidney disease: Secondary | ICD-10-CM | POA: Diagnosis not present

## 2021-01-25 DIAGNOSIS — R627 Adult failure to thrive: Secondary | ICD-10-CM | POA: Diagnosis not present

## 2021-01-25 DIAGNOSIS — G40909 Epilepsy, unspecified, not intractable, without status epilepticus: Secondary | ICD-10-CM | POA: Diagnosis not present

## 2021-01-25 DIAGNOSIS — E1122 Type 2 diabetes mellitus with diabetic chronic kidney disease: Secondary | ICD-10-CM | POA: Diagnosis not present

## 2021-01-25 DIAGNOSIS — G301 Alzheimer's disease with late onset: Secondary | ICD-10-CM | POA: Diagnosis not present

## 2021-01-25 DIAGNOSIS — F0390 Unspecified dementia without behavioral disturbance: Secondary | ICD-10-CM | POA: Diagnosis not present

## 2021-01-25 DIAGNOSIS — I129 Hypertensive chronic kidney disease with stage 1 through stage 4 chronic kidney disease, or unspecified chronic kidney disease: Secondary | ICD-10-CM | POA: Diagnosis not present

## 2021-01-26 DIAGNOSIS — R627 Adult failure to thrive: Secondary | ICD-10-CM | POA: Diagnosis not present

## 2021-01-26 DIAGNOSIS — G301 Alzheimer's disease with late onset: Secondary | ICD-10-CM | POA: Diagnosis not present

## 2021-01-26 DIAGNOSIS — F0390 Unspecified dementia without behavioral disturbance: Secondary | ICD-10-CM | POA: Diagnosis not present

## 2021-01-26 DIAGNOSIS — G40909 Epilepsy, unspecified, not intractable, without status epilepticus: Secondary | ICD-10-CM | POA: Diagnosis not present

## 2021-01-26 DIAGNOSIS — E1122 Type 2 diabetes mellitus with diabetic chronic kidney disease: Secondary | ICD-10-CM | POA: Diagnosis not present

## 2021-01-26 DIAGNOSIS — I129 Hypertensive chronic kidney disease with stage 1 through stage 4 chronic kidney disease, or unspecified chronic kidney disease: Secondary | ICD-10-CM | POA: Diagnosis not present

## 2021-01-30 DIAGNOSIS — F0390 Unspecified dementia without behavioral disturbance: Secondary | ICD-10-CM | POA: Diagnosis not present

## 2021-01-30 DIAGNOSIS — W19XXXA Unspecified fall, initial encounter: Secondary | ICD-10-CM | POA: Diagnosis not present

## 2021-01-30 DIAGNOSIS — I129 Hypertensive chronic kidney disease with stage 1 through stage 4 chronic kidney disease, or unspecified chronic kidney disease: Secondary | ICD-10-CM | POA: Diagnosis not present

## 2021-01-30 DIAGNOSIS — R627 Adult failure to thrive: Secondary | ICD-10-CM | POA: Diagnosis not present

## 2021-01-30 DIAGNOSIS — Z043 Encounter for examination and observation following other accident: Secondary | ICD-10-CM | POA: Diagnosis not present

## 2021-01-30 DIAGNOSIS — E1122 Type 2 diabetes mellitus with diabetic chronic kidney disease: Secondary | ICD-10-CM | POA: Diagnosis not present

## 2021-01-30 DIAGNOSIS — G40909 Epilepsy, unspecified, not intractable, without status epilepticus: Secondary | ICD-10-CM | POA: Diagnosis not present

## 2021-01-30 DIAGNOSIS — S8002XA Contusion of left knee, initial encounter: Secondary | ICD-10-CM | POA: Diagnosis not present

## 2021-01-30 DIAGNOSIS — R2681 Unsteadiness on feet: Secondary | ICD-10-CM | POA: Diagnosis not present

## 2021-01-30 DIAGNOSIS — G301 Alzheimer's disease with late onset: Secondary | ICD-10-CM | POA: Diagnosis not present

## 2021-01-30 DIAGNOSIS — Z743 Need for continuous supervision: Secondary | ICD-10-CM | POA: Diagnosis not present

## 2021-01-30 DIAGNOSIS — R279 Unspecified lack of coordination: Secondary | ICD-10-CM | POA: Diagnosis not present

## 2021-01-30 DIAGNOSIS — R404 Transient alteration of awareness: Secondary | ICD-10-CM | POA: Diagnosis not present

## 2021-01-30 DIAGNOSIS — R5381 Other malaise: Secondary | ICD-10-CM | POA: Diagnosis not present

## 2021-02-02 DIAGNOSIS — I129 Hypertensive chronic kidney disease with stage 1 through stage 4 chronic kidney disease, or unspecified chronic kidney disease: Secondary | ICD-10-CM | POA: Diagnosis not present

## 2021-02-02 DIAGNOSIS — F0390 Unspecified dementia without behavioral disturbance: Secondary | ICD-10-CM | POA: Diagnosis not present

## 2021-02-02 DIAGNOSIS — G40909 Epilepsy, unspecified, not intractable, without status epilepticus: Secondary | ICD-10-CM | POA: Diagnosis not present

## 2021-02-02 DIAGNOSIS — R627 Adult failure to thrive: Secondary | ICD-10-CM | POA: Diagnosis not present

## 2021-02-02 DIAGNOSIS — E1122 Type 2 diabetes mellitus with diabetic chronic kidney disease: Secondary | ICD-10-CM | POA: Diagnosis not present

## 2021-02-02 DIAGNOSIS — G301 Alzheimer's disease with late onset: Secondary | ICD-10-CM | POA: Diagnosis not present

## 2021-02-06 DIAGNOSIS — F0390 Unspecified dementia without behavioral disturbance: Secondary | ICD-10-CM | POA: Diagnosis not present

## 2021-02-06 DIAGNOSIS — E1122 Type 2 diabetes mellitus with diabetic chronic kidney disease: Secondary | ICD-10-CM | POA: Diagnosis not present

## 2021-02-06 DIAGNOSIS — G301 Alzheimer's disease with late onset: Secondary | ICD-10-CM | POA: Diagnosis not present

## 2021-02-06 DIAGNOSIS — G40909 Epilepsy, unspecified, not intractable, without status epilepticus: Secondary | ICD-10-CM | POA: Diagnosis not present

## 2021-02-06 DIAGNOSIS — I129 Hypertensive chronic kidney disease with stage 1 through stage 4 chronic kidney disease, or unspecified chronic kidney disease: Secondary | ICD-10-CM | POA: Diagnosis not present

## 2021-02-06 DIAGNOSIS — R627 Adult failure to thrive: Secondary | ICD-10-CM | POA: Diagnosis not present

## 2021-02-08 DIAGNOSIS — R627 Adult failure to thrive: Secondary | ICD-10-CM | POA: Diagnosis not present

## 2021-02-08 DIAGNOSIS — E1122 Type 2 diabetes mellitus with diabetic chronic kidney disease: Secondary | ICD-10-CM | POA: Diagnosis not present

## 2021-02-08 DIAGNOSIS — I129 Hypertensive chronic kidney disease with stage 1 through stage 4 chronic kidney disease, or unspecified chronic kidney disease: Secondary | ICD-10-CM | POA: Diagnosis not present

## 2021-02-08 DIAGNOSIS — F0390 Unspecified dementia without behavioral disturbance: Secondary | ICD-10-CM | POA: Diagnosis not present

## 2021-02-08 DIAGNOSIS — G40909 Epilepsy, unspecified, not intractable, without status epilepticus: Secondary | ICD-10-CM | POA: Diagnosis not present

## 2021-02-08 DIAGNOSIS — G301 Alzheimer's disease with late onset: Secondary | ICD-10-CM | POA: Diagnosis not present

## 2021-02-09 DIAGNOSIS — I214 Non-ST elevation (NSTEMI) myocardial infarction: Secondary | ICD-10-CM | POA: Diagnosis not present

## 2021-02-09 DIAGNOSIS — E119 Type 2 diabetes mellitus without complications: Secondary | ICD-10-CM | POA: Diagnosis not present

## 2021-02-09 DIAGNOSIS — G40909 Epilepsy, unspecified, not intractable, without status epilepticus: Secondary | ICD-10-CM | POA: Diagnosis not present

## 2021-02-09 DIAGNOSIS — E1122 Type 2 diabetes mellitus with diabetic chronic kidney disease: Secondary | ICD-10-CM | POA: Diagnosis not present

## 2021-02-09 DIAGNOSIS — E039 Hypothyroidism, unspecified: Secondary | ICD-10-CM | POA: Diagnosis not present

## 2021-02-09 DIAGNOSIS — G309 Alzheimer's disease, unspecified: Secondary | ICD-10-CM | POA: Diagnosis not present

## 2021-02-09 DIAGNOSIS — F0390 Unspecified dementia without behavioral disturbance: Secondary | ICD-10-CM | POA: Diagnosis not present

## 2021-02-09 DIAGNOSIS — E785 Hyperlipidemia, unspecified: Secondary | ICD-10-CM | POA: Diagnosis not present

## 2021-02-09 DIAGNOSIS — F0280 Dementia in other diseases classified elsewhere without behavioral disturbance: Secondary | ICD-10-CM | POA: Diagnosis not present

## 2021-02-09 DIAGNOSIS — I129 Hypertensive chronic kidney disease with stage 1 through stage 4 chronic kidney disease, or unspecified chronic kidney disease: Secondary | ICD-10-CM | POA: Diagnosis not present

## 2021-02-09 DIAGNOSIS — G301 Alzheimer's disease with late onset: Secondary | ICD-10-CM | POA: Diagnosis not present

## 2021-02-09 DIAGNOSIS — I10 Essential (primary) hypertension: Secondary | ICD-10-CM | POA: Diagnosis not present

## 2021-02-09 DIAGNOSIS — R627 Adult failure to thrive: Secondary | ICD-10-CM | POA: Diagnosis not present

## 2021-02-13 DIAGNOSIS — R627 Adult failure to thrive: Secondary | ICD-10-CM | POA: Diagnosis not present

## 2021-02-13 DIAGNOSIS — I129 Hypertensive chronic kidney disease with stage 1 through stage 4 chronic kidney disease, or unspecified chronic kidney disease: Secondary | ICD-10-CM | POA: Diagnosis not present

## 2021-02-13 DIAGNOSIS — F0390 Unspecified dementia without behavioral disturbance: Secondary | ICD-10-CM | POA: Diagnosis not present

## 2021-02-13 DIAGNOSIS — G40909 Epilepsy, unspecified, not intractable, without status epilepticus: Secondary | ICD-10-CM | POA: Diagnosis not present

## 2021-02-13 DIAGNOSIS — E1122 Type 2 diabetes mellitus with diabetic chronic kidney disease: Secondary | ICD-10-CM | POA: Diagnosis not present

## 2021-02-13 DIAGNOSIS — G301 Alzheimer's disease with late onset: Secondary | ICD-10-CM | POA: Diagnosis not present

## 2021-02-15 DIAGNOSIS — E1122 Type 2 diabetes mellitus with diabetic chronic kidney disease: Secondary | ICD-10-CM | POA: Diagnosis not present

## 2021-02-15 DIAGNOSIS — I129 Hypertensive chronic kidney disease with stage 1 through stage 4 chronic kidney disease, or unspecified chronic kidney disease: Secondary | ICD-10-CM | POA: Diagnosis not present

## 2021-02-15 DIAGNOSIS — R627 Adult failure to thrive: Secondary | ICD-10-CM | POA: Diagnosis not present

## 2021-02-15 DIAGNOSIS — F0390 Unspecified dementia without behavioral disturbance: Secondary | ICD-10-CM | POA: Diagnosis not present

## 2021-02-15 DIAGNOSIS — G40909 Epilepsy, unspecified, not intractable, without status epilepticus: Secondary | ICD-10-CM | POA: Diagnosis not present

## 2021-02-15 DIAGNOSIS — G301 Alzheimer's disease with late onset: Secondary | ICD-10-CM | POA: Diagnosis not present

## 2021-02-16 DIAGNOSIS — E785 Hyperlipidemia, unspecified: Secondary | ICD-10-CM | POA: Diagnosis not present

## 2021-02-16 DIAGNOSIS — N189 Chronic kidney disease, unspecified: Secondary | ICD-10-CM | POA: Diagnosis not present

## 2021-02-16 DIAGNOSIS — Z993 Dependence on wheelchair: Secondary | ICD-10-CM | POA: Diagnosis not present

## 2021-02-16 DIAGNOSIS — Z66 Do not resuscitate: Secondary | ICD-10-CM | POA: Diagnosis not present

## 2021-02-16 DIAGNOSIS — I129 Hypertensive chronic kidney disease with stage 1 through stage 4 chronic kidney disease, or unspecified chronic kidney disease: Secondary | ICD-10-CM | POA: Diagnosis not present

## 2021-02-16 DIAGNOSIS — G301 Alzheimer's disease with late onset: Secondary | ICD-10-CM | POA: Diagnosis not present

## 2021-02-16 DIAGNOSIS — K5900 Constipation, unspecified: Secondary | ICD-10-CM | POA: Diagnosis not present

## 2021-02-16 DIAGNOSIS — I252 Old myocardial infarction: Secondary | ICD-10-CM | POA: Diagnosis not present

## 2021-02-16 DIAGNOSIS — R159 Full incontinence of feces: Secondary | ICD-10-CM | POA: Diagnosis not present

## 2021-02-16 DIAGNOSIS — E039 Hypothyroidism, unspecified: Secondary | ICD-10-CM | POA: Diagnosis not present

## 2021-02-16 DIAGNOSIS — Z515 Encounter for palliative care: Secondary | ICD-10-CM | POA: Diagnosis not present

## 2021-02-16 DIAGNOSIS — R627 Adult failure to thrive: Secondary | ICD-10-CM | POA: Diagnosis not present

## 2021-02-16 DIAGNOSIS — Z87440 Personal history of urinary (tract) infections: Secondary | ICD-10-CM | POA: Diagnosis not present

## 2021-02-16 DIAGNOSIS — E1122 Type 2 diabetes mellitus with diabetic chronic kidney disease: Secondary | ICD-10-CM | POA: Diagnosis not present

## 2021-02-16 DIAGNOSIS — R32 Unspecified urinary incontinence: Secondary | ICD-10-CM | POA: Diagnosis not present

## 2021-02-16 DIAGNOSIS — F0390 Unspecified dementia without behavioral disturbance: Secondary | ICD-10-CM | POA: Diagnosis not present

## 2021-02-16 DIAGNOSIS — G40909 Epilepsy, unspecified, not intractable, without status epilepticus: Secondary | ICD-10-CM | POA: Diagnosis not present

## 2021-02-16 DIAGNOSIS — Z741 Need for assistance with personal care: Secondary | ICD-10-CM | POA: Diagnosis not present

## 2021-02-22 DIAGNOSIS — I129 Hypertensive chronic kidney disease with stage 1 through stage 4 chronic kidney disease, or unspecified chronic kidney disease: Secondary | ICD-10-CM | POA: Diagnosis not present

## 2021-02-22 DIAGNOSIS — G301 Alzheimer's disease with late onset: Secondary | ICD-10-CM | POA: Diagnosis not present

## 2021-02-22 DIAGNOSIS — G40909 Epilepsy, unspecified, not intractable, without status epilepticus: Secondary | ICD-10-CM | POA: Diagnosis not present

## 2021-02-22 DIAGNOSIS — F0390 Unspecified dementia without behavioral disturbance: Secondary | ICD-10-CM | POA: Diagnosis not present

## 2021-02-22 DIAGNOSIS — R627 Adult failure to thrive: Secondary | ICD-10-CM | POA: Diagnosis not present

## 2021-02-22 DIAGNOSIS — E1122 Type 2 diabetes mellitus with diabetic chronic kidney disease: Secondary | ICD-10-CM | POA: Diagnosis not present

## 2021-02-23 DIAGNOSIS — R627 Adult failure to thrive: Secondary | ICD-10-CM | POA: Diagnosis not present

## 2021-02-23 DIAGNOSIS — F0390 Unspecified dementia without behavioral disturbance: Secondary | ICD-10-CM | POA: Diagnosis not present

## 2021-02-23 DIAGNOSIS — G40909 Epilepsy, unspecified, not intractable, without status epilepticus: Secondary | ICD-10-CM | POA: Diagnosis not present

## 2021-02-23 DIAGNOSIS — I129 Hypertensive chronic kidney disease with stage 1 through stage 4 chronic kidney disease, or unspecified chronic kidney disease: Secondary | ICD-10-CM | POA: Diagnosis not present

## 2021-02-23 DIAGNOSIS — E1122 Type 2 diabetes mellitus with diabetic chronic kidney disease: Secondary | ICD-10-CM | POA: Diagnosis not present

## 2021-02-23 DIAGNOSIS — G301 Alzheimer's disease with late onset: Secondary | ICD-10-CM | POA: Diagnosis not present

## 2021-02-27 DIAGNOSIS — G301 Alzheimer's disease with late onset: Secondary | ICD-10-CM | POA: Diagnosis not present

## 2021-02-27 DIAGNOSIS — I129 Hypertensive chronic kidney disease with stage 1 through stage 4 chronic kidney disease, or unspecified chronic kidney disease: Secondary | ICD-10-CM | POA: Diagnosis not present

## 2021-02-27 DIAGNOSIS — R627 Adult failure to thrive: Secondary | ICD-10-CM | POA: Diagnosis not present

## 2021-02-27 DIAGNOSIS — F0390 Unspecified dementia without behavioral disturbance: Secondary | ICD-10-CM | POA: Diagnosis not present

## 2021-02-27 DIAGNOSIS — G40909 Epilepsy, unspecified, not intractable, without status epilepticus: Secondary | ICD-10-CM | POA: Diagnosis not present

## 2021-02-27 DIAGNOSIS — E1122 Type 2 diabetes mellitus with diabetic chronic kidney disease: Secondary | ICD-10-CM | POA: Diagnosis not present

## 2021-03-01 DIAGNOSIS — E785 Hyperlipidemia, unspecified: Secondary | ICD-10-CM | POA: Diagnosis not present

## 2021-03-01 DIAGNOSIS — G309 Alzheimer's disease, unspecified: Secondary | ICD-10-CM | POA: Diagnosis not present

## 2021-03-01 DIAGNOSIS — E039 Hypothyroidism, unspecified: Secondary | ICD-10-CM | POA: Diagnosis not present

## 2021-03-01 DIAGNOSIS — F0280 Dementia in other diseases classified elsewhere without behavioral disturbance: Secondary | ICD-10-CM | POA: Diagnosis not present

## 2021-03-01 DIAGNOSIS — R627 Adult failure to thrive: Secondary | ICD-10-CM | POA: Diagnosis not present

## 2021-03-01 DIAGNOSIS — I10 Essential (primary) hypertension: Secondary | ICD-10-CM | POA: Diagnosis not present

## 2021-03-01 DIAGNOSIS — G40909 Epilepsy, unspecified, not intractable, without status epilepticus: Secondary | ICD-10-CM | POA: Diagnosis not present

## 2021-03-01 DIAGNOSIS — E119 Type 2 diabetes mellitus without complications: Secondary | ICD-10-CM | POA: Diagnosis not present

## 2021-03-01 DIAGNOSIS — G301 Alzheimer's disease with late onset: Secondary | ICD-10-CM | POA: Diagnosis not present

## 2021-03-01 DIAGNOSIS — F0390 Unspecified dementia without behavioral disturbance: Secondary | ICD-10-CM | POA: Diagnosis not present

## 2021-03-01 DIAGNOSIS — E1122 Type 2 diabetes mellitus with diabetic chronic kidney disease: Secondary | ICD-10-CM | POA: Diagnosis not present

## 2021-03-01 DIAGNOSIS — I129 Hypertensive chronic kidney disease with stage 1 through stage 4 chronic kidney disease, or unspecified chronic kidney disease: Secondary | ICD-10-CM | POA: Diagnosis not present

## 2021-03-02 DIAGNOSIS — G301 Alzheimer's disease with late onset: Secondary | ICD-10-CM | POA: Diagnosis not present

## 2021-03-02 DIAGNOSIS — F0390 Unspecified dementia without behavioral disturbance: Secondary | ICD-10-CM | POA: Diagnosis not present

## 2021-03-02 DIAGNOSIS — E1122 Type 2 diabetes mellitus with diabetic chronic kidney disease: Secondary | ICD-10-CM | POA: Diagnosis not present

## 2021-03-02 DIAGNOSIS — I129 Hypertensive chronic kidney disease with stage 1 through stage 4 chronic kidney disease, or unspecified chronic kidney disease: Secondary | ICD-10-CM | POA: Diagnosis not present

## 2021-03-02 DIAGNOSIS — R627 Adult failure to thrive: Secondary | ICD-10-CM | POA: Diagnosis not present

## 2021-03-02 DIAGNOSIS — G40909 Epilepsy, unspecified, not intractable, without status epilepticus: Secondary | ICD-10-CM | POA: Diagnosis not present

## 2021-03-06 DIAGNOSIS — R627 Adult failure to thrive: Secondary | ICD-10-CM | POA: Diagnosis not present

## 2021-03-06 DIAGNOSIS — F0390 Unspecified dementia without behavioral disturbance: Secondary | ICD-10-CM | POA: Diagnosis not present

## 2021-03-06 DIAGNOSIS — G40909 Epilepsy, unspecified, not intractable, without status epilepticus: Secondary | ICD-10-CM | POA: Diagnosis not present

## 2021-03-06 DIAGNOSIS — E1122 Type 2 diabetes mellitus with diabetic chronic kidney disease: Secondary | ICD-10-CM | POA: Diagnosis not present

## 2021-03-06 DIAGNOSIS — I129 Hypertensive chronic kidney disease with stage 1 through stage 4 chronic kidney disease, or unspecified chronic kidney disease: Secondary | ICD-10-CM | POA: Diagnosis not present

## 2021-03-06 DIAGNOSIS — G301 Alzheimer's disease with late onset: Secondary | ICD-10-CM | POA: Diagnosis not present

## 2021-03-08 DIAGNOSIS — I129 Hypertensive chronic kidney disease with stage 1 through stage 4 chronic kidney disease, or unspecified chronic kidney disease: Secondary | ICD-10-CM | POA: Diagnosis not present

## 2021-03-08 DIAGNOSIS — R627 Adult failure to thrive: Secondary | ICD-10-CM | POA: Diagnosis not present

## 2021-03-08 DIAGNOSIS — G40909 Epilepsy, unspecified, not intractable, without status epilepticus: Secondary | ICD-10-CM | POA: Diagnosis not present

## 2021-03-08 DIAGNOSIS — G301 Alzheimer's disease with late onset: Secondary | ICD-10-CM | POA: Diagnosis not present

## 2021-03-08 DIAGNOSIS — F0390 Unspecified dementia without behavioral disturbance: Secondary | ICD-10-CM | POA: Diagnosis not present

## 2021-03-08 DIAGNOSIS — E1122 Type 2 diabetes mellitus with diabetic chronic kidney disease: Secondary | ICD-10-CM | POA: Diagnosis not present

## 2021-03-09 DIAGNOSIS — F0390 Unspecified dementia without behavioral disturbance: Secondary | ICD-10-CM | POA: Diagnosis not present

## 2021-03-09 DIAGNOSIS — G40909 Epilepsy, unspecified, not intractable, without status epilepticus: Secondary | ICD-10-CM | POA: Diagnosis not present

## 2021-03-09 DIAGNOSIS — E1122 Type 2 diabetes mellitus with diabetic chronic kidney disease: Secondary | ICD-10-CM | POA: Diagnosis not present

## 2021-03-09 DIAGNOSIS — R627 Adult failure to thrive: Secondary | ICD-10-CM | POA: Diagnosis not present

## 2021-03-09 DIAGNOSIS — I129 Hypertensive chronic kidney disease with stage 1 through stage 4 chronic kidney disease, or unspecified chronic kidney disease: Secondary | ICD-10-CM | POA: Diagnosis not present

## 2021-03-09 DIAGNOSIS — G301 Alzheimer's disease with late onset: Secondary | ICD-10-CM | POA: Diagnosis not present

## 2021-03-14 DIAGNOSIS — F0280 Dementia in other diseases classified elsewhere without behavioral disturbance: Secondary | ICD-10-CM | POA: Diagnosis not present

## 2021-03-14 DIAGNOSIS — E785 Hyperlipidemia, unspecified: Secondary | ICD-10-CM | POA: Diagnosis not present

## 2021-03-14 DIAGNOSIS — I214 Non-ST elevation (NSTEMI) myocardial infarction: Secondary | ICD-10-CM | POA: Diagnosis not present

## 2021-03-14 DIAGNOSIS — G309 Alzheimer's disease, unspecified: Secondary | ICD-10-CM | POA: Diagnosis not present

## 2021-03-14 DIAGNOSIS — E119 Type 2 diabetes mellitus without complications: Secondary | ICD-10-CM | POA: Diagnosis not present

## 2021-03-14 DIAGNOSIS — I10 Essential (primary) hypertension: Secondary | ICD-10-CM | POA: Diagnosis not present

## 2021-03-14 DIAGNOSIS — E039 Hypothyroidism, unspecified: Secondary | ICD-10-CM | POA: Diagnosis not present

## 2021-03-16 DIAGNOSIS — G40909 Epilepsy, unspecified, not intractable, without status epilepticus: Secondary | ICD-10-CM | POA: Diagnosis not present

## 2021-03-16 DIAGNOSIS — I129 Hypertensive chronic kidney disease with stage 1 through stage 4 chronic kidney disease, or unspecified chronic kidney disease: Secondary | ICD-10-CM | POA: Diagnosis not present

## 2021-03-16 DIAGNOSIS — G301 Alzheimer's disease with late onset: Secondary | ICD-10-CM | POA: Diagnosis not present

## 2021-03-16 DIAGNOSIS — F0390 Unspecified dementia without behavioral disturbance: Secondary | ICD-10-CM | POA: Diagnosis not present

## 2021-03-16 DIAGNOSIS — R627 Adult failure to thrive: Secondary | ICD-10-CM | POA: Diagnosis not present

## 2021-03-16 DIAGNOSIS — E1122 Type 2 diabetes mellitus with diabetic chronic kidney disease: Secondary | ICD-10-CM | POA: Diagnosis not present

## 2021-03-18 DIAGNOSIS — Z515 Encounter for palliative care: Secondary | ICD-10-CM | POA: Diagnosis not present

## 2021-03-18 DIAGNOSIS — R627 Adult failure to thrive: Secondary | ICD-10-CM | POA: Diagnosis not present

## 2021-03-18 DIAGNOSIS — I129 Hypertensive chronic kidney disease with stage 1 through stage 4 chronic kidney disease, or unspecified chronic kidney disease: Secondary | ICD-10-CM | POA: Diagnosis not present

## 2021-03-18 DIAGNOSIS — E039 Hypothyroidism, unspecified: Secondary | ICD-10-CM | POA: Diagnosis not present

## 2021-03-18 DIAGNOSIS — G301 Alzheimer's disease with late onset: Secondary | ICD-10-CM | POA: Diagnosis not present

## 2021-03-18 DIAGNOSIS — E785 Hyperlipidemia, unspecified: Secondary | ICD-10-CM | POA: Diagnosis not present

## 2021-03-18 DIAGNOSIS — K5900 Constipation, unspecified: Secondary | ICD-10-CM | POA: Diagnosis not present

## 2021-03-18 DIAGNOSIS — Z66 Do not resuscitate: Secondary | ICD-10-CM | POA: Diagnosis not present

## 2021-03-18 DIAGNOSIS — R159 Full incontinence of feces: Secondary | ICD-10-CM | POA: Diagnosis not present

## 2021-03-18 DIAGNOSIS — G40909 Epilepsy, unspecified, not intractable, without status epilepticus: Secondary | ICD-10-CM | POA: Diagnosis not present

## 2021-03-18 DIAGNOSIS — R32 Unspecified urinary incontinence: Secondary | ICD-10-CM | POA: Diagnosis not present

## 2021-03-18 DIAGNOSIS — I252 Old myocardial infarction: Secondary | ICD-10-CM | POA: Diagnosis not present

## 2021-03-18 DIAGNOSIS — F0390 Unspecified dementia without behavioral disturbance: Secondary | ICD-10-CM | POA: Diagnosis not present

## 2021-03-18 DIAGNOSIS — E1122 Type 2 diabetes mellitus with diabetic chronic kidney disease: Secondary | ICD-10-CM | POA: Diagnosis not present

## 2021-03-18 DIAGNOSIS — N189 Chronic kidney disease, unspecified: Secondary | ICD-10-CM | POA: Diagnosis not present

## 2021-03-18 DIAGNOSIS — Z741 Need for assistance with personal care: Secondary | ICD-10-CM | POA: Diagnosis not present

## 2021-03-18 DIAGNOSIS — Z87440 Personal history of urinary (tract) infections: Secondary | ICD-10-CM | POA: Diagnosis not present

## 2021-03-18 DIAGNOSIS — Z993 Dependence on wheelchair: Secondary | ICD-10-CM | POA: Diagnosis not present

## 2021-03-20 DIAGNOSIS — I129 Hypertensive chronic kidney disease with stage 1 through stage 4 chronic kidney disease, or unspecified chronic kidney disease: Secondary | ICD-10-CM | POA: Diagnosis not present

## 2021-03-20 DIAGNOSIS — R627 Adult failure to thrive: Secondary | ICD-10-CM | POA: Diagnosis not present

## 2021-03-20 DIAGNOSIS — G301 Alzheimer's disease with late onset: Secondary | ICD-10-CM | POA: Diagnosis not present

## 2021-03-20 DIAGNOSIS — E1122 Type 2 diabetes mellitus with diabetic chronic kidney disease: Secondary | ICD-10-CM | POA: Diagnosis not present

## 2021-03-20 DIAGNOSIS — G40909 Epilepsy, unspecified, not intractable, without status epilepticus: Secondary | ICD-10-CM | POA: Diagnosis not present

## 2021-03-20 DIAGNOSIS — F0390 Unspecified dementia without behavioral disturbance: Secondary | ICD-10-CM | POA: Diagnosis not present

## 2021-03-22 DIAGNOSIS — G40909 Epilepsy, unspecified, not intractable, without status epilepticus: Secondary | ICD-10-CM | POA: Diagnosis not present

## 2021-03-22 DIAGNOSIS — G301 Alzheimer's disease with late onset: Secondary | ICD-10-CM | POA: Diagnosis not present

## 2021-03-22 DIAGNOSIS — I129 Hypertensive chronic kidney disease with stage 1 through stage 4 chronic kidney disease, or unspecified chronic kidney disease: Secondary | ICD-10-CM | POA: Diagnosis not present

## 2021-03-22 DIAGNOSIS — F0390 Unspecified dementia without behavioral disturbance: Secondary | ICD-10-CM | POA: Diagnosis not present

## 2021-03-22 DIAGNOSIS — R627 Adult failure to thrive: Secondary | ICD-10-CM | POA: Diagnosis not present

## 2021-03-22 DIAGNOSIS — E1122 Type 2 diabetes mellitus with diabetic chronic kidney disease: Secondary | ICD-10-CM | POA: Diagnosis not present

## 2021-03-24 DIAGNOSIS — R627 Adult failure to thrive: Secondary | ICD-10-CM | POA: Diagnosis not present

## 2021-03-24 DIAGNOSIS — G301 Alzheimer's disease with late onset: Secondary | ICD-10-CM | POA: Diagnosis not present

## 2021-03-24 DIAGNOSIS — G40909 Epilepsy, unspecified, not intractable, without status epilepticus: Secondary | ICD-10-CM | POA: Diagnosis not present

## 2021-03-24 DIAGNOSIS — E1122 Type 2 diabetes mellitus with diabetic chronic kidney disease: Secondary | ICD-10-CM | POA: Diagnosis not present

## 2021-03-24 DIAGNOSIS — I129 Hypertensive chronic kidney disease with stage 1 through stage 4 chronic kidney disease, or unspecified chronic kidney disease: Secondary | ICD-10-CM | POA: Diagnosis not present

## 2021-03-24 DIAGNOSIS — F0390 Unspecified dementia without behavioral disturbance: Secondary | ICD-10-CM | POA: Diagnosis not present

## 2021-03-27 DIAGNOSIS — G40909 Epilepsy, unspecified, not intractable, without status epilepticus: Secondary | ICD-10-CM | POA: Diagnosis not present

## 2021-03-27 DIAGNOSIS — R627 Adult failure to thrive: Secondary | ICD-10-CM | POA: Diagnosis not present

## 2021-03-27 DIAGNOSIS — E1122 Type 2 diabetes mellitus with diabetic chronic kidney disease: Secondary | ICD-10-CM | POA: Diagnosis not present

## 2021-03-27 DIAGNOSIS — F0390 Unspecified dementia without behavioral disturbance: Secondary | ICD-10-CM | POA: Diagnosis not present

## 2021-03-27 DIAGNOSIS — I129 Hypertensive chronic kidney disease with stage 1 through stage 4 chronic kidney disease, or unspecified chronic kidney disease: Secondary | ICD-10-CM | POA: Diagnosis not present

## 2021-03-27 DIAGNOSIS — G301 Alzheimer's disease with late onset: Secondary | ICD-10-CM | POA: Diagnosis not present

## 2021-03-28 DIAGNOSIS — F0390 Unspecified dementia without behavioral disturbance: Secondary | ICD-10-CM | POA: Diagnosis not present

## 2021-03-28 DIAGNOSIS — E1122 Type 2 diabetes mellitus with diabetic chronic kidney disease: Secondary | ICD-10-CM | POA: Diagnosis not present

## 2021-03-28 DIAGNOSIS — I129 Hypertensive chronic kidney disease with stage 1 through stage 4 chronic kidney disease, or unspecified chronic kidney disease: Secondary | ICD-10-CM | POA: Diagnosis not present

## 2021-03-28 DIAGNOSIS — R627 Adult failure to thrive: Secondary | ICD-10-CM | POA: Diagnosis not present

## 2021-03-28 DIAGNOSIS — G301 Alzheimer's disease with late onset: Secondary | ICD-10-CM | POA: Diagnosis not present

## 2021-03-28 DIAGNOSIS — G40909 Epilepsy, unspecified, not intractable, without status epilepticus: Secondary | ICD-10-CM | POA: Diagnosis not present

## 2021-03-29 DIAGNOSIS — E785 Hyperlipidemia, unspecified: Secondary | ICD-10-CM | POA: Diagnosis not present

## 2021-03-29 DIAGNOSIS — E1122 Type 2 diabetes mellitus with diabetic chronic kidney disease: Secondary | ICD-10-CM | POA: Diagnosis not present

## 2021-03-29 DIAGNOSIS — F0280 Dementia in other diseases classified elsewhere without behavioral disturbance: Secondary | ICD-10-CM | POA: Diagnosis not present

## 2021-03-29 DIAGNOSIS — I129 Hypertensive chronic kidney disease with stage 1 through stage 4 chronic kidney disease, or unspecified chronic kidney disease: Secondary | ICD-10-CM | POA: Diagnosis not present

## 2021-03-29 DIAGNOSIS — E039 Hypothyroidism, unspecified: Secondary | ICD-10-CM | POA: Diagnosis not present

## 2021-03-29 DIAGNOSIS — I10 Essential (primary) hypertension: Secondary | ICD-10-CM | POA: Diagnosis not present

## 2021-03-29 DIAGNOSIS — E119 Type 2 diabetes mellitus without complications: Secondary | ICD-10-CM | POA: Diagnosis not present

## 2021-03-29 DIAGNOSIS — G301 Alzheimer's disease with late onset: Secondary | ICD-10-CM | POA: Diagnosis not present

## 2021-03-29 DIAGNOSIS — R627 Adult failure to thrive: Secondary | ICD-10-CM | POA: Diagnosis not present

## 2021-03-29 DIAGNOSIS — M79675 Pain in left toe(s): Secondary | ICD-10-CM | POA: Diagnosis not present

## 2021-03-29 DIAGNOSIS — F0390 Unspecified dementia without behavioral disturbance: Secondary | ICD-10-CM | POA: Diagnosis not present

## 2021-03-29 DIAGNOSIS — M79674 Pain in right toe(s): Secondary | ICD-10-CM | POA: Diagnosis not present

## 2021-03-29 DIAGNOSIS — G309 Alzheimer's disease, unspecified: Secondary | ICD-10-CM | POA: Diagnosis not present

## 2021-03-29 DIAGNOSIS — B351 Tinea unguium: Secondary | ICD-10-CM | POA: Diagnosis not present

## 2021-03-29 DIAGNOSIS — G40909 Epilepsy, unspecified, not intractable, without status epilepticus: Secondary | ICD-10-CM | POA: Diagnosis not present

## 2021-03-30 DIAGNOSIS — R627 Adult failure to thrive: Secondary | ICD-10-CM | POA: Diagnosis not present

## 2021-03-30 DIAGNOSIS — F0390 Unspecified dementia without behavioral disturbance: Secondary | ICD-10-CM | POA: Diagnosis not present

## 2021-03-30 DIAGNOSIS — E1122 Type 2 diabetes mellitus with diabetic chronic kidney disease: Secondary | ICD-10-CM | POA: Diagnosis not present

## 2021-03-30 DIAGNOSIS — G40909 Epilepsy, unspecified, not intractable, without status epilepticus: Secondary | ICD-10-CM | POA: Diagnosis not present

## 2021-03-30 DIAGNOSIS — I129 Hypertensive chronic kidney disease with stage 1 through stage 4 chronic kidney disease, or unspecified chronic kidney disease: Secondary | ICD-10-CM | POA: Diagnosis not present

## 2021-03-30 DIAGNOSIS — G301 Alzheimer's disease with late onset: Secondary | ICD-10-CM | POA: Diagnosis not present

## 2021-04-18 DEATH — deceased

## 2021-06-02 IMAGING — DX DG CHEST 1V PORT
1 series · 2 of 2 positions shown · non-contrast
Comparison: None.

CLINICAL DATA: Loss of consciousness

EXAM:
PORTABLE CHEST 1 VIEW

[Series 1: chest · 0.14mm/px · 2 of 2 slices shown]
[im 1/2]
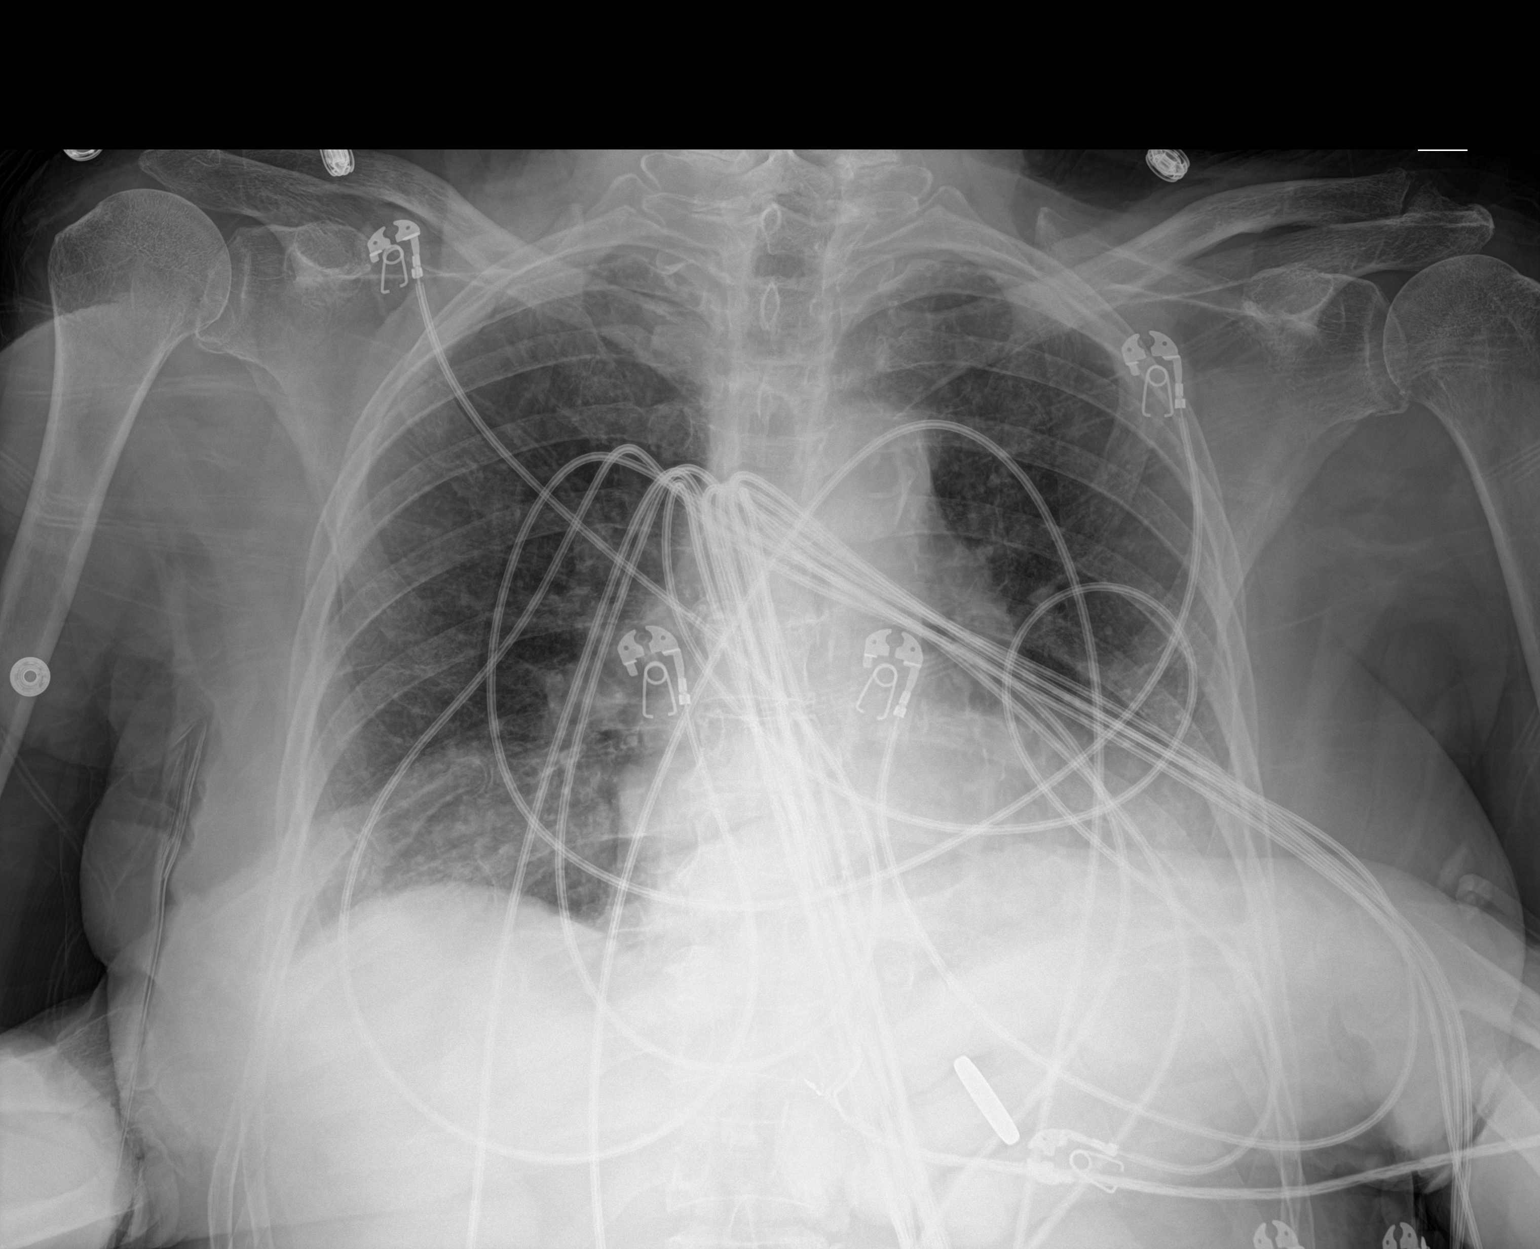
[im 2/2]
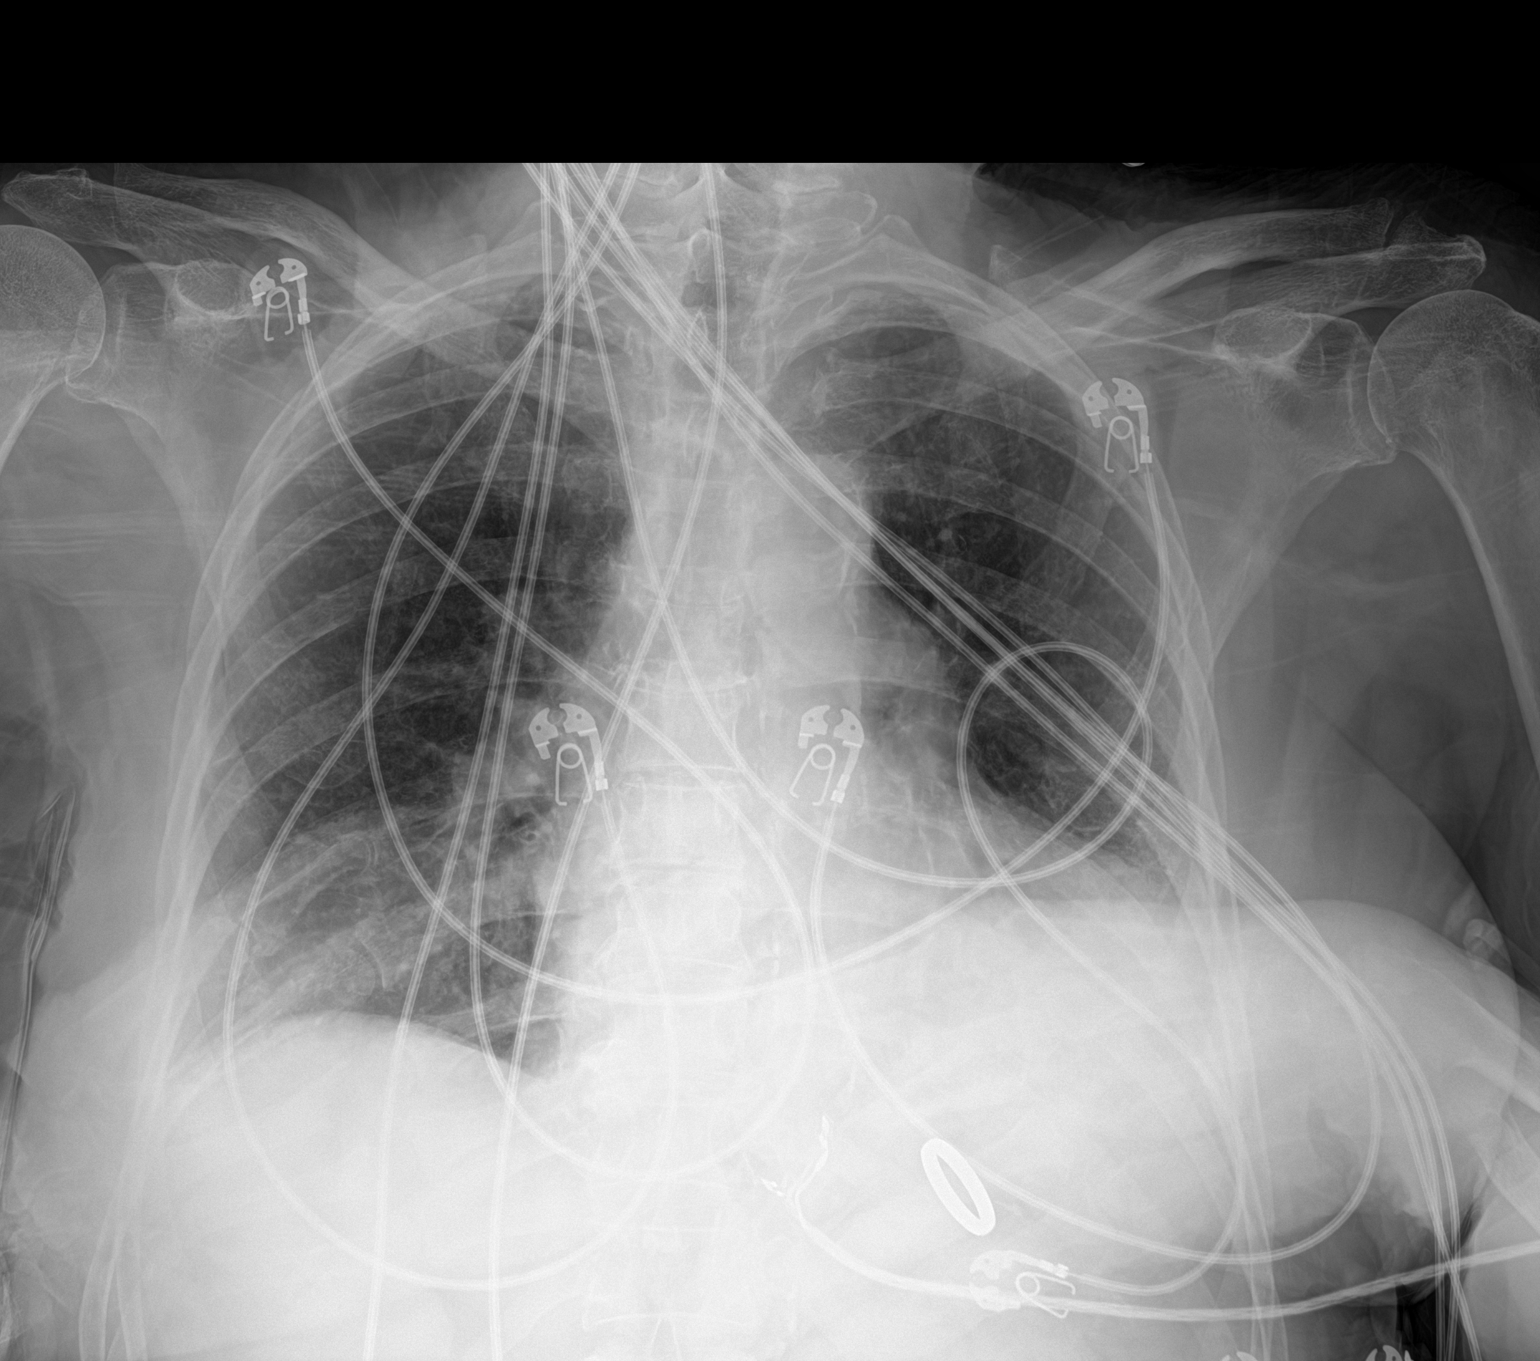

[2 of 2 positions shown; findings below may reference images not displayed]

FINDINGS: Multiple leads overlie the patient. The patient's hands overlie the
lung bases. Low lung volumes. Lungs grossly clear. No significant
pleural effusion. No pneumothorax. Cardiomediastinal contours are
likely within normal limits for portable technique.
IMPRESSION: No acute process the chest.

## 2023-06-27 ENCOUNTER — Telehealth: Payer: Self-pay | Admitting: Family Medicine

## 2023-06-27 NOTE — Telephone Encounter (Signed)
Patient has not been seen by current PCP in a year. Reached out to the patient to see if they have a new PCP or if they would like to continue care with the provider.
